# Patient Record
Sex: Male | Born: 1935 | Race: White | Hispanic: No | Marital: Married | Smoking: Former smoker
Health system: Southern US, Community
[De-identification: ages and names within clinical notes are randomized; demographics above are authoritative.]

## PROBLEM LIST (undated history)

## (undated) DIAGNOSIS — M1990 Unspecified osteoarthritis, unspecified site: Secondary | ICD-10-CM

## (undated) DIAGNOSIS — K219 Gastro-esophageal reflux disease without esophagitis: Secondary | ICD-10-CM

## (undated) DIAGNOSIS — Z950 Presence of cardiac pacemaker: Secondary | ICD-10-CM

## (undated) DIAGNOSIS — J449 Chronic obstructive pulmonary disease, unspecified: Secondary | ICD-10-CM

## (undated) DIAGNOSIS — I2510 Atherosclerotic heart disease of native coronary artery without angina pectoris: Secondary | ICD-10-CM

## (undated) DIAGNOSIS — N400 Enlarged prostate without lower urinary tract symptoms: Secondary | ICD-10-CM

## (undated) DIAGNOSIS — D509 Iron deficiency anemia, unspecified: Secondary | ICD-10-CM

## (undated) DIAGNOSIS — I472 Ventricular tachycardia: Secondary | ICD-10-CM

## (undated) DIAGNOSIS — I739 Peripheral vascular disease, unspecified: Secondary | ICD-10-CM

## (undated) DIAGNOSIS — I5022 Chronic systolic (congestive) heart failure: Secondary | ICD-10-CM

## (undated) DIAGNOSIS — I4892 Unspecified atrial flutter: Secondary | ICD-10-CM

## (undated) DIAGNOSIS — N189 Chronic kidney disease, unspecified: Secondary | ICD-10-CM

## (undated) DIAGNOSIS — I219 Acute myocardial infarction, unspecified: Secondary | ICD-10-CM

## (undated) DIAGNOSIS — I10 Essential (primary) hypertension: Secondary | ICD-10-CM

## (undated) DIAGNOSIS — Z86010 Personal history of colonic polyps: Secondary | ICD-10-CM

## (undated) DIAGNOSIS — I4891 Unspecified atrial fibrillation: Secondary | ICD-10-CM

## (undated) DIAGNOSIS — I4729 Other ventricular tachycardia: Secondary | ICD-10-CM

## (undated) DIAGNOSIS — E785 Hyperlipidemia, unspecified: Secondary | ICD-10-CM

## (undated) DIAGNOSIS — R0602 Shortness of breath: Secondary | ICD-10-CM

## (undated) DIAGNOSIS — Z860101 Personal history of adenomatous and serrated colon polyps: Secondary | ICD-10-CM

## (undated) DIAGNOSIS — Z9889 Other specified postprocedural states: Secondary | ICD-10-CM

## (undated) HISTORY — DX: Essential (primary) hypertension: I10

## (undated) HISTORY — DX: Iron deficiency anemia, unspecified: D50.9

## (undated) HISTORY — DX: Chronic obstructive pulmonary disease, unspecified: J44.9

## (undated) HISTORY — DX: Benign prostatic hyperplasia without lower urinary tract symptoms: N40.0

## (undated) HISTORY — DX: Unspecified atrial fibrillation: I48.91

## (undated) HISTORY — DX: Unspecified atrial flutter: I48.92

## (undated) HISTORY — DX: Peripheral vascular disease, unspecified: I73.9

## (undated) HISTORY — DX: Other specified postprocedural states: Z98.890

## (undated) HISTORY — DX: Presence of cardiac pacemaker: Z95.0

## (undated) HISTORY — DX: Shortness of breath: R06.02

## (undated) HISTORY — DX: Hyperlipidemia, unspecified: E78.5

## (undated) HISTORY — PX: CATARACT EXTRACTION: SUR2

## (undated) HISTORY — PX: SALIVARY GLAND SURGERY: SHX768

## (undated) HISTORY — DX: Gastro-esophageal reflux disease without esophagitis: K21.9

## (undated) HISTORY — DX: Personal history of adenomatous and serrated colon polyps: Z86.0101

## (undated) HISTORY — DX: Chronic systolic (congestive) heart failure: I50.22

## (undated) HISTORY — PX: CORONARY ANGIOPLASTY WITH STENT PLACEMENT: SHX49

## (undated) HISTORY — DX: Atherosclerotic heart disease of native coronary artery without angina pectoris: I25.10

## (undated) HISTORY — DX: Acute myocardial infarction, unspecified: I21.9

## (undated) HISTORY — DX: Personal history of colonic polyps: Z86.010

---

## 1992-04-05 DIAGNOSIS — I219 Acute myocardial infarction, unspecified: Secondary | ICD-10-CM

## 1992-04-05 HISTORY — DX: Acute myocardial infarction, unspecified: I21.9

## 1997-07-04 ENCOUNTER — Inpatient Hospital Stay (HOSPITAL_COMMUNITY): Admission: EM | Admit: 1997-07-04 | Discharge: 1997-07-07 | Payer: Self-pay | Admitting: Cardiology

## 1997-09-25 ENCOUNTER — Other Ambulatory Visit: Admission: RE | Admit: 1997-09-25 | Discharge: 1997-09-25 | Payer: Self-pay | Admitting: Cardiology

## 1998-07-02 ENCOUNTER — Ambulatory Visit (HOSPITAL_COMMUNITY): Admission: RE | Admit: 1998-07-02 | Discharge: 1998-07-02 | Payer: Self-pay | Admitting: Cardiology

## 1998-09-23 ENCOUNTER — Encounter: Payer: Self-pay | Admitting: Emergency Medicine

## 1998-09-23 ENCOUNTER — Emergency Department (HOSPITAL_COMMUNITY): Admission: EM | Admit: 1998-09-23 | Discharge: 1998-09-23 | Payer: Self-pay | Admitting: Emergency Medicine

## 1999-01-05 ENCOUNTER — Inpatient Hospital Stay (HOSPITAL_COMMUNITY): Admission: AD | Admit: 1999-01-05 | Discharge: 1999-01-08 | Payer: Self-pay | Admitting: Cardiology

## 1999-01-05 ENCOUNTER — Encounter: Payer: Self-pay | Admitting: Cardiology

## 1999-01-27 ENCOUNTER — Encounter: Payer: Self-pay | Admitting: Cardiology

## 1999-01-27 ENCOUNTER — Encounter: Admission: RE | Admit: 1999-01-27 | Discharge: 1999-01-27 | Payer: Self-pay | Admitting: Cardiology

## 1999-01-31 ENCOUNTER — Emergency Department (HOSPITAL_COMMUNITY): Admission: EM | Admit: 1999-01-31 | Discharge: 1999-01-31 | Payer: Self-pay | Admitting: Emergency Medicine

## 1999-01-31 ENCOUNTER — Encounter: Payer: Self-pay | Admitting: Emergency Medicine

## 1999-02-04 ENCOUNTER — Ambulatory Visit (HOSPITAL_COMMUNITY): Admission: RE | Admit: 1999-02-04 | Discharge: 1999-02-04 | Payer: Self-pay | Admitting: Cardiology

## 2000-01-21 ENCOUNTER — Other Ambulatory Visit: Admission: RE | Admit: 2000-01-21 | Discharge: 2000-01-21 | Payer: Self-pay | Admitting: Otolaryngology

## 2001-06-29 ENCOUNTER — Emergency Department (HOSPITAL_COMMUNITY): Admission: EM | Admit: 2001-06-29 | Discharge: 2001-06-30 | Payer: Self-pay

## 2002-02-13 ENCOUNTER — Encounter: Payer: Self-pay | Admitting: Gastroenterology

## 2002-03-23 ENCOUNTER — Encounter: Payer: Self-pay | Admitting: Emergency Medicine

## 2002-03-23 ENCOUNTER — Encounter: Admission: RE | Admit: 2002-03-23 | Discharge: 2002-03-23 | Payer: Self-pay | Admitting: Emergency Medicine

## 2002-05-21 ENCOUNTER — Encounter: Payer: Self-pay | Admitting: Emergency Medicine

## 2002-05-21 ENCOUNTER — Ambulatory Visit (HOSPITAL_COMMUNITY): Admission: RE | Admit: 2002-05-21 | Discharge: 2002-05-21 | Payer: Self-pay | Admitting: Emergency Medicine

## 2002-08-13 ENCOUNTER — Encounter: Payer: Self-pay | Admitting: Gastroenterology

## 2002-08-17 ENCOUNTER — Emergency Department (HOSPITAL_COMMUNITY): Admission: EM | Admit: 2002-08-17 | Discharge: 2002-08-18 | Payer: Self-pay | Admitting: Emergency Medicine

## 2002-08-18 ENCOUNTER — Encounter: Payer: Self-pay | Admitting: Emergency Medicine

## 2003-03-12 ENCOUNTER — Encounter (INDEPENDENT_AMBULATORY_CARE_PROVIDER_SITE_OTHER): Payer: Self-pay | Admitting: *Deleted

## 2003-03-12 ENCOUNTER — Encounter: Admission: RE | Admit: 2003-03-12 | Discharge: 2003-03-12 | Payer: Self-pay | Admitting: General Surgery

## 2005-02-17 ENCOUNTER — Ambulatory Visit: Payer: Self-pay | Admitting: Gastroenterology

## 2005-04-15 ENCOUNTER — Ambulatory Visit: Payer: Self-pay | Admitting: Gastroenterology

## 2005-04-30 ENCOUNTER — Ambulatory Visit: Payer: Self-pay | Admitting: Gastroenterology

## 2005-04-30 ENCOUNTER — Encounter (INDEPENDENT_AMBULATORY_CARE_PROVIDER_SITE_OTHER): Payer: Self-pay | Admitting: Specialist

## 2005-05-04 ENCOUNTER — Inpatient Hospital Stay (HOSPITAL_COMMUNITY): Admission: EM | Admit: 2005-05-04 | Discharge: 2005-05-08 | Payer: Self-pay | Admitting: Emergency Medicine

## 2006-03-21 ENCOUNTER — Ambulatory Visit: Payer: Self-pay | Admitting: Gastroenterology

## 2006-04-05 HISTORY — PX: CARDIAC CATHETERIZATION: SHX172

## 2006-11-18 ENCOUNTER — Inpatient Hospital Stay (HOSPITAL_COMMUNITY): Admission: EM | Admit: 2006-11-18 | Discharge: 2006-11-21 | Payer: Self-pay | Admitting: Emergency Medicine

## 2006-11-19 ENCOUNTER — Encounter (INDEPENDENT_AMBULATORY_CARE_PROVIDER_SITE_OTHER): Payer: Self-pay | Admitting: *Deleted

## 2006-11-20 ENCOUNTER — Encounter (INDEPENDENT_AMBULATORY_CARE_PROVIDER_SITE_OTHER): Payer: Self-pay | Admitting: *Deleted

## 2006-11-30 ENCOUNTER — Ambulatory Visit: Payer: Self-pay | Admitting: Internal Medicine

## 2006-12-22 ENCOUNTER — Encounter: Admission: RE | Admit: 2006-12-22 | Discharge: 2006-12-22 | Payer: Self-pay | Admitting: Cardiology

## 2007-03-15 ENCOUNTER — Ambulatory Visit: Payer: Self-pay | Admitting: Gastroenterology

## 2007-03-25 ENCOUNTER — Emergency Department (HOSPITAL_COMMUNITY): Admission: EM | Admit: 2007-03-25 | Discharge: 2007-03-26 | Payer: Self-pay | Admitting: Emergency Medicine

## 2007-03-27 ENCOUNTER — Encounter: Admission: RE | Admit: 2007-03-27 | Discharge: 2007-03-27 | Payer: Self-pay | Admitting: Cardiology

## 2007-03-29 ENCOUNTER — Emergency Department (HOSPITAL_COMMUNITY): Admission: EM | Admit: 2007-03-29 | Discharge: 2007-03-29 | Payer: Self-pay | Admitting: Family Medicine

## 2007-04-04 DIAGNOSIS — Z860100 Personal history of colon polyps, unspecified: Secondary | ICD-10-CM | POA: Insufficient documentation

## 2007-04-04 DIAGNOSIS — K219 Gastro-esophageal reflux disease without esophagitis: Secondary | ICD-10-CM

## 2007-04-05 ENCOUNTER — Emergency Department (HOSPITAL_COMMUNITY): Admission: EM | Admit: 2007-04-05 | Discharge: 2007-04-05 | Payer: Self-pay | Admitting: Emergency Medicine

## 2007-09-11 ENCOUNTER — Encounter: Admission: RE | Admit: 2007-09-11 | Discharge: 2007-09-11 | Payer: Self-pay | Admitting: Cardiology

## 2008-04-09 ENCOUNTER — Emergency Department (HOSPITAL_COMMUNITY): Admission: EM | Admit: 2008-04-09 | Discharge: 2008-04-09 | Payer: Self-pay | Admitting: Emergency Medicine

## 2008-07-24 ENCOUNTER — Encounter: Admission: RE | Admit: 2008-07-24 | Discharge: 2008-07-24 | Payer: Self-pay | Admitting: Neurological Surgery

## 2008-08-31 ENCOUNTER — Ambulatory Visit: Payer: Self-pay | Admitting: Pulmonary Disease

## 2008-08-31 ENCOUNTER — Ambulatory Visit: Payer: Self-pay | Admitting: Cardiovascular Disease

## 2008-08-31 ENCOUNTER — Ambulatory Visit: Payer: Self-pay | Admitting: Family Medicine

## 2008-08-31 ENCOUNTER — Inpatient Hospital Stay (HOSPITAL_COMMUNITY): Admission: EM | Admit: 2008-08-31 | Discharge: 2008-09-04 | Payer: Self-pay | Admitting: Emergency Medicine

## 2008-09-03 ENCOUNTER — Encounter: Payer: Self-pay | Admitting: Family Medicine

## 2008-10-30 ENCOUNTER — Encounter: Admission: RE | Admit: 2008-10-30 | Discharge: 2008-10-30 | Payer: Self-pay | Admitting: Emergency Medicine

## 2009-01-10 ENCOUNTER — Encounter: Admission: RE | Admit: 2009-01-10 | Discharge: 2009-01-10 | Payer: Self-pay | Admitting: Family Medicine

## 2009-03-31 ENCOUNTER — Encounter: Admission: RE | Admit: 2009-03-31 | Discharge: 2009-03-31 | Payer: Self-pay | Admitting: Cardiology

## 2009-05-05 ENCOUNTER — Encounter (INDEPENDENT_AMBULATORY_CARE_PROVIDER_SITE_OTHER): Payer: Self-pay | Admitting: *Deleted

## 2009-05-05 ENCOUNTER — Ambulatory Visit: Payer: Self-pay | Admitting: Gastroenterology

## 2009-05-05 DIAGNOSIS — D649 Anemia, unspecified: Secondary | ICD-10-CM

## 2009-05-05 LAB — CONVERTED CEMR LAB
Ferritin: 62.7 ng/mL (ref 22.0–322.0)
Folate: >20 ng/mL
Transferrin: 149.2 mg/dL — ABNORMAL LOW (ref 212.0–360.0)

## 2009-05-06 ENCOUNTER — Encounter: Payer: Self-pay | Admitting: Gastroenterology

## 2009-05-19 ENCOUNTER — Ambulatory Visit: Payer: Self-pay | Admitting: Gastroenterology

## 2009-05-19 ENCOUNTER — Other Ambulatory Visit: Admission: RE | Admit: 2009-05-19 | Discharge: 2009-05-19 | Payer: Self-pay | Admitting: Gastroenterology

## 2009-05-22 ENCOUNTER — Encounter: Payer: Self-pay | Admitting: Gastroenterology

## 2009-05-28 ENCOUNTER — Ambulatory Visit: Payer: Self-pay | Admitting: Gastroenterology

## 2009-05-30 LAB — CONVERTED CEMR LAB
OCCULT 1: NEGATIVE
OCCULT 3: NEGATIVE
OCCULT 5: NEGATIVE

## 2009-10-30 ENCOUNTER — Ambulatory Visit: Payer: Self-pay | Admitting: Vascular Surgery

## 2009-10-30 ENCOUNTER — Emergency Department (HOSPITAL_COMMUNITY): Admission: EM | Admit: 2009-10-30 | Discharge: 2009-10-30 | Payer: Self-pay | Admitting: Emergency Medicine

## 2009-10-30 ENCOUNTER — Encounter (INDEPENDENT_AMBULATORY_CARE_PROVIDER_SITE_OTHER): Payer: Self-pay | Admitting: Emergency Medicine

## 2009-11-28 ENCOUNTER — Encounter: Payer: Self-pay | Admitting: Internal Medicine

## 2009-12-02 ENCOUNTER — Ambulatory Visit: Payer: Self-pay | Admitting: Internal Medicine

## 2009-12-02 DIAGNOSIS — R0600 Dyspnea, unspecified: Secondary | ICD-10-CM | POA: Insufficient documentation

## 2009-12-02 DIAGNOSIS — J4489 Other specified chronic obstructive pulmonary disease: Secondary | ICD-10-CM | POA: Insufficient documentation

## 2009-12-02 DIAGNOSIS — J449 Chronic obstructive pulmonary disease, unspecified: Secondary | ICD-10-CM

## 2009-12-02 DIAGNOSIS — Z862 Personal history of diseases of the blood and blood-forming organs and certain disorders involving the immune mechanism: Secondary | ICD-10-CM

## 2009-12-02 DIAGNOSIS — Z8639 Personal history of other endocrine, nutritional and metabolic disease: Secondary | ICD-10-CM

## 2009-12-02 LAB — CONVERTED CEMR LAB
AST: 249 units/L — ABNORMAL HIGH (ref 0–37)
BUN: 17 mg/dL (ref 6–23)
Basophils Absolute: 0 10*3/uL (ref 0.0–0.1)
Basophils Relative: 0.4 % (ref 0.0–3.0)
CO2: 27 meq/L (ref 19–32)
Calcium: 8.5 mg/dL (ref 8.4–10.5)
Chloride: 101 meq/L (ref 96–112)
Eosinophils Absolute: 2.6 10*3/uL — ABNORMAL HIGH (ref 0.0–0.7)
Glucose, Bld: 86 mg/dL (ref 70–99)
Hemoglobin: 11.4 g/dL — ABNORMAL LOW (ref 13.0–17.0)
Lymphocytes Relative: 7.3 % — ABNORMAL LOW (ref 12.0–46.0)
Monocytes Absolute: 0.9 10*3/uL (ref 0.1–1.0)
Neutrophils Relative %: 58.2 % (ref 43.0–77.0)
Platelets: 287 10*3/uL (ref 150.0–400.0)
Potassium: 4.8 meq/L (ref 3.5–5.1)
Pro B Natriuretic peptide (BNP): 251.6 pg/mL — ABNORMAL HIGH (ref 0.0–100.0)
Sed Rate: 74 mm/hr — ABNORMAL HIGH (ref 0–22)
TSH: 3.76 microintl units/mL (ref 0.35–5.50)
Total Bilirubin: 0.6 mg/dL (ref 0.3–1.2)
Total Protein: 6.2 g/dL (ref 6.0–8.3)
WBC: 10.1 10*3/uL (ref 4.5–10.5)

## 2009-12-04 ENCOUNTER — Encounter: Admission: RE | Admit: 2009-12-04 | Discharge: 2009-12-04 | Payer: Self-pay | Admitting: Cardiology

## 2009-12-09 ENCOUNTER — Ambulatory Visit: Payer: Self-pay | Admitting: Cardiology

## 2009-12-11 ENCOUNTER — Ambulatory Visit: Payer: Self-pay | Admitting: Cardiology

## 2009-12-17 ENCOUNTER — Ambulatory Visit: Payer: Self-pay | Admitting: Cardiology

## 2009-12-18 ENCOUNTER — Ambulatory Visit (HOSPITAL_COMMUNITY): Admission: RE | Admit: 2009-12-18 | Discharge: 2009-12-18 | Payer: Self-pay | Admitting: Cardiology

## 2009-12-18 ENCOUNTER — Ambulatory Visit: Payer: Self-pay | Admitting: Cardiology

## 2009-12-29 ENCOUNTER — Ambulatory Visit: Payer: Self-pay | Admitting: Cardiology

## 2010-01-12 ENCOUNTER — Ambulatory Visit: Payer: Self-pay | Admitting: Internal Medicine

## 2010-01-12 DIAGNOSIS — R930 Abnormal findings on diagnostic imaging of skull and head, not elsewhere classified: Secondary | ICD-10-CM | POA: Insufficient documentation

## 2010-01-19 ENCOUNTER — Emergency Department (HOSPITAL_COMMUNITY): Admission: EM | Admit: 2010-01-19 | Discharge: 2010-01-20 | Payer: Self-pay | Admitting: Emergency Medicine

## 2010-02-10 ENCOUNTER — Ambulatory Visit: Payer: Self-pay | Admitting: Cardiology

## 2010-03-06 ENCOUNTER — Inpatient Hospital Stay (HOSPITAL_COMMUNITY)
Admission: EM | Admit: 2010-03-06 | Discharge: 2010-03-10 | Payer: Self-pay | Source: Home / Self Care | Admitting: Emergency Medicine

## 2010-03-06 ENCOUNTER — Ambulatory Visit: Payer: Self-pay | Admitting: Internal Medicine

## 2010-03-08 ENCOUNTER — Encounter: Payer: Self-pay | Admitting: Family Medicine

## 2010-03-09 ENCOUNTER — Encounter (INDEPENDENT_AMBULATORY_CARE_PROVIDER_SITE_OTHER): Payer: Self-pay | Admitting: Urology

## 2010-03-09 ENCOUNTER — Encounter: Payer: Self-pay | Admitting: Family Medicine

## 2010-03-13 ENCOUNTER — Ambulatory Visit: Payer: Self-pay | Admitting: Cardiovascular Disease

## 2010-04-24 ENCOUNTER — Ambulatory Visit
Admission: RE | Admit: 2010-04-24 | Discharge: 2010-04-24 | Payer: Self-pay | Source: Home / Self Care | Attending: Internal Medicine | Admitting: Internal Medicine

## 2010-04-30 ENCOUNTER — Ambulatory Visit: Payer: Self-pay | Admitting: Cardiovascular Disease

## 2010-05-05 ENCOUNTER — Encounter: Payer: Self-pay | Admitting: Cardiology

## 2010-05-05 NOTE — Miscellaneous (Signed)
Summary: Orders Update pft charges  Clinical Lists Changes  Orders: Added new Service order of Carbon Monoxide diffusing w/capacity (94720) - Signed Added new Service order of Lung Volumes (94240) - Signed Added new Service order of Spirometry (Pre & Post) (94060) - Signed 

## 2010-05-05 NOTE — Assessment & Plan Note (Signed)
Summary: Pulmnary/ ext summary f/u ov with HFA 75% p coaching   Copy to:  Dr. Nadyne Coombes Primary Provider/Referring Provider:  Nadyne Coombes, MD  CC:  Follow up with PFTs.  Pt was seen by PCP approx 3 wk ago - was told he had "early stages of PNA" and was given steroid and abx.  States breathing "comes and goes."  prod cough with yellow mucus.  .  History of Present Illness: 77 yowm quit smoking October 2010 at wt= around 200  with some am cough and congestion x 5-10 min maybe a little better after inhalers but breathing gradually worse since quit smoking.   December 02, 2009 cc doe x one month much worse to point where needs to stop when gets back to house to mailbox also with increase leg swelling so increase furosemide to 40 and 20 at night > no better.  no cough. LFT's and esr were elevated so rx with advair and dc amio  January 12, 2010 Follow up with PFTs.  Pt was seen by PCP approx 3 wk ago - was told he had "early stages of PNA" and was given steroid and abx.  States breathing "comes and goes."  prod cough with yellow mucus.   overally breathing better but feels he's on too many meds.  Pt denies any significant sore throat, dysphagia, itching, sneezing,  nasal congestion or excess secretions,  fever, chills, sweats, unintended wt loss, pleuritic or exertional cp, hempoptysis,   orthopnea pnd or leg swelling. Pt also denies any obvious fluctuation in symptoms with weather or environmental change or other alleviating or aggravating factors.       Current Medications (verified): 1)  Advair Diskus 250-50 Mcg/dose Aepb (Fluticasone-Salmeterol) .... Take 1 Puff By Mouth Two Times A Day 2)  Nexium 40 Mg Cpdr (Esomeprazole Magnesium) .... Take 1 Tablet By Mouth Two Times A Day 3)  Aspirin 81 Mg Tbec (Aspirin) .... Take 1 Tablet By Mouth Once A Day 4)  Plavix 75 Mg Tabs (Clopidogrel Bisulfate) .... Take 1 Tablet By Mouth Every Other Day 5)  Flomax 0.4 Mg Caps (Tamsulosin Hcl) .... Take 1 Tablet  By Mouth Once Daily 6)  Furosemide 40 Mg Tabs (Furosemide) .[name].. 1 in Morning and 1/2 At Night 7)  Prenavite Multiple Vitamin 28-0.8 Mg Tabs (Prenatal Vit-Fe Fumarate-Fa) .... Take 1 Tablet By Mouth Once A Day 8)  Alprazolam 0.25 Mg Tabs (Alprazolam) .... Take 1 Tablet By Mouth As Needed 9)  Milk of Magnesia (Unknown Dosage) .... Take 3 Tablespoons By Mouth Twice Per Week As Needed 10)  Gas-X Extra Strength 125 Mg Caps (Simethicone) .... Take As Needed For Gas 11)  Vicodin 5-500 Mg Tabs (Hydrocodone-Acetaminophen) .... Take 1 Tablet By Mouth Once A Day 12)  Tylenol Arthritis Pain 650 Mg Cr-Tabs (Acetaminophen) .... Take 2 Tablets By Mouth Once Daily As Needed 13)  Proair Hfa 108 (90 Base) Mcg/act Aers (Albuterol Sulfate) .... 2 Puffs Every 4-6 Hrs As Needed 14)  Albuterol Sulfate (2.5 Mg/79ml) 0.083% Nebu (Albuterol Sulfate) .... Four Times Daily 15)  Atrovent 0.03 % Soln (Ipratropium Bromide) .... Four Times Daily 16)  Diovan 160 Mg Tabs (Valsartan) .... Take 1 Tablet By Mouth Once A Day  Allergies (verified): 1)  ! Sulfa  Past History:  Past Medical History: GERD (ICD-530.81) w/ LPR COPD...................................................Marland KitchenWert      - PFT's January 12, 2010 FEV1  1.27 (44%) and ratio 58 no better p b2 and DLCO 43%       -  HFA 75% p coaching January 12, 2010  Ischemic heart disease Myocardial Infarction x 2 Atrial Fibrillation     - On amiodarone December 02, 2009 with ? ILD and ESR 74 >> stop amiodarone 12/2009 Degenerative joint disease Hemorrhoids Hyperlipidemia Hypertension Arrhythmia Adenomatous Colon Polyps 02/2002  Vital Signs:  Patient profile:   77 year old male Height:      72 inches Weight:      203 pounds BMI:     27.63 O2 Sat:      93 % on Room air Temp:     98.0 degrees F oral Pulse rate:   76 / minute BP sitting:   128 / 60  (right arm) Cuff size:   regular  Vitals Entered By: Gweneth Dimitri RN (January 12, 2010 11:41 AM)  O2 Flow:  Room  air CC: Follow up with PFTs.  Pt was seen by PCP approx 3 wk ago - was told he had "early stages of PNA" and was given steroid and abx.  States breathing "comes and goes."  prod cough with yellow mucus.   Comments Medications reviewed with patient Daytime contact number verified with patient. Gweneth Dimitri RN  January 12, 2010 11:41 AM    Physical Exam  Additional Exam:  wt 205 > 207  December 02, 2009 > 203 January 12, 2010  amb mod bareel  chest wm nad at rest HEENT mild turbinate edema.  Oropharynx no thrush or excess pnd or cobblestoning.  No JVD or cervical adenopathy. Mild accessory muscle hypertrophy. Trachea midline, nl thryroid. Chest was hyperinflated by percussion with diminished breath sounds and moderate increased exp time without wheeze. Hoover sign positive at mid inspiration. Regular rate and rhythm without murmur gallop or rub or increase P2, trace edema.  Abd: no hsm, nl excursion. Ext warm without cyanosis or clubbing.     Impression & Recommendations:  Problem # 1:  COPD UNSPECIFIED (ICD-496) GOLD  IV with freq exac on advair    DDX of  difficult airways managment all start with A and  include Adherence, Ace Inhibitors, Acid Reflux, Active Sinus Disease, Alpha 1 Antitripsin deficiency, Anxiety masquerading as Airways dz,  ABPA,  allergy(esp in young), Aspiration (esp in elderly), Adverse effects of DPI,  Active smokers, plus one B  = Beta blocker use..    ? adverse effect of dpi;  try symbicort 160 2 puffs first thing  in am and 2 puffs again in pm about 12 hours later ? GERD  reviewed ppi/ diet ? adherence: I spent extra time with the patient today explaining optimal mdi  technique.  This improved from  50-75%   Each maintenance medication was reviewed in detail including most importantly the difference between maintenance prns and under what circumstances the prns are to be used.  In addition, these two groups (for which the patient should keep up with refills) were  distinguished from a third group :  meds that are used only short term with the intent to complete a course of therapy and then not refill them.  The med list was then fully reconciled and reorganized to reflect this important distinction.   Problem # 2:  ABNORMAL LUNG XRAY (ICD-793.1)  CXR c/w mild amio toxicity and now off it - recheck next ov  Orders: Est. Patient Level IV (98119)  Medications Added to Medication List This Visit: 1)  Symbicort 160-4.5 Mcg/act Aero (Budesonide-formoterol fumarate) .... 2 puffs first thing  in am and 2 puffs again in pm  about 12 hours later 2)  Nexium 40 Mg Cpdr (Esomeprazole magnesium) .... Take one 30-60 min before first and last meals of the day 3)  Diovan 160 Mg Tabs (Valsartan) .... Take 1 tablet by mouth once a day 4)  Furosemide 40 Mg Tabs (Furosemide) .[name].. 1 in morning and 1/2 at night 5)  Albuterol Sulfate (2.5 Mg/75ml) 0.083% Nebu (Albuterol sulfate) .... Four times daily 6)  Atrovent 0.03 % Soln (Ipratropium bromide) .... Four times daily  Patient Instructions: 1)  stop advair 2)  Start symbicort 160 2 puffs first thing  in am and 2 puffs again in pm about 12 hours later  3)  Work on inhaler technique:  relax and blow all the way out then take a nice smooth deep breath back in, triggering the inhaler at same time you start breathing in hold for a few seconds then rinse and gargle. 4)  If you like symbicort fill  the prescription - you should notice less need for the other breathing treatments which can be used as neeed 5)   Think of your medications in 3 separate categories and keep them separate:  6)  a)  The ones you take no matter what daily on a scheduled basis according the time a day 7)  b) The ones you only take if needed for specific problems 8)  c) The ones you take for a short course and stop, like antibiotics and prednisone. 9)  Return to office in 3 months, sooner if needed  10)  ADD needs cxr next ov 11)     Prescriptions: SYMBICORT 160-4.5 MCG/ACT  AERO (BUDESONIDE-FORMOTEROL FUMARATE) 2 puffs first thing  in am and 2 puffs again in pm about 12 hours later  #3 x 3   Entered and Authorized by:   Nyoka Cowden MD   Signed by:   Nyoka Cowden MD on 01/12/2010   Method used:   Print then Give to Patient   RxID:   [id number]

## 2010-05-05 NOTE — Letter (Signed)
Summary: [facility] Instructions  [facility]  [address] [place], [place] [address]   Phone: [phone]  Fax: [phone]       BRYCE CHEEVER    May 24, 1930    MRN: [id number]        Procedure Day /Date: Monday February 14th, 2011     Arrival Time: 1:30pm     Procedure Time: 2:30pm     Location of Procedure:                    _ x_  [facility] (4th Floor)                        PREPARATION FOR COLONOSCOPY WITH MOVIPREP   Starting 5 days prior to your procedure 05/14/09  do not eat nuts, seeds, popcorn, corn, beans, peas,  salads, or any raw vegetables.  Do not take any fiber supplements (e.g. Metamucil, Citrucel, and Benefiber).  THE DAY BEFORE YOUR PROCEDURE         DATE:  05/18/09   DAY:   Sunday  1.  Drink clear liquids the entire day-NO SOLID FOOD  2.  Do not drink anything colored red or purple.  Avoid juices with pulp.  No orange juice.  3.  Drink at least 64 oz. (8 glasses) of fluid/clear liquids during the day to prevent dehydration and help the prep work efficiently.  CLEAR LIQUIDS INCLUDE: Water Jello Ice Popsicles Tea (sugar ok, no milk/cream) Powdered fruit flavored drinks Coffee (sugar ok, no milk/cream) Gatorade Juice: apple, white grape, white cranberry  Lemonade Clear bullion, consomm, broth Carbonated beverages (any kind) Strained chicken noodle soup Hard Candy                             4.  In the morning, mix first dose of MoviPrep solution:    Empty 1 Pouch A and 1 Pouch B into the disposable container    Add lukewarm drinking water to the top line of the container. Mix to dissolve    Refrigerate (mixed solution should be used within 24 hrs)  5.  Begin drinking the prep at 5:00 p.m. The MoviPrep container is divided by 4 marks.   Every 15 minutes drink the solution down to the next mark (approximately 8 oz) until the full liter is complete.   6.  Follow completed prep with 16 oz of clear liquid of your  choice (Nothing red or purple).  Continue to drink clear liquids until bedtime.  7.  Before going to bed, mix second dose of MoviPrep solution:    Empty 1 Pouch A and 1 Pouch B into the disposable container    Add lukewarm drinking water to the top line of the container. Mix to dissolve    Refrigerate  THE DAY OF YOUR PROCEDURE      DATE:  05/19/09  DAY:  Monday  Beginning at  9:30 a.m. (5 hours before procedure):         1. Every 15 minutes, drink the solution down to the next mark (approx 8 oz) until the full liter is complete.  2. Follow completed prep with 16 oz. of clear liquid of your choice.    3. You may drink clear liquids until   12:30pm (2 HOURS BEFORE PROCEDURE).   MEDICATION INSTRUCTIONS  Unless otherwise instructed, you should take regular prescription medications with a small sip of  water   as early as possible the morning of your procedure.  Diabetic patients - see separate instructions.  Stop taking Plavix or Aggrenox on  _  _  (7 days before procedure).     Stop taking Coumadin on  _ _  (5 days before procedure).  Additional medication instructions: You will be contacted by our office prior to your procedure for directions on holding your Plavix.  If you do not hear from our office 1 week prior to your scheduled procedure, please call [phone] to discuss.          OTHER INSTRUCTIONS  You will need a responsible adult at least 74 years of age to accompany you and drive you home.   This person must remain in the waiting room during your procedure.  Wear loose fitting clothing that is easily removed.  Leave jewelry and other valuables at home.  However, you may wish to bring a book to read or  an iPod/MP3 player to listen to music as you wait for your procedure to start.  Remove all body piercing jewelry and leave at home.  Total time from sign-in until discharge is approximately 2-3 hours.  You should go home directly after your procedure and  rest.  You can resume normal activities the  day after your procedure.  The day of your procedure you should not:   Drive   Make legal decisions   Operate machinery   Drink alcohol   Return to work  You will receive specific instructions about eating, activities and medications before you leave.    The above instructions have been reviewed and explained to me by   _______________________    I fully understand and can verbalize these instructions _____________________________ Date _________

## 2010-05-05 NOTE — Procedures (Signed)
Summary: HOLD/[facility]  HOLD/[facility]   Imported By: Lester Belleair 05/12/2009 07:17:45  _____________________________________________________________________  External Attachment:    Type:   Image     Comment:   External Document

## 2010-05-05 NOTE — Procedures (Signed)
Summary: Colonoscopy   Colonoscopy  Procedure date:  02/13/2002  Findings:      Results: Hemorrhoids.     Pathology:  Adenomatous polyp.        Location:  [facility].    Procedures Next Due Date:    Colonoscopy: 02/2005  Colonoscopy  Procedure date:  02/13/2002  Findings:      Results: Hemorrhoids.     Pathology:  Adenomatous polyp.        Location:  [facility].    Procedures Next Due Date:    Colonoscopy: 02/2005 Patient Name: Andrew [name], Andrew [name]. MRN:  Procedure Procedures: Colonoscopy CPT: 2390621056.    with Hot Biopsy(s)CPT: [id number].  Personnel: Endoscopist: Venita Lick. Russella Dar, MD, Clementeen Graham.  Exam Location: Exam performed in Outpatient Clinic. Outpatient  Patient Consent: Procedure, Alternatives, Risks and Benefits discussed, consent obtained, from patient. Consent was obtained by the RN.  Indications Symptoms: Constipation Hematochezia. Abdominal pain / bloating. Change in bowel habits.  History  Pre-Exam Physical: Performed Feb 13, 2002. Entire physical exam was normal.  Exam Exam: Extent of exam reached: Cecum, extent intended: Cecum.  The cecum was identified by appendiceal orifice and IC valve. Colon retroflexion performed. ASA Classification: III. Tolerance: good.  Monitoring: Pulse and BP monitoring, Oximetry used. Supplemental O2 given.  Colon Prep Used Golytely for colon prep. Prep results: good.  Sedation Meds: Patient assessed and found to be appropriate for moderate (conscious) sedation. Fentanyl 100 mcg. given IV. Versed 8 mg. given IV.  Comments: tortuous colon with spasm-moderately difficult procedure Findings NORMAL EXAM: Cecum to Transverse Colon.  MULTIPLE POLYPS: Splenic Flexure to Sigmoid Colon. minimum size 3 mm, maximum size 4 mm. Procedure:  hot biopsy, removed, Polyp retrieved, 5 polyps Polyps sent to pathology. ICD9: Liver Metastasis: 197.7.  HEMORRHOIDS: Internal. Size: Medium. Not bleeding. Not  thrombosed. ICD9: Hemorrhoids, Internal: 455.0.   Assessment  Diagnoses: 211.3: Colon Polyps.  455.0: Hemorrhoids, Internal.   Events  Unplanned Interventions: No intervention was required.  Unplanned Events: There were no complications. Plans  Post Exam Instructions: No aspirin or non-steroidal containing medications: 2 weeks.  Medication Plan: Await pathology. Continue current medications.  Patient Education: Patient given standard instructions for: Polyps. Hemorrhoids. Constipation. Disposition: After procedure patient sent to recovery. After recovery patient sent home.  Scheduling/Referral: Colonoscopy, to [facility] T. Russella Dar, MD, [facility], if polyp(s) adenomatous, around Feb 13, 2005.  Primary Care Provider,    This report was created from the original endoscopy report, which was reviewed and signed by the above listed endoscopist.    cc: Viviann Spare A. Cleta Alberts, MD

## 2010-05-05 NOTE — Procedures (Signed)
Summary: Upper Endoscopy  Patient: Andrew [name] Note: All result statuses are Final unless otherwise noted.  Tests: (1) Upper Endoscopy (EGD)   EGD Upper Endoscopy       DONE      [facility]     [name]     [place], [place]  [address]           ENDOSCOPY PROCEDURE REPORT           PATIENT:  Andrew [name], Andrew [name]  MR#:  [id number]     BIRTHDATE:  1930/10/29, 78 yrs. old  GENDER:  male           ENDOSCOPIST:  Judie Petit T. Russella Dar, MD, [facility]     Referred by:           PROCEDURE DATE:  05/19/2009     PROCEDURE:  EGD w/brushings     ASA CLASS:  Class II     INDICATIONS:  GERD, anemia           MEDICATIONS:  There was residual sedation effect present from     prior procedure, Versed 2 mg IV     TOPICAL ANESTHETIC:  Exactacain Spray           DESCRIPTION OF PROCEDURE:   After the risks benefits and     alternatives of the procedure were thoroughly explained, informed     consent was obtained.  The LB GIF-H180 [id number] endoscope was     introduced through the mouth and advanced to the second portion of     the duodenum, without limitations.  The instrument was slowly     withdrawn as the mucosa was fully examined.     <<PROCEDUREIMAGES>>           Eexudate in the mid esophagus. It was white colored. A brushing     for cytology was obtained. R/O candida. The remainder of the     esophagus was normal. The stomach was entered and closely     examined. The pylorus, antrum, angularis, and lesser curvature     were well visualized, including a retroflexed view of the cardia     and fundus. The stomach wall was normally distensable. The scope     passed easily through the pylorus into the duodenum.  The duodenal     bulb was normal in appearance, as was the postbulbar duodenum.     Retroflexed views revealed no abnormalities.  The scope was then     withdrawn from the patient and the procedure completed.           COMPLICATIONS:  None           ENDOSCOPIC IMPRESSION:     1)  Exudate in the mid esophagus           RECOMMENDATIONS:     1) await pathology results     2) continue PPI     3) anti-reflux regimen           Syvanna Ciolino T. Russella Dar, MD, Clementeen Graham           CC:  Nadyne Coombes, MD           n.     Rosalie DoctorVenita Lick. Taquana Bartley at 05/19/2009 03:53 PM           Georgianne Fick, [id number]  Note: An exclamation mark (!) indicates a result that was not dispersed into the flowsheet. Document Creation Date: 05/19/2009 3:53 PM _______________________________________________________________________  (1) Order result status:  Final Collection or observation date-time: 05/19/2009 15:48 Requested date-time:  Receipt date-time:  Reported date-time:  Referring Physician:   Ordering Physician: Claudette Head [phone]) Specimen Source:  Source: Launa Grill Order Number: [id number] Lab site:

## 2010-05-05 NOTE — Letter (Signed)
Summary: Patient [name] Biopsy Results  [facility]  [address] [place], [place] [address]   Phone: [phone]  Fax: [phone]        May 22, 2009 MRN: [id number]    Andrew [name] [address] [place], [place]  [address]    Dear [name],  I am pleased to inform you that the brushings taken during your recent endoscopic examination showed candida. Please complete the antibiotic prescribed to treat this infection.  Continue with the treatment plan as outlined on the day of your      exam.  Please call us if you are having persistent problems or have questions about your condition that have not been fully answered at this time.  Sincerely,  Meryl Dare MD [facility]  This letter has been electronically signed by your physician.  Appended Document: Patient Notice-Endo Biopsy Results Letter mailed 2.18.11

## 2010-05-05 NOTE — Procedures (Signed)
Summary: Colonoscopy  Patient: Andrew [name] Note: All result statuses are Final unless otherwise noted.  Tests: (1) Colonoscopy (COL)   COL Colonoscopy           DONE     [facility]     [name]     [place], [place]  [address]           COLONOSCOPY PROCEDURE REPORT           PATIENT:  Andrew [name], Andrew [name]  MR#:  [id number]     BIRTHDATE:  [birth year], 78 yrs. old  GENDER:  male           ENDOSCOPIST:  Judie Petit T. Russella Dar, MD, [facility]           PROCEDURE DATE:  05/19/2009     PROCEDURE:  Colonoscopy with snare polypectomy     ASA CLASS:  Class II     INDICATIONS:  1) anemia  2) follow-up of polyp, adenomatous polyp,     02/2002.           MEDICATIONS:   Fentanyl 100 mcg IV, Versed 10 mg IV           DESCRIPTION OF PROCEDURE:   After the risks benefits and     alternatives of the procedure were thoroughly explained, informed     consent was obtained.  Digital rectal exam was performed and     revealed no abnormalities.   The LB PCF-H180AL [id number] endoscope     was introduced through the anus and advanced to the cecum, which     was identified by both the appendix and ileocecal valve, limited     by a tortuous and redundant colon, fair prep.    The quality of     the prep was Moviprep fair.  The instrument was then slowly     withdrawn as the colon was fully examined.     <<PROCEDUREIMAGES>>           FINDINGS:  A sessile polyp was found in the cecum. It was 8 mm in     size. Polyp was snared, then cauterized with monopolar cautery.     Retrieval was successful. Piecemeal polypectomy appeared to be     complete.  A sessile polyp was found in the cecum. It was 15 mm in     size. Polyp was snared, then cauterized with monopolar cautery.     Retrieval was successful. Piecemeal polypectomy, not certain it     was complete.  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 It was non-bleeding. It was 5 mm in size. A sessile polyp     was found at the splenic flexure.  It was 6 mm in size. Polyp was     snared without cautery. Retrieval was successful. A sessile polyp     was found in the descending colon. It was 4 mm in size. Polyp was     snared without cautery. Retrieval was successful.  Moderate     diverticulosis was found sigmoid to descending  This was otherwise     a normal examination of the colon.   Retroflexed views in the     rectum revealed internal hemorrhoids, small.  The time to cecum =     16.5  minutes. The scope was then withdrawn (time =  15  min) from     the patient and the procedure completed.  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     4) High fiber diet           Andrew [name] T. Russella Dar, MD, Andrew [name]           CC: Andrew Coombes, MD           n.     Andrew [name]. Andrew [name] at 05/19/2009 03:43 PM           Andrew [name], Andrew [name]  Note: An exclamation mark (!) indicates a result that was not dispersed into the flowsheet. Document Creation Date: 05/19/2009 3:43 PM _______________________________________________________________________  (1) Order result status: Final Collection or observation date-time: 05/19/2009 15:36 Requested date-time:  Receipt date-time:  Reported date-time:  Referring Physician:   Ordering Physician: Claudette Head [phone]) Specimen Source:  Source: Launa Grill Order Number: [id number] Lab site:   Appended Document: Colonoscopy     Procedures Next Due Date:    Colonoscopy: 05/2010

## 2010-05-05 NOTE — Assessment & Plan Note (Signed)
Summary: Pulmonary consultation/ copd eval/ LFT's up   Visit Type:  Initial Consult Copy to:  Dr. Nadyne Coombes Primary Provider/Referring Provider:  Nadyne Coombes, MD  CC:  Dyspnea.  History of Present Illness: 77 yowm quit smoking October 2010 at wt= around 200  with some am cough and congestion x 5-10 min maybe a little better after inhalers but breathing gradually worse since quit smoking.   December 02, 2009 cc doe x one month much worse to point where needs to stop when gets back to house to mailbox also with increase leg swelling so increase furosemide to 40 and 20 at night > no better.  no cough. Pt denies any significant sore throat, dysphagia, itching, sneezing,  nasal congestion or excess secretions,  fever, chills, sweats, unintended wt loss, pleuritic or exertional cp, hempoptysis, variability  in activity tolerance  orthopnea pnd or leg swelling. Pt also denies any obvious fluctuation in symptoms with weather or environmental change or other alleviating or aggravating factors.       Current Medications (verified): 1)  Amiodarone Hcl 200 Mg Tabs (Amiodarone Hcl) .[name].. 1 Two Times A Day 2)  Advair Diskus 250-50 Mcg/dose Aepb (Fluticasone-Salmeterol) .... Take 1 Puff By Mouth Two Times A Day 3)  Nexium 40 Mg Cpdr (Esomeprazole Magnesium) .... Take 1 Tablet By Mouth Two Times A Day 4)  Aspirin 81 Mg Tbec (Aspirin) .... Take 1 Tablet By Mouth Once A Day 5)  Plavix 75 Mg Tabs (Clopidogrel Bisulfate) .... Take 1 Tablet By Mouth Every Other Day 6)  Flomax 0.4 Mg Caps (Tamsulosin Hcl) .... Take 1 Tablet By Mouth Once Daily 7)  Furosemide 20 Mg Tabs (Furosemide) .[name].. 1 At Bedtime 8)  Prenavite Multiple Vitamin 28-0.8 Mg Tabs (Prenatal Vit-Fe Fumarate-Fa) .... Take 1 Tablet By Mouth Once A Day 9)  Spiriva Handihaler 18 Mcg Caps (Tiotropium Bromide Monohydrate) .... Use Once Daily 10)  Alprazolam 0.25 Mg Tabs (Alprazolam) .... Take 1 Tablet By Mouth As Needed 11)  Milk of Magnesia (Unknown  Dosage) .... Take 3 Tablespoons By Mouth Twice Per Week As Needed 12)  Gas-X Extra Strength 125 Mg Caps (Simethicone) .... Take As Needed For Gas 13)  Vicodin 5-500 Mg Tabs (Hydrocodone-Acetaminophen) .... Take 1 Tablet By Mouth Once A Day 14)  Tylenol Arthritis Pain 650 Mg Cr-Tabs (Acetaminophen) .... Take 2 Tablets By Mouth Once Daily As Needed 15)  Proair Hfa 108 (90 Base) Mcg/act Aers (Albuterol Sulfate) .... 2 Puffs Every 4-6 Hrs As Needed 16)  Albuterol Sulfate (2.5 Mg/72ml) 0.083% Nebu (Albuterol Sulfate) .[name].. 1 Up To 3 Times Per Day As Needed  Allergies (verified): 1)  ! Sulfa  Past History:  Past Medical History: GERD (ICD-530.81) w/ LPR COPD...................................................Marland KitchenWert Ischemic heart disease Myocardial Infarction x 2 Atrial Fibrillation     - On amiodarone December 02, 2009 with ? ILD and ESR 76 Degenerative joint disease Hemorrhoids Hyperlipidemia Hypertension Arrhythmia Adenomatous Colon Polyps 02/2002  Family History: No FH of Colon Cancer: Family History of Colon Polyps: Brother ? Family History of Heart Disease: Brother Negative for respiratory diseases or atopy   Social History: Retired Designer, industrial/product work Married Patient is a former smoker. -stopped October 2010. Smoked approx 55 yrs up to 1 1/2 ppd Alcohol Use - yes-occasional Daily Caffeine Use-1 cup daily Illicit Drug Use - no Patient gets regular exercise.  Review of Systems       The patient complains of shortness of breath with activity and hand/feet swelling.  The patient denies shortness  of breath at rest, productive cough, non-productive cough, coughing up blood, chest pain, irregular heartbeats, acid heartburn, indigestion, loss of appetite, weight change, abdominal pain, difficulty swallowing, sore throat, tooth/dental problems, headaches, nasal congestion/difficulty breathing through nose, sneezing, itching, ear ache, anxiety, depression, joint stiffness or pain, rash,  change in color of mucus, and fever.    Vital Signs:  Patient profile:   77 year old male Weight:      207 pounds O2 Sat:      96 % on Room air Temp:     97.5 degrees F oral Pulse rate:   78 / minute BP sitting:   132 / 60  (left arm)  Vitals Entered By: Vernie Murders (December 02, 2009 9:55 AM)  O2 Flow:  Room air  Serial Vital Signs/Assessments:  Comments: Ambulatory Pulse Oximetry  Resting; HR__75___    02 Sat__94___  Lap1 (185 feet)   HR__83___   02 Sat__91___ Lap2 (185 feet)   HR____87_   02 Sat_91____    Lap3 (185 feet)   HR___90__   02 Sat___90__  _x__Test Completed without Difficulty ___Test Stopped due to:   By: Abigail Miyamoto RN    Physical Exam  Additional Exam:  wt 205 > 207  December 02, 2009 amb mod barel chest wm nad at rest HEENT mild turbinate edema.  Oropharynx no thrush or excess pnd or cobblestoning.  No JVD or cervical adenopathy. Mild accessory muscle hypertrophy. Trachea midline, nl thryroid. Chest was hyperinflated by percussion with diminished breath sounds and moderate increased exp time without wheeze. Hoover sign positive at mid inspiration. Regular rate and rhythm without murmur gallop or rub or increase P2, trace edema.  Abd: no hsm, nl excursion. Ext warm without cyanosis or clubbing.      Sodium                    138 mEq/L                   135-145   Potassium                 4.8 mEq/L                   3.5-5.1   Chloride                  101 mEq/L                   96-112   Carbon Dioxide            27 mEq/L                    19-32   Glucose                   86 mg/dL                    45-40   BUN                       17 mg/dL                    9-81   Creatinine                1.5 mg/dL                   1.9-1.4   Calcium  8.5 mg/dL                   9.5-28.4   GFR                       46.73 mL/min                >60  Tests: (2) CBC Platelet w/Diff (CBCD)   White Cell Count          10.1 K/uL                    4.5-10.5   Red Cell Count       [L]  3.54 Mil/uL                 4.22-5.81   Hemoglobin           [L]  11.4 g/dL                   13.2-44.0   Hematocrit           [L]  33.8 %                      39.0-52.0   MCV                       95.3 fl                     78.0-100.0   MCHC                      33.6 g/dL                   10.2-72.5   RDW                  [H]  15.6 %                      11.5-14.6   Platelet Count            287.0 K/uL                  150.0-400.0   Neutrophil %              58.2 %                      43.0-77.0   Lymphocyte %         [L]  7.3 %                       12.0-46.0   Monocyte %                8.7 %                       3.0-12.0   Eosinophils%         [H]  25.4 %                      0.0-5.0     smear reviewed   Basophils %               0.4 %                       0.0-3.0  Neutrophill Absolute      5.8 K/uL                    1.4-7.7   Lymphocyte Absolute       0.7 K/uL                    0.7-4.0   Monocyte Absolute         0.9 K/uL                    0.1-1.0  Eosinophils, Absolute                        [H]  2.6 K/uL                    0.0-0.7   Basophils Absolute        0.0 K/uL                    0.0-0.1  Tests: (3) Hepatic/Liver Function Panel (HEPATIC)   Total Bilirubin           0.6 mg/dL                   1.1-9.1   Direct Bilirubin          0.2 mg/dL                   4.7-8.2   Alkaline Phosphatase [L]  29 U/L                      39-117   AST                  [H]  249 U/L                     0-37   ALT                  [H]  245 U/L                     0-53   Total Protein             6.2 g/dL                    9.5-6.2   Albumin              [L]  2.8 g/dL                    1.3-0.8  Tests: (4) TSH (TSH)   FastTSH                   3.76 uIU/mL                 0.35-5.50  Tests: (5) B-Type Natiuretic Peptide (BNPR)  B-Type Natriuetic Peptide                        [H]  251.6 pg/mL                 0.0-100.0  Tests: (6) Sed Rate (ESR)   Sed  Rate             [H]  74 mm/hr                    0-22  CXR  Procedure date:  12/02/2009  Findings:       Comparison: Chest radiograph 10/31/1998   Findings: None mildly enlarged heart silhouette is unchanged from prior.  There is flattened hemidiaphragms.  There is bronchitic change at the lung bases versus mild interstitial lung disease which is unchanged from prior.  Upper lungs are relatively clear.   IMPRESSION:   1.  No clear acute cardiopulmonary process.   3.  Bronchitic change at the lung bases versus mild interstitial lung disease.  Impression & Recommendations:  Problem # 1:  COPD UNSPECIFIED (ICD-496) FEV1 1,44 (44%) ratio 51 by PFT's 11/28/09 is c/w GOLD III copd  However, When respiratory symptoms worsen well  after a patient reports complete smoking cessation,  it is very hard to "blame" COPD  ie it doesn't make any more sense than hearing a  [employer]SC[employer] driver wrecked his car while driving his kids to school or a Careers adviser sliced his hand off carving [place].  Once the high risk activity stops,  the symptoms should not suddenly erupt.  If so, the differential diagnosis should include  obesity/deconditioning,  LPR/Reflux, CHF, or side effect of medications, esp ace inhibitors and Amiodarone in cardiac patients.  Will send copy to Dr Rudean Haskell re concern for amiodarone in pt with apparent incipient ild and esr 74 and have pt return for full set of pfts  Problem # 2:  LIVER FUNCTION TESTS, ABNORMAL, HX OF (ICD-V12.2) ? tylenol related ? amiodarone related  REc avoid tylenol and alchol and recheck next ov  Medications Added to Medication List This Visit: 1)  Amiodarone Hcl 200 Mg Tabs (Amiodarone hcl) .[name].. 1 two times a day 2)  Furosemide 20 Mg Tabs (Furosemide) .[name].. 1 at bedtime 3)  Tylenol Arthritis Pain 650 Mg Cr-tabs (Acetaminophen) .... Take 2 tablets by mouth once daily as needed 4)  Proair Hfa 108 (90 Base) Mcg/act Aers (Albuterol sulfate) .... 2 puffs every 4-6 hrs as  needed 5)  Albuterol Sulfate (2.5 Mg/57ml) 0.083% Nebu (Albuterol sulfate) .[name].. 1 up to 3 times per day as needed  Other Orders: New Patient Level V (99205) Pulse Oximetry, Ambulatory (04540) T-2 View CXR (71020TC) TLB-BMP (Basic Metabolic Panel-BMET) (80048-METABOL) TLB-CBC Platelet - w/Differential (85025-CBCD) TLB-Hepatic/Liver Function Pnl (80076-HEPATIC) TLB-TSH (Thyroid Stimulating Hormone) (84443-TSH) TLB-BNP (B-Natriuretic Peptide) (83880-BNPR) TLB-Sedimentation Rate (ESR) (85652-ESR)  Patient Instructions: 1)  Advair 250 /50 one first thing in am then again about 12 hours later 2)  once you get the atrovent try using it with albuterol 4x daily automatically and stop spiriva  3)  Proaire can be used if needed for shortness of breath 4)  Please schedule a follow-up appointment in 6 weeks, sooner if needed with PFT's  5)  LATE ADD 6)  LFT's up so avoid alcohol and tylenl and will send  to dr Russella Dar to review  Appended Document: Pulmonary consultation/ copd eval/ LFT's up Spoke with pt and notified LFT's elevated and advised him to avoid tylenol and ETOH.  Pt verbalized understanding.

## 2010-05-05 NOTE — Letter (Signed)
Summary: Anticoagulation Modification Letter  [facility]  [address] [place], [place] [address]   Phone: [phone]  Fax: [phone]    May 05, 2009  Re:    Andrew [name] DOB:    Aug 29, 1930 MRN:    [id number]    Dear Dr. Deborah Chalk,  We have scheduled the above patient for an endoscopic procedure. Our records show that  he/she is on anticoagulation therapy. Please advise as to how long the patient may come off their therapy of Plavix prior to the scheduled procedure(s) on 05/21/09.   Please fax back/or route the completed form to [place] at [phone].  Thank you for your help with this matter.  Sincerely,  Christie Nottingham CMA [facility] Dull)   Physician Recommendation:  Hold Plavix 7 days prior ________________  Hold Coumadin 5 days prior ____________  Other ______________________________     Appended Document: Anticoagulation Modification Letter pt notified to come off 7 days before procedure per Dr. Deborah Chalk. pt verbalized understanding.

## 2010-05-05 NOTE — Letter (Signed)
Summary: Patient Notice- Polyp Results  [facility]  [address] [place], [place] [address]   Phone: [phone]  Fax: [phone]        May 22, 2009 MRN: [id number]    VALENTINO SAAVEDRA [address] [place], [place]  [address]    Dear Mr. Handrich,  I am pleased to inform you that the colon polyp(s) removed during your recent colonoscopy was (were) found to be benign (no cancer detected) upon pathologic examination.  I recommend you have a repeat colonoscopy examination in 1 year to look for recurrent polyps, as having colon polyps increases your risk for having recurrent polyps or even colon cancer in the future.  Should you develop new or worsening symptoms of abdominal pain, bowel habit changes or bleeding from the rectum or bowels, please schedule an evaluation with either your primary care physician or with me.  Continue treatment plan as outlined the day of your exam.  Please call us if you are having persistent problems or have questions about your condition that have not been fully answered at this time.  Sincerely,  Meryl Dare MD [facility]  This letter has been electronically signed by your physician.

## 2010-05-05 NOTE — Assessment & Plan Note (Signed)
Summary: low hemoglobin,abd pain...as.   History of Present Illness Visit Type: Follow-up Visit Primary GI MD: Elie Goody MD [facility] Primary Provider: Nadyne Coombes, MD Chief Complaint: Patient c/o several months upper abdominal discomfort which he relates to his diet. He was also found to have a low hemoglobin by Dr Deborah Chalk. History of Present Illness:   This is a 77 year old male, who is here today for further evaluation of anemia and upper abdominal discomfort. He relates intermittent episodes of mild upper abdominal pain that generally follows certain foods. He describes his symptoms is mild.  He was found to have normocytic anemia with a hemoglobin of 11.5 in December, with a normal range of 12-18. Repeat hemoglobin in January was 11.9, with a normal range of 14.1, to 18.1. He has no change in bowel habits or rectal bleeding.   GI Review of Systems    Reports abdominal pain, belching, and  bloating.     Location of  Abdominal pain: upper abdomen.    Denies acid reflux, chest pain, dysphagia with liquids, dysphagia with solids, heartburn, loss of appetite, nausea, vomiting, vomiting blood, weight loss, and  weight gain.      Reports constipation.     Denies anal fissure, black tarry stools, change in bowel habit, diarrhea, diverticulosis, fecal incontinence, heme positive stool, hemorrhoids, irritable bowel syndrome, jaundice, light color stool, liver problems, rectal bleeding, and  rectal pain. Preventive Screening-Counseling & Management  Alcohol-Tobacco     Smoking Status: quit  Caffeine-Diet-Exercise     Does Patient Exercise: yes      Drug Use:  no.     Current Medications (verified): 1)  Amiodarone Hcl 200 Mg Tabs (Amiodarone Hcl) .... Take 2 Tablets By Mouth Once Daily 2)  Advair Diskus 250-50 Mcg/dose Aepb (Fluticasone-Salmeterol) .... Take 1 Puff By Mouth Two Times A Day 3)  Nexium 40 Mg Cpdr (Esomeprazole Magnesium) .... Take 1 Tablet By Mouth Two Times A Day 4)   Lipitor 20 Mg Tabs (Atorvastatin Calcium) .... Take 1 Tablet By Mouth Once A Day 5)  Aspirin 81 Mg Tbec (Aspirin) .... Take 1 Tablet By Mouth Once A Day 6)  Plavix 75 Mg Tabs (Clopidogrel Bisulfate) .... Take 1 Tablet By Mouth Every Other Day 7)  Flomax 0.4 Mg Caps (Tamsulosin Hcl) .... Take 1 Tablet By Mouth Once Daily 8)  Furosemide 40 Mg Tabs (Furosemide) .... .take 1 Tablet By Mouth Every Other Day 9)  Prenavite Multiple Vitamin 28-0.8 Mg Tabs (Prenatal Vit-Fe Fumarate-Fa) .... Take 1 Tablet By Mouth Once A Day 10)  Spiriva Handihaler 18 Mcg Caps (Tiotropium Bromide Monohydrate) .... Use Once Daily 11)  Norvasc 5 Mg Tabs (Amlodipine Besylate) .... Take 1 Tablet By Mouth As Needed 12)  Alprazolam 0.25 Mg Tabs (Alprazolam) .... Take 1 Tablet By Mouth As Needed 13)  Milk of Magnesia (Unknown Dosage) .... Take 3 Tablespoons By Mouth Twice Per Week As Needed 14)  Gas-X Extra Strength 125 Mg Caps (Simethicone) .... Take As Needed For Gas 15)  Vicodin 5-500 Mg Tabs (Hydrocodone-Acetaminophen) .... Take 1 Tablet By Mouth Once A Day 16)  Tylenol Arthritis Pain 650 Mg Cr-Tabs (Acetaminophen) .... Take 2 Tablets By Mouth Once Daily  Allergies (verified): 1)  ! Sulfa  Past History:  Past Medical History: Reviewed history from 04/30/2009 and no changes required. GERD (ICD-530.81) w/ LPR COPD Ischemic heart disease Myocardial Infarction x 2 Atrial Fibrillation Degenerative joint disease Hemorrhoids Hyperlipidemia Hypertension Arrhythmia Adenomatous Colon Polyps 02/2002  Past Surgical History: Angioplasty  Cataract Extraction-bilateral Excision of benign nodule from left chin Cardiac Stents x 3  Family History: No FH of Colon Cancer: Family History of Colon Polyps: Brother ? Family History of Heart Disease: Brother  Social History: Retired Patient is a former smoker. -stopped October 2010. Alcohol Use - yes-occasional Daily Caffeine Use-1 cup daily Illicit Drug Use - no Patient  gets regular exercise. Smoking Status:  quit Drug Use:  no Does Patient Exercise:  yes  Review of Systems       The patient complains of anemia, anxiety-new, back pain, fatigue, shortness of breath, and swelling of feet/legs.         The pertinent positives and negatives are noted as above and in the HPI. All other ROS were reviewed and were negative.   Vital Signs:  Patient profile:   77 year old male Height:      72 inches Weight:      200.50 pounds BMI:     27.29 BSA:     2.13 Pulse rate:   88 / minute Pulse rhythm:   regular BP sitting:   104 / 70  (left arm)  Vitals Entered By: Hortense Ramal CMA [facility]) (May 05, 2009 8:34 AM)  Physical Exam  General:  Well developed, well nourished, no acute distress. Head:  Normocephalic and atraumatic. Eyes:  PERRLA, no icterus. Ears:  Normal auditory acuity. Mouth:  No deformity or lesions, dentition normal. Lungs:  Clear throughout to auscultation. Heart:  Regular rate and rhythm; no murmurs, rubs,  or bruits. Abdomen:  Soft, nontender and nondistended. No masses, hepatosplenomegaly or hernias noted. Normal bowel sounds. Rectal:  deferred until time of colonoscopy.   Psych:  Alert and cooperative. Normal mood and affect.   Impression & Recommendations:  Problem # 1:  UNSPECIFIED ANEMIA (ICD-285.9) Further evaluation of anemia as outlined below. Rule out occult gastrointestinal losses. The risks, benefits and alternatives to colonoscopy with possible biopsy and possible polypectomy were discussed with the patient and they consent to proceed. The procedure will be scheduled electively. The risks, benefits and alternatives to endoscopy with possible biopsy and possible dilation were discussed with the patient and they consent to proceed. The procedure will be scheduled electively. Orders: TLB-Iron, (Fe) Total (83540-FE) TLB-IBC Pnl (Iron/FE;Transferrin) (83550-IBC) TLB-Ferritin (82728-FER) TLB-B12, Serum-Total ONLY  ([address]-U04) TLB-Folic Acid (Folate) (82746-FOL) [facility] Hemoccult Cards #3 (take home) (Hem cards #3) Colon/Endo (Colon/Endo)  Problem # 2:  PERSONAL HISTORY OF COLONIC POLYPS (ICD-V12.72) Personal history of adenomatous colon polyps. Last colonoscopy January 2007. Colonoscopy as above. Orders: Colon/Endo (Colon/Endo)  Problem # 3:  GERD (ICD-530.81) GERD with intermittent breakthrough symptoms. Intensify antireflux measures. Avoid foods that triggered symptoms. Continue Nexium 40 mg twice daily. Upper endoscopy as above. Orders: Colon/Endo (Colon/Endo)  Patient Instructions: 1)  Colonoscopy brochure given.  2)  Conscious Sedation brochure given.  3)  Upper Endoscopy brochure given.  4)  Get your labs drawn today in the basement.  5)  Copy sent to : Nadyne Coombes, MD  Prescriptions: MOVIPREP 100 GM  SOLR (PEG-KCL-NACL-NASULF-NA ASC-C) As per prep instructions.  #1 x 0   Entered by:   Christie Nottingham CMA (AAMA)   Authorized by:   Meryl Dare MD [facility]   Signed by:   Meryl Dare MD FACG on 05/05/2009   Method used:   Electronically to        [facility]  [id number] (retail)       [address]  [place], [place]  [address]       Ph: [phone] or [phone]       Fax: [phone]   RxID:   [id number]

## 2010-05-07 NOTE — Assessment & Plan Note (Signed)
Summary: Pulmonary/ ext summary final ov, no desat walking   Copy to:  Dr. Nadyne Coombes Primary Provider/Referring Provider:  Nadyne Coombes, MD  CC:  Dyspnea- the same .  History of Present Illness: 60 yowm quit smoking October 2010 at wt= around 200  with some am cough and congestion x 5-10 min maybe a little better after inhalers but breathing gradually worse since quit smoking.   December 02, 2009 cc doe x one month much worse to point where needs to stop when gets back to house to mailbox also with increase leg swelling so increase furosemide to 40 and 20 at night > no better.  no cough. LFT's and esr were elevated so rx with advair and dc amio  January 12, 2010 Follow up with PFTs.  Pt was seen by PCP approx 3 wk ago - was told he had "early stages of PNA" and was given steroid and abx.  States breathing "comes and goes."  prod cough with yellow mucus.   overally breathing better but feels he's on too many meds. stop advair Start symbicort 160 2 puffs first thing  in am and 2 puffs again in pm about 12 hours later  Work on inhaler technique:  relax and blow all the way out then take a nice smooth deep breath back in, triggering the inhaler at same time you start breathing in hold for a few seconds then rinse and gargle. If you like symbicort fill  the prescription - you should notice less need for the other breathing treatments which can be used as neeed   12/2-09/2009 back in hosp with chf/copd   April 24, 2010 ov cc doe now on 02 with activity but doesn't use it,  does use it at night at 2lpm. no cough. back on advair and ok on it plus proaire.   Confused with meds/names  Pt denies any significant sore throat, dysphagia, itching, sneezing,  nasal congestion or excess secretions,  fever, chills, sweats, unintended wt loss, pleuritic or exertional cp, hempoptysis, change in activity tolerance  orthopnea pnd or leg swelling. Pt also denies any obvious fluctuation in symptoms  Current  Medications (verified): 1)  Nexium 40 Mg Cpdr (Esomeprazole Magnesium) .... Take One 30-60 Min Before First and Last Meals of The Day 2)  Aspirin 81 Mg Tbec (Aspirin) .... Take 1 Tablet By Mouth Once A Day 3)  Diovan 160 Mg Tabs (Valsartan) .... Take 1 Tablet By Mouth Once A Day 4)  Plavix 75 Mg Tabs (Clopidogrel Bisulfate) .... Take 1 Tablet By Mouth Every Other Day 5)  Flomax 0.4 Mg Caps (Tamsulosin Hcl) .... Take 1 Tablet By Mouth Once Daily 6)  Furosemide 40 Mg Tabs (Furosemide) .[name].. 1 Two Times A Day and and 1/2 Extra As Needed If Still Swelling 7)  Prenavite Multiple Vitamin 28-0.8 Mg Tabs (Prenatal Vit-Fe Fumarate-Fa) .... Take 1 Tablet By Mouth Once A Day 8)  Alprazolam 0.25 Mg Tabs (Alprazolam) .... Take 1 Tablet By Mouth As Needed 9)  Milk of Magnesia (Unknown Dosage) .... Take 3 Tablespoons By Mouth Twice Per Week As Needed 10)  Gas-X Extra Strength 125 Mg Caps (Simethicone) .... Take As Needed For Gas 11)  Vicodin 5-500 Mg Tabs (Hydrocodone-Acetaminophen) .... Take 1 Every 6 Hrs As Needed For Pain 12)  Tylenol Arthritis Pain 650 Mg Cr-Tabs (Acetaminophen) .... Take 2 Tablets By Mouth Once Daily As Needed 13)  Proair Hfa 108 (90 Base) Mcg/act Aers (Albuterol Sulfate) .... 2 Puffs Every 4-6 Hrs  As Needed 14)  Albuterol Sulfate (2.5 Mg/42ml) 0.083% Nebu (Albuterol Sulfate) .... Four Times Daily 15)  Atrovent 0.06 % Soln (Ipratropium Bromide) .[name].. 1 in Nebulizer Four Times A Day 16)  Toprol Xl 25 Mg Xr24h-Tab (Metoprolol Succinate) .... 1/2 Every Other Day 17)  Advair Diskus 250-50 Mcg/dose Aepb (Fluticasone-Salmeterol) .[name].. 1 Puff Two Times A Day 18)  Oxygen .... 2 Lpm At Bedtime and 1 Lpm During The Day As Needed  Allergies (verified): 1)  ! Sulfa  Past History:  Past Medical History: GERD (ICD-530.81) w/ LPR COPD...................................................Marland KitchenWert      - PFT's January 12, 2010 FEV1  1.27 (44%) and ratio 58 no better p b2 and DLCO 43%       -HFA 75% p  coaching January 12, 2010 >  90% p externsive coaching April 24, 2010  Ischemic heart disease Myocardial Infarction x 2 Atrial Fibrillation     - On amiodarone December 02, 2009 with ? ILD and ESR 74 >> stop amiodarone 12/2009 CHF      - EF 30% Mar 09 2010 Degenerative joint disease Hemorrhoids Hyperlipidemia Hypertension Arrhythmia Adenomatous Colon Polyps 02/2002  Vital Signs:  Patient profile:   77 year old male Weight:      194 pounds O2 Sat:      92 % on Room air Temp:     97.5 degrees F oral Pulse rate:   43 / minute BP sitting:   114 / 64  (left arm)  Vitals Entered By: Vernie Murders (April 24, 2010 10:49 AM)  O2 Flow:  Room air  Serial Vital Signs/Assessments:  Comments: 11:19 AM Ambulatory Pulse Oximetry  Resting; HR__79___    02 Sat__93%ra___  Lap1 (185 feet)   HR__90___   02 Sat__91%ra___ Lap2 (185 feet)   HR_____   02 Sat_____    Lap3 (185 feet)   HR_____   02 Sat_____  ___Test Completed without Difficulty _x__Test Stopped due to: pt c/o increased SOB, hip and back pain   By: Vernie Murders    Physical Exam  Additional Exam:  wt 205 > 207  December 02, 2009 > 203 January 12, 2010 > 194 April 24, 2010  amb mod bareel  chest wm nad at rest HEENT mild turbinate edema.  Oropharynx no thrush or excess pnd or cobblestoning.  No JVD or cervical adenopathy. Mild accessory muscle hypertrophy. Trachea midline, nl thryroid. Chest was hyperinflated by percussion with diminished breath sounds and moderate increased exp time without wheeze. Hoover sign positive at mid inspiration. Regular rate and rhythm without murmur gallop or rub or increase P2, trace edema.  Abd: no hsm, nl excursion. Ext warm without cyanosis or clubbing.     Impression & Recommendations:  Problem # 1:  COPD UNSPECIFIED (ICD-496) GOLD III  with freq exac on advair    DDX of  difficult airways managment all start with A and  include Adherence, Ace Inhibitors, Acid Reflux, Active Sinus  Disease, Alpha 1 Antitripsin deficiency, Anxiety masquerading as Airways dz,  ABPA,  allergy(esp in young), Aspiration (esp in elderly), Adverse effects of DPI,  Active smokers, plus one B  = Beta blocker use..and one C =CHF     ? adverse effect of dpi; back on advair ? GERD  reviewed ppi/ diet ? adherence: I spent extra time with the patient today explaining optimal mdi  technique.  This improved from  50-90%   Each maintenance medication was reviewed in detail including most importantly the difference between maintenance prns and  under what circumstances the prns are to be used.  In addition, these two groups (for which the patient should keep up with refills) were distinguished from a third group :  meds that are used only short term with the intent to complete a course of therapy and then not refill them.  The med list was then fully reconciled and reorganized to reflect this important distinction. This was a major challeng and I made very llittle headway  Nothing else to offer here as assuring  adherence / med reconciliation not feasible at this point and Dr Hal Hope is perfectly capable of treating his copd; I will see him back if there are specific management or diagnostic issues.   Medications Added to Medication List This Visit: 1)  Furosemide 40 Mg Tabs (Furosemide) .[name].. 1 two times a day and and 1/2 extra as needed if still swelling 2)  Vicodin 5-500 Mg Tabs (Hydrocodone-acetaminophen) .... Take 1 every 6 hrs as needed for pain 3)  Atrovent 0.06 % Soln (Ipratropium bromide) .[name].. 1 in nebulizer four times a day 4)  Toprol Xl 25 Mg Xr24h-tab (Metoprolol succinate) .... 1/2 every other day 5)  Advair Diskus 250-50 Mcg/dose Aepb (Fluticasone-salmeterol) .[name].. 1 puff two times a day 6)  Oxygen  .... 2 lpm at bedtime and 1 lpm during the day as needed  Other Orders: Est. Patient Level IV (99214) Pulse Oximetry, Ambulatory (16109)  Patient Instructions: 1)  Work on inhaler technique:  relax  and blow all the way out then take a nice smooth deep breath back in, triggering the inhaler at same time you start breathing in  2)  Return here if needed for difficulty breathing or persistent cough but let Dr Hal Hope adjust your medications for your maximum benefit first and if you need to return here bring all your medicaitons  separated into two bags, the ones you take no matter what(like advair)  vs the ones you only use when you feel like it, like proaire

## 2010-05-31 IMAGING — CT CT CHEST W/ CM
2 of 3 series · 15 of 36 positions shown, 18 images · IV contrast (75CC OMNI 300)
Comparison: 03/27/2007, 12/22/2006 and 03/23/2002.

CLINICAL DATA: Right upper lobe nodule.

CT CHEST WITH CONTRAST
TECHNIQUE: Multidetector CT imaging of the chest was performed
following the standard protocol during bolus administration of
intravenous contrast.
Contrast: 75 ml Vmnipaque-XMM.

[Series 3: routine chest · axial · 0.70mm/px · z∈[-328,-12]mm · 12 of 75 slices shown, 15 images]
[im 6/75  mediastinal]
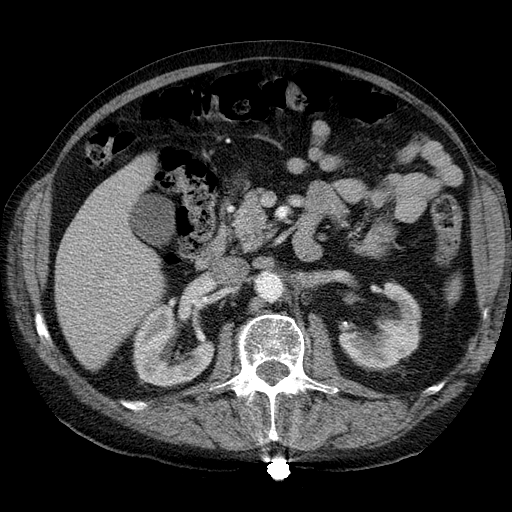
[im 6/75  lung]
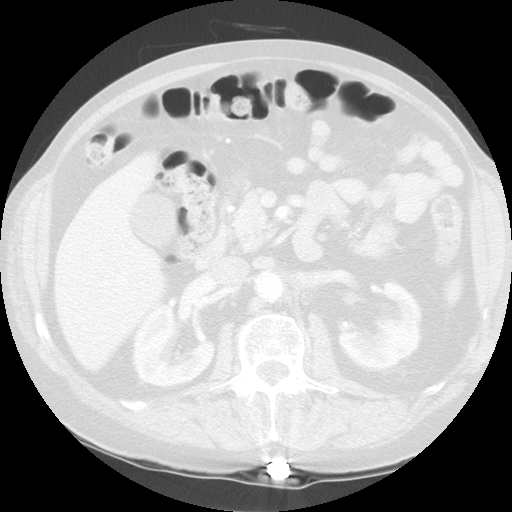
[im 11/75  lung]
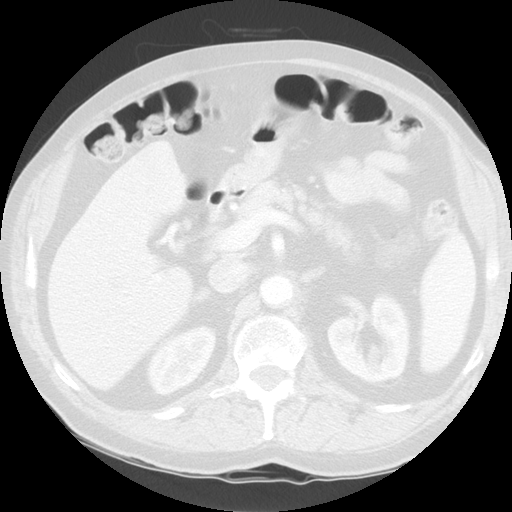
[im 17/75  lung]
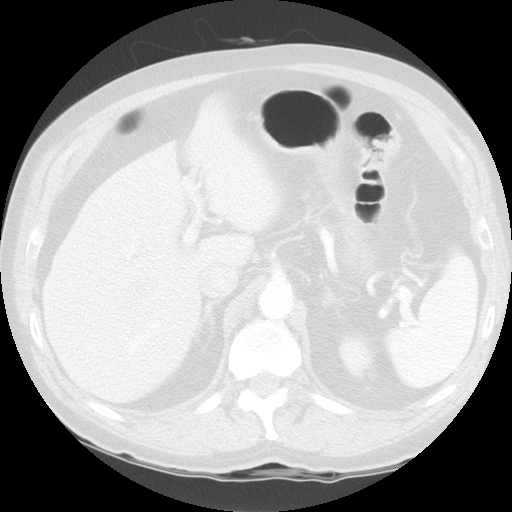
[im 22/75  lung]
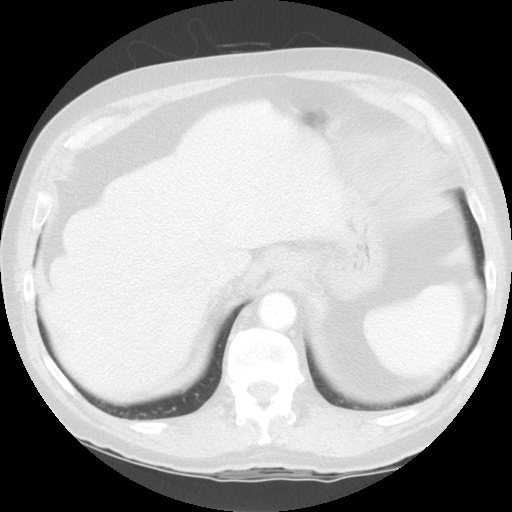
[im 28/75  mediastinal]
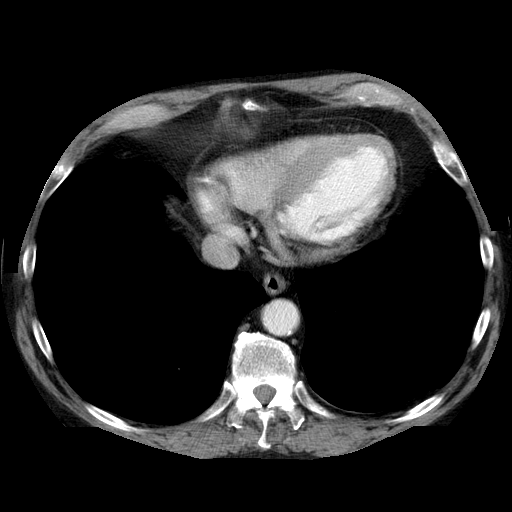
[im 28/75  lung]
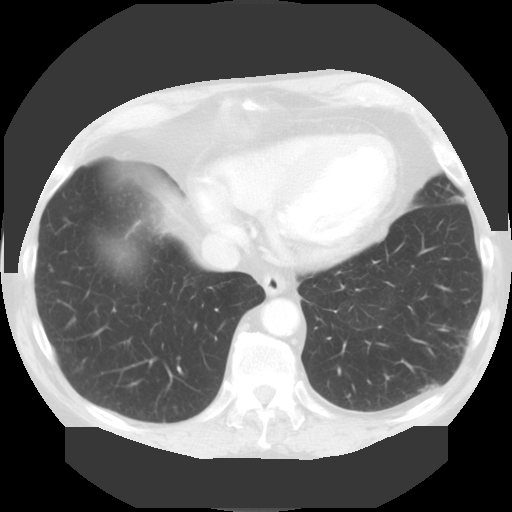
[im 33/75  lung]
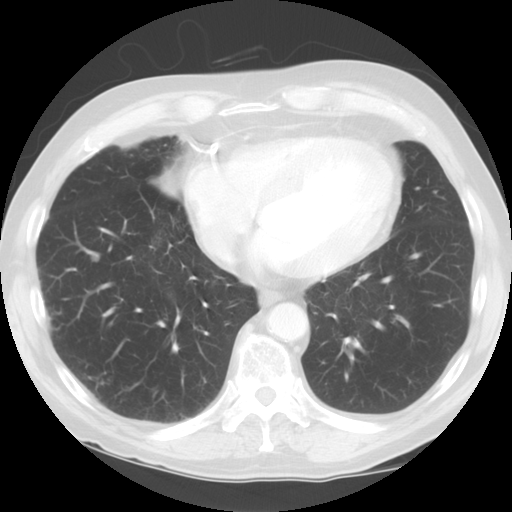
[im 42/75  lung]
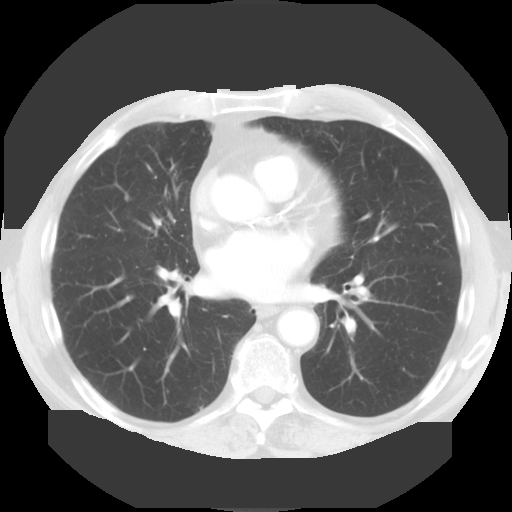
[im 47/75  lung]
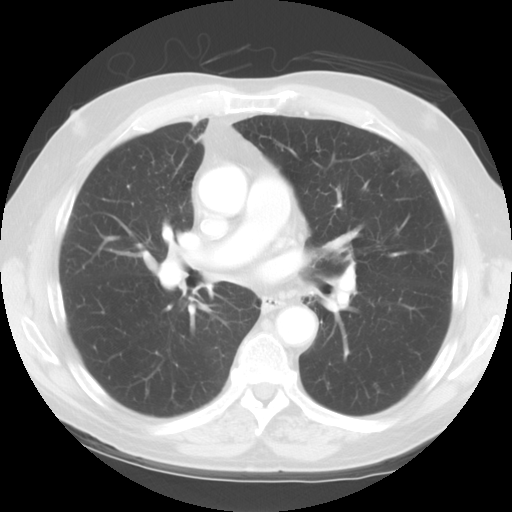
[im 53/75  mediastinal]
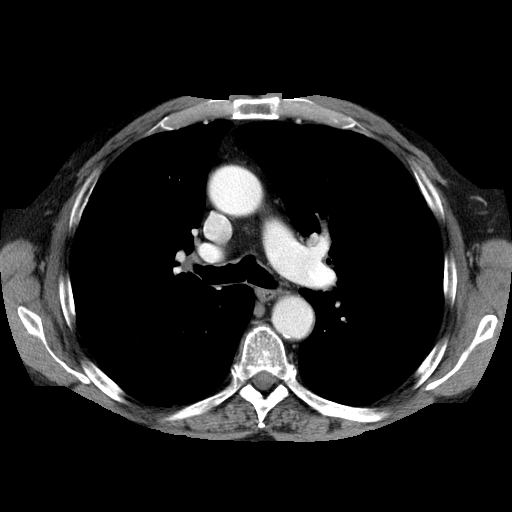
[im 53/75  lung]
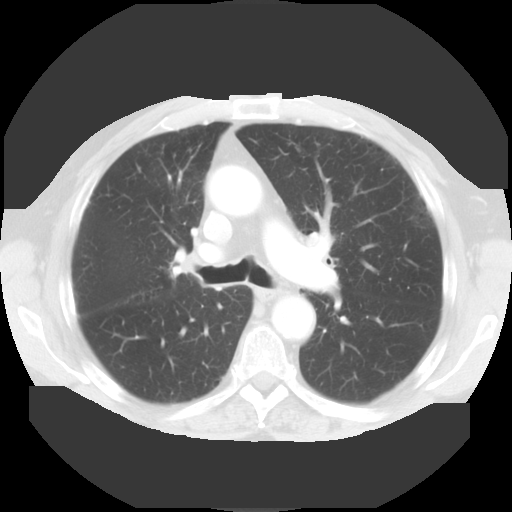
[im 58/75  lung]
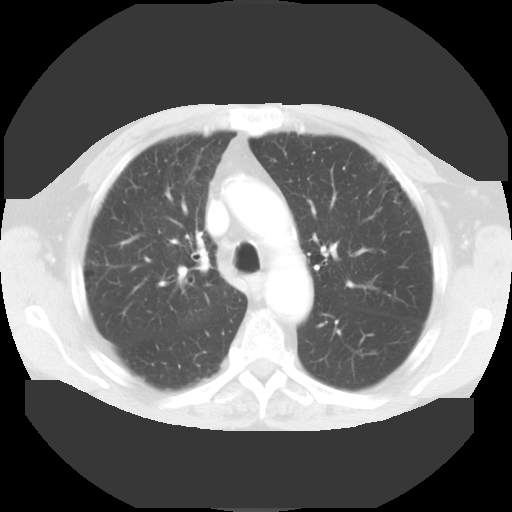
[im 64/75  lung]
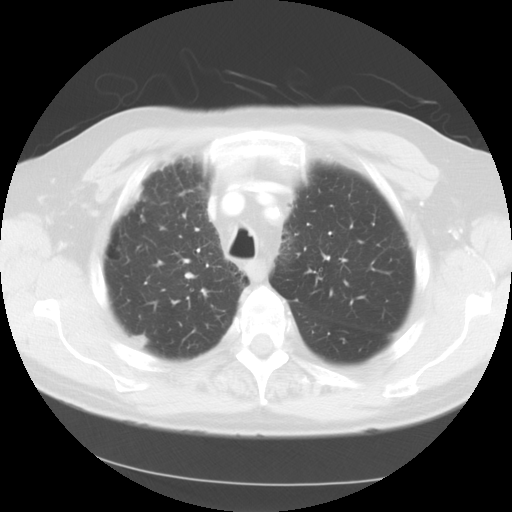
[im 69/75  lung]
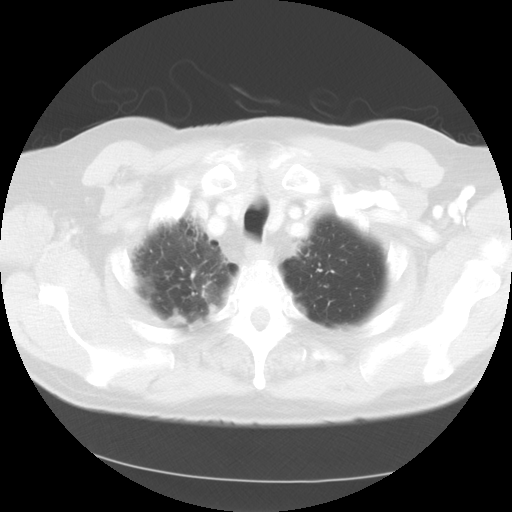

[Series 602: sagittal body · sagittal · 0.73mm/px · 3 of 145 slices shown]
[im 29/145  lung]
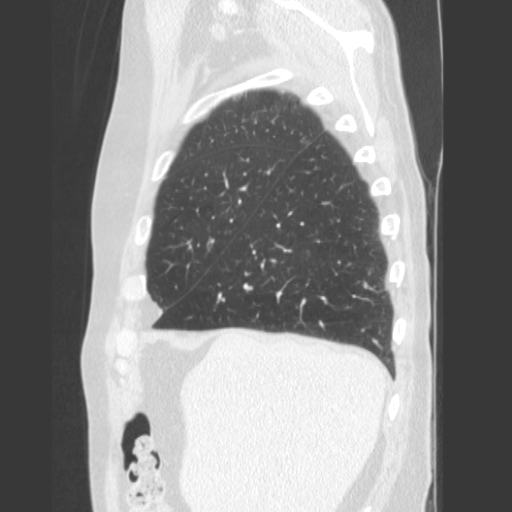
[im 58/145  lung]
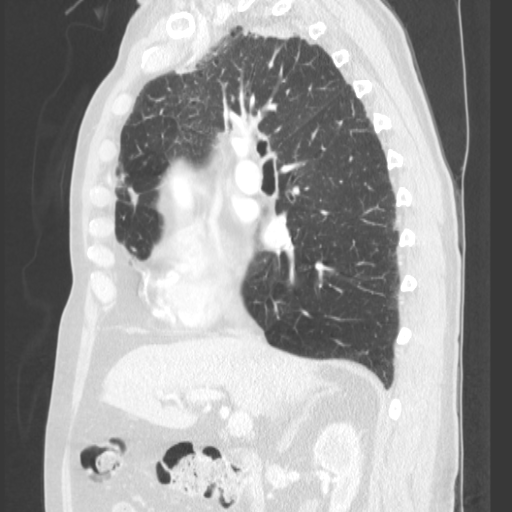
[im 87/145  lung]
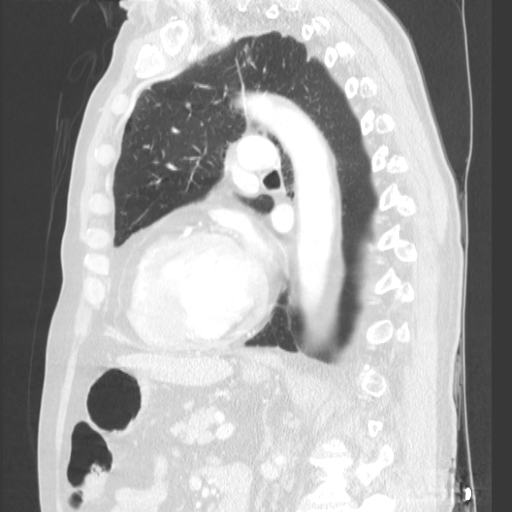

[15 of 36 positions shown; findings below may reference images not displayed]

FINDINGS: No pathologically enlarged mediastinal, hilar or axillary
lymph nodes.  Incidental note is made of bilateral gynecomastia.
Atherosclerotic calcification of the arterial vasculature,
including coronary arteries.  Heart is within normal limits for
size.  No pericardial effusion.

Biapical scarring.  Paraseptal emphysema.  A lobulated nodule the
right middle lobe is unchanged from 12/22/2006, measuring 13 x 11
mm.  Scattered tiny subpleural nodules are also unchanged.  Tiny
left lower lobe nodule (image 35), stable.  No pleural fluid.  A
small nodule is seen at the 2 o'clock position of the trachea
(image 11).

Incidental imaging of the upper abdomen shows a 3.9 x 4.9 cm low
attenuation lesion in the left kidney which is incompletely imaged.
No worrisome lytic or sclerotic lesions.
IMPRESSION: 1.  Right middle lobe nodule is stable in a 9-month interval from
12/22/2006.  Given its absence on 03/23/2002, its size and
lobulated contour, continued follow-up in 1 year is recommended, as
low grade adenocarcinoma is considered.
2.  Small nodular density in the trachea is likely adherent mucus,
given its absence on 03/27/2007.  However, attention on follow-up
exams warranted.

## 2010-06-15 LAB — CBC
HCT: 32.4 % — ABNORMAL LOW (ref 39.0–52.0)
Hemoglobin: 10.3 g/dL — ABNORMAL LOW (ref 13.0–17.0)
MCH: 29.8 pg (ref 26.0–34.0)
MCHC: 31.8 g/dL (ref 30.0–36.0)
MCHC: 32.5 g/dL (ref 30.0–36.0)
MCHC: 32.9 g/dL (ref 30.0–36.0)
MCHC: 33 g/dL (ref 30.0–36.0)
Platelets: 370 10*3/uL (ref 150–400)
Platelets: 383 10*3/uL (ref 150–400)
Platelets: 399 10*3/uL (ref 150–400)
RBC: 3.5 MIL/uL — ABNORMAL LOW (ref 4.22–5.81)
RBC: 3.96 MIL/uL — ABNORMAL LOW (ref 4.22–5.81)
RDW: 16 % — ABNORMAL HIGH (ref 11.5–15.5)
RDW: 16 % — ABNORMAL HIGH (ref 11.5–15.5)
WBC: 10 10*3/uL (ref 4.0–10.5)
WBC: 13.8 10*3/uL — ABNORMAL HIGH (ref 4.0–10.5)
WBC: 9.3 10*3/uL (ref 4.0–10.5)

## 2010-06-15 LAB — POCT CARDIAC MARKERS
CKMB, poc: 3.3 ng/mL (ref 1.0–8.0)
Myoglobin, poc: 187 ng/mL (ref 12–200)
Troponin i, poc: <0.05 ng/mL (ref 0.00–0.09)

## 2010-06-15 LAB — BASIC METABOLIC PANEL
BUN: 16 mg/dL (ref 6–23)
CO2: 25 mEq/L (ref 19–32)
Calcium: 8.1 mg/dL — ABNORMAL LOW (ref 8.4–10.5)
Calcium: 8.5 mg/dL (ref 8.4–10.5)
Calcium: 8.5 mg/dL (ref 8.4–10.5)
Creatinine, Ser: 1.01 mg/dL (ref 0.4–1.5)
Creatinine, Ser: 1.23 mg/dL (ref 0.4–1.5)
GFR calc Af Amer: >60 mL/min (ref >60)
GFR calc Af Amer: >60 mL/min (ref >60)
GFR calc non Af Amer: 57 mL/min — ABNORMAL LOW (ref >60)
GFR calc non Af Amer: >60 mL/min (ref >60)
GFR calc non Af Amer: >60 mL/min (ref >60)
Glucose, Bld: 106 mg/dL — ABNORMAL HIGH (ref 70–99)
Glucose, Bld: 124 mg/dL — ABNORMAL HIGH (ref 70–99)
Glucose, Bld: 218 mg/dL — ABNORMAL HIGH (ref 70–99)
Sodium: 131 mEq/L — ABNORMAL LOW (ref 135–145)

## 2010-06-15 LAB — BRAIN NATRIURETIC PEPTIDE
Pro B Natriuretic peptide (BNP): 835 pg/mL — ABNORMAL HIGH (ref 0.0–100.0)
Pro B Natriuretic peptide (BNP): 936 pg/mL — ABNORMAL HIGH (ref 0.0–100.0)

## 2010-06-15 LAB — COMPREHENSIVE METABOLIC PANEL
Albumin: 2.3 g/dL — ABNORMAL LOW (ref 3.5–5.2)
BUN: 13 mg/dL (ref 6–23)
Calcium: 8.1 mg/dL — ABNORMAL LOW (ref 8.4–10.5)
Glucose, Bld: 110 mg/dL — ABNORMAL HIGH (ref 70–99)
Potassium: 3.2 mEq/L — ABNORMAL LOW (ref 3.5–5.1)
Sodium: 133 mEq/L — ABNORMAL LOW (ref 135–145)
Total Protein: 6.2 g/dL (ref 6.0–8.3)

## 2010-06-15 LAB — CARDIAC PANEL(CRET KIN+CKTOT+MB+TROPI)
CK, MB: 3.9 ng/mL (ref 0.3–4.0)
Total CK: 132 U/L (ref 7–232)
Troponin I: 0.09 ng/mL — ABNORMAL HIGH (ref 0.00–0.06)

## 2010-06-15 LAB — CK TOTAL AND CKMB (NOT AT ARMC): Total CK: 112 U/L (ref 7–232)

## 2010-06-15 LAB — LIPID PANEL
Cholesterol: 153 mg/dL (ref 0–200)
HDL: 32 mg/dL — ABNORMAL LOW (ref >39)
LDL Cholesterol: 108 mg/dL — ABNORMAL HIGH (ref 0–99)
Total CHOL/HDL Ratio: 4.8 RATIO
VLDL: 13 mg/dL (ref 0–40)

## 2010-06-15 LAB — DIFFERENTIAL
Basophils Absolute: 0.1 10*3/uL (ref 0.0–0.1)
Basophils Relative: 0 % (ref 0–1)
Eosinophils Absolute: 0.6 10*3/uL (ref 0.0–0.7)
Eosinophils Relative: 1 % (ref 0–5)
Lymphocytes Relative: 7 % — ABNORMAL LOW (ref 12–46)
Monocytes Absolute: 0.8 10*3/uL (ref 0.1–1.0)
Neutrophils Relative %: 82 % — ABNORMAL HIGH (ref 43–77)
Neutrophils Relative %: 86 % — ABNORMAL HIGH (ref 43–77)

## 2010-06-15 LAB — TROPONIN I: Troponin I: 0.09 ng/mL — ABNORMAL HIGH (ref 0.00–0.06)

## 2010-06-17 LAB — CBC
HCT: 34.3 % — ABNORMAL LOW (ref 39.0–52.0)
Platelets: 447 10*3/uL — ABNORMAL HIGH (ref 150–400)
RBC: 3.73 MIL/uL — ABNORMAL LOW (ref 4.22–5.81)
RDW: 15 % (ref 11.5–15.5)
WBC: 12.6 10*3/uL — ABNORMAL HIGH (ref 4.0–10.5)

## 2010-06-17 LAB — COMPREHENSIVE METABOLIC PANEL
ALT: 51 U/L (ref 0–53)
Albumin: 2.3 g/dL — ABNORMAL LOW (ref 3.5–5.2)
Alkaline Phosphatase: 32 U/L — ABNORMAL LOW (ref 39–117)
Glucose, Bld: 108 mg/dL — ABNORMAL HIGH (ref 70–99)
Potassium: 4.6 mEq/L (ref 3.5–5.1)
Sodium: 137 mEq/L (ref 135–145)
Total Protein: 6.4 g/dL (ref 6.0–8.3)

## 2010-06-17 LAB — DIFFERENTIAL
Basophils Relative: 0 % (ref 0–1)
Eosinophils Absolute: 0 10*3/uL (ref 0.0–0.7)
Eosinophils Relative: 0 % (ref 0–5)
Monocytes Absolute: 0.8 10*3/uL (ref 0.1–1.0)
Monocytes Relative: 6 % (ref 3–12)

## 2010-06-20 LAB — COMPREHENSIVE METABOLIC PANEL
Albumin: 2.6 g/dL — ABNORMAL LOW (ref 3.5–5.2)
Alkaline Phosphatase: 50 U/L (ref 39–117)
BUN: 28 mg/dL — ABNORMAL HIGH (ref 6–23)
Chloride: 100 mEq/L (ref 96–112)
Glucose, Bld: 136 mg/dL — ABNORMAL HIGH (ref 70–99)
Potassium: 4.9 mEq/L (ref 3.5–5.1)
Total Bilirubin: 0.8 mg/dL (ref 0.3–1.2)

## 2010-06-20 LAB — DIFFERENTIAL
Basophils Absolute: 0 10*3/uL (ref 0.0–0.1)
Basophils Relative: 0 % (ref 0–1)
Eosinophils Absolute: 0.1 10*3/uL (ref 0.0–0.7)
Monocytes Absolute: 1 10*3/uL (ref 0.1–1.0)
Neutro Abs: 11.2 10*3/uL — ABNORMAL HIGH (ref 1.7–7.7)

## 2010-06-20 LAB — CBC
HCT: 33.5 % — ABNORMAL LOW (ref 39.0–52.0)
MCV: 95.3 fL (ref 78.0–100.0)
Platelets: 194 10*3/uL (ref 150–400)
RBC: 3.52 MIL/uL — ABNORMAL LOW (ref 4.22–5.81)
WBC: 12.8 10*3/uL — ABNORMAL HIGH (ref 4.0–10.5)

## 2010-06-20 LAB — D-DIMER, QUANTITATIVE: D-Dimer, Quant: 1.54 ug/mL-FEU — ABNORMAL HIGH (ref 0.00–0.48)

## 2010-06-20 LAB — POCT CARDIAC MARKERS
CKMB, poc: 1.7 ng/mL (ref 1.0–8.0)
Troponin i, poc: <0.05 ng/mL (ref 0.00–0.09)

## 2010-06-23 ENCOUNTER — Other Ambulatory Visit: Payer: Self-pay | Admitting: Cardiology

## 2010-06-23 ENCOUNTER — Ambulatory Visit (INDEPENDENT_AMBULATORY_CARE_PROVIDER_SITE_OTHER): Payer: Self-pay | Admitting: Cardiology

## 2010-06-23 ENCOUNTER — Encounter: Payer: Self-pay | Admitting: Cardiology

## 2010-06-23 ENCOUNTER — Other Ambulatory Visit: Payer: Self-pay | Admitting: *Deleted

## 2010-06-23 VITALS — BP 112/70 | HR 76 | Wt 191.6 lb

## 2010-06-23 DIAGNOSIS — I10 Essential (primary) hypertension: Secondary | ICD-10-CM

## 2010-06-23 DIAGNOSIS — I501 Left ventricular failure: Secondary | ICD-10-CM

## 2010-06-23 DIAGNOSIS — F419 Anxiety disorder, unspecified: Secondary | ICD-10-CM

## 2010-06-23 DIAGNOSIS — I4892 Unspecified atrial flutter: Secondary | ICD-10-CM | POA: Insufficient documentation

## 2010-06-23 DIAGNOSIS — R0602 Shortness of breath: Secondary | ICD-10-CM

## 2010-06-23 DIAGNOSIS — I5022 Chronic systolic (congestive) heart failure: Secondary | ICD-10-CM | POA: Insufficient documentation

## 2010-06-23 DIAGNOSIS — I259 Chronic ischemic heart disease, unspecified: Secondary | ICD-10-CM

## 2010-06-23 LAB — BASIC METABOLIC PANEL
Chloride: 101 mEq/L (ref 96–112)
Potassium: 4.3 mEq/L (ref 3.5–5.3)
Sodium: 136 mEq/L (ref 135–145)

## 2010-06-23 MED ORDER — ALPRAZOLAM 0.25 MG PO TABS: 0.2500 mg | ORAL_TABLET | Freq: Every evening | ORAL | Status: DC | PRN

## 2010-06-23 NOTE — Assessment & Plan Note (Signed)
We'll continue observation since he is off of amiodarone since September of 2011.

## 2010-06-23 NOTE — Assessment & Plan Note (Signed)
He is doing well with no recurrent chest pain.

## 2010-06-23 NOTE — Telephone Encounter (Signed)
Patient left RN a voicemail requesting refill on Alprazolam 0.25 mg.  RN e-prescribed Alprazolam.  Pt notified.

## 2010-06-23 NOTE — Progress Notes (Signed)
In general, it is doing reasonably well. He does have chronic shortness of breath is gradually getting stronger and actually walking some outside. He was able to know you are on arriving [place]. He's not had any significant arrhythmia since he has been off of amiodarone since September of 2011 because of increased liver function test. He is on low-dose metoprolol and takes Lasix on a regular basis. His ejection fraction is 20-25% with his last echo being in 2011. He's had serial BnPs in the range of 380.  Overall, he's been doing reasonably well.

## 2010-06-23 NOTE — Assessment & Plan Note (Signed)
We will check a B. Met and BNP today. He will continue to weigh himself regularly. He'll take extra diuretic as needed. For now, I think the main concern is the possibility of recurrent atrial fibrillation or flutter since his been off of amiodarone. For now, he continues to do well. He will have some degree of shortness of breath chronically because of his COPD. I will have him see [name] in approximately 4 months. We discussed my pending retirement in June of 2012

## 2010-06-24 ENCOUNTER — Telehealth: Payer: Self-pay | Admitting: *Deleted

## 2010-06-24 NOTE — Telephone Encounter (Signed)
Patient notified of lab results.  Patient will continue to weigh daily and adjust Lasix accordingly.

## 2010-07-13 LAB — CBC
HCT: 33.5 % — ABNORMAL LOW (ref 39.0–52.0)
Hemoglobin: 11.5 g/dL — ABNORMAL LOW (ref 13.0–17.0)
RDW: 13.4 % (ref 11.5–15.5)

## 2010-07-13 LAB — BASIC METABOLIC PANEL
CO2: 26 mEq/L (ref 19–32)
CO2: 26 mEq/L (ref 19–32)
Calcium: 9.2 mg/dL (ref 8.4–10.5)
Chloride: 100 mEq/L (ref 96–112)
GFR calc Af Amer: >60 mL/min (ref >60)
GFR calc non Af Amer: >60 mL/min (ref >60)
Glucose, Bld: 143 mg/dL — ABNORMAL HIGH (ref 70–99)
Potassium: 4.5 mEq/L (ref 3.5–5.1)
Sodium: 133 mEq/L — ABNORMAL LOW (ref 135–145)
Sodium: 134 mEq/L — ABNORMAL LOW (ref 135–145)

## 2010-07-14 LAB — CK TOTAL AND CKMB (NOT AT ARMC)
CK, MB: 1.6 ng/mL (ref 0.3–4.0)
Relative Index: INVALID (ref 0.0–2.5)
Relative Index: INVALID (ref 0.0–2.5)
Total CK: 25 U/L (ref 7–232)
Total CK: 35 U/L (ref 7–232)

## 2010-07-14 LAB — BASIC METABOLIC PANEL
BUN: 23 mg/dL (ref 6–23)
BUN: 24 mg/dL — ABNORMAL HIGH (ref 6–23)
BUN: 25 mg/dL — ABNORMAL HIGH (ref 6–23)
CO2: 24 mEq/L (ref 19–32)
CO2: 26 mEq/L (ref 19–32)
Calcium: 8.8 mg/dL (ref 8.4–10.5)
Calcium: 8.9 mg/dL (ref 8.4–10.5)
Chloride: 98 mEq/L (ref 96–112)
Chloride: 99 mEq/L (ref 96–112)
Creatinine, Ser: 1.19 mg/dL (ref 0.4–1.5)
Creatinine, Ser: 1.51 mg/dL — ABNORMAL HIGH (ref 0.4–1.5)
GFR calc Af Amer: 55 mL/min — ABNORMAL LOW (ref >60)
GFR calc Af Amer: >60 mL/min (ref >60)
GFR calc non Af Amer: 44 mL/min — ABNORMAL LOW (ref >60)
Glucose, Bld: 102 mg/dL — ABNORMAL HIGH (ref 70–99)
Potassium: 4.3 mEq/L (ref 3.5–5.1)

## 2010-07-14 LAB — CBC
HCT: 36.2 % — ABNORMAL LOW (ref 39.0–52.0)
MCHC: 34.1 g/dL (ref 30.0–36.0)
MCHC: 34.3 g/dL (ref 30.0–36.0)
MCV: 98 fL (ref 78.0–100.0)
MCV: 99.4 fL (ref 78.0–100.0)
Platelets: 172 10*3/uL (ref 150–400)
Platelets: 185 10*3/uL (ref 150–400)
Platelets: 190 10*3/uL (ref 150–400)
RBC: 3.33 MIL/uL — ABNORMAL LOW (ref 4.22–5.81)
RBC: 3.52 MIL/uL — ABNORMAL LOW (ref 4.22–5.81)
RDW: 13.3 % (ref 11.5–15.5)
WBC: 14.3 10*3/uL — ABNORMAL HIGH (ref 4.0–10.5)

## 2010-07-14 LAB — URINALYSIS, DIPSTICK ONLY
Bilirubin Urine: NEGATIVE
Nitrite: NEGATIVE
Protein, ur: 100 mg/dL — AB
Specific Gravity, Urine: 1.019 (ref 1.005–1.030)
Urobilinogen, UA: 2 mg/dL — ABNORMAL HIGH (ref 0.0–1.0)

## 2010-07-14 LAB — URINE MICROSCOPIC-ADD ON

## 2010-07-14 LAB — TROPONIN I
Troponin I: 0.04 ng/mL (ref 0.00–0.06)
Troponin I: 0.05 ng/mL (ref 0.00–0.06)

## 2010-07-20 LAB — POCT I-STAT, CHEM 8
Calcium, Ion: 1.06 mmol/L — ABNORMAL LOW (ref 1.12–1.32)
Chloride: 99 mEq/L (ref 96–112)
Creatinine, Ser: 1.3 mg/dL (ref 0.4–1.5)
Glucose, Bld: 125 mg/dL — ABNORMAL HIGH (ref 70–99)
Potassium: 3.5 mEq/L (ref 3.5–5.1)

## 2010-07-20 LAB — CBC
Hemoglobin: 13.9 g/dL (ref 13.0–17.0)
MCV: 100.2 fL — ABNORMAL HIGH (ref 78.0–100.0)
RBC: 4.17 MIL/uL — ABNORMAL LOW (ref 4.22–5.81)
WBC: 6.5 10*3/uL (ref 4.0–10.5)

## 2010-07-20 LAB — DIFFERENTIAL
Lymphocytes Relative: 9 % — ABNORMAL LOW (ref 12–46)
Lymphs Abs: 0.6 10*3/uL — ABNORMAL LOW (ref 0.7–4.0)
Monocytes Absolute: 0.7 10*3/uL (ref 0.1–1.0)
Monocytes Relative: 11 % (ref 3–12)
Neutro Abs: 5.2 10*3/uL (ref 1.7–7.7)
Neutrophils Relative %: 79 % — ABNORMAL HIGH (ref 43–77)

## 2010-07-20 LAB — POCT CARDIAC MARKERS
CKMB, poc: 1.7 ng/mL (ref 1.0–8.0)
Troponin i, poc: <0.05 ng/mL (ref 0.00–0.09)

## 2010-08-04 ENCOUNTER — Encounter: Payer: Self-pay | Admitting: Cardiology

## 2010-08-18 NOTE — Discharge Summary (Signed)
Andrew [name], Andrew [name]               ACCOUNT NO.:  [id number]   MEDICAL RECORD NO.:  [id number]          PATIENT TYPE:  OBV   LOCATION:  4704                         FACILITY:  [facility]   PHYSICIAN:  Colleen Can. Deborah Chalk, M.D.DATE OF BIRTH:  1930/10/19   DATE OF ADMISSION:  11/18/2006  DATE OF DISCHARGE:  11/21/2006                               DISCHARGE SUMMARY   DISCHARGE DIAGNOSES:  1. Chest pain with subsequent elective cardiac catheterization showing      inferior hypokinesis with mild reduction of the global ejection      fraction, somewhat diffuse scattered three-vessel coronary disease      with 50-60% distal right coronary artery narrowing, 30-50% proximal      obtuse marginal, 50-60% proximal left anterior descending artery,      and 50% proximal diagonal vessel; these findings would suggest that      Andrew [name] is best managed medically.  2. Known atherosclerotic cardiovascular disease with previous history      of infarction dating back to 40.  He had recurrent inferior      infarction in 1999 and had stenting of the right coronary.  His      last hospitalization again with myocardial infarction in January      2007 and subsequently had stent placement x2 (Taxus stents to the      mid right coronary artery).  3. Ongoing tobacco abuse with no intentions of stopping.  4. Hypertension.  5. Peripheral vascular disease.  6. Hyperlipidemia.  7. Benign prostatic hypertrophy.  8. Reflux esophagitis.  9. Chronic obstructive pulmonary disease.   HISTORY OF PRESENT ILLNESS:  The patient is a 77 year old white male who  has multiple cardiovascular risk factors as well as known coronary  disease.  He has had multiple heart attacks with catheterization and  PCIs.  Unfortunately, he continues to smoke.  He presents to the  hospital with the onset of midsternal chest pain that occurred the  evening prior.  It awoke him from his sleep.  He took some Tums with  questionable relief.  He  had recurrent discomfort about an hour after  breakfast as well as with bending over.  He took nitroglycerin with  immediate relief.  He was subsequently advised to come to the emergency  room where he was subsequently seen and evaluated by Dr. Viann Fish  and was admitted for further evaluation.   Please see the dictated history and physical for further patient  presentation and profile.   LABORATORY DATA ON ADMISSION:  His cardiac enzymes were all negative.  His EKG showed evidence of old inferior MI with lateral changes.  He had  a gallbladder ultrasound as well as a HIDA scan that were both  unremarkable.  A chest x-ray showed mild cardiomegaly with COPD and  scarring in the apices.  There was a nodular opacity in the right upper  lobe; close followup is recommended.  His chemistries were all normal  except for a glucose of 117.   HOSPITAL COURSE:  The patient was admitted.  He ruled out negative for  myocardial infarction.  There was some concern that his discomfort was  GI related.  He subsequently had an ultrasound as well as a HIDA scan.  These were unremarkable.  Proton pump inhibitor was continued with his  medical regimen.  Cardiac catheterization was performed on November 21, 2006, to rule out coronary disease in light of the ongoing tobacco abuse  as well as known disease.  Cardiac catheterization was performed on  November 21, 2006.  Those results are as noted above.  Medical management  as well as smoking cessation were advised.  Postprocedure, he was  transferred back to telemetry after satisfactory bedrest.  He was felt  to be a stable candidate for discharge.   DISCHARGE CONDITION:  Stable.   DISCHARGE DIET:  Low salt, heart healthy.   He is strongly encouraged to stop smoking.   MEDICINES:  Will all be as he was taking before.  This includes:  1. Imdur 60 mg a day.  2. Aspirin daily.  3. Amiodarone 200 mg a day.  4. Cozaar 100 mg a day.  5. Norvasc 5 mg a  day.  6. Lipitor 20 mg a day.  7. Nexium 40 mg b.i.d.  8. Flomax 0.4 daily.  9. Lasix 40 mg a day.  10.Combivent inhaler as before.  11.Xanax.  12.Nitroglycerin p.r.n.  13.Multivitamin daily.  14.Plavix has been restarted at 75 mg a day.  15.Toprol-XL 25 mg a day.   We will plan on seeing him back in the office in approximately 1-2  weeks, certainly sooner if needed.  Attention will need to be given to  the chest x-ray results.      Sharlee Blew, N.P.      Colleen Can. Deborah Chalk, M.D.  Electronically Signed    LC/MEDQ  D:  12/19/2006  T:  12/19/2006  Job:  [id number]

## 2010-08-18 NOTE — H&P (Signed)
Andrew [name], Andrew [name]               ACCOUNT NO.:  [id number]   MEDICAL RECORD NO.:  [id number]          PATIENT TYPE:  OBV   LOCATION:  1829                         FACILITY:  [facility]   PHYSICIAN:  Georga Hacking, M.D.DATE OF BIRTH:  03/19/1931   DATE OF ADMISSION:  11/18/2006  DATE OF DISCHARGE:                              HISTORY & PHYSICAL   REASON FOR ADMISSION:  Chest discomfort.   HISTORY OF PRESENT ILLNESS:  The patient is a 77 year old male who is  admitted to rule out unstable angina. The patient has a history of  coronary artery disease dating back to 84. At that time he had an  infarction and underwent PTCA. In 1999, he had an inferior infarction  and had stenting of the right coronary artery. He presented to the  hospital with intermittent chest and left arm discomfort which was not  chest pain and his left arm discomfort has previously been his symptoms  of heart trouble. He was found to have 2 tandem stenoses in the right  coronary artery and underwent placement of a Vision stent in the right  coronary artery followed by 2 tandem Taxus stents. He tolerated this  fairly well. He also has a remote history of atrial fibrillation and has  had cardioversion in the past and is maintained on Amiodarone. He has  continued to smoke cigarettes.   He was in his usual state of health until he awoke last evening with  substernal chest discomfort. The discomfort was mid sternal and at the  time he took Tums and some antacids and the pain went away after about  an hour. About an hour after eating breakfast this morning, he had  recurrent mid-sternal discomfort described as pressure or tightness and,  again, took Tums and antacids and it went away. This afternoon after  bending over while soaking his feet in the tub, he had the onset of  similar discomfort, became frightened, called his wife as well as EMS.  They came and he was transported here, given nitroglycerin which  relieved his pain en route. He did not really have discomfort in his  left arm like he had in his previous episodes and was admitted at this  time to rule out an MI.   Initial enzymes are negative and EKG is unremarkable.   PAST MEDICAL HISTORY:  His past history is remarkable for  1. Hypertension.  2. Hyperlipidemia.  3. Benign prostatic hypertrophy.  4. Previous history of reflux esophagitis.  5. He has a history of peripheral vascular disease.  6. COPD.   PAST SURGICAL HISTORY:  Previous surgeries.  1. He has had salivary gland surgery.  2. Cataract extraction.  3. Dental surgery.   ALLERGIES:  SULFA.   CURRENT MEDICATIONS:  Include  1. Imdur 60 mg daily.  2. Aspirin daily.  3. Amiodarone 200 mg daily.  4. Cozaar 100 mg daily.  5. Norvasc 5 mg every other day.  6. Lipitor 20 mg daily.  7. Nexium 40 mg b.i.d.  8. Flomax 0.4 mg daily.  9. Lasix 40 mg daily.  10.Combivent inhaler b.i.d.  11.Xanax as needed.  12.Nitroglycerin.   Plavix was stopped last January.   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 He drinks rare social alcohol. He has a long history of  smoking, has no interest in quitting and smokes about a pack of  cigarettes per day. He is married to his second wife about 6 years. He  has 4 children from previous marriage.   FAMILY HISTORY:  Noncontributory.   REVIEW OF SYSTEMS:  Weight has been stable. He has chronic dry skin. He  has also had a recent bursa infection of his left elbow being treated  with antibiotics called Omnicef. He has had cataract extraction but no  glaucoma or other eye problems. No difficulty with hearing or  swallowing. He has a history of chronic reflux esophagitis and sees Dr.  Russella Dar. He has been told that he has some gallbladder trouble in the past  but has never had surgery for it. He also has some mild constipation. He  has a history of BPH and sees Dr. Isabel Caprice for this. He has urinary   frequency, hesitancy as well as some nocturia. He has a moderate amount  of arthritis. There is a history in the chart of peripheral vascular  disease and he has a soft bruit in his carotids. There is no history of  stroke or TIA. Other than as noted above, the remainder of the review of  systems is unremarkable.   PHYSICAL EXAMINATION:  GENERAL APPEARANCE: On examination he is a  pleasant male who appears mildly anxious.  VITAL SIGNS: Blood pressure on arrival was 180/94; is currently 140/90.  Pulse is currently 82.  SKIN: The skin is warm and dry and contains scattered hemangiomas,  occasional ecchymoses.  HEENT: EOMI. PERRLA. CNS clear. Fundi unremarkable. Pharynx negative.  NECK: Supple without masses. Soft, left carotid bruit noted.  LUNGS: Increased AP diameter, clear.  CARDIAC: Regular rhythm, normal S1, S2. No murmur.  ABDOMEN: Mild epigastric tenderness, soft and no severe tenderness  elsewhere. Femoral pulses are 2+. Distal pulses are 2-3+.  EXTREMITIES: He has changes of discoloration over his lower extremities  compatible with chronic venous insufficiency.  NEUROLOGICAL: Normal.   LABORATORY DATA:  EKG shows the previous inferior infarction, minor  nonspecific changes.   Chest x-ray shows COPD, questionable new nodule in right upper lung  zone.   Initial cardiac enzymes were negative.   IMPRESSION:  1. Recurrent upper chest and mid sternal discomfort. Different than      previous arm pain of unstable angina that he has had. Rule out      atypical presentation of coronary disease in this patient.  2. Coronary artery disease with previous stenting of the right      coronary artery in multiple locations and previous angioplasty.  3. Hypertension.  4. Hyperlipidemia under treatment.  5. Chronic obstructive pulmonary disease.  6. History of reflux.  7. Reported history of gallstones.  8. History of atrial fibrillation with previous cardioversion,      maintained on  Amiodarone.  9. Benign prostatic hypertrophy.   RECOMMENDATIONS:  The patient will be placed on Lovenox and if he has  recurrent pain will be placed on IV nitroglycerin. MI will be ruled out.  Likely will need repeat catheterization.      Georga Hacking, M.D.  Electronically Signed     WST/MEDQ  D:  11/18/2006  T:  11/18/2006  Job:  [id number]   cc:  Colleen Can. Deborah Chalk, M.D.  Lessie Dings

## 2010-08-18 NOTE — Discharge Summary (Signed)
Andrew [name], Andrew [name]               ACCOUNT NO.:  [id number]   MEDICAL RECORD NO.:  [id number]          PATIENT TYPE:  INP   LOCATION:  3708                         FACILITY:  [facility]   PHYSICIAN:  Santiago Bumpers. Hensel, M.D.DATE OF BIRTH:  [birth year]   DATE OF ADMISSION:  08/31/2008  DATE OF DISCHARGE:  09/04/2008                               DISCHARGE SUMMARY   PRIMARY CARE PHYSICIAN:  [name] L. Hal Hope, MD at [facility].   CARDIOLOGIST:  Colleen Can. Deborah Chalk, MD   DISCHARGE DIAGNOSES:  1. Community-acquired pneumonia.  2. Coronary artery disease.  3. Atrial fibrillation.  4. Hypertension.  5. Peripheral vascular disease.  6. BPH.  7. Hyperlipidemia.  8. Chronic renal insufficiency.   DISCHARGE MEDICATIONS:  1. Amiodarone 200 mg by mouth daily.  2. Aspirin 81 mg by mouth daily.  3. Plavix 75 mg every other day.  4. Advair 250/50 two puffs twice daily.  5. Spiriva 18 mcg capsule inhaled daily.  6. Flomax 0.4 mg daily.  7. Imdur 60 mg by mouth daily.  8. Lasix 40 mg by mouth daily.  9. Lipitor 20 mg by mouth daily.  10.Pepcid 40 mg by mouth daily.  11.Toprol-XL 25 mg by mouth daily.  12.Xanax 0.25 mg by mouth daily.  13.Albuterol 2 puffs every 4 hours as needed for wheeze.  14.Diovan 40 mg by mouth daily.   IMAGING:  CT angio on Sep 01, 2008.  Impression:  1. No evidence of acute pulmonary embolism.  2. Asymmetrical right greater than left airspace opacities most      compatible with pneumonia.  Radiographic followup is necessary to      exclude atypical neoplasm.  3. Interval development of right paratracheal and right hilar      adenopathy, possibly reactive to the suspected pneumonia.   LABORATORIES UPON DISCHARGE:  CBC 13.1 please note that the patient was  on steroids prior to discharge, hemoglobin 11.5, hematocrit 33.5,  platelets 217.  Sodium 133, potassium 4.5, chloride 102, CO2 of 26,  glucose 143, BUN 23, creatinine 1.14, calcium 8.8, BNP 433 which was  increased from admission when the BNP was 249 and this was due to the  patient's Lasix being held.  Urinalysis negative.  Cardiac enzymes  negative x3.   BRIEF HOSPITAL COURSE:  [name] is a very pleasant 77 year old  gentleman that presented with pleuritic pain.  Please see H&P for  further details.  This pleuritic pain was found to be due to his  pneumonia.  1. Pneumonia.  The patient was treated for community-acquired      pneumonia with a azithromycin and Rocephin for a total of 5 days of      azithromycin.  Prior to discharge, he was sating 96-99% on room air      consistently.  He remained afebrile.  Pulmonology was consulted.      We do appreciate their recommendations regarding possible atypical      neoplasm found on CT angiogram.  They recommended continuing the      patient's antibiotics and following up in 2-3 weeks for an  outpatient chest x-ray.  The patient was counseled to stop smoking      and he was managed with nebulizers and prednisone burst for his      COPD.  As the patient's clinical picture was unlikely due to      pulmonary fibrosis secondary to amiodarone use, his amiodarone was      continued at discharge.  Please note that the patient's white blood      cell count was 13.1 prior to discharge.  However, the patient was      receiving prednisone, he was clinically improved prior to      discharge.  2. Cardiac.  The patient has a significant cardiac history with 3 MIs      noted and stents in the past.  The last cath was in 2008 showing an      ejection fraction of 45% with inferior hypokinesis and diffuse 3-      vessel coronary artery disease.  An echo was done on September 03, 2008.      It was pending prior to discharge.  The patient also has a history      of atrial fibrillation and is on metoprolol and amiodarone.  His      aspirin and Plavix was continued during this hospital stay, and the      patient was noted not to be on Coumadin.  The patient  has an      upcoming appointment with his cardiologist, Dr. Deborah Chalk and this      will be forwarded to him as well.  3. Hypertension.  The patient's home medications were continued as      above.  4. BPH.  The patient's home medication of Flomax was continued.      Please note that his Foley was discontinued on the day prior to      discharge without difficulty.  5. Hyperlipidemia.  The patient's home medication of Lipitor was      continued.  6. Chronic renal insufficiency.  The patient was noted to have a      creatinine of 1.4 in early May 2010.  This was monitored carefully      as the patient did get a dye load with a CT angiogram.  His      creatinine decreased prior to discharge and was 1.14 on the day of      discharge.   FOLLOWUP:  The patient was instructed to follow up with his primary care  physician, Dr. Hal Hope at the [facility] as well as his  cardiologist.   Elenor Quinones ISSUES:  Repeat chest x-ray in 3-4 weeks to evaluate resolution  of pneumonia and evaluation of possible atypical neoplasm.      Helane Rima, MD  Electronically Signed      Santiago Bumpers. Leveda Anna, M.D.  Electronically Signed    EW/MEDQ  D:  09/04/2008  T:  09/04/2008  Job:  [id number]   cc:   Renato Battles. Deborah Chalk, M.D.

## 2010-08-18 NOTE — Cardiovascular Report (Signed)
NAMEBILLYJACK, Andrew [name]               ACCOUNT NO.:  [id number]   MEDICAL RECORD NO.:  [id number]          PATIENT TYPE:  OBV   LOCATION:  4704                         FACILITY:  [facility]   PHYSICIAN:  Colleen Can. Deborah Chalk, M.D.DATE OF BIRTH:  [birth year]   DATE OF PROCEDURE:  11/21/2006  DATE OF DISCHARGE:                            CARDIAC CATHETERIZATION   PROCEDURE:  Left heart catheterization with selective coronary  angiography and left ventricular angiography.   ACCESS SITE:  Percutaneous right femoral artery with Angio-Seal.   CATHETERS:  A 6-French, 4-curved, Judkins right and left coronary  catheters; 6-French pigtail ventriculographic catheter.   CONTRAST MATERIAL:  Omnipaque.   MEDICATIONS GIVEN PRIOR TO PROCEDURE:  Valium 2 mg.   MEDICATIONS GIVEN DURING PROCEDURE:  Versed 2 mg IV.   COMMENT:  The patient tolerated the future procedure well.   HEMODYNAMIC DATA:  Aortic pressure was 186/85, LV was 164/116.  There  was no aortic valve gradient noted on pullback.   ANGIOGRAPHIC DATA:  The left ventricular angiogram was performed in the  RAO position.  Overall cardiac size and silhouette were normal.  There  was inferior hypokinesia with a global ejection fraction estimated to be  at 45%.  There was no mitral regurgitation.   Coronary arteries rise and distribute normally.  It is a very large  dominant right coronary system.  1. Right coronary artery.  The right coronary artery has patent stents      throughout its proximal segments.  In the distal part of the right      coronary artery just prior to the posterolateral branch, there is      at least a 50-60% narrowing.  There is 30-40% narrowing in the      distal posterolateral branch and a 30-40% narrowing in the proximal      portions of the posterior descending artery.  There is scattered      irregularities in the more proximal portions of the right coronary      artery.  2. Left main coronary.  Left main coronary  artery is essentially      normal.  3. Left circumflex.  The left circumflex has somewhat diffuse      irregularities with 30-50% narrowing of the ostium of obtuse      marginal vessel.  There is scattered irregularities otherwise.  4. Left anterior descending.  The left anterior descending ends on the      anterior wall.  It has a 50% narrowing and its very proximal      portions.  There is a large diagonal vessel with 30-50% narrowing.      There is 30-50% narrowing in the proximal portions of the vessel      located near the first septal perforating branch.  The left      anterior descending itself courses to the apex.   IMPRESSION:  1. Inferior hypokinesis with mild reduction of global ejection      fraction.  2. Somewhat diffuse scattered three-vessel coronary disease with 50-      60% distal right coronary artery narrowing, 30-50% proximal  obtuse      marginal, 50-60% proximal left anterior descending and 50% proximal      diagonal vessel.   DISCUSSION:  These findings would suggest that [name] is best  managed medically.  We will encourage him to modify risk factors.  We  will continue Plavix therapy.  I will ask him in particular to stop  smoking.      Colleen Can. Deborah Chalk, M.D.  Electronically Signed     SNT/MEDQ  D:  11/21/2006  T:  11/22/2006  Job:  [id number]

## 2010-08-18 NOTE — H&P (Signed)
Andrew [name], Andrew [name]               ACCOUNT NO.:  [id number]   MEDICAL RECORD NO.:  [id number]          PATIENT TYPE:  INP   LOCATION:  3708                         FACILITY:  [facility]   PHYSICIAN:  Santiago Bumpers. Hensel, M.D.DATE OF BIRTH:  Jan 06, 1931   DATE OF ADMISSION:  08/31/2008  DATE OF DISCHARGE:                              HISTORY & PHYSICAL   REASON FOR HOSPITALIZATION:  Pleuritic chest pain.   PRIMARY CARE Burnice Oestreicher:  [name] L. Hal Hope, MD, at [facility].   CARDIOLOGIST:  Colleen Can. Deborah Chalk, MD   HISTORY OF PRESENT ILLNESS:  The patient is a 77 year old male with  history of diffuse coronary artery disease, status post myocardial  infarctions x3, COPD, and atrial fibrillation, controlled on amiodarone,  who presents from [facility], complaining of pleuritic chest  pain with breathing and coughing since Thursday, 2 days ago.  The  patient reports that initially the pain began bilaterally, but now is  worse on the left side of the chest, it is not present at rest, and it  is not associated with exertion.  It is worsened with cough and deep  breathing.  The patient denies any radiation to his neck or his jaw.  He  denies any nausea, vomiting, or diaphoresis.  There is no numbness or  tingling associated with this chest pain.  The patient does have a  significant history of recent car travel times several days from May 19  to May 26 throughout the [place] on vacation with his wife.  The  patient was sent over by Dr. Cleta Alberts at [facility] where  chest x-ray was performed and found to be significant for bilateral  lower lobe infiltrates.  Pulse oximetry was 92% on room air per Dr.  Cleta Alberts, and blood pressure was 110s/50s as well.  The patient has been  afebrile at Urgent Care and complains of no fevers at home.   ALLERGIES:  1. SULFA.  2. CODEINE.   MEDICATIONS:  1. Amiodarone 200 mg p.o. daily.  2. Aspirin 81 mg p.o.  daily.  3. Plavix 75 mg p.o. every other day.  4. Advair 250/50 two puffs b.i.d.  5. Spiriva 18 mcg capsule inhaled once daily.  6. Flomax 0.4 mg p.o. daily.  7. Imdur 60 mg p.o. daily.  8. Lasix 40 mg p.o. daily.  9. Lipitor 20 mg p.o. daily.  10.Nexium 40 mg p.o. daily.  11.Toprol-XL 25 mg p.o. daily.  12.Xanax 0.25 mg p.o. p.r.n.  13.Diovan, the patient has not been taking this medication for the      last week for borderline pressures with systolics in the 110s.   PAST MEDICAL HISTORY:  1. Coronary artery disease.  Last cath in August 2008, showing mild      inferior wall hypokinesis with an ejection fraction of 45%.  The      patient has diffuse 50% lesions throughout his cardiac vasculature.      He is seen by Dr. Deborah Chalk as an outpatient.  The patient is status      post myocardial  infarction in 1994, 1999, and 2007, and has a total      of 5 stents.  2. Atrial fibrillation controlled on amiodarone.  The patient appears      to be in sinus rhythm by clinical exam at this time.  We will check      an EKG.  3. Smoker.  The patient is trying to quit.  He is down to a few      cigarettes a day.  4. Hypertension.  5. Peripheral vascular disease.  6. Benign prostatic hyperplasia.  7. Hyperlipidemia.  8. COPD.  9. History of some renal insufficiency with creatinine of 1.4 earlier      this month May 2010.   PAST SURGICAL HISTORY:  1. Stents x5 as previously mentioned.  2. Cyst removed from the neck years ago.  3. Cataract surgery years ago.   SOCIAL HISTORY:  The patient lives with his second wife.  He has 4 kids  from the previous marriage.  He is retired..  The patient has a long  history of 1-pack per day smoking.  He is trying to decrease this at  this time.  The patient drinks alcohol occasionally in social settings.   FAMILY HISTORY:  Noncontributory at this point.   REVIEW OF SYSTEMS:  Negative for fevers, chills, sweats, orthopnea,  nausea, vomiting, diarrhea,  dysuria, rash, problems with vision, or  swelling above baseline.  Review of systems is positive for chest pain  previously mentioned, cough productive of scant greenish sputum, and  chronic constipation.   PHYSICAL EXAMINATION:  VITAL SIGNS:  Temperature 97.6, pulse 87,  respirations 20, blood pressure 108/60, pulse oximetry 94% on room air,  and weight is 91 kg.  GENERAL:  The patient is in no acute distress.  He is alert.  He is  pleasant and appropriate with exam.  HEENT:  Extraocular muscles are intact.  Pupils are equal, round, and  reactive to light, although small bilaterally.  There is no scleral  icterus.  NECK:  There is no JVD.  No carotid bruits.  CARDIOVASCULAR:  Regular rate and rhythm.  There are no murmurs.  Normal  S1 and S2.  LUNGS:  Work of breathing is unlabored, but the patient has diffusely  diminished breath sounds somewhat right greater than left.  There are no  rhonchi present.  There are occasional diffuse wheezes on auscultation.  ABDOMEN:  Positive bowel sounds, soft, nontender, and nondistended.  No  rebound.  EXTREMITIES:  There is moderate bilateral lower extremity pitting edema.  His lower extremities are symmetric.  There is no calf tenderness or  redness.  NEUROLOGIC:  The patient is alert and oriented.  His neurologic exam is  grossly intact.   LABORATORIES AND STUDIES:  We do not have any in-house labs or studies  at this time.  An outside x-ray shows bilateral lower lobe infiltrates  to my unofficial read and this is agreed with by Dr. Cleta Alberts.  The patient  does have a creatinine of 1.4 in May 2010.   ASSESSMENT AND PLAN:  The patient is a 77 year old male with pleuritic  chest pain in the context of coronary artery disease, chronic  obstructive pulmonary disease, and atrial fibrillation controlled on  amiodarone.  1. Chest pain.  Given the patient's travel history, we do need to      evaluate the patient for possible pulmonary embolism.  We  will      proceed with a D-dimer at this time.  Given that we have a good      answer for the patient's pleuritic chest pain in the form of lower      lobe pneumonia if the D-dimer is negative, we can likely avoid CT      or V/Q scan.  We will check an EKG and cycle cardiac enzymes given      the patient's cardiac risk factors and previous significant cardiac      history.  The patient's outside chest x-ray does show bilateral      lower lobe infiltrate and we will begin empiric treatment for      community-acquired pneumonia at this time.  2. Cardiac.  The patient has history of MI x3.  He has diffuse      coronary artery disease on the cath in August 2008.  We will check      an EKG and cycle cardiac enzymes.  We will start the heparin drip      if it is indicated, but the patient's chest pain is very atypical      at this point and I do not feel like that this will be likely      necessary.  We will continue aspirin.  We will continue with beta-      blocker, but we will decrease the dose of this.  The patient is on      Plavix, which we will continue.  We will also check the patient's      BNP to evaluate for heart failure, although the patient does not      seem to be decompensated clinically at this time.  3. Infectious disease.  The patient is afebrile.  We will check a      complete blood count and we will repeat his chest x-ray here.  We      will begin empiric treatment at this time for community-acquired      pneumonia.  4. Hypertension.  We will hold the patient's antihypertensives,      especially once I did increase the creatinine.  However, we will      continue beta-blocker at this time given cardiac risk factors,      although at a lower dose.  5. Chronic obstructive pulmonary disease.  When his O2 saturations are      stable, we will continue the patient's home regimen.  6. Tobacco abuse.  The patient is trying to quit.  He has reduced his      intake of cigarettes and  we will obtain a smoking cessation consult      in the hospital.  7. Fluids, electrolytes, nutrition and gastrointestinal.  The patient      appears clinically well hydrated, although we do not have any labs      at this point.  We will check creatinine to assess kidney function      and also electrolytes as well.   DISPOSITION:  We will evaluate the patient with a D-dimer, EKG, and  cardiac enzymes.  At this point, we will repeat the patient's chest x-  ray.  If D-dimer is positive, we will proceed with further evaluation  for pulmonary embolism.  If pulmonary embolism and cardiac issues are  ruled out, I would anticipate discharge from the hospital fairly soon  for this patient within the next 1-2 days.      Myrtie Soman, MD  Electronically Signed      Santiago Bumpers. Leveda Anna, M.D.  Electronically Signed  TE/MEDQ  D:  08/31/2008  T:  09/01/2008  Job:  [id number]

## 2010-08-18 NOTE — Consult Note (Signed)
Andrew [name], Andrew [name]               ACCOUNT NO.:  [id number]   MEDICAL RECORD NO.:  [id number]          PATIENT TYPE:  INP   LOCATION:  3708                         FACILITY:  [facility]   PHYSICIAN:  Coralyn Helling, MD        DATE OF BIRTH:  May 28, 1930   DATE OF CONSULTATION:  09/03/2008  DATE OF DISCHARGE:                                 CONSULTATION   REFERRING PHYSICIAN:  Santiago Bumpers. Hensel, MD   REASON FOR CONSULTATION:  Abnormal chest x-ray.   Mr. Duhe is a 77 year old male who was admitted on Aug 31, 2008, with  progressive dyspnea, cough, and pleuritic chest pain.  His initial chest  x-ray showed patchy bilateral airspace disease concerning for pneumonia.  He had a V/Q scan done which was intermediate probability.  He says that  he was on a recent trip to [place], [place], [place] and after  returning from this strip he noticed that he was having problems with  feeling fatigued, short of breath, and pleuritic-type chest pain on both  sides, but more on the right.  He did have a cough productive of clear  sputum.  He denies hemoptysis.  He did not notice any fevers at home,  but said his temperature was up to 100 during his hospitalization.  He  is also having some chest tightness and wheezing.  He denied any sweats  or chills.  He has not had any abdominal pain, nausea, vomiting, or  diarrhea.  He was started on antibiotic therapy for community-acquired  pneumonia and says that his breathing and his cough have improved,  although he still has some pleuritic-type chest pain.  He had a followup  chest x-ray on Sep 01, 2008, which showed some progression of his  airspace disease and as a result he had a CT scan of his chest from Sep 01, 2008, and this study was done with PE protocol.  It was negative for  pulmonary embolism.  He did have a right hilar node measuring 11 mm.  There was a right paratracheal node measuring 11 mm and there were  scattered areas of bilateral airspace  disease and as a result pulmonary  consultation was requested to determine if any further interventions  besides antibiotics were necessary.   PAST MEDICAL HISTORY:  Significant for:  1. Coronary artery disease, status post myocardial infarction x3,      status post stenting x5.  He is followed by Dr. Roger Shelter for      this.  2. Systolic heart failure with ejection fraction of 45%.  3. COPD with continued tobacco abuse.  4. Osteoarthritis.  5. Bursitis of his hips.  6. A 9-mm right middle lobe pulmonary nodule which was first      identified in 2008.  7. Chronic atrial fibrillation, on amiodarone therapy.  8. Hypertension.  9. Peripheral vascular disease.  10.Chronic renal insufficiency with a baseline creatinine of 1.4.   PAST SURGICAL HISTORY:  Significant for cyst removal.   SOCIAL HISTORY:  He is on his second marriage.  He is retired.  He  continues  to smoke cigarettes.   FAMILY HISTORY:  Noncontributory.   PHYSICAL EXAMINATION:  GENERAL:  He is seen in his hospital room.  He is  awake and alert.  He does not appear to be in any acute distress.  VITAL SIGNS:  Blood pressure is 170/89, heart rate is 81, respirations  are 18, temperature is 97.2, and oxygen saturation 94% on room air.  T-  max was 100.5 from Sep 01, 2008.  HEENT:  Pupils are reactive.  Extraocular muscles are intact.  There is  no sinus tenderness.  There are no oral lesions.  He wears dentures.  He  has Mallampati 3 airway.  There is no lymphadenopathy.  No thyromegaly.  No jugular venous distention.  HEART:  S1 and S2, irregular.  No murmur.  CHEST:  He has coarse breath sounds bilaterally, but there is no  wheezing.  ABDOMEN:  Obese, soft, and nontender.  EXTREMITIES:  No edema, cyanosis, or clubbing.  NEUROLOGIC:  He is alert and oriented x3 with normal strength and no  focal deficits were appreciated.   WBC was 17.6 on admission and is down to 11.8, hemoglobin is 11.4,  hematocrit 32.1, and  platelet count is 172.  Sodium is 134, potassium is  5, chloride is 100, CO2 is 26, BUN is 24, creatinine is 1, glucose is  143, and calcium is 9.2.  BNP is 433.   IMPRESSION:  1. Abnormal chest x-ray.  My suspicion is it is mostly related to      community-acquired pneumonia and he is currently on appropriate      antibiotic therapy for this.  I am less suspicious that this      represents an underlying malignancy.  What I would recommend is to      complete his course of antibiotic therapy and then he should have      followup imaging studies done in the next 3-4 weeks with a chest x-      ray.  If he has persistent abnormalities at that time, then      consideration may need to be given having him undergo bronchoscopy      with airway sampling, but I do not think that this is necessary at      this time.  I will therefore resume his diet and defer further      antibiotic therapy to the [facility].  2. Probable chronic obstructive pulmonary disease with continued      tobacco abuse.  I would continue him on his current inhaler      regimen.  He likely had an acute exacerbation associated with his      pneumonia and I will defer tapering of his prednisone dose to the      [facility].  3. A 9-mm pulmonary nodule, first seen in 2008.  He will need to have      followup imaging studies done later this year to document stability      of the nodule as well as to document that there is no significant      increase in his hilar lymphadenopathy seen on CT chest from this      admission.  Again, my suspicion with the hilar adenopathy and that      this is likely reactive related to his pneumonia, but this could be      further assessed with followup imaging studies   Otherwise, I do not think there is  any specific pulmonary interventions  that are needed beyond what has already been done by the [facility].  Please feel free to  call us if you have further  questions.      Coralyn Helling, MD  Electronically Signed     VS/MEDQ  D:  09/03/2008  T:  09/03/2008  Job:  [id number]

## 2010-08-18 NOTE — Assessment & Plan Note (Signed)
[facility]                         GASTROENTEROLOGY OFFICE NOTE   NAME:Bluestone, DAYTONA RETANA                      MRN:          [id number]  DATE:03/15/2007                            DOB:          04-16-1930    This is a return office visit for GERD with LPR.  His symptoms are under  excellent control on Nexium 40 mg p.o. b.i.d. He has tried taking Nexium  q.a.m. and Prilosec q.p.m. but this did not adequately control his  symptoms.  He notes no dysphagia, odynophagia, heart burn or abdominal  pain.   CURRENT MEDICATIONS:  Listed on the chart, updated and reviewed.   ALLERGIES:  SULFA DRUGS AND CODEINE.   EXAMINATION:  GENERAL:  In no acute distress.  VITAL SIGNS:  Weight 203, blood pressure 124/56, pulse 72 and regular.  CHEST:  Decreased breath sounds bilaterally.  CARDIAC:  Regular rate and rhythm without murmurs.  ABDOMEN:  Soft and nontender with normal active bowel sounds.   ASSESSMENT AND PLAN:  1. GERD with LPR.  Continue Nexium 40 mg p.o. b.i.d. with doses taken      30-45 minutes before breakfast and dinner. Continue standard      antireflux measures.  Return office visit 1 year.  2. Personal history of adenomatous colon polyps. Recall colonoscopy      recommended in January, 2012.     Venita Lick. Russella Dar, MD, [facility]  Electronically Signed    MTS/MedQ  DD: 03/15/2007  DT: 03/15/2007  Job #: [id number]

## 2010-08-21 NOTE — Cardiovascular Report (Signed)
Andrew [name], Andrew [name] NO.:  [id number]   MEDICAL RECORD NO.:  [id number]          PATIENT TYPE:  INP   LOCATION:  2913                         FACILITY:  [facility]   PHYSICIAN:  Vesta Mixer, M.D. DATE OF BIRTH:  1930-05-19   DATE OF PROCEDURE:  05/04/2005  DATE OF DISCHARGE:                              CARDIAC CATHETERIZATION   HISTORY OF PRESENT ILLNESS:  Andrew [name] is a 77 year old gentleman with a  previous history of an inferior wall myocardial infarction.  He presents  tonight with an acute coronary syndrome.  He had increased ST-segment in his  inferior leads and was brought emergently to the cath lab.   DESCRIPTION OF PROCEDURE:  The right femoral artery was easily cannulated  using modified Seldinger technique.   HEMODYNAMIC RESULTS:  __________ 146/13 with an aortic pressure of 146/61.   ANGIOGRAPHY:  A. Left main.  The left main has minor luminal irregularities.  B. Left anterior descending artery has minor luminal irregularities. There  is a 30% mid stenosis. There is a large diagonal vessel that has only minor  luminal irregularities.  C. Left circumflex artery is a moderate size vessel.  There is 30-40%  stenosis in the proximal mid segment.  The mid lesion is perhaps as much as  40-50%.  There is a moderate size obtuse marginal artery which is normal.  D. Right coronary artery is large and dominant.  There is a proximal stent  which is widely patent.  Following the stent there is a 95% stenosis in the  proximal vessel followed by a 95% stenosis in the mid vessel.  The vessel is  quite tortuous.  The posterior descending artery and posterolateral segment  artery are unremarkable.  E. Left ventriculogram was performed in a 30 RAO position.  It reveals  inferior wall akinesis.  The ejection fraction is 45%.  There is mild to  moderate left ventricular dilatation.  F. PTCA.  The patient was given an additional 2000 units of heparin.  He  received  5000 units of heparin down in the emergency room.  Integrilin was  initiated. The 6-French sheath was traded out for a 7-French sheath.  The  right coronary artery was cannulated using a Judkins right four sidehole  guide.  A BMW wire was placed down the vessel.   A 3.5x15 mm Maverick was positioned down in the mid vessel.  It was inflated  up to six atmospheres for 24 seconds followed by eight atmospheres for 17  seconds. The balloon was then pulled back and was inflated in the proximal  lesion for six atmospheres for 21 seconds.  This resulted in some  improvement.  There was obvious flap dissection in the mid lesion.  At this  point a Vision 4.0x15 mm stent was positioned across the distal most lesion  (mid vessel).  It was deployed at 12 atmospheres for 22 seconds.  We could  not visualize the stent.  It did not provide adequate scaffolding.  We  reviewed the films and are quite sure that it was not deployed in another  location.  It became clear that this stent was not going to provide enough  scaffolding.  We pulled out a 3.5x20 mm Taxus stent.  It was positioned in  the mid lesion. It was deployed at 18 atmospheres for 32 seconds.  A second  3.5x20 mm Taxus stent was positioned across the proximal stenosis and was  deployed at 18 atmospheres for third 30 seconds.  Following this post stent  dilatation was achieved using a 4.0x28 mm Quantum monorail.  The balloon was  positioned distally and was inflated up to 12 atmospheres for 12 seconds.  It was then pulled back slightly and inflated up to 16 atmospheres for 16  seconds followed by a third  inflation up to 16 atmospheres.  It was then  pulled to the proximal edge of the tandem stents and was inflated up to 14  atmospheres.  This resulted in a very nice angiographic result.  There is no  evidence of edge dissection.  We had no other complications.   CONCLUSION:  Successful PTCA and stenting of the proximal and mid right  coronary  artery.  We will give him 600 mg of Plavix.  He will continue  Integrilin.           ______________________________  Vesta Mixer, M.D.     PJN/MEDQ  D:  05/04/2005  T:  05/04/2005  Job:  [id number]   cc:   Colleen Can. Deborah Chalk, M.D.  Fax: [phone]

## 2010-08-21 NOTE — H&P (Signed)
NAME:  GIBRIL, MASTRO NO.:  [id number]   MEDICAL RECORD NO.:  [id number]          PATIENT TYPE:  INP   LOCATION:  1827                         FACILITY:  [facility]   PHYSICIAN:  Vesta Mixer, M.D. DATE OF BIRTH:  1930-05-07   DATE OF ADMISSION:  05/04/2005  DATE OF DISCHARGE:                                HISTORY & PHYSICAL   HISTORY OF PRESENT ILLNESS:  Andrew [name] is a 77 year old white male who  was admitted to [facility] for what appears to be an acute inferior  myocardial infarction.   The patient has a history of coronary artery disease which dates back to  68.  At that time he suffered a myocardial infarction and underwent PTCA.  In 1999 he suffered a second myocardial infarction and underwent stent  deployment.  The details of these events are not yet available as his prior  hospital chart is currently being located.  In 2000, he underwent  cardioversion for atrial fibrillation.   The patient presented to the emergency department after experiencing a 2-day  history of intermittent left arm discomfort.  Left arm discomfort, by the  way, and not chest pain, has been the symptom which heralded his prior  myocardial infarctions.  This has occurred for several hours at a time over  these 2 days.  He experienced additional left arm discomfort this afternoon.  He took 1 nitroglycerin tablet and it went away.  During the night, he  experienced a more severe episode of left arm discomfort; this was not  relieved with 1 nitroglycerin and prompted his visit to the emergency  department.  The left arm discomfort was not associated with dyspnea,  diaphoresis, or nausea.  There were no exacerbating or ameliorating factors.  It appeared not to be related to position, activity, meals, or respirations.  He reports no chest pain, tightness, heaviness, pressure or squeezing.  In  the emergency department, his electrocardiogram demonstrated progression of  acute inferior ST segment elevation.  Hence, he is admitted for further  management.   The patient also has hypertension and hyperlipidemia.  He continues to smoke  slightly less than a pack per day.   MEDICATIONS:  Amiodarone, Norvasc, Lipitor, Prevacid, aspirin, and  hydrochlorothiazide/triamterene.   ALLERGIES:  SULFA.   OPERATIONS:  Bilateral cataracts, and a parotid gland excision.   SOCIAL HISTORY:  The patient is retired and lives with his wife.  He does  not drink any alcohol.   FAMILY HISTORY:  Noncontributory.   REVIEW OF SYSTEMS:  Review of systems reveals peripheral vascular disease  which was just diagnosed within the last few days.  There are no new  problems related to his head, eyes, ears, nose, mouth, throat, lungs,  gastrointestinal system, or genitourinary system.  There is no history of  fever, chills or weight loss.   PHYSICAL EXAMINATION:  VITAL SIGNS:  Blood pressure 167/87.  Pulse 84 and  regular.  Respirations 19.  Temperature 98.1 degrees.  Pulse oximetry 98% on  2 L.  GENERAL:  The patient was an elderly white male in no discomfort.  He was  alert, oriented, appropriate, and responsive.  HEENT:  Head, eyes, nose, and mouth were normal.  NECK:  The neck was without thyromegaly or adenopathy.  Carotid pulses were  palpable bilaterally and without bruits.  CARDIAC:  Examination revealed a normal S1 and S2.  There was no S3, S4,  murmur, rub, or click.  Cardiac rhythm was regular.  No chest wall  tenderness was noted.  LUNGS:  The lungs were clear.  ABDOMEN:  The abdomen was soft and nontender.  There was no mass,  hepatosplenomegaly, bruit, distention, rebound, guarding, or rigidity.  Bowel sounds were normal.  RECTAL AND GENITAL:  Examinations were not performed as they were not  pertinent to the reason for acute care hospitalization.  EXTREMITIES:  The extremities were without edema, deviation, or deformity.  Radial and dorsalis pedal pulses were  palpable bilaterally.  NEUROLOGIC:  Brief screening neurologic survey was unremarkable.   LABORATORY AND ACCESSORY CLINICAL DATA:  The electrocardiogram revealed  inferior ST segment elevation.  There was progression from the first to the  second tracing performed tonight.  The ST segment elevation was more  pronounced than seen on the prior tracing of March 2003.  Today's tracing  also demonstrated evidence of prior septal and possible prior anterolateral  myocardial infarctions.   The chest radiograph report was pending at the time of this dictation.   Potassium was 3.8, BUN 10, and creatinine pending.  White count was 7.0 with  a hemoglobin of 13.9 and hematocrit of 40.3.  The remaining studies were  pending at the time of this dictation.   IMPRESSION:  1.  Acute inferior myocardial infarction.  2.  Coronary artery disease, status post myocardial infarction in 1994      resulting in percutaneous transluminal coronary angioplasty.  Status      post myocardial infarction in 1999 resulting in stenting.  3.  Status post atrial fibrillation in 2000, successfully cardioverted.  4.  Hypertension.  5.  Dyslipidemia.  6.  Peripheral vascular disease.  7.  Gastroesophageal reflux.   PLAN:  1.  Emergent cardiac catheterization; Dr. Elease Hashimoto has been advised.  2.  Intravenous heparin, intravenous nitroglycerin, intravenous Integrilin,      aspirin, and metoprolol in the interim.  3.  Necessity for discontinuation of smoking discussed with the patient.     Andrew Amor, MD  Electronically Signed     ______________________________  Vesta Mixer, M.D.   MSC/MEDQ  D:  05/04/2005  T:  05/04/2005  Job:  [id number]

## 2010-08-21 NOTE — Discharge Summary (Signed)
NAMEDERWIN, Andrew [name]               ACCOUNT NO.:  [id number]   MEDICAL RECORD NO.:  [id number]          PATIENT TYPE:  INP   LOCATION:  2031                         FACILITY:  [facility]   PHYSICIAN:  Colleen Can. Deborah Chalk, M.D.DATE OF BIRTH:  02-Jan-1931   DATE OF ADMISSION:  05/04/2005  DATE OF DISCHARGE:  05/08/2005                                 DISCHARGE SUMMARY   DISCHARGE DIAGNOSES:  1.  Acute inferior myocardial infarctions with emergent cardiac      catheterization per Dr. Kristeen Miss with subsequent stenting of the      right coronary artery.  2.  Hypokalemia/hyponatremia.  3.  Known atherosclerotic cardiovascular disease with previous myocardial      infarction.  Cardiac catheterizations and stent placement in 1994 and      1999.  4.  History of atrial fibrillation with previous cardioversion in 2000.  5.  Chronic antiarrhythmic use.  6.  Hypertension.  7.  COPD.  8.  Ongoing tobacco abuse.  9.  Chronic dizziness.  10. Chronic fatigue.  11. Gastroesophageal reflux disease.  12. Peripheral vascular disease.   HISTORY OF PRESENT ILLNESS:  The patient is a 77 year old white male who  presented to the hospital on May 04, 2005, with complaints of chest  pain.  He has a known history of coronary disease that dates back to 38.  He had myocardial infarction and underwent angioplasty at that time and had  a second heart attack in 1999 and had stent deployment.  He has had previous  cardioversions for atrial fibrillation approximately seven years ago.  He  presented to the emergency department with a two day history of intermittent  left arm pain which is primarily his chest pain syndrome.  He took  nitroglycerin intermittently, only for the discomfort to return.  In the  emergency room, he demonstrated acute inferior ST segment elevation and was  admitted for further evaluation.  Please see dictated History and Physical  for further patient presentation and profile.   LABORATORY DATA:  On admission. EKG showed normal sinus rhythm with ST  elevation inferiorly.  Potassium 3.8, BUN 10, white count 7, hemoglobin 13,  hematocrit 40.   HOSPITAL COURSE:  The patient was admitted.  He emergently underwent cardiac  catheterization which was performed by Dr. Kristeen Miss.  That procedure  was tolerated well without any known complications.  The findings are as  follows.  The left main has minor luminal irregularities.  The LAD had a 30%  mid stenosis with a large diagonal left one that had only minor luminal  irregularities.  The left circumflex was a moderate sized vessel with a 30-  40% narrowing of the proximal mid portion.  There was a moderate sized  obtuse marginal artery which was normal.  The right coronary artery was  large and dominant.  There was a proximal stent that was widely patent.  Following the stent, there was a 95% stenosis in the proximal portion  followed by a 95% stenosis in the mid vessel.  The vessel was quite  tortuous.  Posterior descending and posterolateral segments  were  unremarkable  The ejection fraction was 45%.  There was inferior wall  akinesis.   Subsequent angioplasty and stent placement was performed with a 4.0x15 mm  vision stent which was positioned across the distal most lesion (mid  vessel); however, this stent was not able to be visualized, and it did not  provide adequate scaffolding.  Films were reviewed, and it was unsure if it  had been deployed in another location.  However, it was quite clear that the  stent was not going to provide enough scaffolding, and subsequently a 3.5x20  mm TAXUS stent was deployed and positioned in the mid lesion.  A second  3.5x20 mm TAXUS stent was positioned across the proximal stenosis.  The  overall final angiographic result was felt to be excellent.  He was given  Integralin, was started on Plavix and was transferred to the Coronary Care  Unit for further monitoring.   By the  following day, the patient was doing well without complaints.  Groin  was unremarkable.  Cardiac enzymes were checked.  He had a peak troponin of  1.64 with a peak MB of 8.0.  CBC remained stable.  He was noted to be  hypokalemic as well as hyponatremic.  Hydrochlorothiazide was subsequently  stopped, and the patient was placed on Lasix as well as potassium  supplementation.  On May 06, 2005, he had had several hours of left arm  pain that was less intense but similar to his previous chest pain syndrome.  He was somewhat anxious on physical exam.  Blood pressure was elevated.  His  EKG showed sinus rhythm with PVC's and inferior Q-waves.  His transfer to  the floor was held, and he was placed on oral nitrate therapy.  The patient  did have recurrent pain that following evening, but following that had no  further episodes.  He was subsequently transferred to the floor.  Cardiac  rehab was initiated.  He was able to be ambulatory without problems.  On  May 08, 2005, was felt to be a satisfactory candidate for discharge.   CONDITION ON DISCHARGE:  Stable.   DISCHARGE INSTRUCTIONS:  1.  Diet:  Heart healthy.  2.  Activity:  He is not to drive or engage in sexual activity.  He was      given the okay to walk 5-10 minutes two times a day.  He was encouraged      to not smoke.   DISCHARGE MEDICATIONS:  1.  Imdur 60 mg daily.  2.  Aspirin daily.  3.  Amiodarone 200 mg daily.  4.  Will be stopping Hyzaar.  5.  Cozaar 100 mg daily.  6.  Norvasc 5 mg daily.  7.  Lipitor 80 mg daily.  8.  Nexium 40 mg daily.  9.  Flomax 0.4 mg daily.  10. Allegra p.r.n.  11. Multivitamin daily.  12. Plavix 75 mg daily.  13. Lasix 40 mg daily.  14. Toprol XL 25 mg daily.  15. Potassium 20 mEq daily.  16. Combivent as before.  17. Xanax as before.  18. Nitroglycerin as before.   PLAN:  We plan on seeing him back in the office in one week, certainly sooner if he has any recurrence of  problems.      Sharlee Blew, N.P.      Colleen Can. Deborah Chalk, M.D.  Electronically Signed    LC/MEDQ  D:  08/31/2005  T:  08/31/2005  Job:  [id number]

## 2010-08-21 NOTE — Assessment & Plan Note (Signed)
[facility]                         GASTROENTEROLOGY OFFICE NOTE   NAME:Andrew [name], DAMOND BORCHERS                      MRN:          [id number]  DATE:03/21/2006                            DOB:          1930/05/27    This is a return office visit for GERD complicated by LPR. His symptoms  are under excellent control on Nexium 40 mg p.o. b.i.d. He states that  he is having a difficult time affording Nexium twice a day and would  like to try something less expensive if possible.   CURRENT MEDICATIONS:  As listed on the chart; updated and reviewed.   MEDICATION ALLERGIES:  SULFA DRUGS AND CODEINE.   PHYSICAL EXAMINATION:  In no acute distress. Weight is 206.8 pounds,  blood pressure is 140/60, pulse is 65 and regular.  CHEST: Clear to auscultation bilaterally.  CARDIAC: Regular rate and rhythm without murmurs appreciated.  ABDOMEN: Soft and nontender with normoactive bowel sounds. No palpable  organomegaly, masses or hernias.   ASSESSMENT/PLAN:  Gastroesophageal reflux disease complicated by  laryngopharyngeal reflux (LPR). Trial of Prilosec 20 mg over-the-counter  1 per mouth twice daily taken 30 to 60 minutes before breakfast and  dinner, along with standard anti-reflux measures. If his symptoms remain  under adequate control, he can remain on this regimen longterm.  Otherwise, he will return to Nexium 40 mg per mouth twice daily. Return  office visit in 1 year.     Venita Lick. Russella Dar, MD, [facility]  Electronically Signed    MTS/MedQ  DD: 03/21/2006  DT: 03/21/2006  Job #: [id number]

## 2010-08-26 ENCOUNTER — Other Ambulatory Visit: Payer: Self-pay | Admitting: Cardiology

## 2010-08-26 NOTE — Telephone Encounter (Signed)
[facility] called for approval of Plavix 75mg  on tab every other day dispense 45 with one refill.

## 2010-09-25 ENCOUNTER — Encounter: Payer: Self-pay | Admitting: Nurse Practitioner

## 2010-10-05 ENCOUNTER — Other Ambulatory Visit: Payer: Self-pay | Admitting: Cardiology

## 2010-10-05 MED ORDER — METOPROLOL TARTRATE 25 MG PO TABS: ORAL_TABLET | ORAL | Status: DC

## 2010-10-05 NOTE — Telephone Encounter (Signed)
Med refill

## 2010-10-09 ENCOUNTER — Other Ambulatory Visit: Payer: Self-pay | Admitting: *Deleted

## 2010-10-09 ENCOUNTER — Telehealth: Payer: Self-pay | Admitting: Cardiology

## 2010-10-09 DIAGNOSIS — R0602 Shortness of breath: Secondary | ICD-10-CM

## 2010-10-09 DIAGNOSIS — E785 Hyperlipidemia, unspecified: Secondary | ICD-10-CM

## 2010-10-09 NOTE — Telephone Encounter (Signed)
[facility]  Has question re pt metoprolol. phone# [phone] ref# [id number].

## 2010-10-09 NOTE — Telephone Encounter (Signed)
Spoke with Synetta Fail at [facility] card and they are looking at his chart. He has 2 different types of metoprolol listed and the pharmacy states he is taking 1 25mg  tablet daily. Pt is a former [employer] and has not been seen by Dr. Antoine Poche yet. They will call back to verify dose.

## 2010-10-14 ENCOUNTER — Ambulatory Visit (INDEPENDENT_AMBULATORY_CARE_PROVIDER_SITE_OTHER): Payer: Self-pay | Admitting: Nurse Practitioner

## 2010-10-14 ENCOUNTER — Telehealth: Payer: Self-pay | Admitting: Cardiology

## 2010-10-14 ENCOUNTER — Encounter: Payer: Self-pay | Admitting: Nurse Practitioner

## 2010-10-14 ENCOUNTER — Other Ambulatory Visit (INDEPENDENT_AMBULATORY_CARE_PROVIDER_SITE_OTHER): Payer: Self-pay | Admitting: *Deleted

## 2010-10-14 ENCOUNTER — Other Ambulatory Visit: Payer: Self-pay | Admitting: Nurse Practitioner

## 2010-10-14 VITALS — BP 112/68 | HR 84 | Ht 72.0 in | Wt 186.8 lb

## 2010-10-14 DIAGNOSIS — I2589 Other forms of chronic ischemic heart disease: Secondary | ICD-10-CM

## 2010-10-14 DIAGNOSIS — I255 Ischemic cardiomyopathy: Secondary | ICD-10-CM

## 2010-10-14 DIAGNOSIS — I4892 Unspecified atrial flutter: Secondary | ICD-10-CM

## 2010-10-14 DIAGNOSIS — E785 Hyperlipidemia, unspecified: Secondary | ICD-10-CM

## 2010-10-14 DIAGNOSIS — R0602 Shortness of breath: Secondary | ICD-10-CM

## 2010-10-14 DIAGNOSIS — I4891 Unspecified atrial fibrillation: Secondary | ICD-10-CM

## 2010-10-14 DIAGNOSIS — I472 Ventricular tachycardia, unspecified: Secondary | ICD-10-CM

## 2010-10-14 LAB — HEPATIC FUNCTION PANEL
ALT: 22 U/L (ref 0–53)
AST: 24 U/L (ref 0–37)
Albumin: 3.7 g/dL (ref 3.5–5.2)
Alkaline Phosphatase: 87 U/L (ref 39–117)
Bilirubin, Direct: 0.1 mg/dL (ref 0.0–0.3)
Total Bilirubin: 0.6 mg/dL (ref 0.3–1.2)
Total Protein: 6.9 g/dL (ref 6.0–8.3)

## 2010-10-14 LAB — BASIC METABOLIC PANEL
BUN: 27 mg/dL — ABNORMAL HIGH (ref 6–23)
CO2: 31 mEq/L (ref 19–32)
Calcium: 8.5 mg/dL (ref 8.4–10.5)
Chloride: 103 mEq/L (ref 96–112)
Creatinine, Ser: 1.2 mg/dL (ref 0.4–1.5)
GFR: 62.18 mL/min (ref >60.00)
Glucose, Bld: 126 mg/dL — ABNORMAL HIGH (ref 70–99)
Potassium: 4.2 mEq/L (ref 3.5–5.1)
Sodium: 138 mEq/L (ref 135–145)

## 2010-10-14 LAB — LIPID PANEL
Cholesterol: 205 mg/dL — ABNORMAL HIGH (ref 0–200)
HDL: 45.2 mg/dL (ref >39.00)
Total CHOL/HDL Ratio: 5
Triglycerides: 99 mg/dL (ref 0.0–149.0)
VLDL: 19.8 mg/dL (ref 0.0–40.0)

## 2010-10-14 LAB — BRAIN NATRIURETIC PEPTIDE: Pro B Natriuretic peptide (BNP): 795 pg/mL — ABNORMAL HIGH (ref 0.0–100.0)

## 2010-10-14 LAB — LDL CHOLESTEROL, DIRECT: Direct LDL: 155.7 mg/dL

## 2010-10-14 MED ORDER — METOPROLOL SUCCINATE ER 25 MG PO TB24: ORAL_TABLET | ORAL | Status: DC

## 2010-10-14 NOTE — Assessment & Plan Note (Signed)
Has history of atrial fib and flutter. Has been off of Amiodarone since September of 2011. Now noting more tachycardia and irregularity of his pulse. He is not on coumadin. He does take Plavix every other day. Will place an event monitor. Toprol is increased to every day. I will have him see Dr. Antoine Poche in about 4 to 5 weeks after the monitor. Patient is agreeable to this plan and will call if any problems develop in the interim.

## 2010-10-14 NOTE — Progress Notes (Signed)
Andrew [name] Date of Birth: 1931/01/15   History of Present Illness: Andrew [name] is seen back today for a follow up visit. He is seen for Dr. Antoine Poche. He is a former patient of Dr. Ronnald Nian. He has a complex medical history that includes severe COPD, CAD, remote MI and CHF. EF was 30 to 25% per echo in December of 2011 and 48% by nuclear in 2010. He has had atrial fib/flutter and was on amiodarone in the past. He has been off amiodarone since September of 2011 due to markedly abnormal LFT's.   He is here with his wife Andrew [name]. He is not doing well. He went on a trip to [place] about 10 days ago. He got very short of breath before getting in the car to take the trip. He took a nebulizer and got better. He went on the trip. He was out in the heat. On Saturday he had another spell of not being able to breath and was taken to the ER. We do not have those records. He was given what sounds like IV lasix and a steroid dose pack. He came home this past Monday and has gotten better with each day. He has noted since his last visit more tachycardia. He is worried about recurrent arrhythmia. The amiodarone is certainly out of his system.   His medicines were reviewed. For some unknown reason he has been taking 100 mg of Cozaar BID. He only uses 12.5 mg of Toprol every other day.  He no longer takes Norvasc.  Current Outpatient Prescriptions on File Prior to Visit  Medication Sig Dispense Refill  . acetaminophen (TYLENOL) 500 MG tablet Take 500 mg by mouth every 6 (six) hours as needed.        [name] albuterol (2.5 MG/3ML) 0.083% NEBU 3 mL, albuterol (5 MG/ML) 0.5% NEBU 0.5 mL Inhale into the lungs. 2-3 times daily       . Albuterol Sulfate (PROAIR HFA IN) Inhale into the lungs as needed. One tablet every other day      . ALPRAZolam (XANAX) 0.25 MG tablet Take 1 tablet (0.25 mg total) by mouth at bedtime as needed.  30 tablet  1  . Alum & Mag Hydroxide-Simeth (MAALOX MAXIMUM STRENGTH PO) Take by mouth.        [name]  aspirin 81 MG tablet Take 81 mg by mouth daily.        [name] bismuth subsalicylate (PEPTO BISMOL) 262 MG/15ML suspension Take 15 mLs by mouth every 6 (six) hours as needed.        . calcium carbonate (TUMS EX) 750 MG chewable tablet Chew by mouth daily.        . clopidogrel (PLAVIX) 75 MG tablet Take 75 mg by mouth every other day.        . clotrimazole (MYCELEX) 10 MG troche Take 10 mg by mouth 2 (two) times daily.        [name] Dextromethorphan-Guaifenesin (ROBITUSSIN COLD COUGH+ CHEST PO) Take by mouth.        . esomeprazole (NEXIUM) 40 MG capsule Take 40 mg by mouth 2 (two) times daily.        . Fluticasone-Salmeterol (ADVAIR HFA IN) Inhale into the lungs 2 (two) times daily.       . furosemide (LASIX) 40 MG tablet Take 40 mg by mouth 2 (two) times daily. One each day and then one extra for weight over 186      . HYDROcodone-acetaminophen (VICODIN) 5-500 MG per tablet Take  1 tablet by mouth every 6 (six) hours as needed.        [name] losartan (COZAAR) 100 MG tablet Take 100 mg by mouth daily. One tablet every other day      . magnesium hydroxide (MILK OF MAGNESIA) 400 MG/5ML suspension Take by mouth daily as needed.        . meclizine (ANTIVERT) 32 MG tablet Take 25 mg by mouth 3 (three) times daily as needed.        . methylPREDNISolone (MEDROL) 4 MG tablet Take 4 mg by mouth daily.        . metroNIDAZOLE (METROCREAM) 0.75 % cream Apply topically daily.        [name] PRENATAL VITAMINS PO Take by mouth daily.        . Simethicone (GAS-X ULTRA STRENGTH PO) Take by mouth.        . Tamsulosin HCl (FLOMAX) 0.4 MG CAPS Take 0.4 mg by mouth daily.        [name] DISCONTD: metoprolol (TOPROL-XL) 25 MG 24 hr tablet Take 25 mg by mouth. 1/2 every other day      . DISCONTD: metoprolol tartrate (LOPRESSOR) 25 MG tablet Take 1/2 tablet every other day   45 tablet  3    Allergies  Allergen Reactions  . Codeine   . Sulfonamide Derivatives     Past Medical History  Diagnosis Date  . LVF (left ventricular failure)      chronic LV systolic failure  . Ischemic heart disease   . Atrial flutter     off amiodarone because of increased LFTs  . Shortness of breath     chronic sob with known COPD and probable chronic bronchitis  . COPD (chronic obstructive pulmonary disease)   . Bronchitis, chronic   . Myocardial infarction 1994  . Gastro - esophageal reflux   . Claudication   . BPH (benign prostatic hyperplasia)   . CHF (congestive heart failure)   . Coronary artery disease   . Hypertension     Past Surgical History  Procedure Date  . Cardiac catheterization 2008  . Cataract extraction   . Salivary gland surgery     History  Smoking status  . Former Smoker  . Quit date: 01/03/2009  Smokeless tobacco  . Never Used    History  Alcohol Use No    Family History  Problem Relation Age of Onset  . Coronary artery disease Father   . Heart disease Mother     Review of Systems: The review of systems is positive for recent shortness of breath ? CHF exacerbation versus COPD. He was treated for both. He has had abnormal CT of the chest, last performed by his PCP and told that he did not need follow up. He does bruise. He uses extra Lasix for weights about 186.  All other systems were reviewed and are negative.  Physical Exam: BP 112/68  Pulse 84  Ht 6' (1.829 m)  Wt 186 lb 12.8 oz (84.732 kg)  BMI 25.33 kg/m2 Patient is very pleasant and in no acute distress. He does appear chronically ill. Skin is warm and dry. Color is normal. He is ecchymotic.  HEENT is unremarkable. Normocephalic/atraumatic. PERRL. Sclera are nonicteric. Neck is supple. No masses. No JVD. Lungs are fairly clear with decreased breath sounds.  Cardiac exam shows a regular rate and rhythm today. Abdomen is soft. Extremities are without edema. Gait and ROM are intact. No gross neurologic deficits noted.  LABORATORY DATA: PENDING  Assessment / Plan:

## 2010-10-14 NOTE — Assessment & Plan Note (Signed)
I suspect this recent episode was both CHF and COPD. BNP is checked today. He is improving clinically. I have changed his Cozaar back to just 100 mg per day.

## 2010-10-14 NOTE — Telephone Encounter (Signed)
Pt saw Norma Fredrickson, NP this am who changed his RX to metoprolol tartrate 25 mg 1/2 every day.  Minerva Ends, pharmacist with [facility] aware and states this is the RX sent out today.

## 2010-10-14 NOTE — Patient Instructions (Addendum)
Take only the losartan once a day Increase your Toprol to just half a tablet every day We are going to put a monitor on for the next month I will have you see Dr. Angelina Sheriff in 5 weeks. He is at the main office [address] Call for any problems

## 2010-10-14 NOTE — Telephone Encounter (Signed)
Per [facility] pharmacy calling with questions regarding metoprolol RX directions.   Pharmacy said they spoke w/ Synetta Fail previously about this matter.   Please return call no later than Friday, July 13th

## 2010-10-16 ENCOUNTER — Telehealth: Payer: Self-pay | Admitting: *Deleted

## 2010-10-16 DIAGNOSIS — E7800 Pure hypercholesterolemia, unspecified: Secondary | ICD-10-CM

## 2010-10-16 MED ORDER — PRAVASTATIN SODIUM 20 MG PO TABS: 20.0000 mg | ORAL_TABLET | Freq: Every evening | ORAL | Status: DC

## 2010-10-16 NOTE — Telephone Encounter (Signed)
Notified of lab results. Will start on Pravachol 20 mg. Sent to [facility]. Also sent him a copy of labs done 10/14/10. Will get fasting labs, LP,BMET,HFP on visit w/ Dr. Antoine Poche

## 2010-10-16 NOTE — Telephone Encounter (Signed)
Message copied by Lorayne Bender on Fri Oct 16, 2010 10:47 AM ------      Message from: Rosalio Macadamia      Created: Wed Oct 14, 2010  3:51 PM       Ok to report. BNP looks to be around his baseline. Extra Lasix as prescribed.

## 2010-10-19 ENCOUNTER — Other Ambulatory Visit: Payer: Self-pay | Admitting: Cardiology

## 2010-10-19 MED ORDER — METOPROLOL SUCCINATE ER 25 MG PO TB24: ORAL_TABLET | ORAL | Status: DC

## 2010-10-19 NOTE — Telephone Encounter (Signed)
escribe medication per fax request  

## 2010-10-19 NOTE — Telephone Encounter (Signed)
Called needing a refill of Metoprolol faxed into [facility]. Please call back. I have pulled his chart.

## 2010-10-28 ENCOUNTER — Encounter: Payer: Self-pay | Admitting: Cardiology

## 2010-11-03 ENCOUNTER — Encounter: Payer: Self-pay | Admitting: Cardiology

## 2010-11-04 ENCOUNTER — Encounter: Payer: Self-pay | Admitting: Cardiology

## 2010-11-05 ENCOUNTER — Encounter: Payer: Self-pay | Admitting: Cardiology

## 2010-11-05 ENCOUNTER — Telehealth: Payer: Self-pay | Admitting: *Deleted

## 2010-11-05 ENCOUNTER — Telehealth: Payer: Self-pay | Admitting: Nurse Practitioner

## 2010-11-05 DIAGNOSIS — I472 Ventricular tachycardia: Secondary | ICD-10-CM | POA: Insufficient documentation

## 2010-11-05 NOTE — Telephone Encounter (Signed)
Called stating he forgot to tell me this morning that he developed a cough yest am. Non-productive, hacking cough. No temp. Was wondering if that could cause HR to be elevated. Advised that it could. Most of tracings show PVC's but with increase of Metoprolol should help that. Doesn't feel bad and cough is better today.

## 2010-11-05 NOTE — Telephone Encounter (Signed)
Pt wishes to let Synetta Fail know of a new cough that came up yesterday, pt coughed all night last night, pt thinks it has something to do with his heart rate, pt wishes for you to call him back,pt states didn't think to tell you this an hour ago when you last spoke

## 2010-11-05 NOTE — Telephone Encounter (Signed)
Received readings from [facility] w/ increase heart rate. Per [name] will increase Metoprolol Succ to 25 mg daily. Notified [name]

## 2010-11-05 NOTE — Assessment & Plan Note (Signed)
Patient has had an event monitor on since July 11th. PVC's are noted. 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 It was a 13 beat run. Toprol will be increased to 25 mg every day.

## 2010-11-06 ENCOUNTER — Encounter: Payer: Self-pay | Admitting: Cardiology

## 2010-11-13 ENCOUNTER — Encounter: Payer: Self-pay | Admitting: Cardiology

## 2010-11-14 ENCOUNTER — Other Ambulatory Visit: Payer: Self-pay | Admitting: Cardiology

## 2010-11-19 ENCOUNTER — Encounter: Payer: Self-pay | Admitting: Cardiology

## 2010-11-26 ENCOUNTER — Encounter: Payer: Self-pay | Admitting: Cardiology

## 2010-11-26 ENCOUNTER — Other Ambulatory Visit (INDEPENDENT_AMBULATORY_CARE_PROVIDER_SITE_OTHER): Payer: Medicare Other | Admitting: *Deleted

## 2010-11-26 ENCOUNTER — Ambulatory Visit (INDEPENDENT_AMBULATORY_CARE_PROVIDER_SITE_OTHER): Payer: Medicare Other | Admitting: Cardiology

## 2010-11-26 DIAGNOSIS — I472 Ventricular tachycardia, unspecified: Secondary | ICD-10-CM

## 2010-11-26 DIAGNOSIS — I255 Ischemic cardiomyopathy: Secondary | ICD-10-CM

## 2010-11-26 DIAGNOSIS — I10 Essential (primary) hypertension: Secondary | ICD-10-CM

## 2010-11-26 DIAGNOSIS — I428 Other cardiomyopathies: Secondary | ICD-10-CM

## 2010-11-26 DIAGNOSIS — R0989 Other specified symptoms and signs involving the circulatory and respiratory systems: Secondary | ICD-10-CM

## 2010-11-26 DIAGNOSIS — I2589 Other forms of chronic ischemic heart disease: Secondary | ICD-10-CM

## 2010-11-26 DIAGNOSIS — R0609 Other forms of dyspnea: Secondary | ICD-10-CM

## 2010-11-26 DIAGNOSIS — E785 Hyperlipidemia, unspecified: Secondary | ICD-10-CM

## 2010-11-26 DIAGNOSIS — E7800 Pure hypercholesterolemia, unspecified: Secondary | ICD-10-CM

## 2010-11-26 LAB — COMPREHENSIVE METABOLIC PANEL
AST: 19 U/L (ref 0–37)
Albumin: 3.1 g/dL — ABNORMAL LOW (ref 3.5–5.2)
Alkaline Phosphatase: 94 U/L (ref 39–117)
BUN: 18 mg/dL (ref 6–23)
Calcium: 8.7 mg/dL (ref 8.4–10.5)
Chloride: 102 mEq/L (ref 96–112)
Glucose, Bld: 94 mg/dL (ref 70–99)
Potassium: 4.2 mEq/L (ref 3.5–5.1)
Sodium: 138 mEq/L (ref 135–145)
Total Protein: 6.3 g/dL (ref 6.0–8.3)

## 2010-11-26 LAB — LIPID PANEL
Cholesterol: 144 mg/dL (ref 0–200)
VLDL: 15.4 mg/dL (ref 0.0–40.0)

## 2010-11-26 LAB — HEPATIC FUNCTION PANEL
ALT: 14 U/L (ref 0–53)
Bilirubin, Direct: 0.1 mg/dL (ref 0.0–0.3)
Total Bilirubin: 0.5 mg/dL (ref 0.3–1.2)
Total Protein: 6.3 g/dL (ref 6.0–8.3)

## 2010-11-26 MED ORDER — METOPROLOL SUCCINATE ER 50 MG PO TB24: 50.0000 mg | ORAL_TABLET | Freq: Every day | ORAL | Status: DC

## 2010-11-26 NOTE — Assessment & Plan Note (Signed)
This is being treated in the context of treating his cardiomyopathy.

## 2010-11-26 NOTE — Patient Instructions (Signed)
Please increase Toprol XL to 50 mg a day Continue all other medications the same as listed.  Follow up with Andrew [name] in 2 weeks.

## 2010-11-26 NOTE — Assessment & Plan Note (Signed)
I reviewed his lipids today and they were at target.

## 2010-11-26 NOTE — Assessment & Plan Note (Signed)
A BNP was drawn but the results are not back yet.  I will follow up.  I will titrate meds as mentioned.

## 2010-11-26 NOTE — Progress Notes (Signed)
HPI The patient presents for followup of his known coronary disease. He was previously seen by Dr. Deborah Chalk.  He has a history of CAD.  Recently he wore an event monitor to evaluate palpitations.  It did not demonstrate the previous atrial flutter or fibrillation.  However, it did show frequent PVCs.  He does feel some palpitations.  However, he has had no presyncope or syncope.  He does get dyspnea with 100 yards.  He does not have PND or orthopnea.  He has had no chest pain, neck or arm pain.  At the last visit he had up titration of his beta blocker. He tolerated this.  Allergies  Allergen Reactions  . Codeine   . Sulfonamide Derivatives     Current Outpatient Prescriptions  Medication Sig Dispense Refill  . acetaminophen (TYLENOL) 500 MG tablet Take 500 mg by mouth every 6 (six) hours as needed.        [name] albuterol (2.5 MG/3ML) 0.083% NEBU 3 mL, albuterol (5 MG/ML) 0.5% NEBU 0.5 mL Inhale into the lungs. 2-3 times daily       . Albuterol Sulfate (PROAIR HFA IN) Inhale into the lungs as needed. One tablet every other day      . Alpha-D-Galactosidase (BEANO PO) Take by mouth.        . ALPRAZolam (XANAX) 0.25 MG tablet Take 1 tablet (0.25 mg total) by mouth at bedtime as needed.  30 tablet  1  . Alum & Mag Hydroxide-Simeth (MAALOX MAXIMUM STRENGTH PO) Take by mouth.        [name] aspirin 81 MG tablet Take 81 mg by mouth daily.        [name] bismuth subsalicylate (PEPTO BISMOL) 262 MG/15ML suspension Take 15 mLs by mouth every 6 (six) hours as needed.        . calcium carbonate (TUMS EX) 750 MG chewable tablet Chew by mouth daily.        . clopidogrel (PLAVIX) 75 MG tablet Take 75 mg by mouth every other day.        [name] Dextromethorphan-Guaifenesin (ROBITUSSIN COLD COUGH+ CHEST PO) Take by mouth.        . esomeprazole (NEXIUM) 40 MG capsule Take 40 mg by mouth 2 (two) times daily.        . Fluticasone-Salmeterol (ADVAIR HFA IN) Inhale into the lungs 2 (two) times daily.       . furosemide (LASIX) 40 MG  tablet        . HYDROcodone-acetaminophen (VICODIN) 5-500 MG per tablet Take 1 tablet by mouth every 6 (six) hours as needed.        [name] losartan (COZAAR) 100 MG tablet Take 100 mg by mouth daily.       . magnesium hydroxide (MILK OF MAGNESIA) 400 MG/5ML suspension Take by mouth daily as needed.        . meclizine (ANTIVERT) 32 MG tablet Take 25 mg by mouth 3 (three) times daily as needed.        . metoprolol succinate (TOPROL-XL) 25 MG 24 hr tablet 25 mg daily.        . metroNIDAZOLE (METROCREAM) 0.75 % cream Apply topically daily.        . pravastatin (PRAVACHOL) 20 MG tablet Take 1 tablet (20 mg total) by mouth every evening.  30 tablet  11  . PRENATAL VITAMINS PO Take by mouth daily.        . Simethicone (GAS-X ULTRA STRENGTH PO) Take by mouth.        [name]  Tamsulosin HCl (FLOMAX) 0.4 MG CAPS Take 0.4 mg by mouth daily.        [name] DISCONTD: furosemide (LASIX) 40 MG tablet TAKE ONE (1) TABLET IN THE MORNING AND ONE-HALF (1/2) TABLET IN THE EVENING  135 tablet  2  . DISCONTD: metoprolol tartrate (LOPRESSOR) 25 MG tablet Take 1/2 tablet every other day   45 tablet  3    Past Medical History  Diagnosis Date  . LVF (left ventricular failure)     chronic LV systolic failure  . Ischemic heart disease   . Atrial flutter     off amiodarone because of increased LFTs  . Shortness of breath     chronic sob with known COPD and probable chronic bronchitis  . COPD (chronic obstructive pulmonary disease)   . Bronchitis, chronic   . Myocardial infarction 1994  . Gastro - esophageal reflux   . Claudication   . BPH (benign prostatic hyperplasia)   . CHF (congestive heart failure)   . Coronary artery disease   . Hypertension   . Atrial fibrillation   . Hemorrhoids   . Hyperlipidemia   . Arrhythmia   . Hx of adenomatous colonic polyps   . Hx of colonoscopy     Past Surgical History  Procedure Date  . Cardiac catheterization 2008  . Cataract extraction   . Salivary gland surgery     ROS:   Hematuria, GERD.  Otherwise as stated in the HPI and negative for all other systems.  PHYSICAL EXAM BP 124/70  Pulse 80  Resp 18  Ht 6' (1.829 m)  Wt 186 lb (84.369 kg)  BMI 25.23 kg/m2 GENERAL:  Well appearing HEENT:  Pupils equal round and reactive, fundi not visualized, oral mucosa unremarkable, dentures NECK:  No jugular venous distention, waveform within normal limits, carotid upstroke brisk and symmetric, no bruits, no thyromegaly LYMPHATICS:  No cervical, inguinal adenopathy LUNGS:  Clear to auscultation bilaterally BACK:  No CVA tenderness CHEST:  Unremarkable HEART:  PMI not displaced or sustained,S1 and S2 within normal limits, no S3, no S4, no clicks, no rubs, no murmurs ABD:  Flat, positive bowel sounds normal in frequency in pitch, no bruits, no rebound, no guarding, no midline pulsatile mass, no hepatomegaly, no splenomegaly EXT:  2 plus pulses throughout, no edema, no cyanosis no clubbing, chronic venous stasis changes. SKIN:  No rashes no nodules NEURO:  Cranial nerves II through XII grossly intact, motor grossly intact throughout PSYCH:  Cognitively intact, oriented to person place and time  ASSESSMENT AND PLAN

## 2010-11-26 NOTE — Assessment & Plan Note (Signed)
At this point I will continue to titrate the beta blocker.  He will eventually need another echo.  If the EF does not improve he will need an ICD.

## 2010-11-26 NOTE — Assessment & Plan Note (Signed)
I reviewed his hospital records from the last admit and all of the available office records, his previous echo and monitor.   (Greater than 40 minutes reviewing all data with greater than 50% face to face with the patient).  I will continue treatment as above.

## 2010-11-27 ENCOUNTER — Telehealth: Payer: Self-pay | Admitting: *Deleted

## 2010-11-27 NOTE — Telephone Encounter (Signed)
Notified of lab results. Will send copy to Dr. Hal Hope

## 2010-11-27 NOTE — Telephone Encounter (Signed)
Message copied by Lorayne Bender on Fri Nov 27, 2010  3:21 PM ------      Message from: [place], PETER M      Created: Thu Nov 26, 2010 12:45 PM       Chemistries all look good. Lipids are better. Continue Rx.

## 2010-12-04 ENCOUNTER — Other Ambulatory Visit: Payer: Self-pay | Admitting: Dermatology

## 2010-12-04 ENCOUNTER — Other Ambulatory Visit: Payer: Self-pay | Admitting: *Deleted

## 2010-12-04 ENCOUNTER — Encounter: Payer: Self-pay | Admitting: Cardiology

## 2010-12-10 ENCOUNTER — Ambulatory Visit (INDEPENDENT_AMBULATORY_CARE_PROVIDER_SITE_OTHER): Payer: Medicare Other | Admitting: Nurse Practitioner

## 2010-12-10 ENCOUNTER — Encounter: Payer: Self-pay | Admitting: Nurse Practitioner

## 2010-12-10 DIAGNOSIS — I428 Other cardiomyopathies: Secondary | ICD-10-CM

## 2010-12-10 DIAGNOSIS — I472 Ventricular tachycardia: Secondary | ICD-10-CM

## 2010-12-10 DIAGNOSIS — I255 Ischemic cardiomyopathy: Secondary | ICD-10-CM

## 2010-12-10 DIAGNOSIS — I2589 Other forms of chronic ischemic heart disease: Secondary | ICD-10-CM

## 2010-12-10 MED ORDER — METOPROLOL SUCCINATE ER 50 MG PO TB24: ORAL_TABLET | ORAL | Status: DC

## 2010-12-10 NOTE — Assessment & Plan Note (Signed)
He is on ARB and Lasix. Metoprolol is increased today. May consider aldactone in the future. Patient is agreeable to this plan and will call if any problems develop in the interim.

## 2010-12-10 NOTE — Progress Notes (Signed)
Andrew [name] Date of Birth: 1931/03/30   History of Present Illness: Andrew [name] is seen back today for a 2 week check. He is seen for Dr. Antoine Poche. He is a former patient of Dr. Deborah Chalk. He has had his Metoprolol increased to 50 mg. He noted that he felt rough for a few days after increasing the dose but now is ok. It took about 4 days to get better. I explained that that could happen. Blood pressure has been ok. He has done well but yesterday noted some palpitations and elevated heart rate. He notes that overall his palpitations are less. He and his wife understand the need to try and up titrate his medicines. Last echo in December showed EF of about 25%. No chest pain. Weights have been ok.   Current Outpatient Prescriptions on File Prior to Visit  Medication Sig Dispense Refill  . acetaminophen (TYLENOL) 500 MG tablet Take 500 mg by mouth every 6 (six) hours as needed.        [name] albuterol (2.5 MG/3ML) 0.083% NEBU 3 mL, albuterol (5 MG/ML) 0.5% NEBU 0.5 mL Inhale into the lungs. 2-3 times daily       . Albuterol Sulfate (PROAIR HFA IN) Inhale into the lungs as needed. One tablet every other day      . Alpha-D-Galactosidase (BEANO PO) Take by mouth.        . ALPRAZolam (XANAX) 0.25 MG tablet Take 1 tablet (0.25 mg total) by mouth at bedtime as needed.  30 tablet  1  . Alum & Mag Hydroxide-Simeth (MAALOX MAXIMUM STRENGTH PO) Take by mouth.        [name] aspirin 81 MG tablet Take 81 mg by mouth daily.        [name] bismuth subsalicylate (PEPTO BISMOL) 262 MG/15ML suspension Take 15 mLs by mouth every 6 (six) hours as needed.        . calcium carbonate (TUMS EX) 750 MG chewable tablet Chew by mouth daily.        . clopidogrel (PLAVIX) 75 MG tablet Take 75 mg by mouth every other day.        [name] Dextromethorphan-Guaifenesin (ROBITUSSIN COLD COUGH+ CHEST PO) Take by mouth.        . esomeprazole (NEXIUM) 40 MG capsule Take 40 mg by mouth 2 (two) times daily.        . Fluticasone-Salmeterol (ADVAIR HFA IN) Inhale  into the lungs 2 (two) times daily.       . furosemide (LASIX) 40 MG tablet As directed      . HYDROcodone-acetaminophen (VICODIN) 5-500 MG per tablet Take 1 tablet by mouth every 6 (six) hours as needed.        [name] losartan (COZAAR) 100 MG tablet Take 100 mg by mouth daily.       . magnesium hydroxide (MILK OF MAGNESIA) 400 MG/5ML suspension Take by mouth daily as needed.        . meclizine (ANTIVERT) 32 MG tablet Take 25 mg by mouth 3 (three) times daily as needed.        . metoprolol (TOPROL-XL) 50 MG 24 hr tablet Take 1 tablet (50 mg total) by mouth daily.  90 tablet  3  . pravastatin (PRAVACHOL) 20 MG tablet Take 1 tablet (20 mg total) by mouth every evening.  30 tablet  11  . PRENATAL VITAMINS PO Take by mouth daily.        . Simethicone (GAS-X ULTRA STRENGTH PO) Take by mouth.        [name]  Tamsulosin HCl (FLOMAX) 0.4 MG CAPS Take 0.4 mg by mouth 2 (two) times daily.       [name] DISCONTD: metoprolol tartrate (LOPRESSOR) 25 MG tablet Take 1/2 tablet every other day   45 tablet  3    Allergies  Allergen Reactions  . Codeine   . Sulfonamide Derivatives     Past Medical History  Diagnosis Date  . LVF (left ventricular failure)     chronic LV systolic failure  . Ischemic heart disease   . Atrial flutter     off amiodarone because of increased LFTs  . Shortness of breath     chronic sob with known COPD and probable chronic bronchitis  . COPD (chronic obstructive pulmonary disease)   . Bronchitis, chronic   . Myocardial infarction 1994  . Gastro - esophageal reflux   . Claudication   . BPH (benign prostatic hyperplasia)   . CHF (congestive heart failure)   . Coronary artery disease     status post myocardial infarction in 1984,  99 and 07.  He has multiple RCA stents  . Hypertension   . Atrial fibrillation   . Hemorrhoids   . Hyperlipidemia   . Arrhythmia   . Hx of adenomatous colonic polyps   . Hx of colonoscopy     Past Surgical History  Procedure Date  . Cardiac  catheterization 2008  . Cataract extraction   . Salivary gland surgery     History  Smoking status  . Former Smoker -- 1.5 packs/day for 55 years  . Quit date: 01/03/2009  Smokeless tobacco  . Never Used    History  Alcohol Use  . Yes    occasional    Family History  Problem Relation Age of Onset  . Coronary artery disease Father   . Heart disease Mother   . Colon cancer Neg Hx   . Heart disease Brother     Review of Systems: The review of systems is as above.  All other systems were reviewed and are negative.  Physical Exam: BP 120/78  Pulse 80  Wt 187 lb (84.823 kg) Patient is very pleasant and in no acute distress. Skin is warm and dry. Color is normal. He is very ecchymotic which is chronic.  HEENT is unremarkable. Normocephalic/atraumatic. PERRL. Sclera are nonicteric. Neck is supple. No masses. No JVD. Lungs are coarse.  Cardiac exam shows a regular rate and rhythm. Few ectopics noted.  Abdomen is soft. Extremities are without edema. Gait and ROM are intact. No gross neurologic deficits noted.  LABORATORY DATA:   Assessment / Plan:

## 2010-12-10 NOTE — Patient Instructions (Signed)
I want you to try to increase your metoprolol to 50 mg in the am and 25 mg in the evenings We will see you back in 3 weeks. Call for any problems.

## 2010-12-10 NOTE — Assessment & Plan Note (Signed)
We will try him on 50 mg of Metoprolol in the am and add 25 mg in the evening. I cautioned him that it may take a few days to adjust and to let us know if he is not able to tolerate. I will see him back in about 3 weeks. He will need a follow up echo.

## 2010-12-11 ENCOUNTER — Ambulatory Visit: Payer: Medicare Other | Admitting: Nurse Practitioner

## 2010-12-14 ENCOUNTER — Telehealth: Payer: Self-pay | Admitting: Cardiology

## 2010-12-14 NOTE — Telephone Encounter (Signed)
Reference #: [id number] Reguarding a Metoprolol 50mg  and 25mg  half a tablet subscription. Had a question about dosing. Please call back.

## 2010-12-16 NOTE — Telephone Encounter (Signed)
[facility] call back regarding metroprolol 50 mg. Please return call

## 2010-12-16 NOTE — Telephone Encounter (Signed)
Spoke with [facility] about Rx for Metoprolol.  According to note by Norma Fredrickson ,NP pt is to be taking 50 mg in the am and 1/2 tablet in the pm.  [facility] will fill the 50 mg tablets #135 with 3 refills.

## 2010-12-29 ENCOUNTER — Ambulatory Visit (INDEPENDENT_AMBULATORY_CARE_PROVIDER_SITE_OTHER): Payer: Medicare Other | Admitting: Nurse Practitioner

## 2010-12-29 ENCOUNTER — Encounter: Payer: Self-pay | Admitting: Nurse Practitioner

## 2010-12-29 VITALS — BP 138/78 | HR 80 | Ht 72.0 in | Wt 187.4 lb

## 2010-12-29 DIAGNOSIS — I255 Ischemic cardiomyopathy: Secondary | ICD-10-CM

## 2010-12-29 DIAGNOSIS — R0609 Other forms of dyspnea: Secondary | ICD-10-CM

## 2010-12-29 DIAGNOSIS — I472 Ventricular tachycardia: Secondary | ICD-10-CM

## 2010-12-29 DIAGNOSIS — R0602 Shortness of breath: Secondary | ICD-10-CM

## 2010-12-29 DIAGNOSIS — I2589 Other forms of chronic ischemic heart disease: Secondary | ICD-10-CM

## 2010-12-29 DIAGNOSIS — IMO0001 Reserved for inherently not codable concepts without codable children: Secondary | ICD-10-CM

## 2010-12-29 DIAGNOSIS — R0989 Other specified symptoms and signs involving the circulatory and respiratory systems: Secondary | ICD-10-CM

## 2010-12-29 MED ORDER — SPIRONOLACTONE 25 MG PO TABS: ORAL_TABLET | ORAL | Status: DC

## 2010-12-29 MED ORDER — NITROGLYCERIN 0.4 MG SL SUBL: 0.4000 mg | SUBLINGUAL_TABLET | SUBLINGUAL | Status: DC | PRN

## 2010-12-29 NOTE — Assessment & Plan Note (Signed)
His palpitations are better but is having more shortness of breath, most likely from the increase in beta blocker.

## 2010-12-29 NOTE — Assessment & Plan Note (Signed)
He has known COPD and CHF. He is using his oxygen. He is more short of breath, especially with recent increase in his beta blocker. He is willing to give it a few more days. We may have to cut him back to 50 mg. His wife will call in the next few days and give [place] an update.

## 2010-12-29 NOTE — Progress Notes (Signed)
Andrew [name] Date of Birth: 07/26/1930   History of Present Illness: Andrew [name] is seen back today for a 3 week check. He is here with his wife. He is seen for Dr. Antoine Poche. He is not doing well. We have titrated up his Metoprolol to a total of 75 mg. He has had some worsening shortness of breath. He had this when he was increased to the 50 mg but got better after a few days. He has had to use some extra Lasix for weights over 183. No chest pain. Palpitations are better. He is not dizzy. He is frustrated with his activity intolerance. He has used both his oxygen and his NTG for dyspnea. His weight and blood pressure chart from home are reviewed. Last potassium check was normal and he had normal renal function.   Current Outpatient Prescriptions on File Prior to Visit  Medication Sig Dispense Refill  . acetaminophen (TYLENOL) 500 MG tablet Take 500 mg by mouth every 6 (six) hours as needed.        [name] albuterol (2.5 MG/3ML) 0.083% NEBU 3 mL, albuterol (5 MG/ML) 0.5% NEBU 0.5 mL Inhale into the lungs. 2-3 times daily       . Albuterol Sulfate (PROAIR HFA IN) Inhale into the lungs as needed. One tablet every other day      . Alpha-D-Galactosidase (BEANO PO) Take by mouth.        . ALPRAZolam (XANAX) 0.25 MG tablet Take 1 tablet (0.25 mg total) by mouth at bedtime as needed.  30 tablet  1  . Alum & Mag Hydroxide-Simeth (MAALOX MAXIMUM STRENGTH PO) Take by mouth.        [name] aspirin 81 MG tablet Take 81 mg by mouth daily.        [name] bismuth subsalicylate (PEPTO BISMOL) 262 MG/15ML suspension Take 15 mLs by mouth every 6 (six) hours as needed.        . calcium carbonate (TUMS EX) 750 MG chewable tablet Chew by mouth daily.        . clopidogrel (PLAVIX) 75 MG tablet Take 75 mg by mouth every other day.        [name] Dextromethorphan-Guaifenesin (ROBITUSSIN COLD COUGH+ CHEST PO) Take by mouth.        . esomeprazole (NEXIUM) 40 MG capsule Take 40 mg by mouth 2 (two) times daily.        . Fluticasone-Salmeterol  (ADVAIR HFA IN) Inhale into the lungs 2 (two) times daily.       . furosemide (LASIX) 40 MG tablet As directed      . HYDROcodone-acetaminophen (VICODIN) 5-500 MG per tablet Take 1 tablet by mouth every 6 (six) hours as needed.        [name] losartan (COZAAR) 100 MG tablet Take 100 mg by mouth daily.       . magnesium hydroxide (MILK OF MAGNESIA) 400 MG/5ML suspension Take by mouth daily as needed.        . meclizine (ANTIVERT) 32 MG tablet Take 25 mg by mouth 3 (three) times daily as needed.        . metoprolol (TOPROL-XL) 50 MG 24 hr tablet Increase to 50 mg in the am and 25 mg in the evening  90 tablet  3  . pravastatin (PRAVACHOL) 20 MG tablet Take 1 tablet (20 mg total) by mouth every evening.  30 tablet  11  . PRENATAL VITAMINS PO Take by mouth daily.        . Simethicone (  GAS-X ULTRA STRENGTH PO) Take by mouth.        . Tamsulosin HCl (FLOMAX) 0.4 MG CAPS Take 0.4 mg by mouth 2 (two) times daily.       [name] DISCONTD: metoprolol tartrate (LOPRESSOR) 25 MG tablet Take 1/2 tablet every other day   45 tablet  3    Allergies  Allergen Reactions  . Codeine   . Sulfonamide Derivatives     Past Medical History  Diagnosis Date  . LVF (left ventricular failure)     chronic LV systolic failure; last echo in December of 2011 showed EF of 25%.   . Ischemic heart disease   . Atrial flutter     off amiodarone because of increased LFTs  . Shortness of breath     chronic sob with known COPD and probable chronic bronchitis  . COPD (chronic obstructive pulmonary disease)   . Bronchitis, chronic   . Myocardial infarction 1994  . Gastro - esophageal reflux   . Claudication   . BPH (benign prostatic hyperplasia)   . CHF (congestive heart failure)   . Coronary artery disease     status post myocardial infarction in 1984,  99 and 07.  He has multiple RCA stents  . Hypertension   . Atrial fibrillation   . Hemorrhoids   . Hyperlipidemia   . Arrhythmia   . Hx of adenomatous colonic polyps   . Hx of  colonoscopy     Past Surgical History  Procedure Date  . Cardiac catheterization 2008  . Cataract extraction   . Salivary gland surgery     History  Smoking status  . Former Smoker -- 1.5 packs/day for 55 years  . Quit date: 01/03/2009  Smokeless tobacco  . Never Used    History  Alcohol Use  . Yes    occasional    Family History  Problem Relation Age of Onset  . Coronary artery disease Father   . Heart disease Mother   . Colon cancer Neg Hx   . Heart disease Brother     Review of Systems: The review of systems is positive for worsening shortness of breath.  All other systems were reviewed and are negative.  Physical Exam: BP 138/78  Pulse 80  Ht 6' (1.829 m)  Wt 187 lb 6.4 oz (85.004 kg)  BMI 25.42 kg/m2 Patient is very pleasant and in no acute distress. He does appear chronically ill. Skin is warm and dry. Color is normal. He is very ecchymotic.  HEENT is unremarkable. Normocephalic/atraumatic. PERRL. Sclera are nonicteric. Neck is supple. No masses. No JVD. Lungs show decreased breath sounds. Cardiac exam shows a regular rate and rhythm. Very little ectopics noted today. Abdomen is soft. Extremities are without edema. Gait and ROM are intact. No gross neurologic deficits noted.   LABORATORY DATA: Labs are pending.   Assessment / Plan:

## 2010-12-29 NOTE — Patient Instructions (Addendum)
We are going to add some Aldactone 25 mg daily. Take only a half of a tablet. I have sent the prescription to [facility] I have refilled your NTG We are going to check your labs today We will see you back on October 8th with Dr. Antoine Poche

## 2010-12-29 NOTE — Assessment & Plan Note (Signed)
He is on ARB and Lasix. He remains on beta blocker but is probably not going to tolerate this current dose. Will probably have to cut back but he is willing to try it a little longer. BMET, BNP and CBC are checked today. I have added some low dose aldactone. I will see him back in about 10 days or so along with Dr. Antoine Poche. He will need repeat BMET on return. Patient is agreeable to this plan and will call if any problems develop in the interim.

## 2010-12-30 LAB — CBC WITH DIFFERENTIAL/PLATELET
Basophils Absolute: 0 10*3/uL (ref 0.0–0.1)
Basophils Relative: 0.5 % (ref 0.0–3.0)
Eosinophils Absolute: 0.5 10*3/uL (ref 0.0–0.7)
Eosinophils Relative: 6.6 % — ABNORMAL HIGH (ref 0.0–5.0)
HCT: 34.3 % — ABNORMAL LOW (ref 39.0–52.0)
Hemoglobin: 11.3 g/dL — ABNORMAL LOW (ref 13.0–17.0)
Lymphocytes Relative: 11.1 % — ABNORMAL LOW (ref 12.0–46.0)
Lymphs Abs: 0.8 10*3/uL (ref 0.7–4.0)
MCHC: 32.8 g/dL (ref 30.0–36.0)
MCV: 91.4 fl (ref 78.0–100.0)
Monocytes Absolute: 0.7 10*3/uL (ref 0.1–1.0)
Monocytes Relative: 10 % (ref 3.0–12.0)
Neutro Abs: 5 10*3/uL (ref 1.4–7.7)
Neutrophils Relative %: 71.8 % (ref 43.0–77.0)
Platelets: 262 10*3/uL (ref 150.0–400.0)
RBC: 3.76 Mil/uL — ABNORMAL LOW (ref 4.22–5.81)
RDW: 16.4 % — ABNORMAL HIGH (ref 11.5–14.6)
WBC: 6.9 10*3/uL (ref 4.5–10.5)

## 2010-12-30 LAB — BASIC METABOLIC PANEL
BUN: 18 mg/dL (ref 6–23)
CO2: 30 mEq/L (ref 19–32)
Calcium: 8.8 mg/dL (ref 8.4–10.5)
Chloride: 99 mEq/L (ref 96–112)
Creatinine, Ser: 1 mg/dL (ref 0.4–1.5)
GFR: 80.4 mL/min (ref >60.00)
Glucose, Bld: 107 mg/dL — ABNORMAL HIGH (ref 70–99)
Potassium: 4.5 mEq/L (ref 3.5–5.1)
Sodium: 137 mEq/L (ref 135–145)

## 2010-12-30 LAB — BRAIN NATRIURETIC PEPTIDE: Pro B Natriuretic peptide (BNP): 655 pg/mL — ABNORMAL HIGH (ref 0.0–100.0)

## 2011-01-06 ENCOUNTER — Telehealth: Payer: Self-pay | Admitting: Nurse Practitioner

## 2011-01-06 NOTE — Telephone Encounter (Signed)
Called to let us know he is taking Toprol 50 mg daily. Has stopped the 25 mg at HS. He is feeling good.

## 2011-01-06 NOTE — Telephone Encounter (Signed)
Pt called and stated that he took to last pill of Toprol on Sept 29 and feels much better.  This is information only.  No need to call back.

## 2011-01-11 ENCOUNTER — Other Ambulatory Visit: Payer: Self-pay | Admitting: Cardiology

## 2011-01-15 ENCOUNTER — Encounter: Payer: Self-pay | Admitting: Cardiology

## 2011-01-15 ENCOUNTER — Ambulatory Visit (INDEPENDENT_AMBULATORY_CARE_PROVIDER_SITE_OTHER): Payer: Medicare Other | Admitting: Cardiology

## 2011-01-15 ENCOUNTER — Other Ambulatory Visit: Payer: Self-pay | Admitting: *Deleted

## 2011-01-15 DIAGNOSIS — I501 Left ventricular failure: Secondary | ICD-10-CM

## 2011-01-15 DIAGNOSIS — I2510 Atherosclerotic heart disease of native coronary artery without angina pectoris: Secondary | ICD-10-CM

## 2011-01-15 DIAGNOSIS — I10 Essential (primary) hypertension: Secondary | ICD-10-CM

## 2011-01-15 DIAGNOSIS — I472 Ventricular tachycardia: Secondary | ICD-10-CM

## 2011-01-15 LAB — DIFFERENTIAL
Basophils Absolute: 0
Basophils Relative: 0
Eosinophils Absolute: 0
Monocytes Relative: 7
Neutrophils Relative %: 85 — ABNORMAL HIGH

## 2011-01-15 LAB — CBC
HCT: 44.7
HCT: 47.3
Hemoglobin: 16.1
MCHC: 34.2
MCV: 95.4
RBC: 4.68
RBC: 4.89
WBC: 9.4

## 2011-01-15 LAB — COMPREHENSIVE METABOLIC PANEL
ALT: 20
Alkaline Phosphatase: 69
BUN: 11
CO2: 27
Chloride: 97
GFR calc non Af Amer: >60
Glucose, Bld: 117 — ABNORMAL HIGH
Potassium: 3.8
Sodium: 132 — ABNORMAL LOW
Total Bilirubin: 0.5
Total Protein: 6.7

## 2011-01-15 LAB — CARDIAC PANEL(CRET KIN+CKTOT+MB+TROPI)
Relative Index: INVALID
Total CK: 45
Total CK: 56

## 2011-01-15 LAB — PROTIME-INR: INR: 1

## 2011-01-15 MED ORDER — SPIRONOLACTONE 25 MG PO TABS: ORAL_TABLET | ORAL | Status: DC

## 2011-01-15 NOTE — Progress Notes (Signed)
HPI The patient presents for followup of his known coronary disease and cardiomyopathy.  I tried to increase his beta blocker but he did not tolerate this.  He saw [name] back and was started on spironolactone.  His beta blocker dose has been reduced.  Follow up labs (BMET ) were ok.  He now says that he feels very well. He has more energy. He is not having any new shortness of breath, PND or orthopnea. He is not having any new palpitations, presyncope or syncope. He is having no chest pressure, neck or arm discomfort. He thinks the Aldactone helped.  Allergies  Allergen Reactions  . Codeine   . Sulfonamide Derivatives     Current Outpatient Prescriptions  Medication Sig Dispense Refill  . acetaminophen (TYLENOL) 500 MG tablet Take 500 mg by mouth every 6 (six) hours as needed.        [name] albuterol (2.5 MG/3ML) 0.083% NEBU 3 mL, albuterol (5 MG/ML) 0.5% NEBU 0.5 mL Inhale into the lungs. 2-3 times daily       . Albuterol Sulfate (PROAIR HFA IN) Inhale into the lungs as needed. One tablet every other day      . Alpha-D-Galactosidase (BEANO PO) Take by mouth.        . ALPRAZolam (XANAX) 0.25 MG tablet Take 1 tablet (0.25 mg total) by mouth at bedtime as needed.  30 tablet  1  . Alum & Mag Hydroxide-Simeth (MAALOX MAXIMUM STRENGTH PO) Take by mouth.        [name] aspirin 81 MG tablet Take 81 mg by mouth daily.        [name] bismuth subsalicylate (PEPTO BISMOL) 262 MG/15ML suspension Take 15 mLs by mouth every 6 (six) hours as needed.        . calcium carbonate (TUMS EX) 750 MG chewable tablet Chew by mouth daily.        . clopidogrel (PLAVIX) 75 MG tablet Take 75 mg by mouth every other day.        [name] Dextromethorphan-Guaifenesin (ROBITUSSIN COLD COUGH+ CHEST PO) Take by mouth.        . Fluticasone-Salmeterol (ADVAIR HFA IN) Inhale into the lungs 2 (two) times daily.       . furosemide (LASIX) 40 MG tablet As directed      . HYDROcodone-acetaminophen (VICODIN) 5-500 MG per tablet Take 1 tablet by mouth every 6  (six) hours as needed.        [name] losartan (COZAAR) 100 MG tablet Take 100 mg by mouth daily.       . magnesium hydroxide (MILK OF MAGNESIA) 400 MG/5ML suspension Take by mouth daily as needed.        . meclizine (ANTIVERT) 32 MG tablet Take 25 mg by mouth 3 (three) times daily as needed.        . metoprolol (TOPROL-XL) 50 MG 24 hr tablet Take 50 mg by mouth daily.        . Multiple Vitamins-Minerals (MULTI COMPLETE PO) Take by mouth.        [name] NEXIUM 40 MG capsule TAKE 1 CAPSULE TWICE A DAY  180 capsule  2  . nitroGLYCERIN (NITROSTAT) 0.4 MG SL tablet Place 1 tablet (0.4 mg total) under the tongue every 5 (five) minutes as needed for chest pain.  25 tablet  6  . pravastatin (PRAVACHOL) 20 MG tablet Take 1 tablet (20 mg total) by mouth every evening.  30 tablet  11  . Simethicone (GAS-X ULTRA STRENGTH PO) Take by mouth.        [name]  Tamsulosin HCl (FLOMAX) 0.4 MG CAPS Take 0.4 mg by mouth 2 (two) times daily.       [name] spironolactone (ALDACTONE) 25 MG tablet Take half a tablet each day  30 tablet  3  . DISCONTD: metoprolol tartrate (LOPRESSOR) 25 MG tablet Take 1/2 tablet every other day   45 tablet  3    Past Medical History  Diagnosis Date  . LVF (left ventricular failure)     chronic LV systolic failure; last echo in December of 2011 showed EF of 25%.   . Ischemic heart disease   . Atrial flutter     off amiodarone because of increased LFTs  . Shortness of breath     chronic sob with known COPD and probable chronic bronchitis  . COPD (chronic obstructive pulmonary disease)   . Bronchitis, chronic   . Myocardial infarction 1994  . Gastro - esophageal reflux   . Claudication   . BPH (benign prostatic hyperplasia)   . CHF (congestive heart failure)   . Coronary artery disease     status post myocardial infarction in 1984,  99 and 07.  He has multiple RCA stents  . Hypertension   . Atrial fibrillation   . Hemorrhoids   . Hyperlipidemia   . Arrhythmia   . Hx of adenomatous colonic polyps    . Hx of colonoscopy     Past Surgical History  Procedure Date  . Cardiac catheterization 2008  . Cataract extraction   . Salivary gland surgery     ROS:  As stated in the HPI and negative for all other systems.  PHYSICAL EXAM BP 103/58  Pulse 78  Resp 18  Ht 5\' 11"  (1.803 m)  Wt 185 lb (83.915 kg)  BMI 25.80 kg/m2 GENERAL:  Well appearing HEENT:  Pupils equal round and reactive, fundi not visualized, oral mucosa unremarkable, dentures NECK:  No jugular venous distention, waveform within normal limits, carotid upstroke brisk and symmetric, no bruits, no thyromegaly LYMPHATICS:  No cervical, inguinal adenopathy LUNGS:  Clear to auscultation bilaterally BACK:  No CVA tenderness CHEST:  Unremarkable HEART:  PMI not displaced or sustained,S1 and S2 within normal limits, no S3, no S4, no clicks, no rubs, no murmurs ABD:  Flat, positive bowel sounds normal in frequency in pitch, no bruits, no rebound, no guarding, no midline pulsatile mass, no hepatomegaly, no splenomegaly EXT:  2 plus pulses throughout, no edema, no cyanosis no clubbing, chronic venous stasis changes. SKIN:  No rashes no nodules, bruising NEURO:  Cranial nerves II through XII grossly intact, motor grossly intact throughout PSYCH:  Cognitively intact, oriented to person place and time  ASSESSMENT AND PLAN

## 2011-01-15 NOTE — Assessment & Plan Note (Signed)
The patient has no new sypmtoms.  No further cardiovascular testing is indicated.  We will continue with aggressive risk reduction and meds as listed.  

## 2011-01-15 NOTE — Assessment & Plan Note (Signed)
I will not be able to titrate his meds.  He will get an echo in one month along with a BMET.  If this EF is still low I will refer him for an ICD.  He and I have discussed this at length.

## 2011-01-15 NOTE — Patient Instructions (Signed)
Your physician has requested that you have an echocardiogram. Echocardiography is a painless test that uses sound waves to create images of your heart. It provides your doctor with information about the size and shape of your heart and how well your heart's chambers and valves are working. This procedure takes approximately one hour. There are no restrictions for this procedure.  Please have blood work in one month.  The current medical regimen is effective;  continue present plan and medications.  Follow up with Dr Antoine Poche in January 2013

## 2011-01-15 NOTE — Assessment & Plan Note (Signed)
This is treated in the context of treating his cardiomyopathy. 

## 2011-01-15 NOTE — Assessment & Plan Note (Signed)
Apparently there has been fib/flutter but not in some time.  He was not on Coumadin when seen by Dr. Deborah Chalk.  He is off amiodarone.  At this point, without recurrence, I will not start coumadin.

## 2011-02-04 ENCOUNTER — Telehealth: Payer: Self-pay | Admitting: Cardiology

## 2011-02-04 NOTE — Telephone Encounter (Signed)
Patient received a letter from his insurance company stating that his repeat 2 D Echo (scheduled for 02/11/11) coverage has been denied because it had already been done.  Pt's last 2 D Echo was completed 03/2010.  It demonstrated an EF of 25 to 30%.   The pt had been on metoprolol at the time of this 2 D Echo. The purpose of the repeat 2 D Echo is to evaluate the pt's EF off beta blockers as the pt has been unable to tolerate them.  If the pt's EF remains less than 35% he will need to be referred for the consideration of implantation of an ICD to prevent sudden cardiac death.  The pt is going to fax a copy of the letter he received from his insurance company so that we may determine the next step in getting this necessary testing covered.

## 2011-02-04 NOTE — Telephone Encounter (Signed)
Pt calling stating that insurance, [employer] denied paying for pt to have ECHO and pt wants to know what he should do to proceed. Please return pt call to discuss further.

## 2011-02-05 NOTE — Telephone Encounter (Signed)
Pt aware insurance company has given approval for 2 D Echo and he will keep his appt 11/8 as scheduled.

## 2011-02-05 NOTE — Telephone Encounter (Signed)
Received and reviewed letter from [employer].  Taken to [employer] to call insurance company to follow up.

## 2011-02-08 ENCOUNTER — Other Ambulatory Visit: Payer: Self-pay | Admitting: Nurse Practitioner

## 2011-02-11 ENCOUNTER — Ambulatory Visit (HOSPITAL_COMMUNITY): Payer: Medicare Other | Attending: Internal Medicine | Admitting: Radiology

## 2011-02-11 ENCOUNTER — Other Ambulatory Visit (INDEPENDENT_AMBULATORY_CARE_PROVIDER_SITE_OTHER): Payer: Medicare Other | Admitting: *Deleted

## 2011-02-11 DIAGNOSIS — I2510 Atherosclerotic heart disease of native coronary artery without angina pectoris: Secondary | ICD-10-CM

## 2011-02-11 DIAGNOSIS — E785 Hyperlipidemia, unspecified: Secondary | ICD-10-CM | POA: Insufficient documentation

## 2011-02-11 DIAGNOSIS — I319 Disease of pericardium, unspecified: Secondary | ICD-10-CM | POA: Insufficient documentation

## 2011-02-11 DIAGNOSIS — I079 Rheumatic tricuspid valve disease, unspecified: Secondary | ICD-10-CM | POA: Insufficient documentation

## 2011-02-11 DIAGNOSIS — I059 Rheumatic mitral valve disease, unspecified: Secondary | ICD-10-CM | POA: Insufficient documentation

## 2011-02-11 DIAGNOSIS — I501 Left ventricular failure: Secondary | ICD-10-CM

## 2011-02-11 DIAGNOSIS — I10 Essential (primary) hypertension: Secondary | ICD-10-CM

## 2011-02-11 LAB — BASIC METABOLIC PANEL
GFR: 79.42 mL/min (ref >60.00)
Glucose, Bld: 90 mg/dL (ref 70–99)
Potassium: 4 mEq/L (ref 3.5–5.1)
Sodium: 139 mEq/L (ref 135–145)

## 2011-02-15 ENCOUNTER — Other Ambulatory Visit: Payer: Self-pay | Admitting: *Deleted

## 2011-02-15 DIAGNOSIS — I5022 Chronic systolic (congestive) heart failure: Secondary | ICD-10-CM

## 2011-03-01 ENCOUNTER — Telehealth: Payer: Self-pay | Admitting: Nurse Practitioner

## 2011-03-01 ENCOUNTER — Other Ambulatory Visit: Payer: Self-pay | Admitting: Nurse Practitioner

## 2011-03-01 NOTE — Telephone Encounter (Signed)
Spoke today with Dr. Cleta Alberts. He has seen Andrew [name] for complaints of gynecomastia and left breast pain. Aldactone will be stopped. Will readdress at next office visit.

## 2011-03-05 ENCOUNTER — Telehealth: Payer: Self-pay | Admitting: Cardiology

## 2011-03-05 DIAGNOSIS — I509 Heart failure, unspecified: Secondary | ICD-10-CM

## 2011-03-05 NOTE — Telephone Encounter (Signed)
Spoke with wife.  She is aware the aldactone issue will be addressed at the next office visit.  Pt is supposed to see Dr Antoine Poche back in January and an appt was scheduled during this call.  Pt is also to be seen by EP for ICD d/t EF of 15% on most recent 2 D Echo.  An order will be sent to [facility] so that the appointment can be scheduled with the next avaiable EP MD.

## 2011-03-05 NOTE — Telephone Encounter (Signed)
New message:  Pt referred to group for defib appt and they have not heard anything and it has been about 2 weeks.  Also, [name] was going to make a medication change for Aldactone and not sure what the out come of that was either.  Please call her back and advise

## 2011-03-10 ENCOUNTER — Emergency Department (HOSPITAL_COMMUNITY): Payer: Medicare Other

## 2011-03-10 ENCOUNTER — Other Ambulatory Visit: Payer: Self-pay

## 2011-03-10 ENCOUNTER — Ambulatory Visit (INDEPENDENT_AMBULATORY_CARE_PROVIDER_SITE_OTHER): Payer: Medicare Other

## 2011-03-10 ENCOUNTER — Telehealth: Payer: Self-pay | Admitting: *Deleted

## 2011-03-10 ENCOUNTER — Encounter (HOSPITAL_COMMUNITY): Payer: Self-pay | Admitting: Emergency Medicine

## 2011-03-10 ENCOUNTER — Inpatient Hospital Stay (HOSPITAL_COMMUNITY)
Admission: EM | Admit: 2011-03-10 | Discharge: 2011-03-11 | DRG: 069 | Disposition: A | Discharge status: Home / Self Care | Payer: Medicare Other | Attending: Family Medicine | Admitting: Family Medicine

## 2011-03-10 DIAGNOSIS — J4489 Other specified chronic obstructive pulmonary disease: Secondary | ICD-10-CM | POA: Diagnosis present

## 2011-03-10 DIAGNOSIS — J449 Chronic obstructive pulmonary disease, unspecified: Secondary | ICD-10-CM | POA: Diagnosis present

## 2011-03-10 DIAGNOSIS — R279 Unspecified lack of coordination: Secondary | ICD-10-CM

## 2011-03-10 DIAGNOSIS — I252 Old myocardial infarction: Secondary | ICD-10-CM

## 2011-03-10 DIAGNOSIS — I4891 Unspecified atrial fibrillation: Secondary | ICD-10-CM | POA: Diagnosis present

## 2011-03-10 DIAGNOSIS — I639 Cerebral infarction, unspecified: Secondary | ICD-10-CM

## 2011-03-10 DIAGNOSIS — G459 Transient cerebral ischemic attack, unspecified: Principal | ICD-10-CM | POA: Diagnosis present

## 2011-03-10 DIAGNOSIS — R209 Unspecified disturbances of skin sensation: Secondary | ICD-10-CM

## 2011-03-10 LAB — DIFFERENTIAL
Lymphocytes Relative: 13 % (ref 12–46)
Monocytes Absolute: 0.6 10*3/uL (ref 0.1–1.0)
Monocytes Relative: 7 % (ref 3–12)
Neutro Abs: 6 10*3/uL (ref 1.7–7.7)

## 2011-03-10 LAB — CBC
HCT: 34.2 % — ABNORMAL LOW (ref 39.0–52.0)
Hemoglobin: 11.4 g/dL — ABNORMAL LOW (ref 13.0–17.0)
MCH: 30.6 pg (ref 26.0–34.0)
MCHC: 33.9 g/dL (ref 30.0–36.0)
Platelets: 272 10*3/uL (ref 150–400)
RBC: 3.95 MIL/uL — ABNORMAL LOW (ref 4.22–5.81)
WBC: 8 10*3/uL (ref 4.0–10.5)

## 2011-03-10 LAB — COMPREHENSIVE METABOLIC PANEL
AST: 17 U/L (ref 0–37)
BUN: 21 mg/dL (ref 6–23)
CO2: 27 mEq/L (ref 19–32)
Chloride: 103 mEq/L (ref 96–112)
Creatinine, Ser: 1.17 mg/dL (ref 0.50–1.35)
GFR calc non Af Amer: 58 mL/min — ABNORMAL LOW (ref >90)
Total Bilirubin: 0.3 mg/dL (ref 0.3–1.2)

## 2011-03-10 LAB — RAPID URINE DRUG SCREEN, HOSP PERFORMED
Amphetamines: NOT DETECTED
Barbiturates: NOT DETECTED
Opiates: NOT DETECTED
Tetrahydrocannabinol: NOT DETECTED

## 2011-03-10 LAB — CREATININE, SERUM
Creatinine, Ser: 1.07 mg/dL (ref 0.50–1.35)
GFR calc non Af Amer: 64 mL/min — ABNORMAL LOW (ref >90)

## 2011-03-10 LAB — PROTIME-INR: Prothrombin Time: 15.2 seconds (ref 11.6–15.2)

## 2011-03-10 LAB — APTT: aPTT: 31 seconds (ref 24–37)

## 2011-03-10 MED ORDER — ALBUTEROL SULFATE (2.5 MG/3ML) 0.083% IN NEBU
2.5000 mg | INHALATION_SOLUTION | Freq: Three times a day (TID) | RESPIRATORY_TRACT | Status: DC | PRN
Start: 2011-03-10 — End: 2011-03-10

## 2011-03-10 MED ORDER — HYDROCODONE-ACETAMINOPHEN 5-325 MG PO TABS
1.0000 | ORAL_TABLET | Freq: Four times a day (QID) | ORAL | Status: DC | PRN
  Administered 2011-03-11: 1 via ORAL
  Filled 2011-03-10: qty 1

## 2011-03-10 MED ORDER — HEPARIN SODIUM (PORCINE) 5000 UNIT/ML IJ SOLN
5000.0000 [IU] | Freq: Three times a day (TID) | INTRAMUSCULAR | Status: DC
  Administered 2011-03-10 – 2011-03-11 (×2): 5000 [IU] via SUBCUTANEOUS
  Filled 2011-03-10 (×5): qty 1

## 2011-03-10 MED ORDER — LOSARTAN POTASSIUM 50 MG PO TABS
100.0000 mg | ORAL_TABLET | Freq: Every day | ORAL | Status: DC
  Filled 2011-03-10: qty 2

## 2011-03-10 MED ORDER — METOPROLOL SUCCINATE ER 50 MG PO TB24
50.0000 mg | ORAL_TABLET | Freq: Every day | ORAL | Status: DC
  Filled 2011-03-10: qty 1

## 2011-03-10 MED ORDER — ALBUTEROL SULFATE (5 MG/ML) 0.5% IN NEBU: 2.5000 mg | INHALATION_SOLUTION | Freq: Three times a day (TID) | RESPIRATORY_TRACT | Status: DC | PRN

## 2011-03-10 MED ORDER — ACETAMINOPHEN 500 MG PO TABS
500.0000 mg | ORAL_TABLET | Freq: Four times a day (QID) | ORAL | Status: DC | PRN
  Filled 2011-03-10: qty 1

## 2011-03-10 MED ORDER — ASPIRIN 81 MG PO TABS: 81.0000 mg | ORAL_TABLET | Freq: Every day | ORAL | Status: DC

## 2011-03-10 MED ORDER — CLOPIDOGREL BISULFATE 75 MG PO TABS
75.0000 mg | ORAL_TABLET | Freq: Every day | ORAL | Status: DC
  Administered 2011-03-11: 75 mg via ORAL
  Filled 2011-03-10 (×2): qty 1

## 2011-03-10 MED ORDER — SODIUM CHLORIDE 0.9 % IJ SOLN
3.0000 mL | Freq: Two times a day (BID) | INTRAMUSCULAR | Status: DC
  Administered 2011-03-10: 3 mL via INTRAVENOUS

## 2011-03-10 MED ORDER — PANTOPRAZOLE SODIUM 40 MG PO TBEC
40.0000 mg | DELAYED_RELEASE_TABLET | Freq: Every day | ORAL | Status: DC
  Administered 2011-03-11: 40 mg via ORAL
  Filled 2011-03-10: qty 1

## 2011-03-10 MED ORDER — ALPRAZOLAM 0.25 MG PO TABS: 0.2500 mg | ORAL_TABLET | Freq: Every evening | ORAL | Status: DC | PRN

## 2011-03-10 MED ORDER — LOSARTAN POTASSIUM 50 MG PO TABS: 100.0000 mg | ORAL_TABLET | Freq: Every day | ORAL | Status: DC

## 2011-03-10 MED ORDER — FUROSEMIDE 40 MG PO TABS
40.0000 mg | ORAL_TABLET | Freq: Every day | ORAL | Status: DC
  Administered 2011-03-11: 40 mg via ORAL
  Filled 2011-03-10: qty 1

## 2011-03-10 MED ORDER — SODIUM CHLORIDE 0.9 % IV SOLN: 250.0000 mL | INTRAVENOUS | Status: DC | PRN

## 2011-03-10 MED ORDER — FLUTICASONE-SALMETEROL 250-50 MCG/DOSE IN AEPB
1.0000 | INHALATION_SPRAY | Freq: Two times a day (BID) | RESPIRATORY_TRACT | Status: DC
  Administered 2011-03-10 – 2011-03-11 (×3): 1 via RESPIRATORY_TRACT
  Filled 2011-03-10: qty 14

## 2011-03-10 MED ORDER — NITROGLYCERIN 0.4 MG SL SUBL: 0.4000 mg | SUBLINGUAL_TABLET | SUBLINGUAL | Status: DC | PRN

## 2011-03-10 MED ORDER — SODIUM CHLORIDE 0.9 % IJ SOLN: 3.0000 mL | INTRAMUSCULAR | Status: DC | PRN

## 2011-03-10 MED ORDER — ASPIRIN 81 MG PO CHEW
81.0000 mg | CHEWABLE_TABLET | Freq: Every day | ORAL | Status: DC
  Administered 2011-03-11: 81 mg via ORAL
  Filled 2011-03-10: qty 1

## 2011-03-10 MED ORDER — TAMSULOSIN HCL 0.4 MG PO CAPS
0.4000 mg | ORAL_CAPSULE | Freq: Two times a day (BID) | ORAL | Status: DC
  Administered 2011-03-10 – 2011-03-11 (×2): 0.4 mg via ORAL
  Filled 2011-03-10 (×3): qty 1

## 2011-03-10 MED ORDER — SODIUM CHLORIDE 0.9 % IV SOLN: INTRAVENOUS | Status: AC

## 2011-03-10 MED ORDER — SIMVASTATIN 20 MG PO TABS
20.0000 mg | ORAL_TABLET | Freq: Every day | ORAL | Status: DC
  Filled 2011-03-10: qty 1

## 2011-03-10 NOTE — Telephone Encounter (Signed)
Per TC from Dr Cleta Alberts.  He saw pt today for left face, arm and leg numbness and sent him to the ED.  Pt wanted to let Dr Antoine Poche know he was going to the hospital.  Dr Cleta Alberts did not ask for a consult.

## 2011-03-10 NOTE — ED Notes (Signed)
Attempted to call report, nurse unavailable.

## 2011-03-10 NOTE — ED Notes (Signed)
Admitting MD at bedside.

## 2011-03-10 NOTE — ED Notes (Signed)
Patient transported to X-ray 

## 2011-03-10 NOTE — H&P (Signed)
Family Medicine Teaching [facility] Admission History and Physical  Patient name: Andrew [name] Medical record number: [id number] Date of birth: [birth year] Age: 77 y.o. Gender: male  Primary Care Provider: Dois Davenport., MD  Chief Complaint: Instability  History of Present Illness: Andrew [name] is a 77 y.o. year old male presenting with one-day history of gait instability with left sided numbness. Patient reports lifting kerosene heaters yesterday evening and felt a "pop "in his left leg. Symptoms quickly resolved and patient was to bed at 10:30 that night feeling fine. Patient awoke at 2 AM to use the bathroom and was without symptoms at that time. Patient again woke at 4 AM to use the bathroom but had gait instability and staggered to the bathroom and back. Patient went back to sleep and awoke at 8 AM still feeling in stable. At breakfast patient felt a numb sensation beginning in his left foot which progressed up his left lower extremity to involve his left trunk and eventually up to the left side of his head causing left-sided hearing impairment. Patient went to PCPs office who referred patient to [facility] for stroke evaluation.  Review Of Systems: Per HPI with the following additions:  Negative: Nausea, vomiting, diarrhea, rash, fever, chest pain, shortness of breath, pain, change in vision, change in taste, change in smell, syncope, lightheadedness, dizziness, headache, diaphoresis, emesis Otherwise 12 point review of systems was performed and was unremarkable.  Patient Active Problem List  Diagnoses  . UNSPECIFIED ANEMIA  . COPD UNSPECIFIED  . GERD  . DYSPNEA  . Nonspecific (abnormal) findings on radiological and other examination of body structure  . LIVER FUNCTION TESTS, ABNORMAL, HX OF  . PERSONAL HISTORY OF COLONIC POLYPS  . ABNORMAL LUNG XRAY  . Ischemic heart disease  . Atrial flutter  . LVF (left ventricular failure)  . Hypertension  . Ischemic cardiomyopathy   . V-tach  . Hyperlipidemia  . CAD (coronary artery disease)    Past Medical History: Past Medical History  Diagnosis Date  . LVF (left ventricular failure)     chronic LV systolic failure; last echo in December of 2011 showed EF of 25%.   . Ischemic heart disease   . Atrial flutter     off amiodarone because of increased LFTs  . Shortness of breath     chronic sob with known COPD and probable chronic bronchitis  . COPD (chronic obstructive pulmonary disease)   . Bronchitis, chronic   . Myocardial infarction 1994  . Gastro - esophageal reflux   . Claudication   . BPH (benign prostatic hyperplasia)   . CHF (congestive heart failure)   . Coronary artery disease     status post myocardial infarction in 1984,  99 and 07.  He has multiple RCA stents  . Hypertension   . Atrial fibrillation   . Hemorrhoids   . Hyperlipidemia   . Arrhythmia   . Hx of adenomatous colonic polyps   . Hx of colonoscopy     Past Surgical History: Past Surgical History  Procedure Date  . Cardiac catheterization 2008  . Cataract extraction   . Salivary gland surgery     Social History: History   Social History  . Marital Status: Married    Spouse Name: N/A    Number of Children: N/A  . Years of Education: N/A   Occupational History  . retired Designer, industrial/product work    Social History Main Topics  . Smoking status: Former Smoker -- 1.5 packs/day  for 55 years    Quit date: 01/03/2009  . Smokeless tobacco: Never Used  . Alcohol Use: Yes     occasional  . Drug Use: No  . Sexually Active: None   Other Topics Concern  . None   Social History Narrative  . None    Family History: Family History  Problem Relation Age of Onset  . Coronary artery disease Father   . Heart disease Mother   . Colon cancer Neg Hx   . Heart disease Brother     Allergies: Allergies  Allergen Reactions  . Codeine Nausea And Vomiting  . Sulfonamide Derivatives Itching and Rash    No current  facility-administered medications for this encounter.   Current Outpatient Prescriptions  Medication Sig Dispense Refill  . acetaminophen (TYLENOL) 500 MG tablet Take 500 mg by mouth every 6 (six) hours as needed. As needed for pain.      [name] albuterol (2.5 MG/3ML) 0.083% NEBU 3 mL, albuterol (5 MG/ML) 0.5% NEBU 0.5 mL Inhale 2.5 mg into the lungs every 8 (eight) hours as needed. As needed for shortness of breath.      . ALPRAZolam (XANAX) 0.25 MG tablet Take 0.25 mg by mouth at bedtime as needed. As needed for anxiety.       . Alum & Mag Hydroxide-Simeth (MAALOX MAXIMUM STRENGTH PO) Take by mouth.        [name] aspirin 81 MG tablet Take 81 mg by mouth daily.        [name] bismuth subsalicylate (PEPTO BISMOL) 262 MG/15ML suspension Take 15 mLs by mouth every 6 (six) hours as needed.        . calcium carbonate (TUMS EX) 750 MG chewable tablet Chew by mouth daily.        . clopidogrel (PLAVIX) 75 MG tablet TAKE 1 TABLET EVERY OTHER DAY  45 tablet  4  . esomeprazole (NEXIUM) 40 MG capsule Take 40 mg by mouth 2 (two) times daily.       . Fluticasone-Salmeterol (ADVAIR) 250-50 MCG/DOSE AEPB Inhale 1 puff into the lungs every 12 (twelve) hours.        . furosemide (LASIX) 40 MG tablet Take 40 mg by mouth daily. As directed      . HYDROcodone-acetaminophen (VICODIN) 5-500 MG per tablet Take 1 tablet by mouth every 6 (six) hours as needed.        [name] losartan (COZAAR) 100 MG tablet Take 100 mg by mouth daily.       . magnesium hydroxide (MILK OF MAGNESIA) 400 MG/5ML suspension Take by mouth daily as needed.        . meclizine (ANTIVERT) 25 MG tablet Take 25 mg by mouth 3 (three) times daily as needed. As needed for dizziness.       . metoprolol (TOPROL-XL) 50 MG 24 hr tablet Take 50 mg by mouth daily.        . Multiple Vitamins-Minerals (MULTI COMPLETE PO) Take 1 tablet by mouth every evening.       . pravastatin (PRAVACHOL) 20 MG tablet Take 1 tablet (20 mg total) by mouth every evening.  30 tablet  11  .  Simethicone (GAS-X ULTRA STRENGTH PO) Take by mouth.        . Tamsulosin HCl (FLOMAX) 0.4 MG CAPS Take 0.4 mg by mouth 2 (two) times daily.       [name] DISCONTD: ALPRAZolam (XANAX) 0.25 MG tablet Take 1 tablet (0.25 mg total) by mouth at bedtime as needed.  30  tablet  1  . DISCONTD: NEXIUM 40 MG capsule TAKE 1 CAPSULE TWICE A DAY  180 capsule  2  . Albuterol Sulfate (PROAIR HFA IN) Inhale 2 puffs into the lungs as needed. As needed for shortness of breath.      . Alpha-D-Galactosidase (BEANO PO) Take by mouth.        . Dextromethorphan-Guaifenesin (ROBITUSSIN COLD COUGH+ CHEST PO) Take by mouth.        . nitroGLYCERIN (NITROSTAT) 0.4 MG SL tablet Place 1 tablet (0.4 mg total) under the tongue every 5 (five) minutes as needed for chest pain.  25 tablet  6  . DISCONTD: metoprolol tartrate (LOPRESSOR) 25 MG tablet Take 1/2 tablet every other day   45 tablet  3     Physical Exam:            BP 164/82  Pulse 70  Temp(Src) 97.5 F (36.4 C) (Oral)  Resp 16  SpO2 98% General: alert, cooperative and appears stated age HEENT: PERRLA, extra ocular movement intact, sclera clear, anicteric, oropharynx clear, no lesions and neck supple with midline trachea Heart: Faint heart sounds. S1, S2 normal, no murmur, rub or gallop, regular rate and rhythm Lungs: clear to auscultation, no wheezes or rales and unlabored breathing Abdomen: Normal active bowel sounds, abdomen is soft without significant tenderness, masses, organomegaly or guarding Extremities: extremities normal, atraumatic, no cyanosis or edema and 2+ pulses throughout Musculoskeletal: no joint tenderness, deformity or swelling Skin:no rashes, no petechiae, no nodules, no jaundice, no wounds, significant ecchymoses on both hands Neurology: PERRLA, lightly diminished sensation over left-sided V2 V3 distributions, decreased hearing on the left, strength equal bilaterally in upper and lower Jevity 6, gross facial movement intact, speech clear, some gait  ataxia, some instability during Romberg, but able to maintain upright position. normal finger to nose and heel-to-shin.  Labs and Imaging: Lab Results  Component Value Date/Time   NA 139 03/10/2011  2:26 PM   K 4.0 03/10/2011  2:26 PM   CL 103 03/10/2011  2:26 PM   CO2 27 03/10/2011  2:26 PM   BUN 21 03/10/2011  2:26 PM   CREATININE 1.17 03/10/2011  2:26 PM   CREATININE 1.41 06/23/2010  5:44 PM   GLUCOSE 88 03/10/2011  2:26 PM   Lab Results  Component Value Date   WBC 8.0 03/10/2011   HGB 11.4* 03/10/2011   HCT 34.2* 03/10/2011   MCV 91.0 03/10/2011   PLT 261 03/10/2011   CT:  1. No acute intracranial abnormality or significant interval  change.  2. Stable remote infarcts involving the right PICA territory, the  left thalamus, and left posterior parietal watershed territory.  3. Stable atrophy and mild white matter disease. This likely  reflects the sequelae of chronic microvascular ischemia.  MRI: pending   Assessment and Plan: LANDYNN DUPLER is a 77 y.o. year old male presenting with 1 day history of stroke-like symptoms.   1. Stroke: Stroke vs TIA. Symptoms worrisome of ischemic neurological activity (embolic vs hemorrhagic).Past h/o CNS infarcts. Symptoms do not seem to be worsening. Stroke protocol initiated. MRI pending. On anticoagulation for cardiac disease which makes pt higher intracranial hemorrhage risk. Will obtain Hgb A1c, and lipid panel for risk stratification. Will consider carotid doppler and cardiac echo for possible embolic source, though normal cardiac exam noted.   2.   CV: Significant past medical history of cardiac vascular disease. Normal cardiac exam at this time. Will continue w/ current anticoagulation medications unless pt experiencing  hemorrhagic stroke. Hemodynamically stable. Will continue with home medication of Plavix, statin, and ASA for coronary artery disease and metoprolol, losartan and lasix for HTN.   3. FEN/GI: NPO until passes swallow study, then  regular diet. SLIV unless unable to take PO. Will conintue w/ Miralax for constipation PRN.   4.   Respiratory: O2 sats normal on RA. Pt reports needing supplemental O2 at night. Will provide O2 to keep sats above 90%. Will continue home meds of albuterol PRN and advair BID.   5. Prophylaxis: will continue home Plavix, and Nexium  6. Disposition: Pending clinical improvement and further workup.    Signed: Shelly Flatten, MD Family Medicine Resident PGY-1 03/10/2011 5:46 PM   Upper Level Addendum: Saw patient with Dr Konrad Dolores. I agree with his assessment and plan. I feel that CVA is most likely cause of his symptoms and instability. Additionally Lesion of his C-spine is possible. However he denies any pain which lessens this likelihood. I agree with MRI as it would confirm our tentative diagnosis of CVA. If negative I would pursue alternative diagnosis.   Will follow up with team in the morning.  COREY,EVAN 7:39 PM

## 2011-03-10 NOTE — ED Notes (Signed)
Pt states that he woke at 0430 this morning and had difficulty ambulating, went back to bed and then when he got up again at 0800 he had some left arm and leg tingling, now states that the tingling is gone and is now a little numb.  States that he also cant hear out of his left ear.  Seen at [facility] and sent in for eval.

## 2011-03-10 NOTE — ED Notes (Signed)
Returned from CT scan Returned from X ray

## 2011-03-10 NOTE — ED Notes (Signed)
Patient is resting comfortably. 

## 2011-03-10 NOTE — ED Notes (Signed)
ekg done and given to dr. Lynelle Doctor.

## 2011-03-10 NOTE — ED Notes (Signed)
Patient transported to CT 

## 2011-03-10 NOTE — ED Notes (Signed)
Patient denies pain and is resting comfortably.  

## 2011-03-10 NOTE — ED Notes (Signed)
LSN 03-09-11 @ 2230. Awoke at 0400 this am noticed whole left side tingly & numb, dizziness, unsteady gait. Became worse at 0800. Denies h/a, blurred vision, slurred speech, facial droop. Drove self to Urgent Care. Denied weakness to left side, gait was unsteady due to lightheadedness, " felt swimmy headed". Presently denies dizziness, continues with numbness

## 2011-03-10 NOTE — ED Provider Notes (Signed)
History     CSN: [id number] Arrival date & time: 03/10/2011  1:28 PM   First MD Initiated Contact with Patient 03/10/11 1405      Chief Complaint  Patient presents with  . Extremity Weakness    (Consider location/radiation/quality/duration/timing/severity/associated sxs/prior treatment) HPI  Patient relates yesterday he lifted to kerosene heaters and moved him from his house out of the shed. He states one was heavier than the other and it is going down the steps he felt "a thing" in his left lower leg that lasted a second. He states it wasn't paying for a second. He denies any limping. He relates a 2 AM he talk to his wife on the phone. At 4:30 AM he woke up and when he tried to walk to the bathroom he was off balance and was stumbling. He denied any spinning sensation or headache. He relates he went back to bed he got up again at 8:30 AM and he still was unsteady on his feet. He relates while eating breakfast about 8:30 he started getting numbness it started in his left foot and spread up his leg his trunk his arm into his left face and head. He also noted he's had loss of hearing in his left ear. He also states a detached his head it feels numb. He states he's never had it before he states the feeling of being off balance is still present. He states the knee not makes him worse lying down makes him feel better  Primary care Dr. Cleta Alberts at urgent care [address]. Cardiologist Dr. Antoine Poche  Past Medical History  Diagnosis Date  . LVF (left ventricular failure)     chronic LV systolic failure; last echo in December of 2011 showed EF of 25%.   . Ischemic heart disease   . Atrial flutter     off amiodarone because of increased LFTs  . Shortness of breath     chronic sob with known COPD and probable chronic bronchitis  . COPD (chronic obstructive pulmonary disease)   . Bronchitis, chronic   . Myocardial infarction 1994  . Gastro - esophageal reflux   . Claudication   . BPH (benign  prostatic hyperplasia)   . CHF (congestive heart failure)   . Coronary artery disease     status post myocardial infarction in 1984,  99 and 07.  He has multiple RCA stents  . Hypertension   . Atrial fibrillation   . Hemorrhoids   . Hyperlipidemia   . Arrhythmia   . Hx of adenomatous colonic polyps   . Hx of colonoscopy     Past Surgical History  Procedure Date  . Cardiac catheterization 2008  . Cataract extraction   . Salivary gland surgery     Family History  Problem Relation Age of Onset  . Coronary artery disease Father   . Heart disease Mother   . Colon cancer Neg Hx   . Heart disease Brother     History  Substance Use Topics  . Smoking status: Former Smoker -- 1.5 packs/day for 55 years    Quit date: 01/03/2009  . Smokeless tobacco: Never Used  . Alcohol Use: Yes     occasional   Lives with spouse   Review of Systems  All other systems reviewed and are negative.    Allergies  Codeine and Sulfonamide derivatives  Home Medications   Current Outpatient Rx  Name Route Sig Dispense Refill  . ACETAMINOPHEN 500 MG PO TABS Oral Take 500 mg  by mouth every 6 (six) hours as needed. As needed for pain.    . ALBUTEROL SULFATE (5 MG/3.5 ML) NEBULIZER SOLUTION Inhalation Inhale 2.5 mg into the lungs every 8 (eight) hours as needed. As needed for shortness of breath.    [name] PROAIR HFA IN Inhalation Inhale 2 puffs into the lungs as needed. As needed for shortness of breath.    . ALPRAZOLAM 0.25 MG PO TABS Oral Take 0.25 mg by mouth at bedtime as needed. As needed for anxiety.     [name] MAALOX MAXIMUM STRENGTH PO Oral Take by mouth.      . ASPIRIN 81 MG PO TABS Oral Take 81 mg by mouth daily.      [name] CALCIUM CARBONATE ANTACID 750 MG PO CHEW Oral Chew by mouth daily.      [name] ESOMEPRAZOLE MAGNESIUM 40 MG PO CPDR Oral Take 40 mg by mouth 2 (two) times daily.     [name] FLUTICASONE-SALMETEROL 250-50 MCG/DOSE IN AEPB Inhalation Inhale 1 puff into the lungs every 12 (twelve) hours.        . FUROSEMIDE 40 MG PO TABS Oral Take 40 mg by mouth daily. As directed    . HYDROCODONE-ACETAMINOPHEN 5-500 MG PO TABS Oral Take 1 tablet by mouth every 6 (six) hours as needed.      [name] LOSARTAN POTASSIUM 100 MG PO TABS Oral Take 100 mg by mouth daily.     [name] MECLIZINE HCL 25 MG PO TABS Oral Take 25 mg by mouth 3 (three) times daily as needed. As needed for dizziness.     [name] METOPROLOL SUCCINATE ER 50 MG PO TB24 Oral Take 50 mg by mouth daily.      Malachy Mood COMPLETE PO Oral Take 1 tablet by mouth every evening.     [name] PRAVASTATIN SODIUM 20 MG PO TABS Oral Take 1 tablet (20 mg total) by mouth every evening. 30 tablet 11  . TAMSULOSIN HCL 0.4 MG PO CAPS Oral Take 0.4 mg by mouth 2 (two) times daily.     [name] BEANO PO Oral Take by mouth.      [name] BISMUTH SUBSALICYLATE 262 MG/15ML PO SUSP Oral Take 15 mLs by mouth every 6 (six) hours as needed.      . CLOPIDOGREL BISULFATE 75 MG PO TABS  TAKE 1 TABLET EVERY OTHER DAY 45 tablet 4  . ROBITUSSIN COLD COUGH+ CHEST PO Oral Take by mouth.      [name] MAGNESIUM HYDROXIDE 400 MG/5ML PO SUSP Oral Take by mouth daily as needed.      [name] NITROGLYCERIN 0.4 MG SL SUBL Sublingual Place 1 tablet (0.4 mg total) under the tongue every 5 (five) minutes as needed for chest pain. 25 tablet 6  . GAS-X ULTRA STRENGTH PO Oral Take by mouth.        BP 137/72  Pulse 65  Temp(Src) 97.5 F (36.4 C) (Oral)  Resp 14  SpO2 100% Vital signs normal  Physical Exam  Nursing note and vitals reviewed. Constitutional: He is oriented to person, place, and time. He appears well-developed and well-nourished.  Non-toxic appearance. He does not appear ill. No distress.  HENT:  Head: Normocephalic and atraumatic.  Right Ear: External ear normal.  Left Ear: External ear normal.  Nose: Nose normal. No mucosal edema or rhinorrhea.  Mouth/Throat: Oropharynx is clear and moist and mucous membranes are normal. No dental abscesses or uvula swelling.  Eyes: Conjunctivae and EOM are normal. Pupils are  equal, round, and reactive to light.  Neck: Normal range of motion and full passive range of motion without pain. Neck supple.       No bruits  Cardiovascular: Normal rate, regular rhythm and normal heart sounds.  Exam reveals no gallop and no friction rub.   No murmur heard. Pulmonary/Chest: Effort normal and breath sounds normal. No respiratory distress. He has no wheezes. He has no rhonchi. He has no rales. He exhibits no tenderness and no crepitus.  Abdominal: Soft. Normal appearance and bowel sounds are normal. He exhibits no distension. There is no tenderness. There is no rebound and no guarding.  Musculoskeletal: Normal range of motion. He exhibits no edema and no tenderness.       Moves all extremities well.   Neurological: He is alert and oriented to person, place, and time. He has normal strength. No cranial nerve deficit.       Patient has no pronator drift he has no weakness in his lower extremities. He has mild difficulty with finger to nose with the left hand with mild past pointing. I was unable to test his hearing because he could not hear finger rubs with his normal right ear.   Skin: Skin is warm, dry and intact. No rash noted. No erythema. No pallor.  Psychiatric: He has a normal mood and affect. His speech is normal and behavior is normal. His mood appears not anxious.    ED Course  Procedures (including critical care time)  Pt remains unchanged during his ED visit  16:20 Dr Konrad Dolores, [facility] admit to tele, Dr Sheffield Slider   Results for orders placed during the hospital encounter of 03/10/11  CBC      Component Value Range   WBC 8.0  4.0 - 10.5 (K/uL)   RBC 3.76 (*) 4.22 - 5.81 (MIL/uL)   Hemoglobin 11.4 (*) 13.0 - 17.0 (g/dL)   HCT 16.1 (*) 09.6 - 52.0 (%)   MCV 91.0  78.0 - 100.0 (fL)   MCH 30.3  26.0 - 34.0 (pg)   MCHC 33.3  30.0 - 36.0 (g/dL)   RDW 04.5  40.9 - 81.1 (%)   Platelets 261  150 - 400 (K/uL)  DIFFERENTIAL      Component Value Range   Neutrophils Relative 75   43 - 77 (%)   Neutro Abs 6.0  1.7 - 7.7 (K/uL)   Lymphocytes Relative 13  12 - 46 (%)   Lymphs Abs 1.1  0.7 - 4.0 (K/uL)   Monocytes Relative 7  3 - 12 (%)   Monocytes Absolute 0.6  0.1 - 1.0 (K/uL)   Eosinophils Relative 4  0 - 5 (%)   Eosinophils Absolute 0.3  0.0 - 0.7 (K/uL)   Basophils Relative 0  0 - 1 (%)   Basophils Absolute 0.0  0.0 - 0.1 (K/uL)  COMPREHENSIVE METABOLIC PANEL      Component Value Range   Sodium 139  135 - 145 (mEq/L)   Potassium 4.0  3.5 - 5.1 (mEq/L)   Chloride 103  96 - 112 (mEq/L)   CO2 27  19 - 32 (mEq/L)   Glucose, Bld 88  70 - 99 (mg/dL)   BUN 21  6 - 23 (mg/dL)   Creatinine, Ser 9.14  0.50 - 1.35 (mg/dL)   Calcium 9.3  8.4 - 78.2 (mg/dL)   Total Protein 6.9  6.0 - 8.3 (g/dL)   Albumin 3.3 (*) 3.5 - 5.2 (g/dL)   AST 17  0 - 37 (U/L)   ALT 12  0 -  53 (U/L)   Alkaline Phosphatase 108  39 - 117 (U/L)   Total Bilirubin 0.3  0.3 - 1.2 (mg/dL)   GFR calc non Af Amer 58 (*) >90 (mL/min)   GFR calc Af Amer 67 (*) >90 (mL/min)  APTT      Component Value Range   aPTT 31  24 - 37 (seconds)  PROTIME-INR      Component Value Range   Prothrombin Time 15.2  11.6 - 15.2 (seconds)   INR 1.18  0.00 - 1.49   POCT I-STAT TROPONIN I      Component Value Range   Troponin i, poc 0.02  0.00 - 0.08 (ng/mL)   Comment 3            Laboratory interpretation normal  Dg Chest 2 View  03/10/2011  *RADIOLOGY REPORT*  Clinical Data: Weakness, "weird" feeling, shakes, CHF, atrial fibrillation, COPD, hypertension, former smoker  CHEST - 2 VIEW  Comparison: 03/09/2010  Findings: Enlargement of cardiac silhouette. Tortuous aorta. Minimal pulmonary vascular congestion. Diffuse chronic interstitial prominence throughout both lungs, improved versus previous exam, question related to chronic failure. Underlying emphysematous changes. No segmental consolidation, pleural effusion or pneumothorax. Bones demineralized.  IMPRESSION: Enlargement of cardiac silhouette with pulmonary  vascular congestion and question minimal chronic failure. Underlying emphysematous changes.  Original Report Authenticated By: Lollie Marrow, M.D.   Ct Head Wo Contrast  03/10/2011  *RADIOLOGY REPORT*  Clinical Data: Acute left-sided hearing loss. Left-sided weakness. Weakness and lethargy.  CT HEAD WITHOUT CONTRAST  Technique:  Contiguous axial images were obtained from the base of the skull through the vertex without contrast.  Comparison: CT head without contrast 03/31/2009.  Findings: A remote right PICA territory infarct is present.  There is a remote lacunar infarct of the left thalamus.  A remote posterior watershed territory infarct on the left is stable.  Mild generalized atrophy is stable.  Mild periventricular white matter hypoattenuation is stable bilaterally as well.  The ventricles are proportionate to the degree of atrophy.  The paranasal sinuses and mastoid air cells are clear.  IMPRESSION:  1.  No acute intracranial abnormality or significant interval change. 2.  Stable remote infarcts involving the right PICA territory, the left thalamus, and left posterior parietal watershed territory. 3.  Stable atrophy and mild white matter disease.  This likely reflects the sequelae of chronic microvascular ischemia.  Original Report Authenticated By: Jamesetta Orleans. MATTERN, M.D.     No results found.  Date: 03/10/2011  Rate: 64  Rhythm: normal sinus rhythm  QRS Axis: normal  Intervals: normal  ST/T Wave abnormalities: nonspecific ST/T changes  Conduction Disutrbances:nonspecific intraventricular conduction delay  Narrative Interpretation:   Old EKG Reviewed: unchanged from 03/06/2010   Diagnoses that have been ruled out:  Diagnoses that are still under consideration:  Final diagnoses:  Stroke   Plan admit  Devoria Albe, MD, Armando Gang    MDM          Ward Givens, MD 03/10/11 [phone]

## 2011-03-11 DIAGNOSIS — I2510 Atherosclerotic heart disease of native coronary artery without angina pectoris: Secondary | ICD-10-CM

## 2011-03-11 DIAGNOSIS — G459 Transient cerebral ischemic attack, unspecified: Secondary | ICD-10-CM

## 2011-03-11 DIAGNOSIS — I6350 Cerebral infarction due to unspecified occlusion or stenosis of unspecified cerebral artery: Secondary | ICD-10-CM

## 2011-03-11 LAB — LIPID PANEL
Cholesterol: 147 mg/dL (ref 0–200)
HDL: 33 mg/dL — ABNORMAL LOW (ref >39)
Triglycerides: 138 mg/dL (ref <150)

## 2011-03-11 LAB — HEMOGLOBIN A1C
Hgb A1c MFr Bld: 6 % — ABNORMAL HIGH (ref <5.7)
Mean Plasma Glucose: 126 mg/dL — ABNORMAL HIGH (ref <117)

## 2011-03-11 MED ORDER — RIVAROXABAN 10 MG PO TABS
20.0000 mg | ORAL_TABLET | Freq: Every day | ORAL | Status: DC
  Filled 2011-03-11: qty 2

## 2011-03-11 MED ORDER — SODIUM CHLORIDE 0.9 % IV SOLN
250.0000 mL | INTRAVENOUS | Status: DC | PRN
Start: 2011-03-11 — End: 2011-03-11

## 2011-03-11 MED ORDER — RIVAROXABAN 20 MG PO TABS: 20.0000 mg | ORAL_TABLET | Freq: Every day | ORAL | Status: DC

## 2011-03-11 NOTE — Progress Notes (Signed)
Family Medicine Teaching Service [facility] Progress Note  Patient name: Andrew [name] Medical record number: [id number] Date of birth: January 18, 1931 Age: 77 y.o. Gender: male    LOS: 1 day   Primary Care Provider: Dois Davenport., MD  Overnight Events: No acute events overnight. Slept well. Feeling better this morning with regards to instability. Still has difficulty with ambulation. Requiring assistance with ambulation. Tolerating regular diet.   Objective: Vital signs in last 24 hours: Temp:  [97.3 F (36.3 C)-98.5 F (36.9 C)] 97.7 F (36.5 C) (12/06 0600) Pulse Rate:  [64-75] 74  (12/06 0930) Resp:  [11-20] 18  (12/06 0930) BP: (136-170)/(69-89) 136/69 mmHg (12/06 0600) SpO2:  [93 %-100 %] 93 % (12/06 0930) Weight:  [185 lb 13.6 oz (84.3 kg)] 185 lb 13.6 oz (84.3 kg) (12/05 1945)  Wt Readings from Last 3 Encounters:  03/10/11 185 lb 13.6 oz (84.3 kg)  01/15/11 185 lb (83.915 kg)  12/29/10 187 lb 6.4 oz (85.004 kg)     Current Facility-Administered Medications  Medication Dose Route Frequency Provider Last Rate Last Dose  . 0.9 %  sodium chloride infusion   Intravenous STAT Iva L Knapp, MD      . 0.9 %  sodium chloride infusion  250 mL Intravenous PRN Clementeen Graham      . acetaminophen (TYLENOL) tablet 500 mg  500 mg Oral Q6H PRN Clementeen Graham      . albuterol (PROVENTIL) (5 MG/ML) 0.5% nebulizer solution 2.5 mg  2.5 mg Nebulization Q8H PRN Clementeen Graham      . ALPRAZolam Prudy Feeler) tablet 0.25 mg  0.25 mg Oral QHS PRN Clementeen Graham      . aspirin chewable tablet 81 mg  81 mg Oral Daily Clementeen Graham      . clopidogrel (PLAVIX) tablet 75 mg  75 mg Oral Q breakfast Evan Corey   75 mg at 03/11/11 0806  . Fluticasone-Salmeterol (ADVAIR) 250-50 MCG/DOSE inhaler 1 puff  1 puff Inhalation Q12H Evan Corey   1 puff at 03/11/11 0928  . furosemide (LASIX) tablet 40 mg  40 mg Oral Daily Clementeen Graham      . HYDROcodone-acetaminophen (NORCO) 5-325 MG per tablet 1 tablet  1 tablet Oral Q6H PRN Clementeen Graham   1  tablet at 03/11/11 0806  . losartan (COZAAR) tablet 100 mg  100 mg Oral Daily Clementeen Graham      . metoprolol (TOPROL-XL) 24 hr tablet 50 mg  50 mg Oral Daily Clementeen Graham      . nitroGLYCERIN (NITROSTAT) SL tablet 0.4 mg  0.4 mg Sublingual Q5 min PRN Clementeen Graham      . pantoprazole (PROTONIX) EC tablet 40 mg  40 mg Oral Daily Clementeen Graham      . simvastatin (ZOCOR) tablet 20 mg  20 mg Oral q1800 Clementeen Graham      . sodium chloride 0.9 % injection 3 mL  3 mL Intravenous Q12H Evan Corey   3 mL at 03/10/11 2200  . sodium chloride 0.9 % injection 3 mL  3 mL Intravenous PRN Clementeen Graham      . Tamsulosin HCl (FLOMAX) capsule 0.4 mg  0.4 mg Oral BID Evan Corey   0.4 mg at 03/10/11 2234  . DISCONTD: albuterol (PROVENTIL) (2.5 MG/3ML) 0.083% aerosol nebs  2.5 mg Inhalation Q8H PRN Clementeen Graham      . DISCONTD: aspirin tablet 81 mg  81 mg Oral Daily Clementeen Graham      . DISCONTD: heparin injection 5,000 Units  5,000 Units Subcutaneous Q8H Evan Corey   5,000 Units at 03/11/11 2130  . DISCONTD: losartan (COZAAR) tablet 100 mg  100 mg Oral Daily Clementeen Graham         PE: Gen: No acute distress, HEENT: The cervical lymphadenopathy CV: Regular rate and rhythm, no murmurs rubs or gallops Res: Clear to auscultation bilaterally, normal effort Abd: Nontender to palpation, positive bowel sounds Ext/Musc: No edema, 2+ pulses Neuro: Cranial nerves grossly intact, patient still with gate instability  Labs/Studies:  Lipid Panel     Component Value Date/Time   CHOL 147 03/11/2011 0535   TRIG 138 03/11/2011 0535   HDL 33* 03/11/2011 0535   CHOLHDL 4.5 03/11/2011 0535   VLDL 28 03/11/2011 0535   LDLCALC 86 03/11/2011 0535      Assessment/Plan: Andrew [name] is a 77 y.o. year old male likely stroke.    1. Stroke: Symptoms improved this morning but still it deficits. Still awaiting carotid Doppler, cardiac echo,  and head MRA MRI. Patient to continue with stroke workup to include hemoglobin A1c, PT, OT, speech eval. Pt  initially on Heparin, but was DC'd this morning. Will discuss efficacy since pt on multiple antiplatelet Rx.   2. CV: Significant past medical history of cardiac vascular disease. Normal cardiac exam at this time. EKG ordered. Will continue w/ current anticoagulation medications unless pt experiencing hemorrhagic stroke. Hemodynamically stable. Will continue with home medication of Plavix, statin, and ASA for coronary artery disease and metoprolol, losartan and lasix for HTN.    3. FEN/GI: NPO until passes swallow study, then regular diet. IV KVOd overnight. Increased to 114ml/hr until able to take PO. Will conintue w/ Miralax for constipation PRN.   4. Respiratory: O2 sats normal on RA. Pt reports needing supplemental O2 at night. Will provide O2 to keep sats above 90%. Will continue home meds of albuterol PRN and advair BID.   5. Prophylaxis: will continue home Plavix, and Nexium. Will discuss restarting Heparin SQ 5000 units TID w/ team   6. Disposition: Pending clinical improvement and further workup.    Signed: Shelly Flatten, MD Family Medicine Resident PGY-1 03/11/2011 10:13 AM

## 2011-03-11 NOTE — Progress Notes (Signed)
*  PRELIMINARY RESULTS* Carotid dopplers performed. Preliminary findings showed no ICA stenosis bilateral.  Andrew [name] 03/11/2011, 3:29 PM

## 2011-03-11 NOTE — Progress Notes (Signed)
  Echocardiogram 2D Echocardiogram was refused by the patient because he had an echo done on 02/11/11.  Enrique Sack, RN was notified.  Dewitt Hoes, RDCS 03/11/2011, 12:23 PM

## 2011-03-11 NOTE — Progress Notes (Signed)
Family Medicine Teaching Service Attending Note  I discussed patient Andrew [name]  with Dr. Konrad Dolores and reviewed their note for today.  I agree with their assessment and plan.     Patient is very high risk for CVA.  Needs full anticoagulation with warfarin or newer agent.   This will not be changed by an MRI or MRA.   Would get carotid doppler because would be possible candidate for stent since appears very functional despite low EF

## 2011-03-11 NOTE — H&P (Signed)
I interviewed and examined this patient and discussed the care plan with Dr.Corey and the FPTS team and agree with assessment and plan as documented in the admission note for last evening.He says that he did have dizziness and tinnitus with the decrease hearing yesterday, but that is nearly back to normal.  He is alert, sitting stably, eating breakfast this AM. Cerebellar testing was normal (slow RAM right hand which he attributes to the evident arthritic changes), but Romberg is still mildly unsteady, but improved with eyes opened. Since he is normally very functional, I recommend adding MRA to his MRI since the old infarcts on his CT and his current symptoms suggest posterior circulation vascular disease, especially since he has CAD.     Andrew Hang A. Sheffield Slider, MD Family Medicine Teaching Service Attending  03/11/2011 9:00 AM

## 2011-03-11 NOTE — Progress Notes (Signed)
Physical Therapy Evaluation Patient Details Name: Andrew [name] MRN: [id number] DOB: 1931-03-14 Today's Date: 03/11/2011  Problem List:  Patient Active Problem List  Diagnoses  . UNSPECIFIED ANEMIA  . COPD UNSPECIFIED  . GERD  . DYSPNEA  . Nonspecific (abnormal) findings on radiological and other examination of body structure  . LIVER FUNCTION TESTS, ABNORMAL, HX OF  . PERSONAL HISTORY OF COLONIC POLYPS  . ABNORMAL LUNG XRAY  . Ischemic heart disease  . Atrial flutter  . LVF (left ventricular failure)  . Hypertension  . Ischemic cardiomyopathy  . V-tach  . Hyperlipidemia  . CAD (coronary artery disease)    Past Medical History:  Past Medical History  Diagnosis Date  . LVF (left ventricular failure)     chronic LV systolic failure; last echo in December of 2011 showed EF of 25%.   . Ischemic heart disease   . Atrial flutter     off amiodarone because of increased LFTs  . Shortness of breath     chronic sob with known COPD and probable chronic bronchitis  . COPD (chronic obstructive pulmonary disease)   . Bronchitis, chronic   . Myocardial infarction 1994  . Gastro - esophageal reflux   . Claudication   . BPH (benign prostatic hyperplasia)   . CHF (congestive heart failure)   . Coronary artery disease     status post myocardial infarction in 1984,  99 and 07.  He has multiple RCA stents  . Hypertension   . Atrial fibrillation   . Hemorrhoids   . Hyperlipidemia   . Arrhythmia   . Hx of adenomatous colonic polyps   . Hx of colonoscopy    Past Surgical History:  Past Surgical History  Procedure Date  . Cardiac catheterization 2008  . Cataract extraction   . Salivary gland surgery     PT Assessment/Plan/Recommendation PT Assessment Clinical Impression Statement: Patient presents with symptoms consistent with possible CVA. Patient with positive symptoms of dizziness/unsteadiness particularly with head rotation to left which inhibits safety with gait due to  mild imbalance with quick neck rotation. Patient has been educated on straegies to improve this with some success. He would benefit from op PT to continue with treatment, but since patient declines I have given him a home exercise program with progressions as he improves.  PT Recommendation/Assessment: Patient will need skilled PT in the acute care venue PT Problem List: Decreased activity tolerance;Decreased balance;Impaired sensation PT Therapy Diagnosis : Difficulty walking PT Plan PT Frequency: Min 4X/week PT Treatment/Interventions: Gait training;Stair training;Therapeutic activities;Therapeutic exercise;Balance training;Patient/family education PT Recommendation Follow Up Recommendations: Outpatient PT - patient declines Equipment Recommended: None recommended by PT PT Goals  Acute Rehab PT Goals PT Goal Formulation: With patient Pt will go Supine/Side to Sit: Independently PT Goal: Supine/Side to Sit - Progress: Other (comment) Pt will go Sit to Supine/Side: Independently PT Goal: Sit to Supine/Side - Progress: Other (comment) Pt will go Sit to Stand: Independently PT Goal: Sit to Stand - Progress: Other (comment) Pt will go Stand to Sit: Independently PT Goal: Stand to Sit - Progress: Other (comment) Pt will Ambulate: >150 feet;Independently PT Goal: Ambulate - Progress: Other (comment) Pt will Go Up / Down Stairs: 3-5 stairs;Independently PT Goal: Up/Down Stairs - Progress: Other (comment) Pt will Perform Home Exercise Program: Independently (For balance and vestibular rehab) PT Goal: Perform Home Exercise Program - Progress: Other (comment)  PT Evaluation Precautions/Restrictions  Precautions Precautions: Fall Restrictions Weight Bearing Restrictions: No Prior Functioning  Home Living  Lives With: Spouse Receives Help From: Family Type of Home: Mobile home Home Layout: One level Home Access: Stairs to enter Entrance Stairs-Rails: None Entrance Stairs-Number of  Steps: 4 Bathroom Shower/Tub: Engineer, manufacturing systems: Standard Home Adaptive Equipment: Walker - rolling Prior Function Level of Independence: Independent with basic ADLs;Independent with homemaking with ambulation Driving: Yes Cognition Cognition Arousal/Alertness: Awake/alert Overall Cognitive Status: Appears within functional limits for tasks assessed Orientation Level: Oriented X4 Sensation/Coordination Sensation Light Touch: Impaired by gross assessment Additional Comments: Decreased light touch left side Coordination Gross Motor Movements are Fluid and Coordinated: Yes VOR - Difficulty suppressing with rotation of head to left and some loss of visual target. Patient also reports dizziness/feeling of needing to "catch up". Saccades - WFL, Smooth pursuits - brief loss of target with transition from end range right toward left Extremity Assessment RLE Assessment RLE Assessment: Within Functional Limits LLE Assessment LLE Assessment: Within Functional Limits Mobility (including Balance) Transfers Transfers: Yes Sit to Stand: 7: Independent;With upper extremity assist;Without upper extremity assist;From chair/3-in-1 Stand to Sit: 7: Independent;Without upper extremity assist;With upper extremity assist;To chair/3-in-1 Ambulation/Gait Ambulation/Gait: Yes Ambulation/Gait Assistance: 5: Supervision Ambulation/Gait Assistance Details (indicate cue type and reason): Patient with mild imbalance with quick head turns particularly to the left. Assistive device: None Gait Pattern: Trunk flexed  Posture/Postural Control Posture/Postural Control: Postural limitations Berg Balance Test Sit to Stand: Able to stand without using hands and stabilize independently Standing Unsupported: Able to stand safely 2 minutes Sitting with Back Unsupported but Feet Supported on Floor or Stool: Able to sit safely and securely 2 minutes Stand to Sit: Sits safely with minimal use of  hands Transfers: Able to transfer safely, minor use of hands Standing Unsupported with Eyes Closed: Able to stand 10 seconds safely Standing Ubsupported with Feet Together: Able to place feet together independently and stand 1 minute safely From Standing, Reach Forward with Outstretched Arm: Can reach confidently >25 cm (10") From Standing Position, Pick up Object from Floor: Able to pick up shoe safely and easily From Standing Position, Turn to Look Behind Over each Shoulder: Looks behind from both sides and weight shifts well Turn 360 Degrees: Able to turn 360 degrees safely in 4 seconds or less (Dizziness noted particularly with turning to the left) Standing Unsupported, Alternately Place Feet on Step/Stool: Able to stand independently and safely and complete 8 steps in 20 seconds Standing Unsupported, One Foot in Front: Able to plae foot ahead of the other independently and hold 30 seconds Standing on One Leg: Tries to lift leg/unable to hold 3 seconds but remains standing independently Total Score: 52  Exercise  Other Exercises Other Exercises: Patient supplied with home exercise program for balance and vestibular issues. Handout supplied. Exercises as follows - tandem stance progression with eyes open - goal 30 seconds. Single limb stance bilaterally with goal of 10 seconds. Gaze stabilization exercise in sitting. Head/neck flexion/extension and  bilateral rotation with patient standing with progression to romberg.  End of Session PT - End of Session Equipment Utilized During Treatment: Gait belt Activity Tolerance: Patient tolerated treatment well Patient left: in chair;with call bell in reach General Behavior During Session: [facility] for tasks performed Cognition: [facility] for tasks performed  Edwyna Perfect, PT  Pager [phone]  03/11/2011, 10:21 AM

## 2011-03-12 ENCOUNTER — Other Ambulatory Visit: Payer: Self-pay | Admitting: Family Medicine

## 2011-03-12 MED ORDER — RIVAROXABAN 20 MG PO TABS: 20.0000 mg | ORAL_TABLET | Freq: Every day | ORAL | Status: DC

## 2011-03-14 NOTE — Discharge Summary (Signed)
I have reviewed this discharge summary and agree.    

## 2011-03-14 NOTE — Discharge Summary (Signed)
Family Medicine Resident Discharge Summary  Patient ID: JHOEL STIEG [id number] 77 y.o. January 02, 1931  Admit date: 03/10/2011  Discharge date and time: 03/11/2011  8:54 PM   Admitting Physician: Tobin Chad   Discharge Physician: Ida Rogue, MD  Admission Diagnoses: Stroke [434.91] Stroke  Discharge Diagnoses: TIA  Admission Condition: fair  Discharged Condition: good  Indication for Admission: Stroke like symptoms  Hospital Course: 77 year old male with past medical history significant for CHF, MI, A. fib, COPD with likely TIA  TIA: Patient presented with acute left-sided weakness and gait instability with mild left hearing impairment. Stroke protocol was initiated. Patient's CT consistent with multiple areas of old ischemic changes. No acute process noted. Patient was made n.p.o. until patient was able to pass swallow eval. Patient's symptoms significantly better by the following morning so MRI was not done. Patient with recent prior echo one month ago. Patient with significant cardiac disease. Source of TIA likely cardiac embolic thrombus do to history of A. fib, EF approximately 15%, and significant akinesis and hypokinesis. Carotid Dopplers were noted to be normal. Patient on Plavix and aspirin for multiple cardiac stents. Patient noted to be at high risk of future stroke as CHADS2 score of 5 was noted. Risk benefits of anticoagulation therapy was discussed with patient. Patient started on Xeralto during hospitalization and given prescription to take 20 mg once daily. Patient understanding and agreeable to therapy. Patient's neurological exam virtually back at baseline at time of discharge.  Cardiovascular: Patient hemodynamically stable throughout hospitalization. Discussed patient's condition and obtained records from cardiologist's office. Patient to followup with cardiologist, Dr. Lonna Cobb, next week, to discuss further management of condition.  Consults:  none  Significant Diagnostic Studies: Head CT:  1. No acute intracranial abnormality or significant interval  change.  2. Stable remote infarcts involving the right PICA territory, the  left thalamus, and left posterior parietal watershed territory.  3. Stable atrophy and mild white matter disease. This likely  reflects the sequelae of chronic microvascular ischemia.  CXR: Enlargement of cardiac silhouette with pulmonary vascular congestion and question minimal chronic failure.  Underlying emphysematous changes.  Carotid Doppler: Preliminary results - "No ICA stenosis bilateral."  EKG: 1st degree AV block  Hgb A1c: 6 Lipid panel: Total 147, Tri 138, HDL 33, LDL 28, VLDL 28  Discharge Exam: Gen: No acute distress,  HEENT: The cervical lymphadenopathy  CV: Faint heart sounds Regular rate and rhythm, no murmurs rubs or gallops  Res: Clear to auscultation bilaterally, normal effort  Abd: Nontender to palpation, positive bowel sounds  Ext/Musc: No edema, 2+ pulses  Neuro: Cranial nerves grossly intact, Mild hearing impairment on the left. Mild gate instability (particular difficulty walking heal to toe, pt felt like this is baseline), Minimal unsteadiness during Romberg.    Disposition: home  Patient Instructions:  Discharge Medication List as of 03/11/2011  8:11 PM    START taking these medications   Details  rivaroxaban 20 MG TABS Take 20 mg by mouth daily with supper., Starting 03/11/2011, Until Discontinued, Normal      CONTINUE these medications which have NOT CHANGED   Details  acetaminophen (TYLENOL) 500 MG tablet Take 500 mg by mouth every 6 (six) hours as needed. As needed for pain., Until Discontinued, Historical Med    albuterol (2.5 MG/3ML) 0.083% NEBU 3 mL, albuterol (5 MG/ML) 0.5% NEBU 0.5 mL Inhale 2.5 mg into the lungs every 8 (eight) hours as needed. As needed for shortness of breath., Until Discontinued, Historical Med  ALPRAZolam (XANAX) 0.25 MG tablet  Take 0.25 mg by mouth at bedtime as needed. As needed for anxiety. , Starting 06/23/2010, Until Discontinued, Historical Med    Alum & Mag Hydroxide-Simeth (MAALOX MAXIMUM STRENGTH PO) Take by mouth.  , Until Discontinued, Historical Med    aspirin 81 MG tablet Take 81 mg by mouth daily.  , Until Discontinued, Historical Med    bismuth subsalicylate (PEPTO BISMOL) 262 MG/15ML suspension Take 15 mLs by mouth every 6 (six) hours as needed.  , Until Discontinued, Historical Med    calcium carbonate (TUMS EX) 750 MG chewable tablet Chew by mouth daily.  , Until Discontinued, Historical Med    clopidogrel (PLAVIX) 75 MG tablet TAKE 1 TABLET EVERY OTHER DAY, Normal    esomeprazole (NEXIUM) 40 MG capsule Take 40 mg by mouth 2 (two) times daily. , Starting 01/11/2011, Until Discontinued, Historical Med    Fluticasone-Salmeterol (ADVAIR) 250-50 MCG/DOSE AEPB Inhale 1 puff into the lungs every 12 (twelve) hours.  , Until Discontinued, Historical Med    furosemide (LASIX) 40 MG tablet Take 40 mg by mouth daily. As directed, Starting 11/14/2010, Until Discontinued, Historical Med    HYDROcodone-acetaminophen (VICODIN) 5-500 MG per tablet Take 1 tablet by mouth every 6 (six) hours as needed.  , Until Discontinued, Historical Med    losartan (COZAAR) 100 MG tablet Take 100 mg by mouth daily. , Until Discontinued, Historical Med    magnesium hydroxide (MILK OF MAGNESIA) 400 MG/5ML suspension Take by mouth daily as needed.  , Until Discontinued, Historical Med    meclizine (ANTIVERT) 25 MG tablet Take 25 mg by mouth 3 (three) times daily as needed. As needed for dizziness. , Until Discontinued, Historical Med    metoprolol (TOPROL-XL) 50 MG 24 hr tablet Take 50 mg by mouth daily.  , Until Discontinued, Historical Med    Multiple Vitamins-Minerals (MULTI COMPLETE PO) Take 1 tablet by mouth every evening. , Until Discontinued, Historical Med    pravastatin (PRAVACHOL) 20 MG tablet Take 1 tablet (20 mg  total) by mouth every evening., Starting 10/16/2010, Until Sat 10/16/11, Normal    Simethicone (GAS-X ULTRA STRENGTH PO) Take by mouth.  , Until Discontinued, Historical Med    Tamsulosin HCl (FLOMAX) 0.4 MG CAPS Take 0.4 mg by mouth 2 (two) times daily. , Until Discontinued, Historical Med    Albuterol Sulfate (PROAIR HFA IN) Inhale 2 puffs into the lungs as needed. As needed for shortness of breath., Until Discontinued, Historical Med    Alpha-D-Galactosidase (BEANO PO) Take by mouth.  , Until Discontinued, Historical Med    Dextromethorphan-Guaifenesin (ROBITUSSIN COLD COUGH+ CHEST PO) Take by mouth.  , Until Discontinued, Historical Med    nitroGLYCERIN (NITROSTAT) 0.4 MG SL tablet Place 1 tablet (0.4 mg total) under the tongue every 5 (five) minutes as needed for chest pain., Starting 12/29/2010, Until Wed 12/29/11, Normal       Activity: activity as tolerated, Pt given instructions to refrain from strenuous exercising or heavy lifting Diet: regular diet Wound Care: none needed  Follow-up with Dr .Cleta Alberts on 03/16/11 at 4pm, and with [facility] cardiology on 03/18/11.   Follow-up Items: 1. Anticoagulation regimen 2. Cardiologist f/u for defibrillator due to EF of 15%  Signed: Shelly Flatten, MD Family Medicine Resident PGY-1 03/14/2011 1:56 AM

## 2011-03-16 ENCOUNTER — Ambulatory Visit (INDEPENDENT_AMBULATORY_CARE_PROVIDER_SITE_OTHER): Payer: Medicare Other | Admitting: Emergency Medicine

## 2011-03-16 DIAGNOSIS — I6789 Other cerebrovascular disease: Secondary | ICD-10-CM

## 2011-03-16 DIAGNOSIS — I2510 Atherosclerotic heart disease of native coronary artery without angina pectoris: Secondary | ICD-10-CM

## 2011-03-16 DIAGNOSIS — H5710 Ocular pain, unspecified eye: Secondary | ICD-10-CM

## 2011-03-18 ENCOUNTER — Institutional Professional Consult (permissible substitution): Payer: Medicare Other | Admitting: Internal Medicine

## 2011-04-04 ENCOUNTER — Other Ambulatory Visit: Payer: Self-pay

## 2011-04-04 ENCOUNTER — Ambulatory Visit (INDEPENDENT_AMBULATORY_CARE_PROVIDER_SITE_OTHER): Payer: Medicare Other

## 2011-04-04 ENCOUNTER — Emergency Department (HOSPITAL_COMMUNITY): Payer: Medicare Other

## 2011-04-04 ENCOUNTER — Inpatient Hospital Stay (HOSPITAL_COMMUNITY)
Admission: EM | Admit: 2011-04-04 | Discharge: 2011-04-08 | DRG: 193 | Disposition: A | Discharge status: Home / Self Care | Payer: Medicare Other | Attending: Family Medicine | Admitting: Family Medicine

## 2011-04-04 ENCOUNTER — Encounter (HOSPITAL_COMMUNITY): Payer: Self-pay | Admitting: Emergency Medicine

## 2011-04-04 DIAGNOSIS — Z8673 Personal history of transient ischemic attack (TIA), and cerebral infarction without residual deficits: Secondary | ICD-10-CM

## 2011-04-04 DIAGNOSIS — Z87891 Personal history of nicotine dependence: Secondary | ICD-10-CM

## 2011-04-04 DIAGNOSIS — R05 Cough: Secondary | ICD-10-CM

## 2011-04-04 DIAGNOSIS — I10 Essential (primary) hypertension: Secondary | ICD-10-CM | POA: Insufficient documentation

## 2011-04-04 DIAGNOSIS — Z9861 Coronary angioplasty status: Secondary | ICD-10-CM

## 2011-04-04 DIAGNOSIS — I4892 Unspecified atrial flutter: Secondary | ICD-10-CM

## 2011-04-04 DIAGNOSIS — I509 Heart failure, unspecified: Secondary | ICD-10-CM | POA: Diagnosis present

## 2011-04-04 DIAGNOSIS — J441 Chronic obstructive pulmonary disease with (acute) exacerbation: Secondary | ICD-10-CM | POA: Diagnosis present

## 2011-04-04 DIAGNOSIS — I2589 Other forms of chronic ischemic heart disease: Secondary | ICD-10-CM | POA: Diagnosis present

## 2011-04-04 DIAGNOSIS — I447 Left bundle-branch block, unspecified: Secondary | ICD-10-CM | POA: Diagnosis present

## 2011-04-04 DIAGNOSIS — I5023 Acute on chronic systolic (congestive) heart failure: Secondary | ICD-10-CM | POA: Diagnosis present

## 2011-04-04 DIAGNOSIS — I4891 Unspecified atrial fibrillation: Secondary | ICD-10-CM

## 2011-04-04 DIAGNOSIS — R7402 Elevation of levels of lactic acid dehydrogenase (LDH): Secondary | ICD-10-CM | POA: Diagnosis present

## 2011-04-04 DIAGNOSIS — Z860100 Personal history of colon polyps, unspecified: Secondary | ICD-10-CM

## 2011-04-04 DIAGNOSIS — D649 Anemia, unspecified: Secondary | ICD-10-CM

## 2011-04-04 DIAGNOSIS — J449 Chronic obstructive pulmonary disease, unspecified: Secondary | ICD-10-CM

## 2011-04-04 DIAGNOSIS — I472 Ventricular tachycardia, unspecified: Secondary | ICD-10-CM | POA: Diagnosis present

## 2011-04-04 DIAGNOSIS — I2510 Atherosclerotic heart disease of native coronary artery without angina pectoris: Secondary | ICD-10-CM | POA: Insufficient documentation

## 2011-04-04 DIAGNOSIS — Z882 Allergy status to sulfonamides status: Secondary | ICD-10-CM

## 2011-04-04 DIAGNOSIS — I255 Ischemic cardiomyopathy: Secondary | ICD-10-CM | POA: Insufficient documentation

## 2011-04-04 DIAGNOSIS — I501 Left ventricular failure: Secondary | ICD-10-CM

## 2011-04-04 DIAGNOSIS — N289 Disorder of kidney and ureter, unspecified: Secondary | ICD-10-CM

## 2011-04-04 DIAGNOSIS — N179 Acute kidney failure, unspecified: Secondary | ICD-10-CM | POA: Diagnosis present

## 2011-04-04 DIAGNOSIS — I4729 Other ventricular tachycardia: Secondary | ICD-10-CM | POA: Diagnosis present

## 2011-04-04 DIAGNOSIS — J159 Unspecified bacterial pneumonia: Secondary | ICD-10-CM

## 2011-04-04 DIAGNOSIS — J189 Pneumonia, unspecified organism: Principal | ICD-10-CM | POA: Diagnosis present

## 2011-04-04 DIAGNOSIS — Z9981 Dependence on supplemental oxygen: Secondary | ICD-10-CM

## 2011-04-04 DIAGNOSIS — R7401 Elevation of levels of liver transaminase levels: Secondary | ICD-10-CM | POA: Diagnosis present

## 2011-04-04 DIAGNOSIS — I252 Old myocardial infarction: Secondary | ICD-10-CM

## 2011-04-04 DIAGNOSIS — Z886 Allergy status to analgesic agent status: Secondary | ICD-10-CM

## 2011-04-04 LAB — CBC
HCT: 29.9 % — ABNORMAL LOW (ref 39.0–52.0)
HCT: 28.6 % — ABNORMAL LOW (ref 39.0–52.0)
MCHC: 33.6 g/dL (ref 30.0–36.0)
MCV: 91.7 fL (ref 78.0–100.0)
MCV: 91.7 fL (ref 78.0–100.0)
Platelets: 254 10*3/uL (ref 150–400)
RBC: 3.26 MIL/uL — ABNORMAL LOW (ref 4.22–5.81)
RDW: 15 % (ref 11.5–15.5)
WBC: 16.5 10*3/uL — ABNORMAL HIGH (ref 4.0–10.5)
WBC: 15.5 10*3/uL — ABNORMAL HIGH (ref 4.0–10.5)

## 2011-04-04 LAB — COMPREHENSIVE METABOLIC PANEL
ALT: 16 U/L (ref 0–53)
AST: 24 U/L (ref 0–37)
CO2: 25 mEq/L (ref 19–32)
Calcium: 8.9 mg/dL (ref 8.4–10.5)
Chloride: 100 mEq/L (ref 96–112)
Creatinine, Ser: 1.68 mg/dL — ABNORMAL HIGH (ref 0.50–1.35)
GFR calc Af Amer: 43 mL/min — ABNORMAL LOW (ref >90)
GFR calc non Af Amer: 37 mL/min — ABNORMAL LOW (ref >90)
Glucose, Bld: 125 mg/dL — ABNORMAL HIGH (ref 70–99)
Sodium: 136 mEq/L (ref 135–145)
Total Bilirubin: 0.7 mg/dL (ref 0.3–1.2)

## 2011-04-04 LAB — POCT I-STAT 3, ART BLOOD GAS (G3+)
Bicarbonate: 23.3 mEq/L (ref 20.0–24.0)
TCO2: 24 mmol/L (ref 0–100)
pH, Arterial: 7.475 — ABNORMAL HIGH (ref 7.350–7.450)
pO2, Arterial: 77 mmHg — ABNORMAL LOW (ref 80.0–100.0)

## 2011-04-04 LAB — CARDIAC PANEL(CRET KIN+CKTOT+MB+TROPI)
CK, MB: 3.1 ng/mL (ref 0.3–4.0)
Total CK: 122 U/L (ref 7–232)
Troponin I: <0.3 ng/mL (ref <0.30)

## 2011-04-04 LAB — DIFFERENTIAL
Basophils Absolute: 0 10*3/uL (ref 0.0–0.1)
Eosinophils Relative: 0 % (ref 0–5)
Lymphocytes Relative: 5 % — ABNORMAL LOW (ref 12–46)
Lymphs Abs: 0.8 10*3/uL (ref 0.7–4.0)
Monocytes Absolute: 0.6 10*3/uL (ref 0.1–1.0)
Neutro Abs: 15 10*3/uL — ABNORMAL HIGH (ref 1.7–7.7)

## 2011-04-04 LAB — APTT: aPTT: 39 seconds — ABNORMAL HIGH (ref 24–37)

## 2011-04-04 MED ORDER — IPRATROPIUM BROMIDE 0.02 % IN SOLN
0.5000 mg | Freq: Four times a day (QID) | RESPIRATORY_TRACT | Status: DC | PRN
  Administered 2011-04-05 – 2011-04-06 (×3): 0.5 mg via RESPIRATORY_TRACT
  Filled 2011-04-04 (×5): qty 2.5

## 2011-04-04 MED ORDER — PANTOPRAZOLE SODIUM 40 MG PO TBEC
40.0000 mg | DELAYED_RELEASE_TABLET | Freq: Every day | ORAL | Status: DC
  Administered 2011-04-05 – 2011-04-08 (×4): 40 mg via ORAL
  Filled 2011-04-04 (×4): qty 1

## 2011-04-04 MED ORDER — FLUTICASONE-SALMETEROL 250-50 MCG/DOSE IN AEPB
1.0000 | INHALATION_SPRAY | Freq: Two times a day (BID) | RESPIRATORY_TRACT | Status: DC
Start: 2011-04-04 — End: 2011-04-08
  Administered 2011-04-05 – 2011-04-08 (×8): 1 via RESPIRATORY_TRACT
  Filled 2011-04-04: qty 14

## 2011-04-04 MED ORDER — SIMVASTATIN 10 MG PO TABS
10.0000 mg | ORAL_TABLET | Freq: Every day | ORAL | Status: DC
  Administered 2011-04-05 – 2011-04-07 (×3): 10 mg via ORAL
  Filled 2011-04-04 (×4): qty 1

## 2011-04-04 MED ORDER — ALBUTEROL SULFATE (5 MG/ML) 0.5% IN NEBU
2.5000 mg | INHALATION_SOLUTION | RESPIRATORY_TRACT | Status: DC
  Administered 2011-04-05 – 2011-04-06 (×8): 2.5 mg via RESPIRATORY_TRACT
  Filled 2011-04-04 (×8): qty 0.5

## 2011-04-04 MED ORDER — METOPROLOL SUCCINATE ER 50 MG PO TB24
50.0000 mg | ORAL_TABLET | Freq: Every day | ORAL | Status: DC
  Administered 2011-04-05 – 2011-04-07 (×3): 50 mg via ORAL
  Filled 2011-04-04 (×3): qty 1

## 2011-04-04 MED ORDER — CLOPIDOGREL BISULFATE 75 MG PO TABS
75.0000 mg | ORAL_TABLET | ORAL | Status: DC
  Administered 2011-04-05 – 2011-04-07 (×2): 75 mg via ORAL
  Filled 2011-04-04 (×3): qty 1

## 2011-04-04 MED ORDER — SODIUM CHLORIDE 0.9 % IJ SOLN
3.0000 mL | Freq: Two times a day (BID) | INTRAMUSCULAR | Status: DC
  Administered 2011-04-04 – 2011-04-08 (×8): 3 mL via INTRAVENOUS

## 2011-04-04 MED ORDER — HEPARIN SODIUM (PORCINE) 5000 UNIT/ML IJ SOLN: 5000.0000 [IU] | Freq: Three times a day (TID) | INTRAMUSCULAR | Status: DC

## 2011-04-04 MED ORDER — ASPIRIN 81 MG PO CHEW
81.0000 mg | CHEWABLE_TABLET | Freq: Every day | ORAL | Status: DC
  Administered 2011-04-05 – 2011-04-07 (×3): 81 mg via ORAL
  Filled 2011-04-04 (×4): qty 1

## 2011-04-04 MED ORDER — SODIUM CHLORIDE 0.9 % IJ SOLN
3.0000 mL | INTRAMUSCULAR | Status: DC | PRN
  Administered 2011-04-07: 3 mL via INTRAVENOUS

## 2011-04-04 MED ORDER — SODIUM CHLORIDE 0.9 % IV SOLN: 250.0000 mL | INTRAVENOUS | Status: DC | PRN

## 2011-04-04 MED ORDER — MOXIFLOXACIN HCL 400 MG PO TABS
400.0000 mg | ORAL_TABLET | Freq: Every day | ORAL | Status: DC
  Administered 2011-04-05 – 2011-04-07 (×3): 400 mg via ORAL
  Filled 2011-04-04 (×4): qty 1

## 2011-04-04 MED ORDER — RIVAROXABAN 15 MG PO TABS
15.0000 mg | ORAL_TABLET | Freq: Every day | ORAL | Status: DC
  Administered 2011-04-04 – 2011-04-07 (×4): 15 mg via ORAL
  Filled 2011-04-04 (×5): qty 1

## 2011-04-04 MED ORDER — RIVAROXABAN 20 MG PO TABS: 20.0000 mg | ORAL_TABLET | Freq: Every day | ORAL | Status: DC

## 2011-04-04 MED ORDER — ACETAMINOPHEN 500 MG PO TABS
1000.0000 mg | ORAL_TABLET | Freq: Two times a day (BID) | ORAL | Status: DC | PRN
  Filled 2011-04-04: qty 2

## 2011-04-04 MED ORDER — ASPIRIN 81 MG PO TABS: 81.0000 mg | ORAL_TABLET | Freq: Every day | ORAL | Status: DC

## 2011-04-04 MED ORDER — TAMSULOSIN HCL 0.4 MG PO CAPS
0.4000 mg | ORAL_CAPSULE | Freq: Two times a day (BID) | ORAL | Status: DC
  Administered 2011-04-04 – 2011-04-08 (×8): 0.4 mg via ORAL
  Filled 2011-04-04 (×9): qty 1

## 2011-04-04 NOTE — ED Provider Notes (Signed)
History     CSN: [id number]  Arrival date & time 04/04/11  1529   First MD Initiated Contact with Patient 04/04/11 1542      Chief Complaint  Patient presents with  . Shortness of Breath  . Cough  . Weakness    (Consider location/radiation/quality/duration/timing/severity/associated sxs/prior treatment) HPI Comments: Pt is a 77 year old man with a history of COPD and CHF who has been sick since last Wednesday, 4 days ago, feeling fatigue, shortness of breath and cough.  He was seen at [facility], where he had a chest x-ray that supposedly showed pneumonia.  He was given IM Rocephin and sent to the [facility] ED for admission.  The Family Practice Resident on call had been contacted by [facility].    Patient is a 77 y.o. male presenting with shortness of breath.  Shortness of Breath  The current episode started 3 to 5 days ago. The problem occurs continuously. The problem has been gradually worsening. The problem is moderate. The symptoms are relieved by nothing. Exacerbated by: Any exertion. Associated symptoms include a fever, cough and shortness of breath. The cough is productive. Color change with cough: Yellow sputum. Nothing relieves the cough. The cough is worsened by activity. He was not exposed to toxic fumes. He has not inhaled smoke recently. Steroid use: Using a fluticasone-salmetrol inhaler. He has had prior hospitalizations. He has had no prior intubations. Recently, medical care has been given at this facility.    Past Medical History  Diagnosis Date  . LVF (left ventricular failure)     chronic LV systolic failure; last echo in December of 2011 showed EF of 25%.   . Ischemic heart disease   . Atrial flutter     off amiodarone because of increased LFTs  . Shortness of breath     chronic sob with known COPD and probable chronic bronchitis  . COPD (chronic obstructive pulmonary disease)   . Bronchitis, chronic   . Myocardial infarction 1994  . Gastro -  esophageal reflux   . Claudication   . BPH (benign prostatic hyperplasia)   . CHF (congestive heart failure)   . Coronary artery disease     status post myocardial infarction in 1984,  99 and 07.  He has multiple RCA stents  . Hypertension   . Atrial fibrillation   . Hemorrhoids   . Hyperlipidemia   . Arrhythmia   . Hx of adenomatous colonic polyps   . Hx of colonoscopy     Past Surgical History  Procedure Date  . Cardiac catheterization 2008  . Cataract extraction   . Salivary gland surgery   . Coronary angioplasty with stent placement     Family History  Problem Relation Age of Onset  . Coronary artery disease Father   . Heart disease Mother   . Colon cancer Neg Hx   . Heart disease Brother     History  Substance Use Topics  . Smoking status: Former Smoker -- 1.5 packs/day for 55 years    Quit date: 01/03/2009  . Smokeless tobacco: Never Used  . Alcohol Use: Yes     occasional      Review of Systems  Constitutional: Positive for fever.  HENT: Negative.   Eyes: Negative.   Respiratory: Positive for cough and shortness of breath.   Cardiovascular: Negative.   Gastrointestinal: Negative.   Genitourinary: Negative.   Musculoskeletal: Negative.   Neurological: Negative.   Hematological: Negative.   Psychiatric/Behavioral: Negative.  Allergies  Codeine and Sulfonamide derivatives  Home Medications   Current Outpatient Rx  Name Route Sig Dispense Refill  . ACETAMINOPHEN 500 MG PO TABS Oral Take 1,000 mg by mouth every 12 (twelve) hours as needed. For pain.    . ALBUTEROL SULFATE (5 MG/3.5 ML) NEBULIZER SOLUTION Inhalation Inhale 2.5 mg into the lungs 3 (three) times daily as needed. For shortness of breath.    [name] PROAIR HFA IN Inhalation Inhale 2 puffs into the lungs as needed. Shortness of breath.    . ALPRAZOLAM 0.25 MG PO TABS Oral Take 0.25 mg by mouth at bedtime as needed. For anxiety.    . ASPIRIN 81 MG PO TABS Oral Take 81 mg by mouth daily.        [name] CLOPIDOGREL BISULFATE 75 MG PO TABS Oral Take 75 mg by mouth every other day.      Lenn Sink COLD COUGH+ CHEST PO Oral Take 10 mLs by mouth 2 (two) times daily as needed. For cold and cough    . ESOMEPRAZOLE MAGNESIUM 40 MG PO CPDR Oral Take 40 mg by mouth 2 (two) times daily.     [name] FLUTICASONE-SALMETEROL 250-50 MCG/DOSE IN AEPB Inhalation Inhale 1 puff into the lungs every 12 (twelve) hours.     . FUROSEMIDE 40 MG PO TABS Oral Take 40-80 mg by mouth daily. 80 mg if weight >186    . HYDROCODONE-ACETAMINOPHEN 5-500 MG PO TABS Oral Take 1 tablet by mouth every 12 (twelve) hours. For pain.    [name] LOSARTAN POTASSIUM 100 MG PO TABS Oral Take 100 mg by mouth daily.     [name] MECLIZINE HCL 25 MG PO TABS Oral Take 25 mg by mouth 3 (three) times daily as needed. As needed for dizziness.     [name] METOPROLOL SUCCINATE ER 50 MG PO TB24 Oral Take 50 mg by mouth daily.      [name] PRAVASTATIN SODIUM 20 MG PO TABS Oral Take 1 tablet (20 mg total) by mouth every evening. 30 tablet 11  . PRENATAL MULTIVITAMIN CH Oral Take 1 tablet by mouth every evening.      [name] RIVAROXABAN 20 MG PO TABS Oral Take 20 mg by mouth daily with supper. 30 tablet 0  . GAS-X ULTRA STRENGTH PO Oral Take 2 tablets by mouth as needed. For indigestion    . TAMSULOSIN HCL 0.4 MG PO CAPS Oral Take 0.4 mg by mouth 2 (two) times daily.     [name] MAALOX MAXIMUM STRENGTH PO Oral Take 30 mLs by mouth daily as needed. For constipation    . CALCIUM CARBONATE ANTACID 750 MG PO CHEW Oral Chew 3 tablets by mouth daily as needed. For upset stomach.    [name] MAGNESIUM HYDROXIDE 400 MG/5ML PO SUSP Oral Take 45 mLs by mouth daily as needed.     [name] NITROGLYCERIN 0.4 MG SL SUBL Sublingual Place 1 tablet (0.4 mg total) under the tongue every 5 (five) minutes as needed for chest pain. 25 tablet 6    BP 118/83  Pulse 102  Temp(Src) 97.1 F (36.2 C) (Oral)  Resp 26  Ht 6' (1.829 m)  Wt 186 lb (84.369 kg)  BMI 25.23 kg/m2  SpO2 98%  Physical Exam  Constitutional: He is  oriented to person, place, and time.       Elderly man who is short of breath and ill-appearing.  He is breathing nasal oxygen.    HENT:  Head: Normocephalic and atraumatic.  Right Ear: External ear normal.  Left Ear: External ear normal.  Eyes: Conjunctivae and EOM are normal. Pupils are equal, round, and reactive to light.  Neck: Neck supple.  Cardiovascular: Normal rate, regular rhythm and normal heart sounds.   Pulmonary/Chest: Effort normal. He has no wheezes.       Occasional rhonchi bilaterally.  Abdominal: Soft. Bowel sounds are normal.  Musculoskeletal: Normal range of motion.  Lymphadenopathy:    He has no cervical adenopathy.  Neurological: He is alert and oriented to person, place, and time.       No sensory or motor deficit.   Skin: Skin is warm and dry.  Psychiatric: He has a normal mood and affect. His behavior is normal.    ED Course  Procedures (including critical care time)   Labs Reviewed  LACTIC ACID, PLASMA  PROCALCITONIN  CULTURE, BLOOD (ROUTINE X 2)  CULTURE, BLOOD (ROUTINE X 2)  PROTIME-INR  APTT  CBC  DIFFERENTIAL  COMPREHENSIVE METABOLIC PANEL  URINALYSIS, ROUTINE W REFLEX MICROSCOPIC  URINE CULTURE  BLOOD GAS, ARTERIAL    4:12 PM  Date: 04/04/2011  Rate: 101  Rhythm: sinus tachycardia  QRS Axis: normal  Intervals: normal  ST/T Wave abnormalities: normal  Conduction Disutrbances:left bundle branch block  Narrative Interpretation: Abnormal EKG.  Old EKG Reviewed: unchanged since tracing of 03/10/2011.  4:35 PM Pt seen --> physical exam performed.  Lab workup ordered.  Call to [facility] to see pt.  4:53 PM [facility] called back--will call them back when the results come back.   6:02 PM Results for orders placed during the hospital encounter of 04/04/11  LACTIC ACID, PLASMA      Component Value Range   Lactic Acid, Venous 1.7  0.5 - 2.2 (mmol/L)  PROCALCITONIN      Component Value Range   Procalcitonin 0.91    PROTIME-INR       Component Value Range   Prothrombin Time 19.8 (*) 11.6 - 15.2 (seconds)   INR 1.65 (*) 0.00 - 1.49   APTT      Component Value Range   aPTT 39 (*) 24 - 37 (seconds)  CBC      Component Value Range   WBC 16.5 (*) 4.0 - 10.5 (K/uL)   RBC 3.26 (*) 4.22 - 5.81 (MIL/uL)   Hemoglobin 10.1 (*) 13.0 - 17.0 (g/dL)   HCT 40.9 (*) 81.1 - 52.0 (%)   MCV 91.7  78.0 - 100.0 (fL)   MCH 31.0  26.0 - 34.0 (pg)   MCHC 33.8  30.0 - 36.0 (g/dL)   RDW 91.4  78.2 - 95.6 (%)   Platelets 246  150 - 400 (K/uL)  DIFFERENTIAL      Component Value Range   Neutrophils Relative 91 (*) 43 - 77 (%)   Neutro Abs 15.0 (*) 1.7 - 7.7 (K/uL)   Lymphocytes Relative 5 (*) 12 - 46 (%)   Lymphs Abs 0.8  0.7 - 4.0 (K/uL)   Monocytes Relative 4  3 - 12 (%)   Monocytes Absolute 0.6  0.1 - 1.0 (K/uL)   Eosinophils Relative 0  0 - 5 (%)   Eosinophils Absolute 0.0  0.0 - 0.7 (K/uL)   Basophils Relative 0  0 - 1 (%)   Basophils Absolute 0.0  0.0 - 0.1 (K/uL)  COMPREHENSIVE METABOLIC PANEL      Component Value Range   Sodium 136  135 - 145 (mEq/L)   Potassium 3.7  3.5 - 5.1 (mEq/L)   Chloride 100  96 - 112 (  mEq/L)   CO2 25  19 - 32 (mEq/L)   Glucose, Bld 125 (*) 70 - 99 (mg/dL)   BUN 45 (*) 6 - 23 (mg/dL)   Creatinine, Ser 1.61 (*) 0.50 - 1.35 (mg/dL)   Calcium 8.9  8.4 - 09.6 (mg/dL)   Total Protein 7.0  6.0 - 8.3 (g/dL)   Albumin 2.8 (*) 3.5 - 5.2 (g/dL)   AST 24  0 - 37 (U/L)   ALT 16  0 - 53 (U/L)   Alkaline Phosphatase 85  39 - 117 (U/L)   Total Bilirubin 0.7  0.3 - 1.2 (mg/dL)   GFR calc non Af Amer 37 (*) >90 (mL/min)   GFR calc Af Amer 43 (*) >90 (mL/min)  POCT I-STAT 3, BLOOD GAS (G3+)      Component Value Range   pH, Arterial 7.475 (*) 7.350 - 7.450    pCO2 arterial 31.6 (*) 35.0 - 45.0 (mmHg)   pO2, Arterial 77.0 (*) 80.0 - 100.0 (mmHg)   Bicarbonate 23.3  20.0 - 24.0 (mEq/L)   TCO2 24  0 - 100 (mmol/L)   O2 Saturation 96.0     Collection site RADIAL, ALLEN'S TEST ACCEPTABLE     Drawn by Operator      Sample type ARTERIAL     Dg Chest 2 View  04/04/2011  *RADIOLOGY REPORT*  Clinical Data: Shortness of breath with cough and fever for 4 days. Recently diagnosed pneumonia.  CHEST - 2 VIEW  Comparison: 03/09/2010 and 03/10/2011 radiographs.  Findings: There is stable cardiomegaly with chronic interstitial prominence.  Compared with most recent examination, there are new patchy left greater than right basilar and perihilar airspace opacities.  There is no consolidation or significant pleural effusion.  The lungs remain hyperinflated.  No acute osseous findings are seen.  IMPRESSION:  1.  New patchy left greater than right air space opacities suspicious for pneumonia.  Atypical edema is considered less likely. 2.  Underlying cardiomegaly and interstitial prominence are similar to priors.  Original Report Authenticated By: Gerrianne Scale, M.D.   Dg Chest 2 View  03/10/2011  *RADIOLOGY REPORT*  Clinical Data: Weakness, "weird" feeling, shakes, CHF, atrial fibrillation, COPD, hypertension, former smoker  CHEST - 2 VIEW  Comparison: 03/09/2010  Findings: Enlargement of cardiac silhouette. Tortuous aorta. Minimal pulmonary vascular congestion. Diffuse chronic interstitial prominence throughout both lungs, improved versus previous exam, question related to chronic failure. Underlying emphysematous changes. No segmental consolidation, pleural effusion or pneumothorax. Bones demineralized.  IMPRESSION: Enlargement of cardiac silhouette with pulmonary vascular congestion and question minimal chronic failure. Underlying emphysematous changes.  Original Report Authenticated By: Lollie Marrow, M.D.   Ct Head Wo Contrast  03/10/2011  *RADIOLOGY REPORT*  Clinical Data: Acute left-sided hearing loss. Left-sided weakness. Weakness and lethargy.  CT HEAD WITHOUT CONTRAST  Technique:  Contiguous axial images were obtained from the base of the skull through the vertex without contrast.  Comparison: CT head without  contrast 03/31/2009.  Findings: A remote right PICA territory infarct is present.  There is a remote lacunar infarct of the left thalamus.  A remote posterior watershed territory infarct on the left is stable.  Mild generalized atrophy is stable.  Mild periventricular white matter hypoattenuation is stable bilaterally as well.  The ventricles are proportionate to the degree of atrophy.  The paranasal sinuses and mastoid air cells are clear.  IMPRESSION:  1.  No acute intracranial abnormality or significant interval change. 2.  Stable remote infarcts involving the right PICA  territory, the left thalamus, and left posterior parietal watershed territory. 3.  Stable atrophy and mild white matter disease.  This likely reflects the sequelae of chronic microvascular ischemia.  Original Report Authenticated By: Jamesetta Orleans. MATTERN, M.D.    6:02 PM Lab workup shows bilateral pneumonia on chest x-ray. White blood count is elevated at 16,500 with 81% neutrophils. Lactic acid and protein acetone were negative. The patient has already received an injection of Rocephin.  I have called family practice to admit the patient to treat his health care associated pneumonia.   1. HCAP (healthcare-associated pneumonia)              Carleene Cooper III, MD 04/04/11 [phone]

## 2011-04-04 NOTE — ED Notes (Signed)
Patient denies pain and is resting comfortably.  

## 2011-04-04 NOTE — ED Notes (Signed)
Called and gave report to [place].

## 2011-04-04 NOTE — H&P (Addendum)
Family Medicine Teaching [facility] Admission History and Physical  Patient name: Andrew [name] Medical record number: [id number] Date of birth: 1931-03-21 Age: 77 y.o. Gender: male  Primary Care Provider: Dois Davenport., MD, MD  Chief Complaint:  Difficulty breathing and cough. History of Present Illness: Andrew [name] is a 77 y.o. male presenting with shortness of breath since last Wednesday. He uses O2 at night for the past 6 months and at this time he noticed that he needed to use it during the day and was SOB even with small exertion. He also complaints of productive cough with white non-bloody sputum. He has felt with an increase of temperature but after measuring was around 99, has had chills, and night sweats.No chest pain, no palpitations. Normal bowel movements but has had more difficulty with urination for the past week. He has taken over the counter medications with no relieve  and went to [facility] to get evaluated. Was diagnosed with PNA and was sent to [facility] for admission.  Pt was recently hospitalized (beginig of December/12) with a Stroke that resolved completely.  Patient Active Problem List  Diagnoses  . UNSPECIFIED ANEMIA  . COPD UNSPECIFIED  . GERD  . DYSPNEA  . Nonspecific (abnormal) findings on radiological and other examination of body structure  . LIVER FUNCTION TESTS, ABNORMAL, HX OF  . PERSONAL HISTORY OF COLONIC POLYPS  . ABNORMAL LUNG XRAY  . Ischemic heart disease  . Atrial flutter  . LVF (left ventricular failure)  . Hypertension  . Ischemic cardiomyopathy  . V-tach  . Hyperlipidemia  . CAD (coronary artery disease)   Past Medical History: Past Medical History  Diagnosis Date  . LVF (left ventricular failure)     chronic LV systolic failure; last echo in December of 2011 showed EF of 25%.   . Ischemic heart disease   . Atrial flutter     off amiodarone because of increased LFTs  . Shortness of breath     chronic  sob with known COPD and probable chronic bronchitis  . COPD (chronic obstructive pulmonary disease)   . Bronchitis, chronic   . Myocardial infarction 1994  . Gastro - esophageal reflux   . Claudication   . BPH (benign prostatic hyperplasia)   . CHF (congestive heart failure)   . Coronary artery disease     status post myocardial infarction in 1984,  99 and 07.  He has multiple RCA stents  . Hypertension   . Atrial fibrillation   . Hemorrhoids   . Hyperlipidemia   . Arrhythmia   . Hx of adenomatous colonic polyps   . Hx of colonoscopy    Past Surgical History: Past Surgical History  Procedure Date  . Cardiac catheterization 2008  . Cataract extraction   . Salivary gland surgery   . Coronary angioplasty with stent placement    Social History: History   Social History  . Marital Status: Married    Spouse Name: N/A    Number of Children: N/A  . Years of Education: N/A   Occupational History  . retired Designer, industrial/product work    Social History Main Topics  . Smoking status: Former Smoker -- 1.5 packs/day for 55 years    Quit date: 01/03/2009  . Smokeless tobacco: Never Used  . Alcohol Use: Yes     occasional  . Drug Use: No  . Sexually Active: None   Other Topics Concern  . None   Social History Narrative  .  None   Family History: Family History  Problem Relation Age of Onset  . Coronary artery disease Father   . Heart disease Mother   . Colon cancer Neg Hx   . Heart disease Brother    Allergies: Allergies  Allergen Reactions  . Codeine Nausea And Vomiting  . Sulfonamide Derivatives Itching and Rash   No current facility-administered medications for this encounter.   Medication Sig  . acetaminophen (TYLENOL) 500 MG tablet Take 1,000 mg by mouth every 12 (twelve) hours as needed. For pain.  [name] albuterol (2.5 MG/3ML) 0.083% NEBU 3 mL, albuterol (5 MG/ML) 0.5% NEBU 0.5 mL Inhale 2.5 mg into the lungs 3 (three) times daily as needed. For shortness of breath.    . Albuterol Sulfate (PROAIR HFA IN) Inhale 2 puffs into the lungs as needed. Shortness of breath.  . ALPRAZolam (XANAX) 0.25 MG tablet Take 0.25 mg by mouth at bedtime as needed. For anxiety.  [name] aspirin 81 MG tablet Take 81 mg by mouth daily.    . clopidogrel (PLAVIX) 75 MG tablet Take 75 mg by mouth every other day.    [name] Dextromethorphan-Guaifenesin (ROBITUSSIN COLD COUGH+ CHEST PO) Take 10 mLs by mouth 2 (two) times daily as needed. For cold and cough  . esomeprazole (NEXIUM) 40 MG capsule Take 40 mg by mouth 2 (two) times daily.   . Fluticasone-Salmeterol (ADVAIR) 250-50 MCG/DOSE AEPB Inhale 1 puff into the lungs every 12 (twelve) hours.   . furosemide (LASIX) 40 MG tablet Take 40-80 mg by mouth daily. 80 mg if weight >186  . HYDROcodone-acetaminophen (VICODIN) 5-500 MG per tablet Take 1 tablet by mouth every 12 (twelve) hours as needed. For pain.  [name] losartan (COZAAR) 100 MG tablet Take 100 mg by mouth daily.   . meclizine (ANTIVERT) 25 MG tablet Take 25 mg by mouth 3 (three) times daily as needed. for dizziness.  . metoprolol (TOPROL-XL) 50 MG 24 hr tablet Take 50 mg by mouth daily.    . pravastatin (PRAVACHOL) 20 MG tablet Take 20 mg by mouth every evening.    . Prenatal Vit-Fe Fumarate-FA (PRENATAL MULTIVITAMIN) TABS Take 1 tablet by mouth every evening.    . Rivaroxaban 20 MG TABS Take 20 mg by mouth daily with supper.    . Simethicone (GAS-X ULTRA STRENGTH PO) Take 2 tablets by mouth as needed. For indigestion  . Tamsulosin HCl (FLOMAX) 0.4 MG CAPS Take 0.4 mg by mouth 2 (two) times daily.   . Alum & Mag Hydroxide-Simeth (MAALOX MAXIMUM STRENGTH PO) Take 30 mLs by mouth daily as needed. For constipation  . calcium carbonate (TUMS EX) 750 MG chewable tablet Chew 3 tablets by mouth daily as needed. For upset stomach.  . magnesium hydroxide (MILK OF MAGNESIA) 400 MG/5ML suspension Take 45 mLs by mouth daily as needed. For constipation.  . nitroGLYCERIN (NITROSTAT) 0.4 MG SL tablet Place 1  tablet (0.4 mg total) under the tongue every 5 (five) minutes as needed for chest pain.  [name] DISCONTD: metoprolol tartrate (LOPRESSOR) 25 MG tablet Take 1/2 tablet every other day     Review Of Systems: Per HPI   Otherwise 12 point review of systems was performed and was unremarkable.  Physical Exam: Filed Vitals:   04/04/11 1656  BP: 125/58  Pulse: 90  Temp: 97.1  Resp: 22   Gen:  NAD HEENT: Mildly dry  mucous membranes CV: Regular rate and rhythm, no murmurs rubs or gallops PULM:  Diminish breath sounds  bilaterally. No wheezes,  there are rales un left base.  ABD: Soft, non tender, non distended, normal bowel sounds EXT: No edema Neuro: Alert and oriented x3. No focalization   Labs and Imaging: Lab Results  Component Value Date/Time   NA 136 04/04/2011  4:17 PM   K 3.7 04/04/2011  4:17 PM   CL 100 04/04/2011  4:17 PM   CO2 25 04/04/2011  4:17 PM   BUN 45* 04/04/2011  4:17 PM   CREATININE 1.68* 04/04/2011  4:17 PM   CREATININE 1.41 06/23/2010  5:44 PM   GLUCOSE 125* 04/04/2011  4:17 PM   Lab Results  Component Value Date   WBC 16.5* 04/04/2011   HGB 10.1* 04/04/2011   HCT 29.9* 04/04/2011   MCV 91.7 04/04/2011   PLT 246 04/04/2011    Assessment and Plan: Andrew [name] is a 77 y.o.  male admitted with productive cough and SOB and found to have bilateral infiltrates in CXR.   1. Respiratory: Hx of COPD smoker for 16 years(stopped smoking 2 years ago) Was recently hospitalized with the stroke less than a month ago. Has positive findings on physical exam , CXR with infiltrates and WBC 16.5. Found to have hypoxia 88% at RA on [place] 98%. Hospital acquire PNA is most likely: will treat with Avelox and bronchodilators. Close monitorig. 2.  Cardiovascular: Significant past medical history of cardiac vascular disease. EF of 15% on past ECHO 2 months ago. No chest pain. Normal cardiac exam. EKG on ED with new LBBB.  -cycling CE, EKG am. Cardiology consult -Will continue w/  current anticoagulation medications. 3. Hx of stroke: no sequelae. Physical exam negative. Secondary prevention with ASA.  2. FEN/GI: heart healthy diet. IV Lock 3. Prophylaxis: Rivaroxaban 4. Disposition: Pending improvement  D. [place] Rolene Arbour PGY-1 FMT[facility] Pager [phone]   I have seen the above patient with Dr. Aviva Signs and agree with the above physical exam and assessment and plan.  Briefly, this pt is a 77 y.o. Man with pmh of CHF and COPD who has had viral symptoms x 6 days that presented to [facility] today.  Found to have pneumonia.  Will treat with avelox for antibiotics.  Will treat with albuterol and atrovent q 4 hours prn.  Also creatinine elevated from baseline of 1 to 1.6.  Will monitor renal function closely.  Since h/o CHF and last echo showed ef of 15% will have to rehydrate carefully.  Pt states he is very thirsty and asking for po fluids.  At this time will give po liquids to treat volume deficit since this will prevent possible overhydration that often occurs with IV rehydration.  Will monitor lung and cardiac exam closely.    Betsy Coder, MD PGY-3

## 2011-04-04 NOTE — ED Notes (Signed)
Patient was seen at urgent care today.  Patient with reported Illness since Christmas.  He states he has had a cough and pain in his legs. Patient has increased cough when lying down in bed.  Patient reports temp of 99.  Patient refused to come by [facility],  [facility]

## 2011-04-04 NOTE — ED Notes (Signed)
MD at bedside. 

## 2011-04-04 NOTE — H&P (Signed)
I interviewed and examined this patient and discussed the care plan with Dr. Aviva Signs and the [facility] team and agree with assessment and plan as documented in the admission note for today.    Amylynn Fano A. Sheffield Slider, MD Family Medicine Teaching Service Attending  04/04/2011 9:27 PM

## 2011-04-05 ENCOUNTER — Other Ambulatory Visit: Payer: Self-pay

## 2011-04-05 LAB — COMPREHENSIVE METABOLIC PANEL
BUN: 42 mg/dL — ABNORMAL HIGH (ref 6–23)
CO2: 25 mEq/L (ref 19–32)
Calcium: 8.7 mg/dL (ref 8.4–10.5)
Chloride: 103 mEq/L (ref 96–112)
Creatinine, Ser: 1.51 mg/dL — ABNORMAL HIGH (ref 0.50–1.35)
GFR calc Af Amer: 49 mL/min — ABNORMAL LOW (ref >90)
GFR calc non Af Amer: 42 mL/min — ABNORMAL LOW (ref >90)
Glucose, Bld: 97 mg/dL (ref 70–99)
Total Bilirubin: 0.6 mg/dL (ref 0.3–1.2)

## 2011-04-05 LAB — CARDIAC PANEL(CRET KIN+CKTOT+MB+TROPI)
CK, MB: 3 ng/mL (ref 0.3–4.0)
CK, MB: 2.8 ng/mL (ref 0.3–4.0)
Total CK: 81 U/L (ref 7–232)
Troponin I: <0.3 ng/mL (ref <0.30)

## 2011-04-05 MED ORDER — MOXIFLOXACIN HCL 400 MG PO TABS
400.0000 mg | ORAL_TABLET | Freq: Once | ORAL | Status: AC
  Administered 2011-04-05: 400 mg via ORAL
  Filled 2011-04-05: qty 1

## 2011-04-05 MED ORDER — HYDROCODONE-ACETAMINOPHEN 5-325 MG PO TABS
1.0000 | ORAL_TABLET | Freq: Four times a day (QID) | ORAL | Status: DC | PRN
  Administered 2011-04-05 – 2011-04-08 (×4): 1 via ORAL
  Filled 2011-04-05 (×4): qty 1

## 2011-04-05 NOTE — Progress Notes (Signed)
PGY-1 Daily Progress Note Family Medicine Teaching Service D. Piloto Rolene Arbour, MD Service Pager: [phone]  Patient name: Andrew [name]  Medical record number:[id number] Date of birth:[birth year] Age: 77 y.o. Gender: male  LOS: 1 day   Subjective: feeling better. No changes in his WOB. No hypoxia. On 2 L. afebrile  Objective:  Vitals: Temp:  98.8 F  Pulse Rate:   97   Resp:  20   BP:  102/56 mmHg  SpO2:   98 % (12/31 1200) Weight:  [179 lb 3.7 oz (81.3 kg)-186 lb (84.369 kg)] 182 lb 15.7 oz (83 kg) (12/31 0500)  Intake/Output Summary (Last 24 hours) at 04/05/11 1423 Last data filed at 04/05/11 1100  Gross per 24 hour  Intake    243 ml  Output    450 ml  Net   -207 ml   Physical Exam: Gen: NAD  HEENT: Mildly dry mucous membranes  CV: Regular rate and rhythm, no murmurs rubs or gallops  PULM: Diminish breath sounds bilaterally. No wheezes, there are mild rales un left base.  ABD: Soft, non tender, non distended, normal bowel sounds  EXT: No edema  Neuro: Alert and oriented x3. No focalization   Labs and imaging:  CBC  Lab 04/04/11 2110 04/04/11 1617  WBC 15.5* 16.5*  HGB 9.6* 10.1*  HCT 28.6* 29.9*  PLT 254 246   BMET  Lab 04/05/11 0500 04/04/11 2110 04/04/11 1617  NA 141 -- 136  K 3.4* -- 3.7  CL 103 -- 100  CO2 25 -- 25  BUN 42* -- 45*  CREATININE 1.51* 1.62* 1.68*  LABGLOM -- -- --  GLUCOSE 97 -- --  CALCIUM 8.7 -- 8.9   Results for orders placed during the hospital encounter of 04/04/11 (from the past 24 hour(s))  LACTIC ACID, PLASMA     Status: Normal   Collection Time   04/04/11  4:16 PM      Component Value Range   Lactic Acid, Venous 1.7  0.5 - 2.2 (mmol/L)  PROCALCITONIN     Status: Normal   Collection Time   04/04/11  4:16 PM      Component Value Range   Procalcitonin 0.91    CULTURE, BLOOD (ROUTINE X 2)     Status: Normal (Preliminary result)   Value:        BLOOD CULTURE RECEIVED NO GROWTH TO DATE CULTURE WILL BE HELD FOR 5 DAYS BEFORE  ISSUING A FINAL NEGATIVE REPORT   Report Status PENDING    PROTIME-INR     Status: Abnormal   Collection Time   04/04/11  4:17 PM      Component Value Range   Prothrombin Time 19.8 (*) 11.6 - 15.2 (seconds)   INR 1.65 (*) 0.00 - 1.49    GFR calc Af Amer 43 (*) >90 (mL/min)  CULTURE, BLOOD (ROUTINE X 2)     Status: Normal (Preliminary result)   Value:        BLOOD CULTURE RECEIVED NO GROWTH TO DATE CULTURE WILL BE HELD FOR 5 DAYS BEFORE ISSUING A FINAL NEGATIVE REPORT   Report Status PENDING    CARDIAC PANEL(CRET KIN+CKTOT+MB+TROPI)     Status: Normal   Collection Time   04/04/11  9:10 PM      Component Value Range   Total CK 122  7 - 232 (U/L)   CK, MB 3.1  0.3 - 4.0 (ng/mL)   Troponin I <0.30  <0.30 (ng/mL)   Relative Index 2.5  0.0 - 2.5   CARDIAC PANEL(CRET KIN+CKTOT+MB+TROPI)     Status: Abnormal   Collection Time   04/05/11  5:01 AM      Component Value Range   Total CK 100  7 - 232 (U/L)   CK, MB 3.0  0.3 - 4.0 (ng/mL)   Troponin I <0.30  <0.30 (ng/mL)   Relative Index 3.0 (*) 0.0 - 2.5   CARDIAC PANEL(CRET KIN+CKTOT+MB+TROPI)     Status: Normal   Collection Time   04/05/11 12:55 PM      Component Value Range   Total CK 81  7 - 232 (U/L)   CK, MB 2.8  0.3 - 4.0 (ng/mL)   Troponin I <0.30  <0.30 (ng/mL)   Relative Index RELATIVE INDEX IS INVALID  0.0 - 2.5    Dg Chest 2 View 04/04/2011   IMPRESSION:  1.  New patchy left greater than right air space opacities suspicious for pneumonia.  Atypical edema is considered less likely. 2.  Underlying cardiomegaly and interstitial prominence are similar to priors.  Original Report Authenticated By: Gerrianne Scale, M.D.   Medications: Medication Dose Route Frequency  . 0.9 %  sodium chloride infusion  250 mL Intravenous PRN  . acetaminophen (TYLENOL) tablet 1,000 mg  1,000 mg Oral Q12H PRN  . albuterol (PROVENTIL) (5 MG/ML) 0.5% nebulizer solution 2.5 mg  2.5 mg Nebulization Q4H  . aspirin chewable tablet 81 mg  81 mg Oral  Daily  . clopidogrel (PLAVIX) tablet 75 mg  75 mg Oral QODAY  . Fluticasone-Salmeterol (ADVAIR) 250-50 MCG/DOSE inhaler 1 puff  1 puff Inhalation Q12H  . ipratropium (ATROVENT) nebulizer solution 0.5 mg  0.5 mg Nebulization Q6H PRN  . metoprolol (TOPROL-XL) 24 hr tablet 50 mg  50 mg Oral Daily  . moxifloxacin (AVELOX) tablet 400 mg  400 mg Oral q1800  . moxifloxacin (AVELOX) tablet 400 mg  400 mg Oral Once  . pantoprazole (PROTONIX) EC tablet 40 mg  40 mg Oral Daily  . Rivaroxaban (XARELTO) tablet TABS 15 mg  15 mg Oral Q supper  . simvastatin (ZOCOR) tablet 10 mg  10 mg Oral q1800  . Tamsulosin HCl (FLOMAX) capsule 0.4 mg  0.4 mg Oral BID   Assessment and Plan: HOSEY BURMESTER is a 77 y.o. male admitted with productive cough and SOB and found to have bilateral patchy infiltrates in CXR and elevated WBC  1. Respiratory: Condition stable. Afebrile. BCx negatives. WBC trending down. Continue on Avelox (low threshold for abx IV is warrant) 2. Cardiovascular: Significant past medical history of cardiac vascular disease. EF of 15% on past ECHO 2 months ago. No chest pain. Normal cardiac exam. EKG on ED with new LBBB.  Cardiac Enzymes negative., EKG am. Cardiology consult as outpatient if continues stable -monitor I/O's  -Will continue w/ current anticoagulation medications.  3. Hx of stroke: no sequelae. Physical exam negative. Secondary prevention with ASA.   FEN/GI: heart healthy diet. IV Lock Prophylaxis: Rivaroxaban Disposition: Pending improvement  D. Piloto Rolene Arbour, MD PGY1, Family Medicine Teaching Service Pager [phone] 04/05/2011

## 2011-04-05 NOTE — Progress Notes (Signed)
   CARE MANAGEMENT NOTE HEART FAILURE  04/05/2011   Patient:  Andrew [name], Andrew [name]   Account Number:  [id number]    Date Initiated:  04/05/2011  Documentation initiated by:  Shannan Harper  Subjective/Objective Assessment:   Patient admitted after a several day hx of SOB.  Using oxygen at home at night, requiring it during the day.  Went to Urgent Care and then transferred to [facility] due to PNA. Hx of CHF.   Action/Plan:   Discharge back to home with spouse.   Anticipated DC Date:  04/06/2011  Anticipated DC Plan:  HOME/SELF CARE  DC Planning Services:  CM consult    Choice offered to / List presented to:   DME arranged:  [facility]     DME agency:  [facility]ager       Status of service:  Completed, signed off  Medicare Important Message Given:   (If response is "NO", the following Medicare IM given date fields will be blank) Date Medicare IM Given:   Date Additional Medicare IM Given:    Discharge Disposition:  HOME/SELF CARE  Per UR Regulation:  Reviewed for med. necessity/level of care/duration of stay  Comments:   Met with patient and daughter Liborio Nixon.  Discussed HH and patient refused services.  Stated he is comfortable with a [employer] product that checks in on him.  He did ask if I could notify [facility] that he is in the hospital as they were to come out to his home yesterday to switch out equipment.  I spoke with Amparo Bristol and they will call the patient on Wednesday to make an appt with him once he has returned home.  I called back to patient's room to notify him of the [facility] communication.  Heart Failure Packet was also provided by the NCM.  Weight discussed, patient has a working scale at home and weighs daily.  Spoke in regards to salt intake and zones.  Will continue to assist as needed. 04/05/11 1646 Shannan Harper, RN, BSN  UR Initial Review Completed. 04/05/11 1427 Shannan Harper, RN, BSN   Initial CM contact:  04/05/2011 12:00 M  By:  Shannan Harper Initial CSW contact:     By:      Is this an INP Readmission < 30 days:  Y (If "YES" please see readmission information at the bottom of note)  Patient living status prior to this admission:  FAMILY  Patient setting prior to this admission:  HOME  Comorbid conditions being treated that contributed to this admission:  HIGH CHF, COPD, Aflutter, LVF disfunction  CHF Readmission Risk:  high  Type of patient education provided  HF Patient Education Assessment / Teach Back  HF Zone Tool / Magnet  Limit salt intake  Weigh daily     Patient education provided by  [facility]    Was referral made to [facility]:  N  Is the patient's PCP the same as attending:  N PCP:  RICHTER,KAREN L.  Readmission < 30 Days If pt has HH, did they contact the agency before going to the ED:   Name of [facility] agency:    Was the follow-up physician visit scheduled prior to discharge:  Y  Did the patient follow-up with the physician prior to this readmission:  Y  Was there HF Clinic visits prior to readmission:    Were there ED visits between admissions:  N  Readmit type:  Unscheduled/Unrelated  If unscheduled and related indicate reason for readmit:

## 2011-04-05 NOTE — H&P (Signed)
I interviewed and examined this patient and discussed the care plan with Dr. Marcha Dutton and the [facility] team and agree with assessment and plan as documented in the admission note for today. I don't consider this a hospital acquired pneumonia, and agree with the antibiotic choice.     Imajean Mcdermid A. Sheffield Slider, MD Family Medicine Teaching Service Attending  04/05/2011 11:20 AM

## 2011-04-05 NOTE — Progress Notes (Signed)
UR Completed.  Judianne Seiple Norris 04/05/2011 [phone]  

## 2011-04-06 DIAGNOSIS — R0602 Shortness of breath: Secondary | ICD-10-CM

## 2011-04-06 DIAGNOSIS — N289 Disorder of kidney and ureter, unspecified: Secondary | ICD-10-CM

## 2011-04-06 DIAGNOSIS — I4891 Unspecified atrial fibrillation: Secondary | ICD-10-CM

## 2011-04-06 LAB — URINE CULTURE: Culture  Setup Time: 201212311045

## 2011-04-06 LAB — CBC
MCH: 30.5 pg (ref 26.0–34.0)
MCHC: 33 g/dL (ref 30.0–36.0)
Platelets: 294 10*3/uL (ref 150–400)
RBC: 3.15 MIL/uL — ABNORMAL LOW (ref 4.22–5.81)

## 2011-04-06 LAB — BASIC METABOLIC PANEL
Calcium: 8.8 mg/dL (ref 8.4–10.5)
GFR calc Af Amer: 52 mL/min — ABNORMAL LOW (ref >90)
GFR calc non Af Amer: 45 mL/min — ABNORMAL LOW (ref >90)
Glucose, Bld: 124 mg/dL — ABNORMAL HIGH (ref 70–99)
Potassium: 3.8 mEq/L (ref 3.5–5.1)
Sodium: 142 mEq/L (ref 135–145)

## 2011-04-06 MED ORDER — FUROSEMIDE 10 MG/ML IJ SOLN
40.0000 mg | Freq: Every day | INTRAMUSCULAR | Status: DC
  Administered 2011-04-06 – 2011-04-08 (×3): 40 mg via INTRAVENOUS
  Filled 2011-04-06 (×3): qty 4

## 2011-04-06 MED ORDER — ALUM & MAG HYDROXIDE-SIMETH 200-200-20 MG/5ML PO SUSP
30.0000 mL | ORAL | Status: DC | PRN
  Administered 2011-04-07: 30 mL via ORAL
  Filled 2011-04-06: qty 30

## 2011-04-06 MED ORDER — HYDRALAZINE HCL 10 MG PO TABS
10.0000 mg | ORAL_TABLET | Freq: Four times a day (QID) | ORAL | Status: DC
  Administered 2011-04-06 – 2011-04-08 (×7): 10 mg via ORAL
  Filled 2011-04-06 (×12): qty 1

## 2011-04-06 NOTE — Consult Note (Signed)
Clinical Summary Andrew [name] is a 78 y.o.male, patient of Dr. Antoine Poche, presently admitted to the hospital with history of productive cough and shortness of breath over the last few weeks. Can piece his own supple no oxygen at home, reports compliance with his regular cardiac medications. Has not experienced any angina or palpitations, no syncope. He is presently being treated for probable pneumonia.  He has a history of CAD and cardiomyopathy, LVEF estimated at 15% with diffuse hypokinesis and inferoposterior and lateral akinesis by echocardiogram in November. He was actually scheduled to see Dr. Graciela Husbands in late December for discussion of ICD, however had to reschedule related to his illness.  We are consulted to assist with management of his cardiomyopathy. Review of recent lab work finds generally improving creatinine from 1.6 down to 1.4, normal troponin levels, anemia with hemoglobin of 9.6, INR 1.6. Chest x-ray shows left greater than right airspace opacities concerning for infiltrate, however may also represent asymmetric edema.  He is noted to have a left bundle branch block on ECG, old in comparison to tracings from earlier in December. His telemetry has shown episodic atrial fibrillation with RVR. Also ventricular ectopy. He has a known history of atrial flutter however did not tolerate amiodarone related to abnormal LFTs. He has not been on Coumadin recently. Record review finds history of stroke back in early December at which time he was placed on Xarelto.  In review of his medications, Cozaar was held at admission. He has not tolerated advances in beta blocker dosing based on Dr. Jenene Slicker most recent note.   Allergies  Allergen Reactions  . Codeine Nausea And Vomiting  . Sulfonamide Derivatives Itching and Rash    Medications    . aspirin  81 mg Oral Daily  . clopidogrel  75 mg Oral QODAY  . Fluticasone-Salmeterol  1 puff Inhalation Q12H  . metoprolol  50 mg Oral Daily  .  moxifloxacin  400 mg Oral q1800  . pantoprazole  40 mg Oral Daily  . rivaroxaban  15 mg Oral Q supper  . simvastatin  10 mg Oral q1800  . sodium chloride  3 mL Intravenous Q12H  . Tamsulosin HCl  0.4 mg Oral BID  . DISCONTD: albuterol  2.5 mg Nebulization Q4H    Past Medical History  Diagnosis Date  . LVF (left ventricular failure)     chronic LV systolic failure; last echo in December of 2011 showed EF of 25%.   . Ischemic heart disease   . Atrial flutter     off amiodarone because of increased LFTs  . Shortness of breath     chronic sob with known COPD and probable chronic bronchitis  . COPD (chronic obstructive pulmonary disease)   . Bronchitis, chronic   . Myocardial infarction 1994  . Gastro - esophageal reflux   . Claudication   . BPH (benign prostatic hyperplasia)   . CHF (congestive heart failure)   . Coronary artery disease     status post myocardial infarction in 1984,  99 and 07.  He has multiple RCA stents  . Hypertension   . Campath-induced atrial fibrillation   . Hemorrhoids   . Hyperlipidemia   . Arrhythmia   . Hx of adenomatous colonic polyps   . Hx of colonoscopy     Past Surgical History  Procedure Date  . Cardiac catheterization 2008  . Cataract extraction   . Salivary gland surgery   . Coronary angioplasty with stent placement     Family History  Problem Relation Age of Onset  . Coronary artery disease Father   . Heart disease Mother   . Colon cancer Neg Hx   . Heart disease Brother     Social History Andrew [name] reports that he quit smoking about 2 years ago. He has never used smokeless tobacco. Andrew [name] reports that he drinks alcohol.  Review of Systems No palpitations or syncope. No progressive lower extremity edema. Appetite has been somewhat limited. No abdominal bloating. Does report subjective fevers over the last few weeks, productive cough. Some orthopnea, no PND. No recent angina. No reported melena or hematochezia.  Physical  Examination Temp:  [98.1 F (36.7 C)-98.4 F (36.9 C)] 98.4 F (36.9 C) (01/01 0500) Pulse Rate:  [88-103] 103  (01/01 0912) Resp:  [18-22] 18  (01/01 0500) BP: (133-149)/(61-78) 149/71 mmHg (01/01 0912) SpO2:  [91 %-98 %] 91 % (01/01 1020) Weight:  [184 lb 4.9 oz (83.6 kg)] 184 lb 4.9 oz (83.6 kg) (01/01 0500)  I/O last 3 completed shifts: In: 246 [P.O.:240; I.V.:6] Out: 750 [Urine:750]  Chronically ill-appearing male in no acute distress. HEENT: Conjunctiva and lids normal, oropharynx clear with moist mucosa. Neck: Supple, JVP increased, no carotid bruits, no thyromegaly. Lungs: Rhonchorous with scattered crackles, no wheezing, nonlabored breathing at rest. Cardiac: Regular rate and rhythm, no obvious S3, soft basal systolic murmur, no pericardial rub. Abdomen: Soft, nontender, bowel sounds present, no guarding or rebound. Extremities: Trace to 1+ ankle edema, distal pulses 1+, venous stasis. Skin: Warm and dry. Musculoskeletal: No kyphosis. Neuropsychiatric: Alert and oriented x3, affect grossly appropriate.  Testing Lab Results  Component Value Date   WBC 15.6* 04/06/2011   HGB 9.6* 04/06/2011   HCT 29.1* 04/06/2011   MCV 92.4 04/06/2011   PLT 294 04/06/2011    Lab Results  Component Value Date   CREATININE 1.45* 04/06/2011   BUN 36* 04/06/2011   NA 142 04/06/2011   K 3.8 04/06/2011   CL 106 04/06/2011   CO2 25 04/06/2011    Lab Results  Component Value Date   ALT 26 04/05/2011   AST 44* 04/05/2011   ALKPHOS 93 04/05/2011   BILITOT 0.6 04/05/2011    Lab Results  Component Value Date   LDLCALC 86 03/11/2011    Lab Results  Component Value Date   CKTOTAL 81 04/05/2011   CKMB 2.8 04/05/2011   TROPONINI <0.30 04/05/2011   Echocardiogram 11/12: - Left ventricle: The cavity size was mildly dilated. Wall thickness was increased in a pattern of mild LVH. The estimated ejection fraction was 15%. Diffuse hypokinesis. There is akinesis of the posterolateral myocardium.  There is akinesis of the inferior myocardium. Doppler parameters are consistent with abnormal left ventricular relaxation (grade 1 diastolic dysfunction). - Left atrium: The atrium was mildly dilated. - Pericardium, extracardiac: A trivial pericardial effusion was identified.   Impression  1. Probable component of acute on chronic systolic heart failure in the setting of pneumonia.  2. Cardiomyopathy, LVEF approximately 15% by most recent assessment. Not certain if purely ischemic, or perhaps combined with reduced LVEF over time. Medical therapy has been somewhat limited.   3. Acute renal insufficiency, creatinine 1.6 at presentation, down to 1.4. Cozaar has been held.  4. Left bundle branch block by ECG.  5. Apparent paroxysmal atrial fibrillation by recent telemetry. Has history of atrial flutter and intolerance to amiodarone related to abnormal LFTs. He has been on Xarelto since early December with history of stroke..  6. CAD with prior history of  inferior infarct.  7. Hypertension, blood pressure has been up.  8. Stroke in December.  Recommendations  Patient being transferred to the step down unit by the primary team, continues on empiric antibiotics. Current medications include aspirin, Plavix, Toprol-XL, Zocor, Xarelto. Would add IV Lasix, also Hydralazine for now. Check LFT's. Would consider having a PICC line placed once in stepdown so that can obtain co-ox measurements that might better guide need for inotropes. Can also have EP see him while in hospital, although not clear that now is a good time for ICD in light of suspected pneumonia. Would be good to get EP opinion on PAF/flutter as well. He has history of prior DES but not in the last year - not clear that he should stay on ASA/Plavix/Xarelto long term.  Jonelle Sidle, M.D., F.A.C.C.

## 2011-04-06 NOTE — Progress Notes (Signed)
I have seen and examined this patient. I have discussed with Dr Rivka Safer.  I agree with their findings and plans as documented in their progress note for today.  Acute Issues  Pneumonia -Purulent sputum, second sickening history, pre-existing chronic interstitial lung changes from tobacco smoking.  -Symptomatically slightly better this afternoon compared to this morning. CXR on admission with patchy opacities bilaterally consistent with bronchopneumonia.  -On Avelox Day #2, tolerating it.   Question of acute decompensated heart failure in patient with severely reduced LV EF%. - Creatinine up to 1.45 from 1.0 on 02/11/11. - Question of pre-renal vs cardiorenal from low cardiac output.   Plan:  Continue Avelox, Urine for strep and legionella antigens. Sputum for gram stain and culture Consult Cardiology for opinion on congestive component to acute illness and appropriate interventions for same if present.

## 2011-04-06 NOTE — Progress Notes (Signed)
Family Medicine Daily Progress Note  Patient name: Andrew [name]  Medical record number:[id number] Date of birth:[birth year] Age: 78 y.o. Gender: male  LOS: 2 days   Subjective: patient is uncomfortable, breathing shallow and fast, tangential thinking. Admits to being "scatter brained with thoughts" appears anxious. Does not feel improved.  Objective:  Vitals: Filed Vitals:   04/06/11 0107 04/06/11 0438 04/06/11 0500 04/06/11 0912  BP:   146/78 149/71  Pulse:   97 103  Temp:   98.4 F (36.9 C)   TempSrc:      Resp:   18   Height:      Weight:   184 lb 4.9 oz (83.6 kg)   SpO2: 98% 98% 97%      Intake/Output Summary (Last 24 hours) at 04/06/11 1010 Last data filed at 04/06/11 0600  Gross per 24 hour  Intake      6 ml  Output    500 ml  Net   -494 ml   Physical Exam: Gen: tac hypnic, confused, scattered thoughts  HEENT: Dry mucous membranes  CV: tachycardic, regular rhythm, non tender chest wall, no murmur.  PULM: bilateral ronchi, otherwise clear. ABD: Soft, non tender, mildly distended, normal bowel sounds  EXT: No edema  Neuro: Alert and oriented x3. Seems altered. No gross defects.  Labs and imaging:  CBC  Lab 04/06/11 0555 04/04/11 2110 04/04/11 1617  WBC 15.6* 15.5* 16.5*  HGB 9.6* 9.6* 10.1*  HCT 29.1* 28.6* 29.9*  PLT 294 254 246   BMET  Lab 04/06/11 0555 04/05/11 0500 04/04/11 2110 04/04/11 1617  NA 142 141 -- 136  K 3.8 3.4* -- 3.7  CL 106 103 -- 100  CO2 25 25 -- 25  BUN 36* 42* -- 45*  CREATININE 1.45* 1.51* 1.62* --  LABGLOM -- -- -- --  GLUCOSE 124* -- -- --  CALCIUM 8.8 8.7 -- 8.9   Results for orders placed during the hospital encounter of 04/04/11 (from the past 24 hour(s))  LACTIC ACID, PLASMA     Status: Normal   Collection Time   04/04/11  4:16 PM      Component Value Range   Lactic Acid, Venous 1.7  0.5 - 2.2 (mmol/L)  PROCALCITONIN     Status: Normal   Collection Time   04/04/11  4:16 PM      Component Value Range   Procalcitonin 0.91    CULTURE, BLOOD (ROUTINE X 2)     Status: Normal (Preliminary result)   Value:        BLOOD CULTURE RECEIVED NO GROWTH TO DATE CULTURE WILL BE HELD FOR 5 DAYS BEFORE ISSUING A FINAL NEGATIVE REPORT   Report Status PENDING    PROTIME-INR     Status: Abnormal   Collection Time   04/04/11  4:17 PM      Component Value Range   Prothrombin Time 19.8 (*) 11.6 - 15.2 (seconds)   INR 1.65 (*) 0.00 - 1.49    GFR calc Af Amer 43 (*) >90 (mL/min)  CULTURE, BLOOD (ROUTINE X 2)     Status: Normal (Preliminary result)   Value:        BLOOD CULTURE RECEIVED NO GROWTH TO DATE CULTURE WILL BE HELD FOR 5 DAYS BEFORE ISSUING A FINAL NEGATIVE REPORT   Report Status PENDING    CARDIAC PANEL(CRET KIN+CKTOT+MB+TROPI)     Status: Normal   Collection Time   04/04/11  9:10 PM      Component Value Range  Total CK 122  7 - 232 (U/L)   CK, MB 3.1  0.3 - 4.0 (ng/mL)   Troponin I <0.30  <0.30 (ng/mL)   Relative Index 2.5  0.0 - 2.5   CARDIAC PANEL(CRET KIN+CKTOT+MB+TROPI)     Status: Abnormal   Collection Time   04/05/11  5:01 AM      Component Value Range   Total CK 100  7 - 232 (U/L)   CK, MB 3.0  0.3 - 4.0 (ng/mL)   Troponin I <0.30  <0.30 (ng/mL)   Relative Index 3.0 (*) 0.0 - 2.5   CARDIAC PANEL(CRET KIN+CKTOT+MB+TROPI)     Status: Normal   Collection Time   04/05/11 12:55 PM      Component Value Range   Total CK 81  7 - 232 (U/L)   CK, MB 2.8  0.3 - 4.0 (ng/mL)   Troponin I <0.30  <0.30 (ng/mL)   Relative Index RELATIVE INDEX IS INVALID  0.0 - 2.5    Dg Chest 2 View 04/04/2011   IMPRESSION:  1.  New patchy left greater than right air space opacities suspicious for pneumonia.  Atypical edema is considered less likely. 2.  Underlying cardiomegaly and interstitial prominence are similar to priors.  Original Report Authenticated By: Gerrianne Scale, M.D.   Medications: Current facility-administered medications:0.9 %  sodium chloride infusion, 250 mL, Intravenous, PRN, Dawn  Caviness;  aspirin chewable tablet 81 mg, 81 mg, Oral, Daily, [address], [place], 81 mg at 04/06/11 1610;  clopidogrel (PLAVIX) tablet 75 mg, 75 mg, Oral, QODAY, Dawn Caviness, 75 mg at 04/05/11 1027 Fluticasone-Salmeterol (ADVAIR) 250-50 MCG/DOSE inhaler 1 puff, 1 puff, Inhalation, Q12H, Dawn Caviness, 1 puff at 04/05/11 2110;  HYDROcodone-acetaminophen (NORCO) 5-325 MG per tablet 1 tablet, 1 tablet, Oral, Q6H PRN, Shelly Flatten, MD, 1 tablet at 04/05/11 2258;  ipratropium (ATROVENT) nebulizer solution 0.5 mg, 0.5 mg, Nebulization, Q6H PRN, Dawn Caviness, 0.5 mg at 04/06/11 0105 metoprolol (TOPROL-XL) 24 hr tablet 50 mg, 50 mg, Oral, Daily, Dawn Caviness, 50 mg at 04/06/11 0912;  moxifloxacin (AVELOX) tablet 400 mg, 400 mg, Oral, q1800, Dawn Caviness, 400 mg at 04/05/11 1727;  pantoprazole (PROTONIX) EC tablet 40 mg, 40 mg, Oral, Daily, Dawn Caviness, 40 mg at 04/06/11 0912;  Rivaroxaban (XARELTO) tablet TABS 15 mg, 15 mg, Oral, Q supper, Juliette [address] PHARMD, 15 mg at 04/05/11 1727 simvastatin (ZOCOR) tablet 10 mg, 10 mg, Oral, q1800, Dawn Caviness, 10 mg at 04/05/11 1727;  sodium chloride 0.9 % injection 3 mL, 3 mL, Intravenous, Q12H, Dawn Caviness, 3 mL at 04/06/11 0915;  sodium chloride 0.9 % injection 3 mL, 3 mL, Intravenous, PRN, Dawn Caviness;  Tamsulosin HCl (FLOMAX) capsule 0.4 mg, 0.4 mg, Oral, BID, Dawn Caviness, 0.4 mg at 04/06/11 9604 DISCONTD: acetaminophen (TYLENOL) tablet 1,000 mg, 1,000 mg, Oral, Q12H PRN, Ellin Mayhew;  DISCONTD: albuterol (PROVENTIL) (5 MG/ML) 0.5% nebulizer solution 2.5 mg, 2.5 mg, Nebulization, Q4H, Dawn Caviness, 2.5 mg at 04/06/11 0431  Assessment and Plan: Andrew [name] is a 78 y.o. C/m admitted with presumed pneumonia bilaterally. 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 He presents with worsening fatigue/sob/new LBBB.  1. SOB Bilateral viral/bacterial/atypical  pneumonia vs. COPD exacerbation vs. Pulmonary Edema/CHF exacerbation. - c/w avelox covering bacterial/atypical pathogens - c/w O2 and nebs for viral pneumonia/COPD - consulted cardiology for new LBBB/CHF worsening SOB possible cardiac etiology of current illness.  2. Dryness -low  UOP with concentrated urine and very dry MM - 2nd to poor PO intake  - debating whether to hydrate with IVF given EF of 15%.  Does have AKI  3. AKI CREATININE: 1.45 mg/dL ABNORMAL (45/40/98 1191) Estimated creatinine clearance - Cockcroft-Gault CrCl: 46.1 mL/min 2nd to ATN related to CHF exacerbation? Poor oral intake? Will await cardiology recs and possibly give some fluids IV.  FEN/GI: heart healthy diet. IV Lock Prophylaxis: anticoagulated. Disposition: Transfer to cardiac stepdown for closer monitoring with worsening SOB/tachycardia.  Edd Arbour MD Pager [phone] 04/06/2011 10:10 AM

## 2011-04-06 NOTE — Progress Notes (Signed)
Pt had 27 beats of Vtach at 0135. Pt denied chest pain at this time and is asymptomatic. Vital signs are stable. MD with Family Medicine notified. Will continue to monitor patient.

## 2011-04-07 DIAGNOSIS — I4891 Unspecified atrial fibrillation: Secondary | ICD-10-CM

## 2011-04-07 DIAGNOSIS — I5023 Acute on chronic systolic (congestive) heart failure: Secondary | ICD-10-CM

## 2011-04-07 DIAGNOSIS — N289 Disorder of kidney and ureter, unspecified: Secondary | ICD-10-CM

## 2011-04-07 DIAGNOSIS — I2510 Atherosclerotic heart disease of native coronary artery without angina pectoris: Secondary | ICD-10-CM

## 2011-04-07 DIAGNOSIS — I2589 Other forms of chronic ischemic heart disease: Secondary | ICD-10-CM

## 2011-04-07 LAB — HEPATIC FUNCTION PANEL
ALT: 150 U/L — ABNORMAL HIGH (ref 0–53)
AST: 149 U/L — ABNORMAL HIGH (ref 0–37)
Albumin: 2.3 g/dL — ABNORMAL LOW (ref 3.5–5.2)
Alkaline Phosphatase: 116 U/L (ref 39–117)
Total Bilirubin: 0.6 mg/dL (ref 0.3–1.2)

## 2011-04-07 LAB — LEGIONELLA ANTIGEN, URINE

## 2011-04-07 MED ORDER — CARVEDILOL 12.5 MG PO TABS
12.5000 mg | ORAL_TABLET | Freq: Two times a day (BID) | ORAL | Status: DC
  Administered 2011-04-08: 12.5 mg via ORAL
  Filled 2011-04-07 (×3): qty 1

## 2011-04-07 NOTE — Progress Notes (Signed)
Subjective: Patient states that he is feeling better today overall. Patient though still gets extremely short of breath with any activity such as going to the restroom. Patient though feels that he does need to start getting up and trying to get his strength back. Patient does not feel like he could go home at this point alone. Patient though feels like his breathing has gotten better but he still needs oxygen.  Objective: Vital signs in last 24 hours: Temp:  [97.3 F (36.3 C)-98.7 F (37.1 C)] 98.6 F (37 C) (01/02 0800) Pulse Rate:  [80-97] 80  (01/02 0800) Resp:  [22-34] 28  (01/02 0800) BP: (121-142)/(54-71) 121/54 mmHg (01/02 0800) SpO2:  [93 %-99 %] 98 % (01/02 0809) Weight:  [83.462 kg (184 lb)-84.3 kg (185 lb 13.6 oz)] 185 lb 13.6 oz (84.3 kg) (01/02 0329) Weight change: -0.138 kg (-4.9 oz) Last BM Date: 04/06/11  Intake/Output from previous day: 01/01 0701 - 01/02 0700 In: 243 [P.O.:240; I.V.:3] Out: 1420 [Urine:1420] Intake/Output this shift: Total I/O In: 360 [P.O.:360] Out: 200 [Urine:200]  Gen: Tachypneic, patient's mentation is much better than yesterday. Patient still mildly short of breath even with speaking. HEENT: Moist mucous membranes CV: tachycardic, regular rhythm but does have PVCs from time to time, non tender chest wall, no murmur.  PULM: bilateral ronchi, no focal findings  ABD: Soft, non tender, mildly distended, normal bowel sounds  EXT: No edema  Neuro: Alert and oriented x3. Seems altered. No gross defects.  Lab Results:  [facility] 04/06/11 0555 04/04/11 2110  WBC 15.6* 15.5*  HGB 9.6* 9.6*  HCT 29.1* 28.6*  PLT 294 254   BMET  Basename 04/06/11 0555 04/05/11 0500  NA 142 141  K 3.8 3.4*  CL 106 103  CO2 25 25  GLUCOSE 124* 97  BUN 36* 42*  CREATININE 1.45* 1.51*  CALCIUM 8.8 8.7    Studies/Results: No results found.  Medications:     . aspirin  81 mg Oral Daily  . clopidogrel  75 mg Oral QODAY  . Fluticasone-Salmeterol  1  puff Inhalation Q12H  . furosemide  40 mg Intravenous Daily  . hydrALAZINE  10 mg Oral Q6H  . metoprolol  50 mg Oral Daily  . moxifloxacin  400 mg Oral q1800  . pantoprazole  40 mg Oral Daily  . rivaroxaban  15 mg Oral Q supper  . simvastatin  10 mg Oral q1800  . sodium chloride  3 mL Intravenous Q12H  . Tamsulosin HCl  0.4 mg Oral BID     Assessment/Plan: Andrew [name] is a 78 y.o. C/m admitted with presumed pneumonia bilaterally. 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  1. SOB Bilateral viral/bacterial/atypical pneumonia vs. COPD exacerbation vs. Pulmonary Edema/CHF exacerbation. - c/w avelox covering bacterial/atypical pathogens - c/w O2 and nebs for viral pneumonia/COPD - consulted cardiology feel that patient does need an EP consult. Patient has this set up as an outpatient setting. It appears from cardiology's note that's in the light of the potential pneumonia that surgery during this hospitalization was likely not happen.  2. ischemic cardiomyopathy and coronary artery disease Patient was seen by cardiology no changes in management at this time. Patient is being given Lasix and hydralazine to try to decrease cardiac output. Patient is urinating a little bit better need to make sure patient's by mouth intake stays  up. Patient would like to have EP consult outpatient. Of note patient is on aspirin Plavix and Rivaroxiban and all blood thinners. Do not think patient needs all of these and would consider changing to just the rivaroxiban.  3. AKI Lab Results  Component Value Date   CREATININE 1.45* 04/06/2011   downtrending from 1.54 L monitor now the patient is on Lasix.  2nd to ATN related to CHF exacerbation? Poor oral intake? Will await cardiology recs and possibly give some fluids IV.  4. hypertension better controlled now  the patient is on hydralazine  FEN/GI: heart healthy diet. IV Lock  Prophylaxis: anticoagulated.  Disposition: Patient will continue to be in cardiac step down unit at this time. In addition to this we will get PT/OT to start working with him. We'll have cardiology see him another day and see if there's any other changes I like to make. We'll have case management see patient to start with the potential for oxygen needed at home. Patient likely will be here one to 2 more days and we'll monitor closely.   LOS: 3 days   [name],Andrew 04/07/2011, 10:51 AM

## 2011-04-07 NOTE — Progress Notes (Signed)
SUBJECTIVE: Pt admitted with SOB, thought to be due to pneumonia and acute on chronic systolic dysfunction.  Also with acute renal failure.  The patient is doing well this afternoon.  Feels that his breathing is gradually improving.  At this time, he denies chest pain, palpitations, or any new concerns.     . Fluticasone-Salmeterol  1 puff Inhalation Q12H  . furosemide  40 mg Intravenous Daily  . hydrALAZINE  10 mg Oral Q6H  . metoprolol  50 mg Oral Daily  . moxifloxacin  400 mg Oral q1800  . pantoprazole  40 mg Oral Daily  . rivaroxaban  15 mg Oral Q supper  . simvastatin  10 mg Oral q1800  . sodium chloride  3 mL Intravenous Q12H  . Tamsulosin HCl  0.4 mg Oral BID  . DISCONTD: aspirin  81 mg Oral Daily  . DISCONTD: clopidogrel  75 mg Oral QODAY      OBJECTIVE: Physical Exam: Filed Vitals:   04/07/11 0809 04/07/11 1200 04/07/11 1356 04/07/11 1600  BP:  127/67  108/56  Pulse:  89  82  Temp:  98.5 F (36.9 C)  98 F (36.7 C)  TempSrc:  Oral  Oral  Resp:  28  26  Height:      Weight:      SpO2: 98% 97% 88% 99%    Intake/Output Summary (Last 24 hours) at 04/07/11 1732 Last data filed at 04/07/11 1600  Gross per 24 hour  Intake    603 ml  Output   1295 ml  Net   -692 ml    Telemetry reveals sinus rhythm with NSVT  GEN- The patient is ill appearing, alert and oriented x 3 today.   Head- normocephalic, atraumatic Eyes-  Sclera clear, conjunctiva pink Ears- hearing intact Oropharynx- clear Neck- supple, JVP 10cm Lymph- no cervical lymphadenopathy Lungs- bibasilar rales, mildly tachypneic Heart- Regular rate and rhythm, no murmurs, rubs or gallops, PMI not laterally displaced GI- soft, NT, ND, + BS Extremities- no clubbing, cyanosis, + dependant edema Skin- no rash or lesion Psych- euthymic mood, full affect Neuro- strength and sensation are intact  LABS: Basic Metabolic Panel:  Basename 04/06/11 0555 04/05/11 0500  NA 142 141  K 3.8 3.4*  CL 106 103  CO2  25 25  GLUCOSE 124* 97  BUN 36* 42*  CREATININE 1.45* 1.51*  CALCIUM 8.8 8.7  MG -- --  PHOS -- --   Liver Function Tests:  Basename 04/07/11 0515 04/05/11 0500  AST 149* 44*  ALT 150* 26  ALKPHOS 116 93  BILITOT 0.6 0.6  PROT 6.4 7.0  ALBUMIN 2.3* 2.5*   No results found for this basename: LIPASE:2,AMYLASE:2 in the last 72 hours CBC:  Basename 04/06/11 0555 04/04/11 2110  WBC 15.6* 15.5*  NEUTROABS -- --  HGB 9.6* 9.6*  HCT 29.1* 28.6*  MCV 92.4 91.7  PLT 294 254   Cardiac Enzymes:  Basename 04/05/11 1255 04/05/11 0501 04/04/11 2110  CKTOTAL 81 100 122  CKMB 2.8 3.0 3.1  CKMBINDEX -- -- --  TROPONINI <0.30 <0.30 <0.30   BNP: No components found with this basename: POCBNP:3 D-Dimer: No results found for this basename: DDIMER:2 in the last 72 hours Hemoglobin A1C: No results found for this basename: HGBA1C in the last 72 hours Fasting Lipid Panel: No results found for this basename: CHOL,HDL,LDLCALC,TRIG,CHOLHDL,LDLDIRECT in the last 72 hours Thyroid Function Tests: No results found for this basename: TSH,T4TOTAL,FREET3,T3FREE,THYROIDAB in the last 72 hours Anemia Panel: No results  found for this basename: VITAMINB12,FOLATE,FERRITIN,TIBC,IRON,RETICCTPCT in the last 72 hours  RADIOLOGY: Dg Chest 2 View  04/04/2011  *RADIOLOGY REPORT*  Clinical Data: Shortness of breath with cough and fever for 4 days. Recently diagnosed pneumonia.  CHEST - 2 VIEW  Comparison: 03/09/2010 and 03/10/2011 radiographs.  Findings: There is stable cardiomegaly with chronic interstitial prominence.  Compared with most recent examination, there are new patchy left greater than right basilar and perihilar airspace opacities.  There is no consolidation or significant pleural effusion.  The lungs remain hyperinflated.  No acute osseous findings are seen.  IMPRESSION:  1.  New patchy left greater than right air space opacities suspicious for pneumonia.  Atypical edema is considered less likely.  2.  Underlying cardiomegaly and interstitial prominence are similar to priors.  Original Report Authenticated By: Gerrianne Scale, M.D.   Dg Chest 2 View  03/10/2011  *RADIOLOGY REPORT*  Clinical Data: Weakness, "weird" feeling, shakes, CHF, atrial fibrillation, COPD, hypertension, former smoker  CHEST - 2 VIEW  Comparison: 03/09/2010  Findings: Enlargement of cardiac silhouette. Tortuous aorta. Minimal pulmonary vascular congestion. Diffuse chronic interstitial prominence throughout both lungs, improved versus previous exam, question related to chronic failure. Underlying emphysematous changes. No segmental consolidation, pleural effusion or pneumothorax. Bones demineralized.  IMPRESSION: Enlargement of cardiac silhouette with pulmonary vascular congestion and question minimal chronic failure. Underlying emphysematous changes.  Original Report Authenticated By: Lollie Marrow, M.D.   Ct Head Wo Contrast  03/10/2011  *RADIOLOGY REPORT*  Clinical Data: Acute left-sided hearing loss. Left-sided weakness. Weakness and lethargy.  CT HEAD WITHOUT CONTRAST  Technique:  Contiguous axial images were obtained from the base of the skull through the vertex without contrast.  Comparison: CT head without contrast 03/31/2009.  Findings: A remote right PICA territory infarct is present.  There is a remote lacunar infarct of the left thalamus.  A remote posterior watershed territory infarct on the left is stable.  Mild generalized atrophy is stable.  Mild periventricular white matter hypoattenuation is stable bilaterally as well.  The ventricles are proportionate to the degree of atrophy.  The paranasal sinuses and mastoid air cells are clear.  IMPRESSION:  1.  No acute intracranial abnormality or significant interval change. 2.  Stable remote infarcts involving the right PICA territory, the left thalamus, and left posterior parietal watershed territory. 3.  Stable atrophy and mild white matter disease.  This likely reflects  the sequelae of chronic microvascular ischemia.  Original Report Authenticated By: Jamesetta Orleans. MATTERN, M.D.   ekg 04/05/11- sinus rhythm, LBBB  ASSESSMENT AND PLAN:  Active Problems:  Hypertension  Ischemic cardiomyopathy  Coronary atherosclerosis of native coronary artery  Acute on chronic systolic heart failure  Acute renal insufficiency  Atrial fibrillation  Clinical Summary Andrew [name] is a 78 y.o.male, patient of Dr. Antoine Poche, presently admitted to the hospital with history of productive cough and shortness of breath over the last few weeks. Can piece his own supple no oxygen at home, reports compliance with his regular cardiac medications. Has not experienced any angina or palpitations, no syncope. He is presently being treated for probable pneumonia.   He has a history of CAD and cardiomyopathy, LVEF estimated at 15% with diffuse hypokinesis and inferoposterior and lateral akinesis by echocardiogram in November. He was actually scheduled to see Dr. Graciela Husbands in late December for discussion of ICD, however had to reschedule related to his illness.   We are consulted to assist with management of his cardiomyopathy. Review of recent lab work  finds generally improving creatinine from 1.6 down to 1.4, normal troponin levels, anemia with hemoglobin of 9.6, INR 1.6. Chest x-ray shows left greater than right airspace opacities concerning for infiltrate, however may also represent asymmetric edema.   He is noted to have a left bundle branch block on ECG, old in comparison to tracings from earlier in December. His telemetry has shown episodic atrial fibrillation with RVR. Also ventricular ectopy. He has a known history of atrial flutter however did not tolerate amiodarone related to abnormal LFTs. He has not been on Coumadin recently. Record review finds history of stroke back in early December at which time he was placed on Xarelto.   In review of his medications, Cozaar was held at  admission. He has not tolerated advances in beta blocker dosing based on Dr. Jenene Slicker most recent note.      Allergies   Allergen  Reactions   .  Codeine  Nausea And Vomiting   .  Sulfonamide Derivatives  Itching and Rash      Medications    .  aspirin   81 mg  Oral  Daily   .  clopidogrel   75 mg  Oral  QODAY   .  Fluticasone-Salmeterol   1 puff  Inhalation  Q12H   .  metoprolol   50 mg  Oral  Daily   .  moxifloxacin   400 mg  Oral  q1800   .  pantoprazole   40 mg  Oral  Daily   .  rivaroxaban   15 mg  Oral  Q supper   .  simvastatin   10 mg  Oral  q1800   .  sodium chloride   3 mL  Intravenous  Q12H   .  Tamsulosin HCl   0.4 mg  Oral  BID   .  DISCONTD: albuterol   2.5 mg  Nebulization  Q4H       Past Medical History   Diagnosis  Date   .  LVF (left ventricular failure)         chronic LV systolic failure; last echo in December of 2011 showed EF of 25%.    .  Ischemic heart disease     .  Atrial flutter         off amiodarone because of increased LFTs   .  Shortness of breath         chronic sob with known COPD and probable chronic bronchitis   .  COPD (chronic obstructive pulmonary disease)     .  Bronchitis, chronic     .  Myocardial infarction  1994   .  Gastro - esophageal reflux     .  Claudication     .  BPH (benign prostatic hyperplasia)     .  CHF (congestive heart failure)     .  Coronary artery disease         status post myocardial infarction in 1984,  99 and 07.  He has multiple RCA stents   .  Hypertension     .  Campath-induced atrial fibrillation     .  Hemorrhoids     .  Hyperlipidemia     .  Arrhythmia     .  Hx of adenomatous colonic polyps     .  Hx of colonoscopy         Past Surgical History   Procedure  Date   .  Cardiac catheterization  2008   .  Cataract extraction     .  Salivary gland surgery     .  Coronary angioplasty with stent placement         Family History   Problem  Relation  Age of Onset   .  Coronary artery disease   Father     .  Heart disease  Mother     .  Colon cancer  Neg Hx     .  Heart disease  Brother        Social History Andrew [name] reports that he quit smoking about 2 years ago. He has never used smokeless tobacco. Andrew [name] reports that he drinks alcohol.   Review of Systems No palpitations or syncope. No progressive lower extremity edema. Appetite has been somewhat limited. No abdominal bloating. Does report subjective fevers over the last few weeks, productive cough. Some orthopnea, no PND. No recent angina. No reported melena or hematochezia.   Physical Examination Temp:  [98.1 F (36.7 C)-98.4 F (36.9 C)] 98.4 F (36.9 C) (01/01 0500) Pulse Rate:  [88-103] 103  (01/01 0912) Resp:  [18-22] 18  (01/01 0500) BP: (133-149)/(61-78) 149/71 mmHg (01/01 0912) SpO2:  [91 %-98 %] 91 % (01/01 1020) Weight:  [184 lb 4.9 oz (83.6 kg)] 184 lb 4.9 oz (83.6 kg) (01/01 0500)   I/O last 3 completed shifts: In: 246 [P.O.:240; I.V.:6] Out: 750 [Urine:750]   Chronically ill-appearing male in no acute distress. HEENT: Conjunctiva and lids normal, oropharynx clear with moist mucosa. Neck: Supple, JVP increased, no carotid bruits, no thyromegaly. Lungs: Rhonchorous with scattered crackles, no wheezing, nonlabored breathing at rest. Cardiac: Regular rate and rhythm, no obvious S3, soft basal systolic murmur, no pericardial rub. Abdomen: Soft, nontender, bowel sounds present, no guarding or rebound. Extremities: Trace to 1+ ankle edema, distal pulses 1+, venous stasis. Skin: Warm and dry. Musculoskeletal: No kyphosis. Neuropsychiatric: Alert and oriented x3, affect grossly appropriate.   Testing Lab Results   Component  Value  Date     WBC  15.6*  04/06/2011     HGB  9.6*  04/06/2011     HCT  29.1*  04/06/2011     MCV  92.4  04/06/2011     PLT  294  04/06/2011       Lab Results   Component  Value  Date     CREATININE  1.45*  04/06/2011     BUN  36*  04/06/2011     NA  142  04/06/2011     K   3.8  04/06/2011     CL  106  04/06/2011     CO2  25  04/06/2011       Lab Results   Component  Value  Date     ALT  26  04/05/2011     AST  44*  04/05/2011     ALKPHOS  93  04/05/2011     BILITOT  0.6  04/05/2011       Lab Results   Component  Value  Date     LDLCALC  86  03/11/2011       Lab Results   Component  Value  Date     CKTOTAL  81  04/05/2011     CKMB  2.8  04/05/2011     TROPONINI  <0.30  04/05/2011    Echocardiogram 11/12: - Left ventricle: The cavity size was mildly dilated. Wall thickness was increased in a pattern of mild LVH. The estimated ejection  fraction was 15%. Diffuse hypokinesis. There is akinesis of the posterolateral myocardium. There is akinesis of the inferior myocardium. Doppler parameters are consistent with abnormal left ventricular relaxation (grade 1 diastolic dysfunction). - Left atrium: The atrium was mildly dilated. - Pericardium, extracardiac: A trivial pericardial effusion was identified.     Impression   1.acute on chronic systolic heart failure in the setting of pneumonia. Mildly volume overloaded,  Continue current lasix regimen and follow creatinine Stop metoprolol and start coreg Arb on hold due to acute renal failure  2. Cardiomyopathy, LVEF approximately 15%  Continue to optimize medical therapy Would agree with patient that now is not the time to pursue ICD implantation discussion. Would treat pneumonia and optimize his medication regimen. Given LBBB (QRS 156) could consider either BiV pacemaker or BiV ICD as out long term option.  Will discuss this further as an outpatient.  3. Acute renal insufficiency, creatinine 1.6 at presentation, down to 1.4. Cozaar has been held.  Continue to follow closely with diuresis  4. Left bundle branch block by ECG. (as above)  5. H/o afib and atrial flutter- continue monitor off of AAD,  Continue xarelto for stroke prevention  6. CAD with prior history of inferior infarct.  Agree with  stopping ASA and Plavix  7. Hypertension stable  8. Stroke in December. Continue xarelto long term  Cardiac rehab to follow Would transfer to telemetry  Hillis Range, MD 04/07/2011 5:32 PM

## 2011-04-07 NOTE — Progress Notes (Signed)
Physical Therapy Evaluation Patient Details Name: Andrew [name] MRN: [id number] DOB: 21-Nov-1930 Today's Date: 04/07/2011  Problem List:  Patient Active Problem List  Diagnoses  . UNSPECIFIED ANEMIA  . COPD UNSPECIFIED  . GERD  . DYSPNEA  . Nonspecific (abnormal) findings on radiological and other examination of body structure  . LIVER FUNCTION TESTS, ABNORMAL, HX OF  . PERSONAL HISTORY OF COLONIC POLYPS  . ABNORMAL LUNG XRAY  . Ischemic heart disease  . Atrial flutter  . LVF (left ventricular failure)  . Hypertension  . Ischemic cardiomyopathy  . V-tach  . Hyperlipidemia  . Coronary atherosclerosis of native coronary artery  . Acute on chronic systolic heart failure  . Acute renal insufficiency  . Atrial fibrillation    Past Medical History:  Past Medical History  Diagnosis Date  . LVF (left ventricular failure)     chronic LV systolic failure; last echo in December of 2011 showed EF of 25%.   . Ischemic heart disease   . Atrial flutter     off amiodarone because of increased LFTs  . Shortness of breath     chronic sob with known COPD and probable chronic bronchitis  . COPD (chronic obstructive pulmonary disease)   . Bronchitis, chronic   . Myocardial infarction 1994  . Gastro - esophageal reflux   . Claudication   . BPH (benign prostatic hyperplasia)   . CHF (congestive heart failure)   . Coronary artery disease     status post myocardial infarction in 1984,  99 and 07.  He has multiple RCA stents  . Hypertension   . Campath-induced atrial fibrillation   . Hemorrhoids   . Hyperlipidemia   . Arrhythmia   . Hx of adenomatous colonic polyps   . Hx of colonoscopy    Past Surgical History:  Past Surgical History  Procedure Date  . Cardiac catheterization 2008  . Cataract extraction   . Salivary gland surgery   . Coronary angioplasty with stent placement     PT Assessment/Plan/Recommendation PT Assessment Clinical Impression Statement: Pt is a 78 y/o male  admitted with SOB and possible PNA along with the below PT problem list.  Pt would benefit from acute PT to maximize independence and facilitate d/c home with HHPT. PT Recommendation/Assessment: Patient will need skilled PT in the acute care venue PT Problem List: Decreased strength;Decreased activity tolerance;Decreased balance;Decreased mobility;Decreased knowledge of use of DME Barriers to Discharge: None PT Therapy Diagnosis : Difficulty walking;Generalized weakness PT Plan PT Frequency: Min 3X/week PT Treatment/Interventions: DME instruction;Gait training;Stair training;Functional mobility training;Therapeutic activities;Balance training;Patient/family education PT Recommendation Follow Up Recommendations: Home health PT Equipment Recommended: None recommended by PT PT Goals  Acute Rehab PT Goals PT Goal Formulation: With patient Time For Goal Achievement: 7 days Pt will go Supine/Side to Sit: with modified independence PT Goal: Supine/Side to Sit - Progress: Not met Pt will go Sit to Supine/Side: with modified independence PT Goal: Sit to Supine/Side - Progress: Not met Pt will go Sit to Stand: with modified independence PT Goal: Sit to Stand - Progress: Not met Pt will go Stand to Sit: with modified independence PT Goal: Stand to Sit - Progress: Not met Pt will Ambulate: >150 feet;with modified independence;with least restrictive assistive device PT Goal: Ambulate - Progress: Not met Pt will Go Up / Down Stairs: 3-5 stairs;with supervision;with least restrictive assistive device PT Goal: Up/Down Stairs - Progress: Not met  PT Evaluation Precautions/Restrictions  Precautions Precautions: Fall Required Braces or Orthoses: No Restrictions  Weight Bearing Restrictions: No Prior Functioning  Home Living Lives With: Spouse Receives Help From: Family Type of Home: Mobile home Home Layout: One level Home Access: Stairs to enter Entrance Stairs-Rails: Right Entrance  Stairs-Number of Steps: 3 Home Adaptive Equipment: Dan Humphreys - four wheeled Prior Function Level of Independence: Independent with basic ADLs;Independent with transfers;Requires assistive device for independence Able to Take Stairs?: Yes Driving: Yes Cognition Cognition Arousal/Alertness: Awake/alert Overall Cognitive Status: Appears within functional limits for tasks assessed Sensation/Coordination Sensation Light Touch: Appears Intact Stereognosis: Not tested Hot/Cold: Not tested Proprioception: Not tested Coordination Gross Motor Movements are Fluid and Coordinated: Yes Fine Motor Movements are Fluid and Coordinated: Not tested Extremity Assessment RUE Assessment RUE Assessment: Within Functional Limits LUE Assessment LUE Assessment: Within Functional Limits RLE Assessment RLE Assessment: Exceptions to [facility] RLE Strength RLE Overall Strength: Within Functional Limits for tasks assessed RLE Overall Strength Comments: 4-/5 LLE Assessment LLE Assessment: Within Functional Limits Pain 0/10 with evaluation. Mobility (including Balance) Bed Mobility Bed Mobility: Yes Supine to Sit: 4: Min assist;HOB flat Supine to Sit Details (indicate cue type and reason): Assist for trunk to translate anterior with cues for sequence. Transfers Transfers: Yes Sit to Stand: 4: Min assist;With upper extremity assist;From bed;From chair/3-in-1 (2 trials.) Sit to Stand Details (indicate cue type and reason): Assist for balance to translate trunk anterior over BOS.  Cues for safest hand placement. Stand to Sit: 4: Min assist;With upper extremity assist;To chair/3-in-1 (2 trials.) Stand to Sit Details: Assist to control slow descent to chair.  Cues for hand placement. Ambulation/Gait Ambulation/Gait: Yes Ambulation/Gait Assistance: 4: Min assist Ambulation/Gait Assistance Details (indicate cue type and reason): Assist for balance with cues to extend posture to tall stance.  Cues to also stay inside  RW.  Right LE with tendency to externally rotate and drag behind with gait. Ambulation Distance (Feet): 112 Feet Assistive device: Rolling walker Gait Pattern: Decreased step length - left;Decreased dorsiflexion - right;Trunk flexed (Right LE externally rotated.) Stairs: No Wheelchair Mobility Wheelchair Mobility: No  Posture/Postural Control Posture/Postural Control: No significant limitations Balance Balance Assessed: No End of Session PT - End of Session Equipment Utilized During Treatment: Gait belt Activity Tolerance: Patient tolerated treatment well Patient left: in chair;with call bell in reach Nurse Communication: Mobility status for transfers;Mobility status for ambulation General Behavior During Session: [facility] for tasks performed Cognition: [facility] for tasks performed  Cephus Shelling 04/07/2011, 2:16 PM  04/07/2011 Cephus Shelling, PT, DPT [phone]

## 2011-04-07 NOTE — Progress Notes (Signed)
Family Medicine Teaching Service Attending Note  I interviewed and examined patient Andrew [name] and reviewed their tests and x-rays.  I discussed with Dr. Katrinka [name] and reviewed their note for today.  I agree with their assessment and plan.     Additionally  Feels slightly better still weak Appreciated cardiology consult Andrew [name] would like to get stronger from his acute illness and follow up with his already scheduled EP appointment as otpt Agree with stopping Plavix and ASA and continue rivaroxaban Monitor activity tolerance, Crt and blood pressure closely

## 2011-04-07 NOTE — Progress Notes (Signed)
UR Completed.  Adalea Handler Norris 04/07/2011 [phone]  

## 2011-04-08 LAB — BASIC METABOLIC PANEL
BUN: 34 mg/dL — ABNORMAL HIGH (ref 6–23)
CO2: 28 mEq/L (ref 19–32)
Calcium: 8.6 mg/dL (ref 8.4–10.5)
Creatinine, Ser: 1.45 mg/dL — ABNORMAL HIGH (ref 0.50–1.35)
GFR calc non Af Amer: 45 mL/min — ABNORMAL LOW (ref >90)
Glucose, Bld: 99 mg/dL (ref 70–99)

## 2011-04-08 MED ORDER — CARVEDILOL 12.5 MG PO TABS: 12.5000 mg | ORAL_TABLET | Freq: Two times a day (BID) | ORAL | Status: DC

## 2011-04-08 MED ORDER — LOSARTAN POTASSIUM 50 MG PO TABS
50.0000 mg | ORAL_TABLET | Freq: Every day | ORAL | Status: DC
  Administered 2011-04-08: 50 mg via ORAL
  Filled 2011-04-08: qty 1

## 2011-04-08 MED ORDER — LOSARTAN POTASSIUM 50 MG PO TABS: 50.0000 mg | ORAL_TABLET | Freq: Every day | ORAL | Status: DC

## 2011-04-08 MED ORDER — POTASSIUM CHLORIDE CRYS ER 20 MEQ PO TBCR
40.0000 meq | EXTENDED_RELEASE_TABLET | Freq: Once | ORAL | Status: AC
  Administered 2011-04-08: 40 meq via ORAL
  Filled 2011-04-08: qty 2

## 2011-04-08 MED ORDER — MOXIFLOXACIN HCL 400 MG PO TABS: 400.0000 mg | ORAL_TABLET | Freq: Every day | ORAL | Status: DC

## 2011-04-08 NOTE — Progress Notes (Signed)
CARE MANAGEMENT NOTE HEART FAILURE  04/08/2011   Patient:  Andrew [name], Andrew [name]   Account Number:  [id number]    Date Initiated:  04/05/2011  Documentation initiated by:  Shannan Harper  Subjective/Objective Assessment:   Patient admitted after a several day hx of SOB.  Using oxygen at home at night, requiring it during the day.  Went to Urgent Care and then transferred to [facility] due to PNA. Hx of CHF.   Action/Plan:   Discharge back to home with spouse.   Anticipated DC Date:  04/06/2011  Anticipated DC Plan:  HOME/SELF CARE  DC Planning Services:  CM consult    Choice offered to / List presented to:   DME arranged:  [facility]     DME agency:  [facility]       Status of service:  Completed, signed off  Medicare Important Message Given:   (If response is "NO", the following Medicare IM given date fields will be blank) Date Medicare IM Given:   Date Additional Medicare IM Given:    Discharge Disposition:  HOME/SELF CARE  Per UR Regulation:  Reviewed for med. necessity/level of care/duration of stay  Comments:   Received referral for home oxygen therapy.  Met again with patient and he already has everything he needs for oxygen therapy.  His portable tank is in his truck which is here at the hospital.  His wife and/or daughter will get the tank prior to his discharge.  He already has his concentrator at home as noted below.  Will continue to assist. 04/08/11 1615 Shannan Harper, RN, BSN  UR Concurrent Review for Increase in LOC to SD completed. 04/07/11 0600 Shannan Harper, RN, BSN  Met with patient and daughter Liborio Nixon.  Discussed HH and patient refused services.  Stated he is comfortable with a [employer] product that checks in on him.  He did ask if I could notify [facility] that he is in the hospital as they were to come out to his home yesterday to switch out equipment.  I spoke with Amparo Bristol and they will call the patient on Wednesday to make an appt with him once he has returned home.  I called  back to patient's room to notify him of the [employer] communication.  Heart Failure Packet was also provided by the NCM.  Weight discussed, patient has a working scale at home and weighs daily.  Spoke in regards to salt intake and zones.  Will continue to assist as needed. 04/05/11 1646 Shannan Harper, RN, BSN  UR Initial Review Completed. 04/05/11 1427 Shannan Harper, RN, BSN   Initial CM contact:  04/05/2011 12:00 M  By:  Shannan Harper Initial CSW contact:     By:      Is this an INP Readmission < 30 days:  Y (If "YES" please see readmission information at the bottom of note)  Patient living status prior to this admission:  FAMILY  Patient setting prior to this admission:  HOME  Comorbid conditions being treated that contributed to this admission:  HIGH CHF, COPD, Aflutter, LVF disfunction  CHF Readmission Risk:  high  Type of patient education provided  HF Patient Education Assessment / Teach Back  HF Zone Tool / Magnet  Limit salt intake  Weigh daily     Patient education provided by  [facility]    Was referral made to [facility]:  N  Is the patient's PCP the same as attending:  N PCP:  RICHTER,KAREN L.  Readmission < 30 Days If pt has  HH, did they contact the agency before going to the ED:   Name of [facility] agency:    Was the follow-up physician visit scheduled prior to discharge:  Y  Did the patient follow-up with the physician prior to this readmission:  Y  Was there HF Clinic visits prior to readmission:    Were there ED visits between admissions:  N  Readmit type:  Unscheduled/Unrelated  If unscheduled and related indicate reason for readmit:

## 2011-04-08 NOTE — Discharge Summary (Signed)
Physician Discharge Summary  Patient ID: Andrew [name] MRN: [id number] DOB/AGE: [birth year] 78 y.o.  Admit date: 04/04/2011 Discharge date: 04/08/2011  Admission Diagnoses: Hospital Acquired Pneumonia COPD - Acute Exacerbation Hypoxia  Discharge Diagnoses:  Active Problems:  Hypertension  Ischemic cardiomyopathy  Coronary atherosclerosis of native coronary artery  Acute on chronic systolic heart failure  Acute renal insufficiency  Atrial fibrillation COPD Hypoxia -  With o2 requirement  Discharged Condition: improved  Hospital Course: The patient was admitted on 04/04/2011 for a presumed hospital-acquired pneumonia. Patient was found to have a new left bundle branch block on EKG and cardiac enzymes were cycled (found to be negative) and cardiology was consult due to his significant past cardiac history. He was found to have a elevated creatinine to 1.68 and his ACE inhibitor was stopped. Due to a recent stroke patient had been started on Xaralto while being continued on his Plavix and aspirin.  Due to increased risk of bleeding his Plavix and aspirin were stopped during his hospitalization.    During his hospitalization - especially in light of his EF of 15% - it was felt that the majority of his respiratory symptoms were coming from congestive heart failure and he was diuresed with Lasix.  He was -5 pounds on the day of discharge. He will continue to titrate his Lasix at home as he previously was doing. Cardiology did see him during this hospitalization however electrophysiology intervention will be deferred until he is over his acute illness and is improved clinically. He will need a AICD as well as a pacer that will be completed at a later date.  Mr. Tomaszewski did improve clinically throughout his hospitalization with no acute events, however he did continue to have an oxygen requirement throughout his hospitalization as well as a non-productive cough and dyspnea.  He will be continued on  home oxygen 2 L continuous to be titrated down with clinical improvement.  We will insure home O2 is available and have him followup as an outpatient with cardiology and electrophysiology as well as his primary care physician in one to 2 weeks.  Consults: cardiology  Significant Diagnostic Studies: None  Treatments:  Avelox Lasix   Discharge Exam: BP 110/68  Pulse 86  Temp(Src) 97.8 F (36.6 C) (Oral)  Resp 22  Ht 6' (1.829 m)  Wt 181 lb 8 oz (82.328 kg)  BMI 24.62 kg/m2  SpO2 91% PE: GENERAL: Adult Caucasian male sitting comfortably on bed in hospital bed. In acute discomfort. In mild respiratory distress with conversational dyspnea H&N: Atraumatic normocephalic, moist mucous membranes, no scleral icterus HEART: Distant heart sounds, no murmur appreciated, LUNGS: Mild bi-basilar crackles,  ABDOMEN: +BS soft, non-tender EXTREMITIES: Trace pretibial edema, LE peripheral pulses 1+/4 SKIN: senile changes; no venous stasis changes.    Disposition: Home or Self Care  Discharge Orders    Future Appointments: Provider: Department: Dept Phone: Center:   04/16/2011 10:15 AM Rollene Rotunda, MD [facility] [phone] [facility]   04/28/2011 2:15 PM Duke Salvia, MD [facility] [phone] [facility]     Future Orders Please Complete By Expires   Diet - low sodium heart healthy      Increase activity slowly        Medication List  As of 04/08/2011 12:44 PM   START taking these medications         carvedilol 12.5 MG tablet   Commonly known as: COREG   Take 1 tablet (12.5 mg total) by mouth 2 (two)  times daily with a meal.      moxifloxacin 400 MG tablet   Commonly known as: AVELOX   Take 1 tablet (400 mg total) by mouth daily at 6 PM.         CHANGE how you take these medications         losartan 50 MG tablet   Commonly known as: COZAAR   Take 1 tablet (50 mg total) by mouth daily.   What changed: - medication strength - dose         CONTINUE  taking these medications         acetaminophen 500 MG tablet   Commonly known as: TYLENOL      ALPRAZolam 0.25 MG tablet   Commonly known as: XANAX      calcium carbonate 750 MG chewable tablet   Commonly known as: TUMS EX      Fluticasone-Salmeterol 250-50 MCG/DOSE Aepb   Commonly known as: ADVAIR      furosemide 40 MG tablet   Commonly known as: LASIX      GAS-X ULTRA STRENGTH PO      HYDROcodone-acetaminophen 5-500 MG per tablet   Commonly known as: VICODIN      MAALOX MAXIMUM STRENGTH PO      magnesium hydroxide 400 MG/5ML suspension   Commonly known as: MILK OF MAGNESIA      meclizine 25 MG tablet   Commonly known as: ANTIVERT      NEXIUM 40 MG capsule   Generic drug: esomeprazole      nitroGLYCERIN 0.4 MG SL tablet   Commonly known as: NITROSTAT   Place 1 tablet (0.4 mg total) under the tongue every 5 (five) minutes as needed for chest pain.      pravastatin 20 MG tablet   Commonly known as: PRAVACHOL      prenatal multivitamin Tabs      * PROAIR HFA IN      * albuterol (2.5 MG/3ML) 0.083% NEBU 3 mL, albuterol (5 MG/ML) 0.5% NEBU 0.5 mL      Rivaroxaban 20 MG Tabs      Tamsulosin HCl 0.4 MG Caps   Commonly known as: FLOMAX     * Notice: This list has 2 medication(s) that are the same as other medications prescribed for you. Read the directions carefully, and ask your doctor or other care provider to review them with you.       STOP taking these medications         aspirin 81 MG tablet      clopidogrel 75 MG tablet      metoprolol 50 MG 24 hr tablet      ROBITUSSIN COLD COUGH+ CHEST PO          Where to get your medications    These are the prescriptions that you need to pick up. We sent them to a specific pharmacy, so you will need to go there to get them.   [id number] [place], [place] - [address]    [address] [place] [place] [address]    Phone: [phone]        moxifloxacin 400 MG tablet         You may get  these medications from any pharmacy.         carvedilol 12.5 MG tablet   losartan 50 MG tablet           Follow-up Information    Follow up with Dois Davenport., MD. (  In 1 - 2 weeks)         Follow-up Issues: Will need Cardiac EP appointment for AICD + pacer.  Consider addition of Epelerone as pt did not tolerate spironolactone due to gynecomastia and breast pain.  Pt Losartan dose decreased to 50mg  q day.  Will need BP followed closely.    Gaspar Bidding, DO [facility] Resident - PGY-1 04/08/2011 12:44 PM

## 2011-04-08 NOTE — Progress Notes (Addendum)
Patient Name: Andrew [name] Date of Encounter: 04/08/2011, 7:15 AM    Subjective  No chest pain. "I feel stronger." Less SOB although just this morning had a "spell" of labored breathing.   Objective   Telemetry: NSR with occasional PVCs, couplets, and one 3-beat NSVT overnight Physical Exam: Filed Vitals:   04/08/11 0622  BP: 133/66  Pulse: 85  Temp:   Resp: 22  93% [place] General: Well developed, well nourished, in no acute distress. Head: Normocephalic, atraumatic, sclera non-icteric, no xanthomas, nares are without discharge.  Neck: Negative for carotid bruits. JVD minimally elevated. Lungs: Decreased BS L>R with faint basilar rales on the right. +basilar rhonchi cleared by cough. No wheezing. Breathing is unlabored. Heart: RRR S1 S2 with occasional ectomy. No rubs or gallops.  Abdomen: Soft, non-tender, non-distended with normoactive bowel sounds. No hepatomegaly. No rebound/guarding. No obvious abdominal masses. Msk:  Strength and tone appear normal for age. Extremities: No clubbing or cyanosis. No edema.  Distal pedal pulses are 2+ and equal bilaterally. Neuro: Alert and oriented X 3. Moves all extremities spontaneously. Psych:  Responds to questions appropriately with a normal affect.    Intake/Output Summary (Last 24 hours) at 04/08/11 0715 Last data filed at 04/08/11 0512  Gross per 24 hour  Intake    360 ml  Output   1175 ml  Net   -815 ml    Labs:  [facility] 04/08/11 0507 04/06/11 0555  NA 139 142  K 3.5 3.8  CL 100 106  CO2 28 25  GLUCOSE 99 124*  BUN 34* 36*  CREATININE 1.45* 1.45*  CALCIUM 8.6 8.8  MG -- --  PHOS -- --    Basename 04/07/11 0515  AST 149*  ALT 150*  ALKPHOS 116  BILITOT 0.6  PROT 6.4  ALBUMIN 2.3*     Basename 04/06/11 0555  WBC 15.6*  NEUTROABS --  HGB 9.6*  HCT 29.1*  MCV 92.4  PLT 294    Basename 04/05/11 1255  CKTOTAL 81  CKMB 2.8  TROPONINI <0.30   Radiology/Studies:  1. Chest 2 View 04/04/2011  *RADIOLOGY  REPORT*  Clinical Data: Shortness of breath with cough and fever for 4 days. Recently diagnosed pneumonia.  CHEST - 2 VIEW  Comparison: 03/09/2010 and 03/10/2011 radiographs.  Findings: There is stable cardiomegaly with chronic interstitial prominence.  Compared with most recent examination, there are new patchy left greater than right basilar and perihilar airspace opacities.  There is no consolidation or significant pleural effusion.  The lungs remain hyperinflated.  No acute osseous findings are seen.  IMPRESSION:  1.  New patchy left greater than right air space opacities suspicious for pneumonia.  Atypical edema is considered less likely. 2.  Underlying cardiomegaly and interstitial prominence are similar to priors.  Original Report Authenticated By: Gerrianne Scale, M.D.     Assessment and Plan   1. Acute on chronic systolic CHF in the setting of pneumonia - EF 15% (was 25-30% in 03/2010, 55% in 09/2008 - with consistently low EF, will need consideration for ICD and/or CRT to be done as an outpatient given acute illness - with drop in EF, might consider ischemic eval although at present time is not suitable candidate for cath with renal insufficiency & recent CVA) - day 4 of Avelox. WBC still 15k, mgmt of PNA per primary team. afebrile  - continue Coreg & hydralazine, may be able to add isosorbide as pressure allows (ARB on hold secondary to renal insuff) -  would hold off spironolactone given renal insufficiency right now - will defer decision of diuresis to MD who can compare pt's status to yesterday - only mild volume overload on exam. Weight is 181 (peak 185 on 04/06/10) - his po intake has been modest  2. CAD - hx RCA stenting with last cath 2008 nonobstructive dz. Cont BB. ASA/Plavix were discontinued this admission because of concurrent Xarelto/risk of bleeding. No chest pain.  3. Transaminitis - unclear etiology. No abdominal sx. Hold statin for now & follow. Consider nutrition eval for CHF  + poor nutritional status as evidenced by albumin 2.3.  3. Hx afib/flutter with hx of stroke - con't Xarelto & with anemia would follow closely  4. Acute renal insufficiency - Cr showing stability this admission, etiology not completely clear. ARB on hold.  5. PVCs/NSVT - keep K>4.0. Will give KCl this AM. Trend K with Cr as he might need maintenance K.  Signed, Ronie Spies PA-C  I have seen, examined the patient, and reviewed the above assessment and plan.   Pt remains mildly volume overloaded.  Continues to have dyspnea related to his pneumonia.  Primary team is setting up for home O2. If he goes home, he will need very close outpatient follow-up.  I will defer this decision to primary team.  Co Sign: Hillis Range, MD 04/08/2011 2:39 PM

## 2011-04-08 NOTE — Progress Notes (Signed)
Pt amb to nurses station without o2 and o2 dropped to 80.  Pt amb back to room safely and put o2 back on and stats rose to 94. Dr. Deirdre Priest notified.

## 2011-04-08 NOTE — Discharge Summary (Signed)
Family Medicine Teaching Service Attending Note  I interviewed and examined patient Andrew [name] and reviewed their tests and x-rays.  I discussed with Dr. Berline Chough and reviewed their note for today.  I agree with their assessment and plan.     Additionally  Able to walk 60 feet without chest pain or significant shortness of breath while using O2 Medically stable for discharge.  Finish antibiotics and follow up with PCP and cardiology. Prognosis is guarded given severe systolic dysfunctino

## 2011-04-10 LAB — CULTURE, BLOOD (ROUTINE X 2)
Culture  Setup Time: 201212302357
Culture: NO GROWTH

## 2011-04-16 ENCOUNTER — Encounter: Payer: Self-pay | Admitting: *Deleted

## 2011-04-16 ENCOUNTER — Encounter: Payer: Self-pay | Admitting: Cardiology

## 2011-04-16 ENCOUNTER — Ambulatory Visit (INDEPENDENT_AMBULATORY_CARE_PROVIDER_SITE_OTHER): Payer: Medicare Other | Admitting: Cardiology

## 2011-04-16 DIAGNOSIS — I4892 Unspecified atrial flutter: Secondary | ICD-10-CM

## 2011-04-16 DIAGNOSIS — I5023 Acute on chronic systolic (congestive) heart failure: Secondary | ICD-10-CM

## 2011-04-16 DIAGNOSIS — I10 Essential (primary) hypertension: Secondary | ICD-10-CM

## 2011-04-16 DIAGNOSIS — Z01810 Encounter for preprocedural cardiovascular examination: Secondary | ICD-10-CM

## 2011-04-16 DIAGNOSIS — I2510 Atherosclerotic heart disease of native coronary artery without angina pectoris: Secondary | ICD-10-CM

## 2011-04-16 DIAGNOSIS — N289 Disorder of kidney and ureter, unspecified: Secondary | ICD-10-CM

## 2011-04-16 MED ORDER — LOSARTAN POTASSIUM 50 MG PO TABS: 50.0000 mg | ORAL_TABLET | Freq: Two times a day (BID) | ORAL | Status: DC

## 2011-04-16 NOTE — Assessment & Plan Note (Signed)
There has been a history of fibrillation though I have not seen this and it was not documented during recent hospitalizations. However, given the questionable TIA and some evidence for old CVAs and a past history fibrillation with his cardiomyopathy I agree that anticoagulation is indicated. He used to be on amiodarone but is off of this because of elevated LFTs. Of note I will hold his Xarelto for three days prior to the procedure.

## 2011-04-16 NOTE — Patient Instructions (Signed)
Please increase Cozaar to 50 mg twice a day Continue all other medications as listed  Please have blood work drawn 04/28/2011 (BMP,CBC,PT)  Your physician has requested that you have a cardiac catheterization. Cardiac catheterization is used to diagnose and/or treat various heart conditions. Doctors may recommend this procedure for a number of different reasons. The most common reason is to evaluate chest pain. Chest pain can be a symptom of coronary artery disease (CAD), and cardiac catheterization can show whether plaque is narrowing or blocking your heart's arteries. This procedure is also used to evaluate the valves, as well as measure the blood flow and oxygen levels in different parts of your heart. For further information please visit [email or web address] Please follow instruction sheet, as given.  Follow up appointment will be made after the cath.

## 2011-04-16 NOTE — Assessment & Plan Note (Signed)
I will check a BMET prior to his cath.  He will not have an LV gram.

## 2011-04-16 NOTE — Assessment & Plan Note (Signed)
His blood pressure is slightly elevated.  I'm going to be increasing his Cozaar to 50 mg twice a day.  I will check a BMET as above.

## 2011-04-16 NOTE — Progress Notes (Signed)
HPI The patient presents for followup of his known coronary disease and cardiomyopathy.  He was hospitalized twice in December. The first was for some speech difficulties and though there was no clear diagnosis of a CVA he was put on Rivaroxaban.  He was hospitalized later with questionable pneumonia, COPD flare and some component of a heart failure exacerbation. He's known to have a cardiomyopathy with previous ejection fraction to 25%. Most recently in the office a followup echo demonstrated his EF to be 15%. He is being referred to Dr. Graciela Husbands for consideration of a defibrillator. Since being discharged from the hospital he has had fatigue. He's had some episodic dyspnea. He's had home O2 for a long time and he's been wearing this more frequently. He's not describing any new PND or orthopnea. He's not been having any palpitations, presyncope or syncope. He thinks his weights are stable and he's had no new edema. He is watching his salt. He takes his medications as listed. Of note he was switched from metoprolol to carvedilol in the hospital. I tried to up titrate beta blocker in the past but he didn't tolerate it. His dose of Cozaar was reduced apparently secondary to hypotension. He says his blood pressures have been drifting back up.  Allergies  Allergen Reactions  . Codeine Nausea And Vomiting  . Sulfonamide Derivatives Itching and Rash    Current Outpatient Prescriptions  Medication Sig Dispense Refill  . acetaminophen (TYLENOL) 500 MG tablet Take 1,000 mg by mouth every 12 (twelve) hours as needed. For pain.      [name] albuterol (2.5 MG/3ML) 0.083% NEBU 3 mL, albuterol (5 MG/ML) 0.5% NEBU 0.5 mL Inhale 2.5 mg into the lungs 3 (three) times daily as needed. For shortness of breath.      . Albuterol Sulfate (PROAIR HFA IN) Inhale 2 puffs into the lungs as needed. Shortness of breath.      . ALPRAZolam (XANAX) 0.25 MG tablet Take 0.25 mg by mouth at bedtime as needed. For anxiety.      . Alum & Mag  Hydroxide-Simeth (MAALOX MAXIMUM STRENGTH PO) Take 30 mLs by mouth daily as needed. For constipation      . calcium carbonate (TUMS EX) 750 MG chewable tablet Chew 3 tablets by mouth daily as needed. For upset stomach.      . carvedilol (COREG) 12.5 MG tablet Take 1 tablet (12.5 mg total) by mouth 2 (two) times daily with a meal.  30 tablet  0  . esomeprazole (NEXIUM) 40 MG capsule Take 40 mg by mouth 2 (two) times daily.       . Fluticasone-Salmeterol (ADVAIR) 250-50 MCG/DOSE AEPB Inhale 1 puff into the lungs every 12 (twelve) hours.       . furosemide (LASIX) 40 MG tablet Take 40-80 mg by mouth daily. 80 mg if weight >186      . HYDROcodone-acetaminophen (VICODIN) 5-500 MG per tablet Take 1 tablet by mouth every 12 (twelve) hours as needed. For pain.      [name] losartan (COZAAR) 50 MG tablet Take 1 tablet (50 mg total) by mouth daily.  30 tablet  0  . magnesium hydroxide (MILK OF MAGNESIA) 400 MG/5ML suspension Take 45 mLs by mouth daily as needed. For constipation.      . meclizine (ANTIVERT) 25 MG tablet Take 25 mg by mouth 3 (three) times daily as needed. for dizziness.      . nitroGLYCERIN (NITROSTAT) 0.4 MG SL tablet Place 1 tablet (0.4 mg total) under  the tongue every 5 (five) minutes as needed for chest pain.  25 tablet  6  . pravastatin (PRAVACHOL) 20 MG tablet Take 20 mg by mouth every evening.        . Prenatal Vit-Fe Fumarate-FA (PRENATAL MULTIVITAMIN) TABS Take 1 tablet by mouth every evening.        . Rivaroxaban 20 MG TABS Take 20 mg by mouth daily with supper.        . Simethicone (GAS-X ULTRA STRENGTH PO) Take 2 tablets by mouth as needed. For indigestion      . Tamsulosin HCl (FLOMAX) 0.4 MG CAPS Take 0.4 mg by mouth 2 (two) times daily.       [name] DISCONTD: metoprolol tartrate (LOPRESSOR) 25 MG tablet Take 1/2 tablet every other day   45 tablet  3    Past Medical History  Diagnosis Date  . LVF (left ventricular failure)     chronic LV systolic failure; last echo in December of  2011 showed EF of 25%.   . Ischemic heart disease   . Atrial flutter     off amiodarone because of increased LFTs  . Shortness of breath     chronic sob with known COPD and probable chronic bronchitis  . COPD (chronic obstructive pulmonary disease)   . Bronchitis, chronic   . Myocardial infarction 1994  . Gastro - esophageal reflux   . Claudication   . BPH (benign prostatic hyperplasia)   . CHF (congestive heart failure)   . Coronary artery disease     status post myocardial infarction in 1984,  99 and 07.  He has multiple RCA stents  . Hypertension   . Atrial fibrillation   . Hemorrhoids   . Hyperlipidemia   . Arrhythmia   . Hx of adenomatous colonic polyps   . Hx of colonoscopy     Past Surgical History  Procedure Date  . Cardiac catheterization 2008  . Cataract extraction   . Salivary gland surgery   . Coronary angioplasty with stent placement     ROS:  As stated in the HPI and negative for all other systems.  PHYSICAL EXAM BP 149/77  Pulse 46  Ht 6' (1.829 m)  Wt 187 lb (84.823 kg)  BMI 25.36 kg/m2 GENERAL:  Frail appearing HEENT:  Pupils equal round and reactive, fundi not visualized, oral mucosa unremarkable, dentures NECK:  No jugular venous distention, waveform within normal limits, carotid upstroke brisk and symmetric, no bruits, no thyromegaly LYMPHATICS:  No cervical, inguinal adenopathy LUNGS:  Clear to auscultation bilaterally BACK:  No CVA tenderness CHEST:  Unremarkable HEART:  PMI not displaced or sustained,S1 and S2 within normal limits, no S3, no S4, no clicks, no rubs, no murmurs ABD:  Flat, positive bowel sounds normal in frequency in pitch, no bruits, no rebound, no guarding, no midline pulsatile mass, no hepatomegaly, no splenomegaly EXT:  2 plus pulses throughout, no edema, no cyanosis no clubbing, chronic venous stasis changes. SKIN:  No rashes no nodules, bruising NEURO:  Cranial nerves II through XII grossly intact, motor grossly intact  throughout PSYCH:  Cognitively intact, oriented to person place and time  ASSESSMENT AND PLAN

## 2011-04-16 NOTE — Assessment & Plan Note (Signed)
Given the following his ejection fraction and his increasing dyspnea I will bring him back for an outpatient right and left heart catheterization. I will let him proceed with PET evaluation as he would like to consider ICD placement.

## 2011-04-17 ENCOUNTER — Other Ambulatory Visit: Payer: Self-pay | Admitting: Cardiology

## 2011-04-17 DIAGNOSIS — I2510 Atherosclerotic heart disease of native coronary artery without angina pectoris: Secondary | ICD-10-CM

## 2011-04-19 ENCOUNTER — Telehealth: Payer: Self-pay | Admitting: Cardiology

## 2011-04-19 MED ORDER — RIVAROXABAN 20 MG PO TABS: 20.0000 mg | ORAL_TABLET | Freq: Every day | ORAL | Status: DC

## 2011-04-19 MED ORDER — CARVEDILOL 12.5 MG PO TABS: 12.5000 mg | ORAL_TABLET | Freq: Two times a day (BID) | ORAL | Status: DC

## 2011-04-19 NOTE — Telephone Encounter (Signed)
New msg Pt's wife is calling abourt xarelto and coreg. She said he was just in the office She wants to know if he should continue both of these meds. Please call Both of these have no refills. [facility] [address]

## 2011-04-19 NOTE — Telephone Encounter (Signed)
I spoke with the pt's wife and made her aware that the pt should continue the Xarelto and Coreg.  Prescriptions were sent to  pharmacy through [facility].

## 2011-04-20 ENCOUNTER — Ambulatory Visit (INDEPENDENT_AMBULATORY_CARE_PROVIDER_SITE_OTHER): Payer: Medicare Other

## 2011-04-20 DIAGNOSIS — R05 Cough: Secondary | ICD-10-CM

## 2011-04-20 DIAGNOSIS — R059 Cough, unspecified: Secondary | ICD-10-CM

## 2011-04-20 DIAGNOSIS — J40 Bronchitis, not specified as acute or chronic: Secondary | ICD-10-CM

## 2011-04-20 DIAGNOSIS — G4700 Insomnia, unspecified: Secondary | ICD-10-CM

## 2011-04-28 ENCOUNTER — Encounter: Payer: Self-pay | Admitting: Internal Medicine

## 2011-04-28 ENCOUNTER — Ambulatory Visit (INDEPENDENT_AMBULATORY_CARE_PROVIDER_SITE_OTHER): Payer: Medicare Other | Admitting: *Deleted

## 2011-04-28 ENCOUNTER — Ambulatory Visit (INDEPENDENT_AMBULATORY_CARE_PROVIDER_SITE_OTHER): Payer: Medicare Other | Admitting: Internal Medicine

## 2011-04-28 DIAGNOSIS — Z01810 Encounter for preprocedural cardiovascular examination: Secondary | ICD-10-CM

## 2011-04-28 DIAGNOSIS — I5022 Chronic systolic (congestive) heart failure: Secondary | ICD-10-CM

## 2011-04-28 DIAGNOSIS — I255 Ischemic cardiomyopathy: Secondary | ICD-10-CM

## 2011-04-28 DIAGNOSIS — I2589 Other forms of chronic ischemic heart disease: Secondary | ICD-10-CM

## 2011-04-28 DIAGNOSIS — I4891 Unspecified atrial fibrillation: Secondary | ICD-10-CM

## 2011-04-28 DIAGNOSIS — I2510 Atherosclerotic heart disease of native coronary artery without angina pectoris: Secondary | ICD-10-CM

## 2011-04-28 NOTE — Progress Notes (Signed)
HPI  Andrew [name] is a 78 y.o. male See at the request of Dr. Antoine Poche for consideration of device implantation.He's known to have a cardiomyopathy with previous ejection fraction to 25%. Most recently in the office a followup echo demonstrated his EF to be 15%.with diffuse hypokinesis and inferoposterior and lateral akinesis by echocardiogram in November.  He is scheduled for catheterization on Friday.  He has been hospitalized twice in the last couple of months for heart failure and pneumonia. Unworthy indications atrial fibrillation was identified.  He has moderate dyspnea on exertion, no orthopnea or PND. He has some peripheral edema and significant fatigue. He has not had palpitations or syncope.   Comorbidities which impacted this is related to his defibrillator include oxygen-dependent COPD renal insufficiency atrial fibrillation and his age. Past Medical History  Diagnosis Date  . LVF (left ventricular failure)     chronic LV systolic failure; last echo in December of 2011 showed EF of 25%.   . Ischemic heart disease   . Atrial flutter     off amiodarone because of increased LFTs  . Shortness of breath     chronic sob with known COPD and probable chronic bronchitis  . COPD (chronic obstructive pulmonary disease)   . Bronchitis, chronic   . Myocardial infarction 1994  . Gastro - esophageal reflux   . Claudication   . BPH (benign prostatic hyperplasia)   . CHF (congestive heart failure)   . Coronary artery disease     status post myocardial infarction in 1984,  99 and 07.  He has multiple RCA stents  . Hypertension   . Atrial fibrillation   . Hemorrhoids   . Hyperlipidemia   . Arrhythmia   . Hx of adenomatous colonic polyps   . Hx of colonoscopy    Past Medical History  Diagnosis Date  . LVF (left ventricular failure)     chronic LV systolic failure; last echo in December of 2011 showed EF of 25%.   . Ischemic heart disease   . Atrial flutter     off amiodarone  because of increased LFTs  . Shortness of breath     chronic sob with known COPD and probable chronic bronchitis  . COPD (chronic obstructive pulmonary disease)   . Bronchitis, chronic   . Myocardial infarction 1994  . Gastro - esophageal reflux   . Claudication   . BPH (benign prostatic hyperplasia)   . CHF (congestive heart failure)   . Coronary artery disease     status post myocardial infarction in 1984,  99 and 07.  He has multiple RCA stents  . Hypertension   . Atrial fibrillation   . Hemorrhoids   . Hyperlipidemia   . Arrhythmia   . Hx of adenomatous colonic polyps   . Hx of colonoscopy     Past Surgical History  Procedure Date  . Cardiac catheterization 2008  . Cataract extraction   . Salivary gland surgery   . Coronary angioplasty with stent placement     Current Outpatient Prescriptions  Medication Sig Dispense Refill  . acetaminophen (TYLENOL) 500 MG tablet Take 1,000 mg by mouth every 12 (twelve) hours as needed. For pain.      [name] albuterol (2.5 MG/3ML) 0.083% NEBU 3 mL, albuterol (5 MG/ML) 0.5% NEBU 0.5 mL Inhale 2.5 mg into the lungs 3 (three) times daily as needed. For shortness of breath.      . Albuterol Sulfate (PROAIR HFA IN) Inhale 2 puffs into the lungs  as needed. Shortness of breath.      . ALPRAZolam (XANAX) 0.25 MG tablet Take 0.25 mg by mouth at bedtime as needed. For anxiety.      . Alum & Mag Hydroxide-Simeth (MAALOX MAXIMUM STRENGTH PO) Take 30 mLs by mouth daily as needed. For indigestion      . calcium carbonate (TUMS EX) 750 MG chewable tablet Chew 3 tablets by mouth daily as needed. For upset stomach.      . carvedilol (COREG) 12.5 MG tablet Take 1 tablet (12.5 mg total) by mouth 2 (two) times daily with a meal.  60 tablet  6  . cefdinir (OMNICEF) 300 MG capsule Take 300 mg by mouth daily.      [name] esomeprazole (NEXIUM) 40 MG capsule Take 40 mg by mouth 2 (two) times daily.       . Fluticasone-Salmeterol (ADVAIR) 250-50 MCG/DOSE AEPB Inhale 1 puff  into the lungs every 12 (twelve) hours.       . furosemide (LASIX) 40 MG tablet Take 40-80 mg by mouth daily. 80 mg if weight >186      . HYDROcodone-acetaminophen (VICODIN) 5-500 MG per tablet Take 1 tablet by mouth every 12 (twelve) hours as needed. For pain.      [name] losartan (COZAAR) 50 MG tablet Take 1 tablet (50 mg total) by mouth 2 (two) times daily.  60 tablet  6  . losartan (COZAAR) 50 MG tablet Take 1 tablet (50 mg total) by mouth 2 (two) times daily.  60 tablet  6  . magnesium hydroxide (MILK OF MAGNESIA) 400 MG/5ML suspension Take 45 mLs by mouth daily as needed. For constipation.      . nitroGLYCERIN (NITROSTAT) 0.4 MG SL tablet Place 1 tablet (0.4 mg total) under the tongue every 5 (five) minutes as needed for chest pain.  25 tablet  6  . pravastatin (PRAVACHOL) 20 MG tablet Take 20 mg by mouth every evening.        . Prenatal Vit-Fe Fumarate-FA (PRENATAL MULTIVITAMIN) TABS Take 1 tablet by mouth every evening.        . Tamsulosin HCl (FLOMAX) 0.4 MG CAPS Take 0.4 mg by mouth 2 (two) times daily.       [name] DISCONTD: metoprolol tartrate (LOPRESSOR) 25 MG tablet Take 1/2 tablet every other day   45 tablet  3    Allergies  Allergen Reactions  . Codeine Nausea And Vomiting  . Sulfonamide Derivatives Itching and Rash    Review of Systems negative except from HPI and PMH  Physical Exam BP 130/68  Pulse 64  Ht 6' (1.829 m)  Wt 181 lb 1.9 oz (82.155 kg)  BMI 24.56 kg/m2 Well developed and well nourished in no acute distress HENT normal; edentulous with false teeth E scleral and icterus clear Neck Supple JVP flat; carotids brisk and full Clear to ausculation *Regular rate and rhythm, no murmurs gallops or rub: Displaced PMI Soft with active bowel sounds No clubbing cyanosis Trace Edema Alert and oriented, grossly normal motor and sensory function Skin Warm and Dry Affect engaging Lymph nodes negative  ECG from early January demonstrated sinus rhythm with an IVCD/left bundle  branch block QRS duration 156 Assessment and  Plan

## 2011-04-28 NOTE — Assessment & Plan Note (Signed)
The patient has ischemic cardiomyopathy and depressed left ventricular function. He has a very broad IVCD which is very left bundle branch like.  The role of an ICD given his ejection fraction we will take to be good. However, the benefits are attenuated by atrial arrhythmia, his age, his heart failure, and his renal dysfunction. I am not sure that an ICD makes sense to him.  However, the benefits of CRT-D a did not attenuate with age. His IVCD is quite left bundle branch block like his QRS is greater than 150/think is reasonable to undertake CRT. The patient is scheduled to undergo catheterization this Friday. Clearly any decisions regarding device implantation will need to be deferred until this time.  I also wonder whether he would be a candidate for aldosterone antagonism. He did not tolerate  Aldactone in the past. Potentially he could tolerate eplerenone

## 2011-04-28 NOTE — Assessment & Plan Note (Signed)
As above. I'm not sure what the plan is for anticoagulation but he clearly should be on long-term anticoagulation if at all possible

## 2011-04-28 NOTE — Assessment & Plan Note (Signed)
As above.

## 2011-04-29 LAB — CBC WITH DIFFERENTIAL/PLATELET
HCT: 32.8 % — ABNORMAL LOW (ref 39.0–52.0)
Hemoglobin: 11 g/dL — ABNORMAL LOW (ref 13.0–17.0)
Lymphocytes Relative: 11.4 % — ABNORMAL LOW (ref 12.0–46.0)
MCHC: 33.4 g/dL (ref 30.0–36.0)
RDW: 15.8 % — ABNORMAL HIGH (ref 11.5–14.6)

## 2011-04-29 LAB — BASIC METABOLIC PANEL
BUN: 23 mg/dL (ref 6–23)
CO2: 28 mEq/L (ref 19–32)
Chloride: 103 mEq/L (ref 96–112)
Glucose, Bld: 92 mg/dL (ref 70–99)
Potassium: 5.1 mEq/L (ref 3.5–5.1)
Sodium: 137 mEq/L (ref 135–145)

## 2011-04-29 LAB — PROTIME-INR: Prothrombin Time: 14 s — ABNORMAL HIGH (ref 10.2–12.4)

## 2011-04-30 ENCOUNTER — Other Ambulatory Visit: Payer: Self-pay | Admitting: Cardiology

## 2011-04-30 ENCOUNTER — Inpatient Hospital Stay (HOSPITAL_BASED_OUTPATIENT_CLINIC_OR_DEPARTMENT_OTHER)
Admission: RE | Admit: 2011-04-30 | Discharge: 2011-04-30 | Disposition: A | Discharge status: Home / Self Care | Payer: Medicare Other | Source: Ambulatory Visit | Attending: Cardiology | Admitting: Cardiology

## 2011-04-30 ENCOUNTER — Telehealth: Payer: Self-pay | Admitting: *Deleted

## 2011-04-30 ENCOUNTER — Encounter (HOSPITAL_BASED_OUTPATIENT_CLINIC_OR_DEPARTMENT_OTHER)
Admission: RE | Disposition: A | Discharge status: Home / Self Care | Payer: Self-pay | Source: Ambulatory Visit | Attending: Cardiology

## 2011-04-30 DIAGNOSIS — I509 Heart failure, unspecified: Secondary | ICD-10-CM

## 2011-04-30 DIAGNOSIS — I2589 Other forms of chronic ischemic heart disease: Secondary | ICD-10-CM

## 2011-04-30 DIAGNOSIS — R9439 Abnormal result of other cardiovascular function study: Secondary | ICD-10-CM | POA: Insufficient documentation

## 2011-04-30 SURGERY — JV LEFT AND RIGHT HEART CATHETERIZATION WITH CORONARY ANGIOGRAM
Anesthesia: Moderate Sedation

## 2011-04-30 MED ORDER — ONDANSETRON HCL 4 MG/2ML IJ SOLN: 4.0000 mg | Freq: Four times a day (QID) | INTRAMUSCULAR | Status: DC | PRN

## 2011-04-30 MED ORDER — EPLERENONE 25 MG PO TABS: 25.0000 mg | ORAL_TABLET | Freq: Every day | ORAL | Status: DC

## 2011-04-30 MED ORDER — ACETAMINOPHEN 325 MG PO TABS: 650.0000 mg | ORAL_TABLET | ORAL | Status: DC | PRN

## 2011-04-30 MED ORDER — SODIUM CHLORIDE 0.9 % IV SOLN: INTRAVENOUS | Status: DC

## 2011-04-30 NOTE — Telephone Encounter (Signed)
04/30/11--error--inpatient-nt

## 2011-04-30 NOTE — Procedures (Deleted)
  Cardiac Catheterization Procedure Note  Name: MCCLAIN SHALL MRN: [id number] DOB: 1931/01/04  Procedure: Left Heart Cath, Selective Coronary Angiography, LV angiography  Indication:   Abnormal Stress Test, preoperative exam, PVD   Procedural details: The right groin was prepped, draped, and anesthetized with 1% lidocaine. Using modified Seldinger technique, a 4 French sheath was introduced into the right femoral artery. Standard Judkins catheters were used for coronary angiography and left ventriculography. Catheter exchanges were performed over a guidewire. There were no immediate procedural complications. The patient was transferred to the post catheterization recovery area for further monitoring.  Procedural Findings:  Hemodynamics:     AO 126/12    LV 124/81   Coronary angiography:  Coronary dominance: Right  Left mainstem:   Separate ostia  Left anterior descending (LAD):   Proximal luminal irregularities. The first diagonal is small and normal.  The second diagonal moderate sized and branching and normal.  Left circumflex (LCx):  AV groove proximal scattered 25% lesions.  Large mid obtuse marginal normal.  Right coronary artery (RCA):  The right coronary artery is a large dominant vessel. There luminal irregularities in the proximal and mid segment. There is 25% stenosis before the PDA. PDA is small and normal. Posterior lateral is moderate size and normal.  Left ventriculography: Left ventricular systolic function is normal, LVEF is estimated at 55-65%, there is no significant mitral regurgitation   Final Conclusions:  Mild coronary plaque. Normal left ventricle function.  Recommendations: The patient has no obstructive coronary disease. He has a normal left ventricle. No further cardiovascular testing is suggested. He can continue with aggressive risk reduction. According to ACC/AHA guidelines he is at acceptable risk for the planned of vascular surgery.  Rollene Rotunda 04/30/2011, 11:31 AM

## 2011-04-30 NOTE — OR Nursing (Signed)
Dr Hochrein at bedside to discuss results and treatment plan with pt and family 

## 2011-04-30 NOTE — OR Nursing (Signed)
Meal served 

## 2011-04-30 NOTE — Interval H&P Note (Signed)
History and Physical Interval Note:  04/30/2011 9:32 AM  Andrew [name]  has presented today for surgery, with the diagnosis of cp  The various methods of treatment have been discussed with the patient and family. After consideration of risks, benefits and other options for treatment, the patient has consented to  Procedure(s): JV LEFT AND RIGHT HEART CATHETERIZATION WITH CORONARY ANGIOGRAM as a surgical intervention .  The patients' history has been reviewed, patient examined, no change in status, stable for surgery.  I have reviewed the patients' chart and labs.  Questions were answered to the patient's satisfaction.     Rollene Rotunda

## 2011-04-30 NOTE — Procedures (Signed)
  Cardiac Catheterization Procedure Note  Name: Andrew [name] MRN: [id number] DOB: April 04, 1931  Procedure: Right Heart Cath, Left Heart Cath, Selective Coronary Angiography, LV angiography  Indication:   Ischemic Cardiomoypathy   Procedural Details: The right groin was prepped, draped, and anesthetized with 1% lidocaine. Using the modified Seldinger technique a 5 French sheath was placed in the right femoral artery and a 7 French sheath was placed in the right femoral vein. A Swan-Ganz catheter was used for the right heart catheterization. Standard protocol was followed for recording of right heart pressures and sampling of oxygen saturations. Fick cardiac output was calculated. Standard Judkins catheters were used for selective coronary angiography and left ventriculography. There were no immediate procedural complications. The patient was transferred to the post catheterization recovery area for further monitoring.  Procedural Findings: Hemodynamics:               RA 5    RV 38/6    PA 36/26  Mean 24    PCWP 14    LV 162/17    AO 163/101   Oxygen saturations:    PA 56    AO 98   Cardiac Output (Fick) 4.1                               Cardiac Index (Fick) 2.0  Coronary angiography: Coronary dominance: right  Left mainstem: Ostial 25%  Left anterior descending (LAD): Diffuse proximal irregularities. Mid long 30% stenosis. First vaginal small and normal. Second diagonal small and normal. There is moderate proximal and mid calcification.  Left circumflex (LCx): AV groove diffuse luminal regularities. Proximal 25%. Small ramus intermediate normal. Versed obtuse marginal moderate sized and normal.  Right coronary artery (RCA): Very large. Previously stented. Proximal and mid stent is widely patent with mild luminal irregularities. Distal 30% defect. Distal 70% before a moderate size posterior lateral. The PDA is large to moderate-sized with proximal 25% stenosis. Posterior lateral is  large with long mid 25-30% stenosis.  Left ventriculography: Left ventricular systolic function is severely reduced LVEF is estimated at 25% with global hypokinesis and inferior akinesis  Final Conclusions:  Moderate RCA distal disease with patent RCA stent and non obstructive disease elsewhere.  Severe LV dysfunction.  Normal right heart pressures.  Recommendations: I will suggest medical management of his residual CAD and aggressive medical management of his LV dysfunction.  We are planning a probable BiV pacemaker.   Rollene Rotunda 04/30/2011, 9:35 AM

## 2011-04-30 NOTE — OR Nursing (Signed)
Discharge instructions reviewed and signed, pt stated understanding, ambulated in hall without difficulty, site intact level 0, transported to wife's car via wheelchair

## 2011-04-30 NOTE — H&P (View-Only) (Signed)
 HPI The patient presents for followup of his known coronary disease and cardiomyopathy.  He was hospitalized twice in December. The first was for some speech difficulties and though there was no clear diagnosis of a CVA he was put on Rivaroxaban.  He was hospitalized later with questionable pneumonia, COPD flare and some component of a heart failure exacerbation. He's known to have a cardiomyopathy with previous ejection fraction to 25%. Most recently in the office a followup echo demonstrated his EF to be 15%. He is being referred to Dr. Klein for consideration of a defibrillator. Since being discharged from the hospital he has had fatigue. He's had some episodic dyspnea. He's had home O2 for a long time and he's been wearing this more frequently. He's not describing any new PND or orthopnea. He's not been having any palpitations, presyncope or syncope. He thinks his weights are stable and he's had no new edema. He is watching his salt. He takes his medications as listed. Of note he was switched from metoprolol to carvedilol in the hospital. I tried to up titrate beta blocker in the past but he didn't tolerate it. His dose of Cozaar was reduced apparently secondary to hypotension. He says his blood pressures have been drifting back up.  Allergies  Allergen Reactions  . Codeine Nausea And Vomiting  . Sulfonamide Derivatives Itching and Rash    Current Outpatient Prescriptions  Medication Sig Dispense Refill  . acetaminophen (TYLENOL) 500 MG tablet Take 1,000 mg by mouth every 12 (twelve) hours as needed. For pain.      . albuterol (2.5 MG/3ML) 0.083% NEBU 3 mL, albuterol (5 MG/ML) 0.5% NEBU 0.5 mL Inhale 2.5 mg into the lungs 3 (three) times daily as needed. For shortness of breath.      . Albuterol Sulfate (PROAIR HFA IN) Inhale 2 puffs into the lungs as needed. Shortness of breath.      . ALPRAZolam (XANAX) 0.25 MG tablet Take 0.25 mg by mouth at bedtime as needed. For anxiety.      . Alum & Mag  Hydroxide-Simeth (MAALOX MAXIMUM STRENGTH PO) Take 30 mLs by mouth daily as needed. For constipation      . calcium carbonate (TUMS EX) 750 MG chewable tablet Chew 3 tablets by mouth daily as needed. For upset stomach.      . carvedilol (COREG) 12.5 MG tablet Take 1 tablet (12.5 mg total) by mouth 2 (two) times daily with a meal.  30 tablet  0  . esomeprazole (NEXIUM) 40 MG capsule Take 40 mg by mouth 2 (two) times daily.       . Fluticasone-Salmeterol (ADVAIR) 250-50 MCG/DOSE AEPB Inhale 1 puff into the lungs every 12 (twelve) hours.       . furosemide (LASIX) 40 MG tablet Take 40-80 mg by mouth daily. 80 mg if weight >186      . HYDROcodone-acetaminophen (VICODIN) 5-500 MG per tablet Take 1 tablet by mouth every 12 (twelve) hours as needed. For pain.      . losartan (COZAAR) 50 MG tablet Take 1 tablet (50 mg total) by mouth daily.  30 tablet  0  . magnesium hydroxide (MILK OF MAGNESIA) 400 MG/5ML suspension Take 45 mLs by mouth daily as needed. For constipation.      . meclizine (ANTIVERT) 25 MG tablet Take 25 mg by mouth 3 (three) times daily as needed. for dizziness.      . nitroGLYCERIN (NITROSTAT) 0.4 MG SL tablet Place 1 tablet (0.4 mg total) under   the tongue every 5 (five) minutes as needed for chest pain.  25 tablet  6  . pravastatin (PRAVACHOL) 20 MG tablet Take 20 mg by mouth every evening.        . Prenatal Vit-Fe Fumarate-FA (PRENATAL MULTIVITAMIN) TABS Take 1 tablet by mouth every evening.        . Rivaroxaban 20 MG TABS Take 20 mg by mouth daily with supper.        . Simethicone (GAS-X ULTRA STRENGTH PO) Take 2 tablets by mouth as needed. For indigestion      . Tamsulosin HCl (FLOMAX) 0.4 MG CAPS Take 0.4 mg by mouth 2 (two) times daily.       . DISCONTD: metoprolol tartrate (LOPRESSOR) 25 MG tablet Take 1/2 tablet every other day   45 tablet  3    Past Medical History  Diagnosis Date  . LVF (left ventricular failure)     chronic LV systolic failure; last echo in December of  2011 showed EF of 25%.   . Ischemic heart disease   . Atrial flutter     off amiodarone because of increased LFTs  . Shortness of breath     chronic sob with known COPD and probable chronic bronchitis  . COPD (chronic obstructive pulmonary disease)   . Bronchitis, chronic   . Myocardial infarction 1994  . Gastro - esophageal reflux   . Claudication   . BPH (benign prostatic hyperplasia)   . CHF (congestive heart failure)   . Coronary artery disease     status post myocardial infarction in 1984,  99 and 07.  He has multiple RCA stents  . Hypertension   . Atrial fibrillation   . Hemorrhoids   . Hyperlipidemia   . Arrhythmia   . Hx of adenomatous colonic polyps   . Hx of colonoscopy     Past Surgical History  Procedure Date  . Cardiac catheterization 2008  . Cataract extraction   . Salivary gland surgery   . Coronary angioplasty with stent placement     ROS:  As stated in the HPI and negative for all other systems.  PHYSICAL EXAM BP 149/77  Pulse 46  Ht 6' (1.829 m)  Wt 187 lb (84.823 kg)  BMI 25.36 kg/m2 GENERAL:  Frail appearing HEENT:  Pupils equal round and reactive, fundi not visualized, oral mucosa unremarkable, dentures NECK:  No jugular venous distention, waveform within normal limits, carotid upstroke brisk and symmetric, no bruits, no thyromegaly LYMPHATICS:  No cervical, inguinal adenopathy LUNGS:  Clear to auscultation bilaterally BACK:  No CVA tenderness CHEST:  Unremarkable HEART:  PMI not displaced or sustained,S1 and S2 within normal limits, no S3, no S4, no clicks, no rubs, no murmurs ABD:  Flat, positive bowel sounds normal in frequency in pitch, no bruits, no rebound, no guarding, no midline pulsatile mass, no hepatomegaly, no splenomegaly EXT:  2 plus pulses throughout, no edema, no cyanosis no clubbing, chronic venous stasis changes. SKIN:  No rashes no nodules, bruising NEURO:  Cranial nerves II through XII grossly intact, motor grossly intact  throughout PSYCH:  Cognitively intact, oriented to person place and time  ASSESSMENT AND PLAN  

## 2011-04-30 NOTE — OR Nursing (Signed)
Tegaderm dressing applied, site level 0, bedrest began at 0950

## 2011-05-03 LAB — POCT I-STAT 3, VENOUS BLOOD GAS (G3P V)
Acid-base deficit: 2 mmol/L (ref 0.0–2.0)
Bicarbonate: 23.7 mEq/L (ref 20.0–24.0)
TCO2: 25 mmol/L (ref 0–100)
pO2, Ven: 32 mmHg (ref 30.0–45.0)

## 2011-05-04 ENCOUNTER — Telehealth: Payer: Self-pay | Admitting: *Deleted

## 2011-05-04 DIAGNOSIS — I10 Essential (primary) hypertension: Secondary | ICD-10-CM

## 2011-05-04 NOTE — Telephone Encounter (Signed)
Pt needs basic metabolic panel in 1 week.

## 2011-05-04 NOTE — Telephone Encounter (Signed)
Message copied by Sharin Grave on Tue May 04, 2011  9:20 AM ------      Message from: Rollene Rotunda      Created: Fri Apr 30, 2011 11:45 AM       I think that I sent in a prescription for Eplerenone on this patient.  25 mg daily.  He needs a BMET in one week.

## 2011-05-07 ENCOUNTER — Other Ambulatory Visit (INDEPENDENT_AMBULATORY_CARE_PROVIDER_SITE_OTHER): Payer: Medicare Other | Admitting: *Deleted

## 2011-05-07 DIAGNOSIS — I10 Essential (primary) hypertension: Secondary | ICD-10-CM

## 2011-05-07 LAB — BASIC METABOLIC PANEL
BUN: 23 mg/dL (ref 6–23)
Chloride: 105 mEq/L (ref 96–112)
Creatinine, Ser: 1.2 mg/dL (ref 0.4–1.5)
GFR: 63.93 mL/min (ref >60.00)
Glucose, Bld: 91 mg/dL (ref 70–99)

## 2011-05-13 ENCOUNTER — Other Ambulatory Visit: Payer: Self-pay | Admitting: Cardiology

## 2011-05-13 DIAGNOSIS — I509 Heart failure, unspecified: Secondary | ICD-10-CM

## 2011-05-13 MED ORDER — EPLERENONE 25 MG PO TABS: 25.0000 mg | ORAL_TABLET | Freq: Every day | ORAL | Status: DC

## 2011-05-13 MED ORDER — CARVEDILOL 12.5 MG PO TABS: 12.5000 mg | ORAL_TABLET | Freq: Two times a day (BID) | ORAL | Status: DC

## 2011-05-13 MED ORDER — LOSARTAN POTASSIUM 50 MG PO TABS: 50.0000 mg | ORAL_TABLET | Freq: Two times a day (BID) | ORAL | Status: DC

## 2011-05-14 ENCOUNTER — Telehealth: Payer: Self-pay | Admitting: Cardiology

## 2011-05-14 ENCOUNTER — Other Ambulatory Visit: Payer: Self-pay | Admitting: *Deleted

## 2011-05-14 MED ORDER — ESOMEPRAZOLE MAGNESIUM 40 MG PO CPDR: 40.0000 mg | DELAYED_RELEASE_CAPSULE | Freq: Two times a day (BID) | ORAL | Status: DC

## 2011-05-14 NOTE — Telephone Encounter (Signed)
New Problem   Patient called for the status of documentation and medication instructions for Nexium.  Please return call to patient at hm#

## 2011-05-14 NOTE — Telephone Encounter (Signed)
No letter in system for prior authorization on Nexium.  Advised patient will look into seeing if form completed and faxed back.  If unable to find out will have to forward to [facility] RN with Dr Antoine Poche for when she returns

## 2011-05-17 ENCOUNTER — Telehealth: Payer: Self-pay | Admitting: *Deleted

## 2011-05-17 NOTE — Telephone Encounter (Signed)
Left message for pt that I did speak with Andrew [name] at [facility] about prior authorization for his Nexium 40 mg BID.  She states that this medication is covered for him BID on his plan.  The pt should call back to member services if he is having trouble getting it refilled.

## 2011-05-18 ENCOUNTER — Telehealth: Payer: Self-pay | Admitting: Cardiology

## 2011-05-18 NOTE — Telephone Encounter (Signed)
FU Call: Pharmacy calling regarding carvedilol duplicate therapy. Please return call to discuss further.  Ref # [id number]

## 2011-05-18 NOTE — Telephone Encounter (Signed)
New msg Pt called Its about a rx - he thinks its nexium. He wants somone to call [facility]- ref number [id number] at  [phone] to find out why they wont fill this rx

## 2011-05-18 NOTE — Telephone Encounter (Signed)
Will forward to Dr. Hochrein's nurse. 

## 2011-05-21 MED ORDER — RIVAROXABAN 20 MG PO TABS: 20.0000 mg | ORAL_TABLET | Freq: Every day | ORAL | Status: DC

## 2011-05-21 NOTE — Telephone Encounter (Signed)
Spoke with [facility] who states that Nexium is approved for BID with refills for a year.  Called pt to inform him and he says he wasn't the nexium he needed but his Xarelto.  Rx will be sent into pharmacy as requested

## 2011-05-21 NOTE — Telephone Encounter (Signed)
F/U  Patient calling for stats on previous nexium request.  Please return call to patient at hm#

## 2011-05-27 ENCOUNTER — Telehealth: Payer: Self-pay | Admitting: Internal Medicine

## 2011-05-27 NOTE — Telephone Encounter (Signed)
New Problem   Please return call to patient wife Gerald Leitz [phone], concerning the next step for patient who had cath on 1/25.

## 2011-05-27 NOTE — Telephone Encounter (Signed)
Patient's wife called to find out about a call from Dr. Graciela Husbands he/she should have had received for possible Pacer Implant. Patient had a cardiac cath on 04/30/11. Post- cath OV made for pt on 06/04/11 at 11:00 AM. Wife  will make an appointment with Dr. Graciela Husbands when pt. Comes to see the PA on 06/04/11.

## 2011-06-03 ENCOUNTER — Ambulatory Visit (INDEPENDENT_AMBULATORY_CARE_PROVIDER_SITE_OTHER): Payer: Medicare Other | Admitting: Cardiology

## 2011-06-03 ENCOUNTER — Encounter: Payer: Self-pay | Admitting: Cardiology

## 2011-06-03 ENCOUNTER — Encounter: Payer: Self-pay | Admitting: *Deleted

## 2011-06-03 ENCOUNTER — Ambulatory Visit: Payer: Medicare Other | Admitting: Physician Assistant

## 2011-06-03 ENCOUNTER — Telehealth: Payer: Self-pay | Admitting: Internal Medicine

## 2011-06-03 DIAGNOSIS — I509 Heart failure, unspecified: Secondary | ICD-10-CM

## 2011-06-03 DIAGNOSIS — I4891 Unspecified atrial fibrillation: Secondary | ICD-10-CM

## 2011-06-03 DIAGNOSIS — I255 Ischemic cardiomyopathy: Secondary | ICD-10-CM

## 2011-06-03 DIAGNOSIS — I10 Essential (primary) hypertension: Secondary | ICD-10-CM

## 2011-06-03 DIAGNOSIS — I2589 Other forms of chronic ischemic heart disease: Secondary | ICD-10-CM

## 2011-06-03 NOTE — Progress Notes (Signed)
HPI The patient presents for followup of his known coronary disease and cardiomyopathy.  He is being evaluated for CRT.  He had a cath and was found to have mild coronary plaque with a patent stent.  His EF was 25%.  With that catheterization he had no significant problems. He thinks that since pneumonia is quite last year he is actually an improvement in his breathing. He's not describing any new cough, PND or orthopnea. He's not been having any chest pressure, neck or arm discomfort. He still is using oxygen at night.  Of note I started him on eplerenone but he did not tolerate this.  He also developed the muscle aches on pravachol.   Allergies  Allergen Reactions  . Codeine Nausea And Vomiting  . Sulfonamide Derivatives Itching and Rash    Current Outpatient Prescriptions  Medication Sig Dispense Refill  . acetaminophen (TYLENOL) 500 MG tablet Take 1,000 mg by mouth every 12 (twelve) hours as needed. For pain.      [name] albuterol (2.5 MG/3ML) 0.083% NEBU 3 mL, albuterol (5 MG/ML) 0.5% NEBU 0.5 mL Inhale 2.5 mg into the lungs 3 (three) times daily as needed. For shortness of breath.      . Albuterol Sulfate (PROAIR HFA IN) Inhale 2 puffs into the lungs as needed. Shortness of breath.      . ALPRAZolam (XANAX) 0.25 MG tablet Take 0.25 mg by mouth at bedtime as needed. For anxiety.      . Alum & Mag Hydroxide-Simeth (MAALOX MAXIMUM STRENGTH PO) Take 30 mLs by mouth daily as needed. For indigestion      . calcium carbonate (TUMS EX) 750 MG chewable tablet Chew 3 tablets by mouth daily as needed. For upset stomach.      . carvedilol (COREG) 12.5 MG tablet Take 1 tablet (12.5 mg total) by mouth 2 (two) times daily with a meal.  180 tablet  1  . cefdinir (OMNICEF) 300 MG capsule Take 300 mg by mouth daily.      . Fluticasone-Salmeterol (ADVAIR) 250-50 MCG/DOSE AEPB Inhale 1 puff into the lungs every 12 (twelve) hours.       . furosemide (LASIX) 40 MG tablet Take 40-80 mg by mouth daily. 80 mg if  weight >186      . HYDROcodone-acetaminophen (VICODIN) 5-500 MG per tablet Take 1 tablet by mouth every 12 (twelve) hours as needed. For pain.      [name] losartan (COZAAR) 50 MG tablet Take 1 tablet (50 mg total) by mouth 2 (two) times daily.  180 tablet  1  . magnesium hydroxide (MILK OF MAGNESIA) 400 MG/5ML suspension Take 45 mLs by mouth daily as needed. For constipation.      . nitroGLYCERIN (NITROSTAT) 0.4 MG SL tablet Place 1 tablet (0.4 mg total) under the tongue every 5 (five) minutes as needed for chest pain.  25 tablet  6  . Prenatal Vit-Fe Fumarate-FA (PRENATAL MULTIVITAMIN) TABS Take 1 tablet by mouth every evening.        . Rivaroxaban (XARELTO) 20 MG TABS Take 20 mg by mouth daily.  90 tablet  3  . Tamsulosin HCl (FLOMAX) 0.4 MG CAPS Take 0.4 mg by mouth 2 (two) times daily.       [name] DISCONTD: metoprolol tartrate (LOPRESSOR) 25 MG tablet Take 1/2 tablet every other day   45 tablet  3    Past Medical History  Diagnosis Date  . LVF (left ventricular failure)     chronic LV systolic failure;  last echo in December of 2011 showed EF of 25%.   . Ischemic heart disease   . Atrial flutter     off amiodarone because of increased LFTs  . Shortness of breath     chronic sob with known COPD and probable chronic bronchitis  . COPD (chronic obstructive pulmonary disease)   . Bronchitis, chronic   . Myocardial infarction 1994  . Gastro - esophageal reflux   . Claudication   . BPH (benign prostatic hyperplasia)   . CHF (congestive heart failure)   . Coronary artery disease     status post myocardial infarction in 1984,  99 and 07.  He has multiple RCA stents  . Hypertension   . Atrial fibrillation   . Hemorrhoids   . Hyperlipidemia   . Arrhythmia   . Hx of adenomatous colonic polyps   . Hx of colonoscopy     Past Surgical History  Procedure Date  . Cardiac catheterization 2008  . Cataract extraction   . Salivary gland surgery   . Coronary angioplasty with stent placement      ROS:  As stated in the HPI and negative for all other systems.  PHYSICAL EXAM BP 110/55  Pulse 72  Ht 6' (1.829 m)  Wt 190 lb (86.183 kg)  BMI 25.77 kg/m2 GENERAL:  Frail appearing HEENT:  Pupils equal round and reactive, fundi not visualized, oral mucosa unremarkable, dentures NECK:  No jugular venous distention, waveform within normal limits, carotid upstroke brisk and symmetric, no bruits, no thyromegaly LYMPHATICS:  No cervical, inguinal adenopathy LUNGS:  Clear to auscultation bilaterally BACK:  No CVA tenderness CHEST:  Unremarkable HEART:  PMI not displaced or sustained,S1 and S2 within normal limits, no S3, no S4, no clicks, no rubs, no murmurs ABD:  Flat, positive bowel sounds normal in frequency in pitch, no bruits, no rebound, no guarding, no midline pulsatile mass, no hepatomegaly, no splenomegaly EXT:  2 plus pulses throughout, no edema, no cyanosis no clubbing, chronic venous stasis changes. SKIN:  Bruising  ASSESSMENT AND PLAN

## 2011-06-03 NOTE — Telephone Encounter (Signed)
I spoke with Andrew [name]. The patient is scheduled for 06/16/11 at 10:30 am for his procedure. He will come on 3/6 for his labwork to be done.

## 2011-06-03 NOTE — Assessment & Plan Note (Signed)
He continues on anticoagulation.   

## 2011-06-03 NOTE — Telephone Encounter (Signed)
Fu call Pt's wife was calling back with date Please call

## 2011-06-03 NOTE — Assessment & Plan Note (Signed)
I have asked Dr. Odessa Fleming team to call him to discuss the planned procedure.  Otherwise, he will remain on the meds as listed.

## 2011-06-03 NOTE — Assessment & Plan Note (Signed)
The blood pressure is at target. No change in medications is indicated. We will continue with therapeutic lifestyle changes (TLC).  

## 2011-06-04 ENCOUNTER — Encounter: Payer: Medicare Other | Admitting: Physician Assistant

## 2011-06-04 ENCOUNTER — Encounter (HOSPITAL_COMMUNITY): Payer: Self-pay | Admitting: Pharmacy Technician

## 2011-06-04 DIAGNOSIS — Z950 Presence of cardiac pacemaker: Secondary | ICD-10-CM

## 2011-06-04 HISTORY — DX: Presence of cardiac pacemaker: Z95.0

## 2011-06-08 ENCOUNTER — Other Ambulatory Visit: Payer: Self-pay | Admitting: Internal Medicine

## 2011-06-08 DIAGNOSIS — I5022 Chronic systolic (congestive) heart failure: Secondary | ICD-10-CM

## 2011-06-09 ENCOUNTER — Other Ambulatory Visit (INDEPENDENT_AMBULATORY_CARE_PROVIDER_SITE_OTHER): Payer: Medicare Other

## 2011-06-09 DIAGNOSIS — I2589 Other forms of chronic ischemic heart disease: Secondary | ICD-10-CM

## 2011-06-09 DIAGNOSIS — I255 Ischemic cardiomyopathy: Secondary | ICD-10-CM

## 2011-06-09 DIAGNOSIS — I509 Heart failure, unspecified: Secondary | ICD-10-CM

## 2011-06-09 LAB — CBC WITH DIFFERENTIAL/PLATELET
Basophils Absolute: 0 10*3/uL (ref 0.0–0.1)
Eosinophils Absolute: 0.4 10*3/uL (ref 0.0–0.7)
Eosinophils Relative: 5.1 % — ABNORMAL HIGH (ref 0.0–5.0)
HCT: 35.4 % — ABNORMAL LOW (ref 39.0–52.0)
Lymphs Abs: 0.9 10*3/uL (ref 0.7–4.0)
MCHC: 32.9 g/dL (ref 30.0–36.0)
MCV: 95.2 fl (ref 78.0–100.0)
Monocytes Absolute: 0.7 10*3/uL (ref 0.1–1.0)
Neutrophils Relative %: 75.3 % (ref 43.0–77.0)
Platelets: 234 10*3/uL (ref 150.0–400.0)
RDW: 15.2 % — ABNORMAL HIGH (ref 11.5–14.6)

## 2011-06-09 LAB — BASIC METABOLIC PANEL
Calcium: 9 mg/dL (ref 8.4–10.5)
GFR: 65.2 mL/min (ref >60.00)
Glucose, Bld: 82 mg/dL (ref 70–99)
Potassium: 3.8 mEq/L (ref 3.5–5.1)
Sodium: 139 mEq/L (ref 135–145)

## 2011-06-10 ENCOUNTER — Telehealth: Payer: Self-pay | Admitting: Internal Medicine

## 2011-06-10 NOTE — Telephone Encounter (Signed)
Pt calling to see if surgery precerted with insurance

## 2011-06-10 NOTE — Telephone Encounter (Signed)
I spoke with the patient. He is aware his precert is in process.

## 2011-06-14 NOTE — Telephone Encounter (Signed)
Fu call Pt's wife wanted to know if heard from ins about procedure scheduled for wed

## 2011-06-14 NOTE — Telephone Encounter (Signed)
Per Charmaine, waiting on call back from the patient's insurance. He is aware.

## 2011-06-14 NOTE — Telephone Encounter (Signed)
Per Charmaine. ICD implant has been authorized by insurance. The patient is aware.

## 2011-06-15 MED ORDER — SODIUM CHLORIDE 0.9 % IR SOLN
80.0000 mg | Status: DC
  Filled 2011-06-15: qty 2

## 2011-06-15 MED ORDER — CEFAZOLIN SODIUM-DEXTROSE 2-3 GM-% IV SOLR
2.0000 g | INTRAVENOUS | Status: DC
  Filled 2011-06-15: qty 50

## 2011-06-16 ENCOUNTER — Ambulatory Visit (HOSPITAL_COMMUNITY)
Admission: RE | Admit: 2011-06-16 | Discharge: 2011-06-17 | Disposition: A | Discharge status: Home / Self Care | Payer: Medicare Other | Source: Ambulatory Visit | Attending: Internal Medicine | Admitting: Internal Medicine

## 2011-06-16 ENCOUNTER — Encounter (HOSPITAL_COMMUNITY)
Admission: RE | Disposition: A | Discharge status: Home / Self Care | Payer: Self-pay | Source: Ambulatory Visit | Attending: Internal Medicine

## 2011-06-16 ENCOUNTER — Encounter (HOSPITAL_COMMUNITY): Payer: Self-pay | Admitting: General Practice

## 2011-06-16 DIAGNOSIS — I5022 Chronic systolic (congestive) heart failure: Secondary | ICD-10-CM

## 2011-06-16 DIAGNOSIS — Z950 Presence of cardiac pacemaker: Secondary | ICD-10-CM

## 2011-06-16 DIAGNOSIS — I447 Left bundle-branch block, unspecified: Secondary | ICD-10-CM | POA: Insufficient documentation

## 2011-06-16 DIAGNOSIS — I509 Heart failure, unspecified: Secondary | ICD-10-CM

## 2011-06-16 DIAGNOSIS — I428 Other cardiomyopathies: Secondary | ICD-10-CM | POA: Insufficient documentation

## 2011-06-16 DIAGNOSIS — I255 Ischemic cardiomyopathy: Secondary | ICD-10-CM | POA: Insufficient documentation

## 2011-06-16 HISTORY — DX: Chronic kidney disease, unspecified: N18.9

## 2011-06-16 HISTORY — PX: BI-VENTRICULAR PACEMAKER INSERTION: SHX5462

## 2011-06-16 HISTORY — PX: INSERT / REPLACE / REMOVE PACEMAKER: SUR710

## 2011-06-16 LAB — PROTIME-INR: INR: 1.52 — ABNORMAL HIGH (ref 0.00–1.49)

## 2011-06-16 LAB — APTT: aPTT: 34 seconds (ref 24–37)

## 2011-06-16 SURGERY — BI-VENTRICULAR PACEMAKER INSERTION (CRT-P)
Anesthesia: LOCAL

## 2011-06-16 MED ORDER — ALBUTEROL SULFATE HFA 108 (90 BASE) MCG/ACT IN AERS
1.0000 | INHALATION_SPRAY | Freq: Four times a day (QID) | RESPIRATORY_TRACT | Status: DC
  Administered 2011-06-16 – 2011-06-17 (×4): 1 via RESPIRATORY_TRACT
  Filled 2011-06-16: qty 6.7

## 2011-06-16 MED ORDER — LOSARTAN POTASSIUM 50 MG PO TABS
50.0000 mg | ORAL_TABLET | Freq: Two times a day (BID) | ORAL | Status: DC
  Administered 2011-06-16 – 2011-06-17 (×2): 50 mg via ORAL
  Filled 2011-06-16 (×3): qty 1

## 2011-06-16 MED ORDER — HYDROCODONE-ACETAMINOPHEN 5-500 MG PO TABS: 1.0000 | ORAL_TABLET | Freq: Two times a day (BID) | ORAL | Status: DC | PRN

## 2011-06-16 MED ORDER — FENTANYL CITRATE 0.05 MG/ML IJ SOLN
INTRAMUSCULAR | Status: AC
  Filled 2011-06-16: qty 2

## 2011-06-16 MED ORDER — HEPARIN (PORCINE) IN NACL 2-0.9 UNIT/ML-% IJ SOLN
INTRAMUSCULAR | Status: AC
  Filled 2011-06-16: qty 1000

## 2011-06-16 MED ORDER — LIDOCAINE HCL (PF) 1 % IJ SOLN
INTRAMUSCULAR | Status: AC
  Filled 2011-06-16: qty 60

## 2011-06-16 MED ORDER — MIDAZOLAM HCL 5 MG/5ML IJ SOLN
INTRAMUSCULAR | Status: AC
  Filled 2011-06-16: qty 5

## 2011-06-16 MED ORDER — YOU HAVE A PACEMAKER BOOK
Freq: Once | Status: AC
  Administered 2011-06-16: 18:00:00
  Filled 2011-06-16: qty 1

## 2011-06-16 MED ORDER — CEFAZOLIN SODIUM 1-5 GM-% IV SOLN
1.0000 g | Freq: Four times a day (QID) | INTRAVENOUS | Status: AC
  Administered 2011-06-17 (×2): 1 g via INTRAVENOUS
  Filled 2011-06-16 (×3): qty 50

## 2011-06-16 MED ORDER — ACETAMINOPHEN 325 MG PO TABS
325.0000 mg | ORAL_TABLET | ORAL | Status: DC | PRN
  Administered 2011-06-17: 650 mg via ORAL
  Filled 2011-06-16: qty 2

## 2011-06-16 MED ORDER — SODIUM CHLORIDE 0.45 % IV SOLN
INTRAVENOUS | Status: DC
  Administered 2011-06-16: 09:00:00 via INTRAVENOUS

## 2011-06-16 MED ORDER — TAMSULOSIN HCL 0.4 MG PO CAPS
0.4000 mg | ORAL_CAPSULE | Freq: Two times a day (BID) | ORAL | Status: DC
  Administered 2011-06-16 – 2011-06-17 (×2): 0.4 mg via ORAL
  Filled 2011-06-16 (×3): qty 1

## 2011-06-16 MED ORDER — SODIUM CHLORIDE 0.9 % IV SOLN
INTRAVENOUS | Status: DC
  Administered 2011-06-16: 09:00:00 via INTRAVENOUS

## 2011-06-16 MED ORDER — HYDROCODONE-ACETAMINOPHEN 5-325 MG PO TABS
1.0000 | ORAL_TABLET | Freq: Two times a day (BID) | ORAL | Status: DC | PRN
  Administered 2011-06-16 – 2011-06-17 (×2): 1 via ORAL
  Filled 2011-06-16 (×2): qty 1

## 2011-06-16 MED ORDER — LIDOCAINE HCL (PF) 1 % IJ SOLN
INTRAMUSCULAR | Status: AC
  Filled 2011-06-16: qty 30

## 2011-06-16 MED ORDER — NITROGLYCERIN 0.4 MG SL SUBL: 0.4000 mg | SUBLINGUAL_TABLET | SUBLINGUAL | Status: DC | PRN

## 2011-06-16 MED ORDER — PANTOPRAZOLE SODIUM 40 MG PO TBEC
40.0000 mg | DELAYED_RELEASE_TABLET | Freq: Every day | ORAL | Status: DC
  Administered 2011-06-17: 40 mg via ORAL
  Filled 2011-06-16: qty 1

## 2011-06-16 MED ORDER — MUPIROCIN 2 % EX OINT
TOPICAL_OINTMENT | Freq: Once | CUTANEOUS | Status: AC
  Administered 2011-06-16: 1 via NASAL
  Filled 2011-06-16: qty 22

## 2011-06-16 MED ORDER — ONDANSETRON HCL 4 MG/2ML IJ SOLN: 4.0000 mg | Freq: Four times a day (QID) | INTRAMUSCULAR | Status: DC | PRN

## 2011-06-16 MED ORDER — FLUTICASONE-SALMETEROL 250-50 MCG/DOSE IN AEPB
1.0000 | INHALATION_SPRAY | Freq: Two times a day (BID) | RESPIRATORY_TRACT | Status: DC
  Filled 2011-06-16: qty 14

## 2011-06-16 MED ORDER — FUROSEMIDE 40 MG PO TABS
40.0000 mg | ORAL_TABLET | Freq: Every day | ORAL | Status: DC
  Administered 2011-06-17: 40 mg via ORAL
  Filled 2011-06-16 (×2): qty 2

## 2011-06-16 MED ORDER — PRENATAL MULTIVITAMIN CH
1.0000 | ORAL_TABLET | Freq: Every evening | ORAL | Status: DC
  Administered 2011-06-16: 1 via ORAL
  Filled 2011-06-16 (×2): qty 1

## 2011-06-16 MED ORDER — CARVEDILOL 12.5 MG PO TABS
12.5000 mg | ORAL_TABLET | Freq: Two times a day (BID) | ORAL | Status: DC
Start: 2011-06-16 — End: 2011-06-17
  Administered 2011-06-16 – 2011-06-17 (×2): 12.5 mg via ORAL
  Filled 2011-06-16 (×3): qty 1

## 2011-06-16 MED ORDER — ALPRAZOLAM 0.25 MG PO TABS
0.2500 mg | ORAL_TABLET | Freq: Every evening | ORAL | Status: DC | PRN
  Administered 2011-06-17: 0.25 mg via ORAL
  Filled 2011-06-16: qty 1

## 2011-06-16 MED ORDER — CEFAZOLIN SODIUM 1-5 GM-% IV SOLN
1.0000 g | Freq: Four times a day (QID) | INTRAVENOUS | Status: DC
  Administered 2011-06-16: 1 g via INTRAVENOUS
  Filled 2011-06-16 (×3): qty 50

## 2011-06-16 NOTE — Interval H&P Note (Signed)
History and Physical Interval Note:  06/16/2011 10:28 AM  Andrew [name]  has presented today for surgery, with the diagnosis of Heart failure  The various methods of treatment have been discussed with the patient and family. After consideration of risks, benefits and other options for treatment, the patient has consented to  Procedure(s) (LRB): BI-VENTRICULAR PACEMAKER INSERTION (CRT-P) (N/A) as a surgical intervention .  The patients' history has been reviewed, patient examined, no change in status, stable for surgery.  I have reviewed the patients' chart and labs.  Questions were answered to the patient's satisfaction.     Sherryl Manges  I have reviewed ejection fraction with Dr [place]  prob 25-30%  Will proceed with CRT-P   CR 1.0

## 2011-06-16 NOTE — H&P (Signed)
HPI  Andrew [name] is a 78 y.o. male known to have a cardiomyopathy with previous ejection fraction to 25%. Most recently in the office a followup echo demonstrated his EF to be 15%.with diffuse hypokinesis and inferoposterior and lateral akinesis by echocardiogram in November.  Cath dem:: Mild coronary plaque. Normal left ventricle function.  This was distinct for echo >>15%  We need to clarify prior to proceeding      Comorbidities which impacted decision regarding defibrillator include oxygen-dependent COPD renal insufficiency atrial fibrillation and his age.   Past Medical History  Diagnosis Date  . LVF (left ventricular failure)     chronic LV systolic failure; last echo in December of 2011 showed EF of 25%.   . Ischemic heart disease   . Atrial flutter     off amiodarone because of increased LFTs  . Shortness of breath     chronic sob with known COPD and probable chronic bronchitis  . COPD (chronic obstructive pulmonary disease)   . Bronchitis, chronic   . Myocardial infarction 1994  . Gastro - esophageal reflux   . Claudication   . BPH (benign prostatic hyperplasia)   . CHF (congestive heart failure)   . Coronary artery disease     status post myocardial infarction in 1984,  99 and 07.  He has multiple RCA stents  . Hypertension   . Atrial fibrillation   . Hemorrhoids   . Hyperlipidemia   . Arrhythmia   . Hx of adenomatous colonic polyps   . Hx of colonoscopy     Past Surgical History  Procedure Date  . Cardiac catheterization 2008  . Cataract extraction   . Salivary gland surgery   . Coronary angioplasty with stent placement     Current Facility-Administered Medications  Medication Dose Route Frequency Provider Last Rate Last Dose  . 0.45 % sodium chloride infusion   Intravenous Continuous Duke Salvia, MD 50 mL/hr at 06/16/11 0911    . 0.9 %  sodium chloride infusion   Intravenous Continuous Duke Salvia, MD 50 mL/hr at 06/16/11 [phone]    . ceFAZolin  (ANCEF) IVPB 2 g/50 mL premix  2 g Intravenous On Call Duke Salvia, MD      . gentamicin (GARAMYCIN) 80 mg in sodium chloride irrigation 0.9 % 500 mL irrigation  80 mg Irrigation On Call Duke Salvia, MD      . mupirocin ointment Idelle Jo) 2 %   Nasal Once Duke Salvia, MD   1 application at 06/16/11 0857    Allergies  Allergen Reactions  . Codeine Nausea And Vomiting  . Sulfonamide Derivatives Itching and Rash    Review of Systems negative except from HPI and PMH  Physical Exam BP 154/79  Pulse 72  Temp(Src) 97 F (36.1 C) (Oral)  Resp 20  Ht 6' (1.829 m)  Wt 185 lb (83.915 kg)  BMI 25.09 kg/m2  SpO2 99% Well developed and well nourished in no acute distress HENT normal E scleral and icterus clear Neck Supple JVP flat; carotids brisk and full Clear to ausculation Regular rate and rhythm, no murmurs gallops or rub Soft with active bowel sounds No clubbing cyanosis none Edema Alert and oriented, grossly normal motor and sensory function Skin Warm and Dry   Assessment and  Plan   For CRT P implantation  But first have to verify the EF as noted on the cath report The benefits and risks were reviewed including but not limited to death,  perforation, infection, lead dislodgement and device malfunction.  The patient understands agrees and is willing to proceed.

## 2011-06-16 NOTE — Brief Op Note (Signed)
06/16/2011  12:26 PM  PATIENT:  Andrew [name]  78 y.o. male  PRE-OPERATIVE DIAGNOSIS:  Heart failure lbbb Nonischemic cardiomyopathy POST-OPERATIVE DIAGNOSIS:  *same  PROCEDURE:  Procedure(s) (LRB): BI-VENTRICULAR PACEMAKER INSERTION (CRT-P) (N/A)  SURGEON:  Surgeon(s) and Role:    * Duke Salvia, MD - Primary   DICTATION: .Note written in EPIC and Other Dictation: Dictation Number [id number]    Delay start of Pharmacological VTE agent (>24hrs) due to surgical blood loss or risk of bleeding: no

## 2011-06-17 ENCOUNTER — Other Ambulatory Visit: Payer: Self-pay

## 2011-06-17 ENCOUNTER — Encounter (HOSPITAL_COMMUNITY): Payer: Self-pay | Admitting: Cardiology

## 2011-06-17 ENCOUNTER — Ambulatory Visit (HOSPITAL_COMMUNITY): Payer: Medicare Other

## 2011-06-17 DIAGNOSIS — I5022 Chronic systolic (congestive) heart failure: Secondary | ICD-10-CM

## 2011-06-17 DIAGNOSIS — Z950 Presence of cardiac pacemaker: Secondary | ICD-10-CM

## 2011-06-17 NOTE — Discharge Summary (Signed)
Discharge Summary   Patient ID: Andrew [name] MRN: [id number], DOB/AGE: [birth year] 78 y.o.  Primary MD: Dois Davenport., MD, MD Primary Cardiologist: Dr. Jacalyn Lefevre  Admit date: 06/16/2011 D/C date:     06/17/2011      Primary Discharge Diagnoses:  1. Chronic Systolic CHF 2/2 ICM  - Cath 04/30/11 severe LV dysfunction EF 25%  - s/p Medtronic BiV Pacemaker implantation 06/16/11  Secondary Discharge Diagnoses:  1. Atrial flutter - off amiodarone because of increased LFTs   2. Atrial fibrillation - On Xarelto 3. CAD - MI in 1984, 99 and 07. s/p multiple RCA stents  4. Hyperlipidemia  5. Hypertension 6. Chronic Renal Insufficiency 7. COPD (chronic obstructive pulmonary disease) - O2 dependent 8. GERD  9. Claudication   10. BPH (benign prostatic hyperplasia)   11. Hemorrhoids   12. H/o adenomatous colonic polyps   13. S/p Cataract extraction  14. S/p Salivary gland surgery  Allergies Allergen Reactions  . Codeine Nausea And Vomiting  . Sulfonamide Derivatives Itching and Rash    Diagnostic Studies/Procedures:   06/16/11 -  Dual-chamber pacemaker implantation with left ventricular lead placement - Medtronic pacemakers, model C4TR01, serial number [id number] (See op note for full report)  History of Present Illness: 78 y.o. male w/ above medical problems who presented to [facility] on [facility]16/11 for planned CRT-P implantation.  He has known cardiomyopathy and has had multiple hospitalizations for heart failure. Most recent echo revealed EF 15% w/ diffuse hypokinesis & inferoposterior & lateral akinesis. Cardiac cath 04/30/11 revealed moderate RCA distal disease with patent RCA stent and non obstructive disease elsewhere with severe LV dysfunction EF 25%. Due to his comorbidities including O2 dependent COPD, renal insufficiency, and A. Fib recommendations were made for BiV pacemaker.  Hospital Course: He presented to [facility] on 06/16/11 in stable  condition. He underwent implantation of a Medtronic dual chamber pacemaker with LV lead (see op note for full report). He tolerated the procedure well without complications. Postop CXR was without pneumothorax or acute cardiopulmonary findings. The device was interrogated on day of discharge and found to be functioning normally.  He was seen and evaluated by Dr. Graciela Husbands who felt he was stable for discharge home with plans for follow up as scheduled below.  Discharge Vitals: Blood pressure 140/89, pulse 60, temperature 98.1 F (36.7 C), temperature source Oral, resp. rate 18, height 6' (1.829 m), weight 185 lb (83.915 kg), SpO2 98.00%.  Labs:    06/16/2011 09:15  Prothrombin Time 18.6 (H)  INR 1.52 (H)    Discharge Medications   Medication List  As of 06/17/2011 11:12 AM   TAKE these medications         acetaminophen 500 MG tablet   Commonly known as: TYLENOL   Take 1,000 mg by mouth every 12 (twelve) hours as needed. For pain.      ALPRAZolam 0.25 MG tablet   Commonly known as: XANAX   Take 0.25 mg by mouth at bedtime as needed. For anxiety.      calcium carbonate 750 MG chewable tablet   Commonly known as: TUMS EX   Chew 3 tablets by mouth daily as needed. For upset stomach.      carvedilol 12.5 MG tablet   Commonly known as: COREG   Take 1 tablet (12.5 mg total) by mouth 2 (two) times daily with a meal.      esomeprazole 40 MG capsule   Commonly known as: NEXIUM  Take 40 mg by mouth daily before breakfast.      Fluticasone-Salmeterol 250-50 MCG/DOSE Aepb   Commonly known as: ADVAIR   Inhale 1 puff into the lungs every 12 (twelve) hours.      furosemide 40 MG tablet   Commonly known as: LASIX   Take 40-80 mg by mouth daily. 80 mg if weight >186      HYDROcodone-acetaminophen 5-500 MG per tablet   Commonly known as: VICODIN   Take 1 tablet by mouth every 12 (twelve) hours as needed. For pain.      ipratropium-albuterol 0.5-2.5 (3) MG/3ML Soln   Commonly known as:  DUONEB   Take 3 mLs by nebulization 3 (three) times daily as needed. For shortness of breath      losartan 50 MG tablet   Commonly known as: COZAAR   Take 1 tablet (50 mg total) by mouth 2 (two) times daily.      MAALOX MAXIMUM STRENGTH PO   Take 30 mLs by mouth daily as needed. For indigestion      nitroGLYCERIN 0.4 MG SL tablet   Commonly known as: NITROSTAT   Place 1 tablet (0.4 mg total) under the tongue every 5 (five) minutes as needed for chest pain.      prenatal multivitamin Tabs   Take 1 tablet by mouth every evening.      PROAIR HFA IN   Inhale 2 puffs into the lungs every 8 (eight) hours as needed. Shortness of breath.      albuterol (2.5 MG/3ML) 0.083% NEBU 3 mL, albuterol (5 MG/ML) 0.5% NEBU 0.5 mL   Inhale 2.5 mg into the lungs 3 (three) times daily as needed. For shortness of breath.      Rivaroxaban 20 MG Tabs   Take 20 mg by mouth daily.      Tamsulosin HCl 0.4 MG Caps   Commonly known as: FLOMAX   Take 0.4 mg by mouth 2 (two) times daily.            Disposition   Discharge Orders    Future Appointments: Provider: Department: Dept Phone: Center:   06/28/2011 3:45 PM [facility] [phone] [facility]   06/28/2011 4:00 PM [facility] [phone] [facility]   07/15/2011 11:15 AM [facility] [employer] [facility] [phone] [facility]   07/15/2011 11:30 AM Beatrice Lecher, PA [facility] [phone] [facility]   10/01/2011 9:15 AM Duke Salvia, MD [facility] [phone] [facility]     Future Orders Please Complete By Expires   Diet - low sodium heart healthy      Increase activity slowly      Discharge instructions      Comments:   *PLEASE REMEMBER TO BRING ALL OF YOUR MEDICATIONS TO EACH OF YOUR FOLLOW-UP OFFICE VISITS.      Follow-up Information    Follow up with [facility] on 06/28/2011. (Please arrive at 3:45 for lab work. Device/wound check appt at 4:00)     Contact information:   [facility] [address] [place] [address] [phone]      Follow up with Tereso Newcomer, PA on 07/15/2011. (Please arrive at 11:15 for your lab work. Appt with Tereso Newcomer PA-C is at 11:30)    Contact information:   [facility] [address] [place] [address] [phone]      Follow up with Sherryl Manges, MD on 10/01/2011. (9:15)  Contact information:   [facility] [address] [place] [address] [place] [address] [phone]           Outstanding Labs/Studies:  1. BMET at 2wk and 4-6wk f/u  Duration of Discharge Encounter: Greater than 30 minutes including physician and PA time.  Signed, Miguel Medal PA-C 06/17/2011, 11:12 AM

## 2011-06-17 NOTE — Op Note (Signed)
NAMECAMERON, SCHWINN NO.:  [id number]  MEDICAL RECORD NO.:  [id number]  LOCATION:  2508                         FACILITY:  [facility]  PHYSICIAN:  Duke Salvia, MD, FACCDATE OF BIRTH:  12-31-1930  DATE OF PROCEDURE:  06/16/2011 DATE OF DISCHARGE:                              OPERATIVE REPORT   SURGEON:  Duke Salvia, MD, [facility]  PREOPERATIVE DIAGNOSIS:  Nonischemic cardiomyopathy with depressed left ventricular function and left bundle-branch block.  POSTOPERATIVE DIAGNOSIS:  Nonischemic cardiomyopathy with depressed left ventricular function and left bundle-branch block.  PROCEDURE:  Dual-chamber pacemaker implantation with left ventricular lead placement.  DESCRIPTION OF PROCEDURE:  Following obtaining informed consent, the patient was brought to the Electrophysiology Laboratory and placed on the fluoroscopic table in supine position.  After routine prep and drape of the left upper chest, lidocaine was injected in the prepectoral subclavicular region and incision was made and carried down to the layer of the prepectoral fascia using electrocautery and sharp dissection.  A pocket was formed similarly.  Hemostasis was obtained.  Thereafter, attention was turned to gain access to the extrathoracic left subclavian vein, which was complicated on one occasion by puncture of the artery.  Pressure was held for 2 minutes.  Subsequently, three venipunctures were accomplished and sequentially, a 7-French, 9.5-French, and 7-French sheaths were placed, which were passed a Medtronic 5076 58-cm active fixation atrial lead, serial number [id number] and Medtronic MB2 coronary sinus cannulation system and a Medtronic 5076 52-cm active fixation atrial lead, serial number [id number].  Under fluoroscopic guidance, the RV lead was manipulated to the apex where the bipolar R-wave was 19 with a pace impedance of 1001 ohms with a threshold of 1.1 V at 0.5 msec.  Current of  threshold was 1.0 mA. There was no diaphragmatic pacing at 10 V and the current of injury was brisk.  This lead was secured to the prepectoral fascia, marked with a tie and subsequently the tie was removed following an insertion of the device.  At this point, the coronary artery sinus was cannulated and it was interesting that we twice cut into the middle cardiac vein and we took the venogram and there was a lateral spout coming off at the junction between the mid and distal third and then coursing itself more basally. We elected to pursue this location, we placed a Whisper wire there and a Medtronic 4396 model lead, serial number [id number] was deployed to its most distal ramification with bipolar L-wave was about 20 mV with a pace impedance of 1875 ohms and threshold of 1.2 V at 0.5 msec.  Current of threshold was 0.6 mA.  There was no diaphragmatic pacing at 10 V.  The 9.5-French sheath was removed and the RA lead was then positioned.  The lead mentioned above was placed in the right atrial appendage where the bipolar P-wave was 3 with a pace impedance of 804, threshold of 0.4 at 0.5 msec.  Current of threshold was 1.5 mA.  There was no diaphragmatic pacing at 10 V, and the current of injury was brisk.  This lead was secured to the prepectoral fascia.  The LV delivery system was then removed and the  LV lead was secured to the prepectoral fascia, parameters were stable.  The leads were then attached to a Medtronic pacemakers, model C4TR01, serial number [id number].  P-synchronous pacing and then BiV pacing were identified.  The pocket was copiously irrigated with antibiotic-containing saline solution.  Hemostasis was assured.  Leads and the pulse generator were placed in the pocket, secured to the prepectoral fascia.  The wound was closed in 3 layers in normal fashion.  The wound was washed, dried, and a benzoin, Steri-Strip dressing was applied.  Needle counts, sponge counts, and  instrument counts were correct at the end of the procedure according to the staff. The patient tolerated the procedure without apparent complication apart from the arterial puncture as noted above.     Duke Salvia, MD, [facility]     SCK/MEDQ  D:  06/16/2011  T:  06/17/2011  Job:  [id number]

## 2011-06-17 NOTE — Discharge Instructions (Signed)
Supplemental Discharge Instructions for  Pacemaker Patients  Activity No heavy lifting (>5lbs) or vigorous activity with your affected arm for 4 to 6 weeks.  Do not raise your affected arm above your head for one week.  Gradually raise your affected arm as drawn below.          06/20/11                    06/21/11                           06/22/11                06/23/11  NO DRIVING for 1 week ; you may begin driving on  9/60/45. WOUND CARE   Keep the wound area clean and dry.  Do not get this area wet for one week. No showers for one week; you may shower on 06/23/11.   The tape/steri-strips on your wound will fall off; do not pull them off.  No bandage is needed on the site.  DO  NOT apply any creams, oils, or ointments to the wound area.   If you notice any drainage or discharge from the wound, any swelling or bruising at the site, or you develop a fever > 101? F after you are discharged home, call the office at once.  Special Instructions   You are still able to use cellular telephones; use the ear opposite the side where you have your pacemaker.  Avoid carrying your cellular phone near your device.   When traveling through airports, show security personnel your identification card to avoid being screened in the metal detectors.  Ask the security personnel to use the hand wand.   Avoid arc welding equipment, MRI testing (magnetic resonance imaging), TENS units (transcutaneous nerve stimulators).  Call the office for questions about other devices.   Avoid electrical appliances that are in poor condition or are not properly grounded.   Microwave ovens are safe to be near or to operate.

## 2011-06-17 NOTE — Progress Notes (Signed)
  Patient Name: Andrew [name]      SUBJECTIVE:s/p CRT-P implant with improved breathing through the night  Less cough  No sign soreness  Past Medical History  Diagnosis Date  . LVF (left ventricular failure)     chronic LV systolic failure; last echo in December of 2011 showed EF of 25%.   . Ischemic heart disease   . Atrial flutter     off amiodarone because of increased LFTs  . Shortness of breath     chronic sob with known COPD and probable chronic bronchitis  . COPD (chronic obstructive pulmonary disease)   . Bronchitis, chronic   . Myocardial infarction 1994  . Gastro - esophageal reflux   . Claudication   . BPH (benign prostatic hyperplasia)   . CHF (congestive heart failure)   . Coronary artery disease     status post myocardial infarction in 1984,  99 and 07.  He has multiple RCA stents  . Hypertension   . Atrial fibrillation   . Hemorrhoids   . Hyperlipidemia   . Arrhythmia   . Hx of adenomatous colonic polyps   . Hx of colonoscopy     PHYSICAL EXAM Filed Vitals:   06/16/11 1942 06/17/11 0000 06/17/11 0500 06/17/11 0830  BP:  147/79 140/89   Pulse:      Temp:  97.7 F (36.5 C) 98 F (36.7 C)   TempSrc:  Axillary    Resp:  18 18   Height:      Weight:      SpO2: 99% 99% 98% 98%   Well developed and nourished in no acute distress HENT normal Neck supple with JVP-flat Clear but decreased breath sounds Pocket without heatoma Regular rate and rhythm, no murmurs or gallops Abd-soft with active BS No Clubbing cyanosis edema Skin-warm and dry A & Oriented  Grossly normal sensory and motor function   TELEMETRY: Reviewed telemetry pt in P-synchronous/ AV  pacing     Intake/Output Summary (Last 24 hours) at 06/17/11 0940 Last data filed at 06/16/11 1900  Gross per 24 hour  Intake    290 ml  Output      0 ml  Net    290 ml    LABS: Basic Metabolic Panel: No results found for this basename:  NA:7,K:7,CL:7,CO2:7,GLUCOSE:7,BUN:7,CREATININE:7,CALCIUM:2,MG:2,PHOS:2 in the last 168 hours Cardiac Enzymes: No results found for this basename: CKTOTAL:3,CKMB:3,CKMBINDEX:3,TROPONINI:3 in the last 72 hours CBC: No results found for this basename: WBC:7,NEUTROABS:7,HGB:7,HCT:7,MCV:7,PLT:7 in the last 168 hours PROTIME:  Basename 06/16/11 0915  LABPROT 18.6*  INR 1.52*     Device Interrogation: device fucntion normla   ASSESSMENT AND PLAN:  CHF  Stable improved post implant Copd S/p crt-p  Home today Wound check 2 wweeks w bmet F/u [facility] 4-6 weeks with bmet F/u sk 12 weeks   Signed, Sherryl Manges MD  06/17/2011

## 2011-06-21 ENCOUNTER — Telehealth: Payer: Self-pay | Admitting: Internal Medicine

## 2011-06-21 ENCOUNTER — Ambulatory Visit (INDEPENDENT_AMBULATORY_CARE_PROVIDER_SITE_OTHER): Payer: Medicare Other | Admitting: Internal Medicine

## 2011-06-21 DIAGNOSIS — R0989 Other specified symptoms and signs involving the circulatory and respiratory systems: Secondary | ICD-10-CM

## 2011-06-21 DIAGNOSIS — Z950 Presence of cardiac pacemaker: Secondary | ICD-10-CM

## 2011-06-21 NOTE — Telephone Encounter (Signed)
Patient coming to see Dr Graciela Husbands today at 4:15, ok per [facility]

## 2011-06-21 NOTE — Telephone Encounter (Signed)
New Msg: Pt wife calling wanting to speak with nurse/MD regarding swelling and pain at pt incision had following pacemaker implantation  on Wednesday, March 13th. Please return pt wife call to discuss/advise further.

## 2011-06-21 NOTE — Progress Notes (Signed)
Patient seen as nurse visit for site pain.  S/P CRTP last Wednesday.  Site without redness or swelling.  Pt has been taking Vicodin at home for pain.  No fevers or chills.  Dr Graciela Husbands examined patient.  Plan to give Vicodin 5/500mg  #10 with no refills for site pain.  Pt has appt Monday 3-25 for wound check.  Gypsy Balsam, RN, BSN 06/21/2011 4:32 PM

## 2011-06-21 NOTE — Telephone Encounter (Signed)
Saturday started feeling bad. One place it feels hard and didn't feel this way when he left.  Does not think he has fever.  Pain worse yesterday and today, was sore at first.  Denies and redness or streaking, still has steri strips.

## 2011-06-24 ENCOUNTER — Ambulatory Visit (INDEPENDENT_AMBULATORY_CARE_PROVIDER_SITE_OTHER): Payer: Medicare Other

## 2011-06-28 ENCOUNTER — Ambulatory Visit (INDEPENDENT_AMBULATORY_CARE_PROVIDER_SITE_OTHER): Payer: Medicare Other | Admitting: *Deleted

## 2011-06-28 ENCOUNTER — Encounter: Payer: Self-pay | Admitting: Internal Medicine

## 2011-06-28 DIAGNOSIS — I2589 Other forms of chronic ischemic heart disease: Secondary | ICD-10-CM

## 2011-06-28 DIAGNOSIS — I4892 Unspecified atrial flutter: Secondary | ICD-10-CM

## 2011-06-28 DIAGNOSIS — I4891 Unspecified atrial fibrillation: Secondary | ICD-10-CM

## 2011-06-28 DIAGNOSIS — I5022 Chronic systolic (congestive) heart failure: Secondary | ICD-10-CM

## 2011-06-28 DIAGNOSIS — Z79899 Other long term (current) drug therapy: Secondary | ICD-10-CM

## 2011-06-28 DIAGNOSIS — I10 Essential (primary) hypertension: Secondary | ICD-10-CM

## 2011-06-28 DIAGNOSIS — I255 Ischemic cardiomyopathy: Secondary | ICD-10-CM

## 2011-06-28 LAB — PACEMAKER DEVICE OBSERVATION
AL IMPEDENCE PM: 456 Ohm
AL THRESHOLD: 0.5 V
ATRIAL PACING PM: 0.64
BAMS-0001: 170 {beats}/min
BATTERY VOLTAGE: 3.0668 V
RV LEAD IMPEDENCE PM: 551 Ohm

## 2011-06-28 NOTE — Progress Notes (Signed)
Wound check-PPM 

## 2011-06-29 LAB — BASIC METABOLIC PANEL
BUN: 23 mg/dL (ref 6–23)
CO2: 26 mEq/L (ref 19–32)
Calcium: 8.4 mg/dL (ref 8.4–10.5)
Chloride: 99 mEq/L (ref 96–112)
Creatinine, Ser: 1 mg/dL (ref 0.4–1.5)

## 2011-06-30 ENCOUNTER — Ambulatory Visit (INDEPENDENT_AMBULATORY_CARE_PROVIDER_SITE_OTHER): Payer: Medicare Other | Admitting: Family Medicine

## 2011-06-30 VITALS — BP 104/62 | HR 69 | Temp 97.6°F | Resp 16 | Ht 70.5 in | Wt 188.0 lb

## 2011-06-30 DIAGNOSIS — I509 Heart failure, unspecified: Secondary | ICD-10-CM

## 2011-06-30 DIAGNOSIS — D649 Anemia, unspecified: Secondary | ICD-10-CM

## 2011-06-30 LAB — FERRITIN: Ferritin: 103 ng/mL (ref 22–322)

## 2011-06-30 MED ORDER — FLUTICASONE PROPIONATE 50 MCG/ACT NA SUSP: 2.0000 | Freq: Every day | NASAL | Status: DC

## 2011-06-30 MED ORDER — PRENATAL MULTIVITAMIN CH: 1.0000 | ORAL_TABLET | Freq: Every evening | ORAL | Status: DC

## 2011-06-30 NOTE — Progress Notes (Signed)
  Subjective:    Patient ID: Andrew [name], male    DOB: 10-Jan-1931, 78 y.o.   MRN: [id number]  HPI  Recent AICD implanted March 13 th by Dr. Graciela Husbands at [facility]  Monitoring weight when patient gets more than 185 takes an extra lasix.  Needing additional lasix 2-3 times a week.  Anemia noted at time of procedure; here for follow up BW  Taking boost for additional protein;  protein stores have been low   COPD- stll requiring nebulized treatments every 6 hours.  Also supplementing with ProAir several times a day.  Has not been exercising given recent cold weather. Mucinex twice daily helping with congestion.    Review of Systems     Objective:   Physical Exam  Constitutional: He appears well-developed.  Neck: No JVD present.  Cardiovascular: Normal rate, regular rhythm and normal heart sounds.   Pulmonary/Chest: Effort normal.       bronchiolar BS          Assessment & Plan:   1. Anemia  Ferritin   Will follow up patient once labs back

## 2011-07-02 NOTE — Progress Notes (Deleted)
Quick Note:  Please call patient with normal labs. Thanks! KR  ______ 

## 2011-07-14 ENCOUNTER — Other Ambulatory Visit: Payer: Medicare Other

## 2011-07-14 ENCOUNTER — Ambulatory Visit (INDEPENDENT_AMBULATORY_CARE_PROVIDER_SITE_OTHER): Payer: Medicare Other | Admitting: Physician Assistant

## 2011-07-14 ENCOUNTER — Encounter: Payer: Self-pay | Admitting: Physician Assistant

## 2011-07-14 VITALS — BP 138/70 | HR 75 | Ht 72.0 in | Wt 186.0 lb

## 2011-07-14 DIAGNOSIS — I5022 Chronic systolic (congestive) heart failure: Secondary | ICD-10-CM

## 2011-07-14 DIAGNOSIS — R0602 Shortness of breath: Secondary | ICD-10-CM

## 2011-07-14 DIAGNOSIS — I2510 Atherosclerotic heart disease of native coronary artery without angina pectoris: Secondary | ICD-10-CM

## 2011-07-14 DIAGNOSIS — I10 Essential (primary) hypertension: Secondary | ICD-10-CM

## 2011-07-14 DIAGNOSIS — I4891 Unspecified atrial fibrillation: Secondary | ICD-10-CM

## 2011-07-14 DIAGNOSIS — R05 Cough: Secondary | ICD-10-CM

## 2011-07-14 MED ORDER — NITROGLYCERIN 0.4 MG SL SUBL: 0.4000 mg | SUBLINGUAL_TABLET | SUBLINGUAL | Status: DC | PRN

## 2011-07-14 NOTE — Patient Instructions (Signed)
Lab work today:  BNP and BMET  Your physician recommends that you schedule a follow-up appointment in: 2-3 months with Dr. Antoine Poche.

## 2011-07-14 NOTE — Progress Notes (Signed)
[address] [place], [place]  [address] Phone: [phone] Fax:  [phone]  Date:  07/14/2011   Name:  Andrew [name]       DOB:  1930-04-19 MRN:  [id number]  PCP:  Dr. Nadyne Coombes  Primary Cardiologist:  Dr. Rollene Rotunda  Primary Electrophysiologist:  Dr. Sherryl Manges    History of Present Illness: Andrew [name] is a 78 y.o. male who returns for post hospital follow up.  He has a history of chronic systolic heart failure in the setting of ischemic cardiomyopathy with an ejection fraction of 15-25%, CAD, status post prior PCI to the RCA, atrial fibrillation/flutter (amiodarone discontinued secondary to elevated LFTs), hypertension, hyperlipidemia, chronic kidney disease, COPD (O2 dependent), GERD.  Last echo 02/2011: Mild LVH, EF 15%, posterior lateral akinesis, inferior akinesis, grade 1 diastolic dysfunction, mild LAE.  Last LHC 04/2011:oLM 25%, mLAD 30%, pCFX 25%, prox to mid RCA stent ok, dRCA 30% and 70%, pPDA 25%, mPL 25-30%, EF 25%.  He was evaluated by Dr. Graciela Husbands recently for consideration of ICD.  It was ultimately decided to proceed with CRT-P. Implantation.  This was performed 06/16/11.  The patient was discharged the next day.  It was felt that he should have close followup with Dr. Antoine Poche and therefore was placed on my schedule today.  Recent labs: Sodium 132, potassium 3.9, BUN 23, creatinine 1.0.  Overall, he is well.  He has chronic dyspnea with exertion.  There has been no significant change.  He denies orthopnea or PND.  No syncope.  No chest pain.  Weights are stable.  He does note increased cough.  No change in sputum color or fever.    Past Medical History  Diagnosis Date  . Chronic systolic heart failure     chronic LV systolic failure; last echo in December of 2011 showed EF of 25%; echo 02/2011: Mild LVH, EF 15%, posterior lateral akinesis, inferior akinesis, grade 1 diastolic dysfunction, mild LAE   . Ischemic heart disease   .  Atrial flutter     off amiodarone because of increased LFTs  . Shortness of breath     chronic sob with known COPD and probable chronic bronchitis  . COPD (chronic obstructive pulmonary disease)   . Bronchitis, chronic   . Myocardial infarction 1994  . Gastro - esophageal reflux   . Claudication   . BPH (benign prostatic hyperplasia)   . CHF (congestive heart failure)   . Coronary artery disease     status post myocardial infarction in 1984,  99 and 07.  He has multiple RCA stents;  LHC 04/2011:oLM 25%, mLAD 30%, pCFX 25%, prox to mid RCA stent ok, dRCA 30% and 70%, pPDA 25%, mPL 25-30%, EF 25%  . Hypertension   . Atrial fibrillation   . Hemorrhoids   . Hyperlipidemia   . Arrhythmia   . Hx of adenomatous colonic polyps   . Hx of colonoscopy   . Chronic renal insufficiency     Current Outpatient Prescriptions  Medication Sig Dispense Refill  . acetaminophen (TYLENOL) 500 MG tablet Take 1,000 mg by mouth every 12 (twelve) hours as needed. For pain.      [name] albuterol (2.5 MG/3ML) 0.083% NEBU 3 mL, albuterol (5 MG/ML) 0.5% NEBU 0.5 mL Inhale 2.5 mg into the lungs 3 (three) times daily as needed. For shortness of breath.      . Albuterol Sulfate (PROAIR HFA IN) Inhale 2 puffs into the lungs every 8 (eight)  hours as needed. Shortness of breath.      . ALPRAZolam (XANAX) 0.25 MG tablet Take 0.25 mg by mouth at bedtime as needed. For anxiety.      . Alum & Mag Hydroxide-Simeth (MAALOX MAXIMUM STRENGTH PO) Take 30 mLs by mouth daily as needed. For indigestion      . calcium carbonate (TUMS EX) 750 MG chewable tablet Chew 3 tablets by mouth daily as needed. For upset stomach.      . carvedilol (COREG) 12.5 MG tablet Take 1 tablet (12.5 mg total) by mouth 2 (two) times daily with a meal.  180 tablet  1  . esomeprazole (NEXIUM) 40 MG capsule Take 40 mg by mouth daily before breakfast.      . Fluticasone-Salmeterol (ADVAIR) 250-50 MCG/DOSE AEPB Inhale 1 puff into the lungs every 12 (twelve) hours.        . furosemide (LASIX) 40 MG tablet Take 40-80 mg by mouth daily. 80 mg if weight >186      . HYDROcodone-acetaminophen (VICODIN) 5-500 MG per tablet Take 1 tablet by mouth every 12 (twelve) hours as needed. For pain.      [name] ipratropium-albuterol (DUONEB) 0.5-2.5 (3) MG/3ML SOLN Take 3 mLs by nebulization 3 (three) times daily as needed. For shortness of breath      . losartan (COZAAR) 50 MG tablet Take 1 tablet (50 mg total) by mouth 2 (two) times daily.  180 tablet  1  . nitroGLYCERIN (NITROSTAT) 0.4 MG SL tablet Place 1 tablet (0.4 mg total) under the tongue every 5 (five) minutes as needed for chest pain.  25 tablet  6  . Prenatal Vit-Fe Fumarate-FA (PRENATAL MULTIVITAMIN) TABS Take 1 tablet by mouth every evening.  90 tablet  2  . Rivaroxaban (XARELTO) 20 MG TABS Take 20 mg by mouth daily.  90 tablet  3  . Tamsulosin HCl (FLOMAX) 0.4 MG CAPS Take 0.4 mg by mouth 2 (two) times daily.       [name] DISCONTD: metoprolol tartrate (LOPRESSOR) 25 MG tablet Take 1/2 tablet every other day   45 tablet  3    Allergies: Allergies  Allergen Reactions  . Codeine Nausea And Vomiting  . Sulfonamide Derivatives Itching and Rash    History  Substance Use Topics  . Smoking status: Former Smoker -- 1.5 packs/day for 55 years    Quit date: 01/03/2009  . Smokeless tobacco: Never Used  . Alcohol Use: Yes     occasional     ROS:  Please see the history of present illness.   All other systems reviewed and negative.   PHYSICAL EXAM: VS:  BP 138/70  Pulse 75  Ht 6' (1.829 m)  Wt 186 lb (84.369 kg)  BMI 25.23 kg/m2 Well nourished, well developed, in no acute distress HEENT: normal Neck: no JVD Cardiac:  normal S1, S2; RRR; no murmur Lungs:  Decreased breath sounds bilaterally, faint insp rhonchi at the bases; no wheezing Abd: soft, nontender, no hepatomegaly Ext: no edema Chest: BiV pacer site without hematoma or erythema Skin: warm and dry Neuro:  CNs 2-12 intact, no focal abnormalities  noted  EKG:  NSR, v paced, HR 75  ASSESSMENT AND PLAN:  1. Chronic systolic heart failure Volume appears stable.  Doing well post BiV pacer implant.  He has not noted a dramatic difference in his overall DOE or the way he feels.  Continue beta blocker, ARB, Lasix.  Follow up with Dr. Rollene Rotunda in 2-3 mos.  Check follow up  BMET today.     2. Atrial fibrillation  Maintaining NSR.  He remains on Xarelto.     3. Coronary atherosclerosis of native coronary artery  No angina.  No ASA as he is on Xarelto.     4. Hypertension  Controlled.  Continue current therapy.    5. Cough  No significant criteria met for initiation of antibiotics (COPD exacerbation).  I suggested he have a CXR.  He prefers to follow up with his PCP and get a CXR there.  I will check a BNP today.  If this is significantly elevated, increase Lasix.       Signed, Tereso Newcomer, PA-C  4:00 PM 07/14/2011

## 2011-07-15 ENCOUNTER — Other Ambulatory Visit: Payer: Medicare Other

## 2011-07-15 ENCOUNTER — Ambulatory Visit (INDEPENDENT_AMBULATORY_CARE_PROVIDER_SITE_OTHER): Payer: Medicare Other | Admitting: Family Medicine

## 2011-07-15 ENCOUNTER — Encounter: Payer: Medicare Other | Admitting: Physician Assistant

## 2011-07-15 ENCOUNTER — Ambulatory Visit: Payer: Medicare Other

## 2011-07-15 VITALS — BP 132/65 | HR 80 | Temp 97.5°F | Resp 18 | Ht 72.0 in | Wt 188.0 lb

## 2011-07-15 DIAGNOSIS — R05 Cough: Secondary | ICD-10-CM

## 2011-07-15 DIAGNOSIS — J189 Pneumonia, unspecified organism: Secondary | ICD-10-CM

## 2011-07-15 DIAGNOSIS — R059 Cough, unspecified: Secondary | ICD-10-CM

## 2011-07-15 LAB — BASIC METABOLIC PANEL
Chloride: 100 mEq/L (ref 96–112)
GFR: 57.09 mL/min — ABNORMAL LOW (ref >60.00)
Potassium: 4.2 mEq/L (ref 3.5–5.1)
Sodium: 135 mEq/L (ref 135–145)

## 2011-07-15 MED ORDER — MOXIFLOXACIN HCL 400 MG PO TABS: 400.0000 mg | ORAL_TABLET | Freq: Every day | ORAL | Status: AC

## 2011-07-15 NOTE — Patient Instructions (Signed)

## 2011-07-15 NOTE — Progress Notes (Signed)
78 yo who is 1 month post pacemaker placement.  He has a hacky cough since the surgery associated with wheezing.  Cough is productive, yellow and gray.  He has been propping himself up on a pillow to feel better.  No fever.  Has been taking Mucinex bid without any benefit.  Also using nebulizer with atrovent and albuterol.  Also uses ProAir spray "bomb" prn.  No h/o seasonal allergies.   He feels like problem is in his "goozle"  O:NAD [facility] reading (PRIMARY) by  Dr. Milus Glazier CXR - COPD, right sided infiltrate HEENT:  Normal TM's, scant cerumen; clear oroph Neck:  No adenop, thyromegaly or JVD;  Supple Chest:  Decreased BS, few rales, worse on left Heart:  Reg with I/VI sys ejection type murmur Ext: 1+ pedal and ankle oedema, few ecchymoses on back of hand   A:  COPD with persistent cough x 1 month., infiltrate Consistent with RLL P:  .

## 2011-07-16 ENCOUNTER — Telehealth: Payer: Self-pay | Admitting: *Deleted

## 2011-07-16 NOTE — Telephone Encounter (Signed)
pt notified of lab results and to increase lasix 60 mg x 4 days, pt will start this increase today, so on Tuesday he will resume previous dose as instructed.Danielle Rankin

## 2011-07-16 NOTE — Telephone Encounter (Signed)
Message copied by Tarri Fuller on Fri Jul 16, 2011 11:06 AM ------      Message from: [place], [place] T      Created: Fri Jul 16, 2011  8:21 AM       Saw his PCP and CXR c/w pneumonia which is being treated with antibiotics      BNP up some      He may be more volume overloaded in setting of respiratory illness      Increase Lasix to 60 mg daily for 4 days, then return to usual dose      Tereso Newcomer, PA-C  8:21 AM 07/16/2011

## 2011-07-22 ENCOUNTER — Ambulatory Visit (INDEPENDENT_AMBULATORY_CARE_PROVIDER_SITE_OTHER): Payer: Medicare Other | Admitting: Family Medicine

## 2011-07-22 VITALS — BP 100/57 | HR 75 | Temp 97.8°F | Resp 14 | Ht 71.5 in | Wt 190.2 lb

## 2011-07-22 DIAGNOSIS — J449 Chronic obstructive pulmonary disease, unspecified: Secondary | ICD-10-CM

## 2011-07-22 MED ORDER — FLUTICASONE PROPIONATE 50 MCG/ACT NA SUSP: 2.0000 | Freq: Every day | NASAL | Status: DC

## 2011-07-22 MED ORDER — PREDNISONE 20 MG PO TABS: ORAL_TABLET | ORAL | Status: DC

## 2011-07-22 NOTE — Progress Notes (Signed)
  Subjective:    Patient ID: Andrew [name], male    DOB: 04-28-1930, 78 y.o.   MRN: [id number]  HPI See previous OV Yesterday completed 10 day course of Avelox   Patient states he was improving however after spending time outdoors productive cough has returned(light beige) . PND Wheezing  Using albuterol/atrovent q 6 hours via HHN; now patient feels they're less effective and 2 liters of O2  H/O allergies in the past(Allegra)  Recent OV to Dr. Graciela Husbands; pacer maker placed; chronic heart failure; adjusts lasix depending on his EDW.  Review of Systems     Objective:   Physical Exam  Constitutional: He appears well-developed.  HENT:  Nose: Mucosal edema (turbinates swollen with purple hue) present.  Neck: Neck supple.  Pulmonary/Chest: He has no decreased breath sounds. He has wheezes in the right upper field and the left upper field. He has no rhonchi. He has no rales.  Skin: Ecchymosis: thin skin with multiple eccymosis.       Assessment & Plan:   1. COPD (chronic obstructive pulmonary disease); recent pneumonia  2. Allergic rhinitis    See Rx on AVS Call in 72 hours

## 2011-07-23 ENCOUNTER — Telehealth: Payer: Self-pay | Admitting: Cardiology

## 2011-07-23 NOTE — Telephone Encounter (Signed)
Spoke with pt who states he took the increase dose of Lasix as ordered.  He is concerned because his BNP had been up the last time it was checked (which was only slightly high than his base line) and wanted to know if he should start talking his Lasix two a day.  Instructed pt to return to the prior dose as instructed and that he could take extra if wt goes above 186 lbs.  He stated understanding.

## 2011-07-23 NOTE — Telephone Encounter (Signed)
New msg Pt wants to talk to you about dosage of lasix.  Please call

## 2011-08-02 ENCOUNTER — Telehealth: Payer: Self-pay | Admitting: Cardiology

## 2011-08-02 NOTE — Telephone Encounter (Signed)
Please return call to patient on cell [phone]  Patient had a pacer put in about 6 wks ago and experiencing unusally high blood pressure (174/84)  over the last 2 weeks.  Please return call to patient at on cell [phone].

## 2011-08-02 NOTE — Telephone Encounter (Signed)
Per pt - his BP has been continually elevated for the past several weeks.  Per his report - his normal is 120-130/75 but his BP has been running up to 174/84 to 156/92, this afternoon he reports it is 113/77.  He is having frequent headaches.  He reports taking medications as ordered including Losartan 50 mg twice a day.  Pt aware I will review information with Dr Antoine Poche but that he may need to come in for an appointment prior to changes being made.  Pt states understanding.

## 2011-08-03 NOTE — Telephone Encounter (Signed)
Pt scheduled for follow up with Tereso Newcomer on Friday

## 2011-08-03 NOTE — Telephone Encounter (Signed)
The patient should make an appt this week to review his blood pressure diary and suggest further plans.

## 2011-08-06 ENCOUNTER — Ambulatory Visit (INDEPENDENT_AMBULATORY_CARE_PROVIDER_SITE_OTHER): Payer: Medicare Other | Admitting: Physician Assistant

## 2011-08-06 ENCOUNTER — Encounter: Payer: Self-pay | Admitting: Physician Assistant

## 2011-08-06 VITALS — BP 135/72 | HR 69 | Ht 72.0 in | Wt 189.0 lb

## 2011-08-06 DIAGNOSIS — I5022 Chronic systolic (congestive) heart failure: Secondary | ICD-10-CM

## 2011-08-06 DIAGNOSIS — I10 Essential (primary) hypertension: Secondary | ICD-10-CM

## 2011-08-06 NOTE — Patient Instructions (Addendum)
Your physician recommends that you schedule a follow-up appointment in: PLEASE KEEP FOLLOW UP WITH DR. HOCHREIN  INCREASE COREG TO 25 MG TWICE DAILY CALL WHEN YOU NEED A REFILL   CALL us IN THE NEXT 2 WEEKS WITH YOUR BLOOD PRESSURE READINGS IF AVERAGES ARE STILL OVER 120/80

## 2011-08-06 NOTE — Progress Notes (Signed)
[address] [place], [place]  [address] Phone: [phone] Fax:  [phone]  Date:  08/06/2011   Name:  Andrew [name]       DOB:  09/15/1930 MRN:  [id number]  PCP:  Dr. Nadyne Coombes  Primary Cardiologist:  Dr. Rollene Rotunda  Primary Electrophysiologist:  Dr. Sherryl Manges    History of Present Illness: Andrew [name] is a 78 y.o. male who returns for evaluation of BP.  He has a history of chronic systolic heart failure in the setting of ischemic cardiomyopathy with an ejection fraction of 15-25%, CAD, status post prior PCI to the RCA, atrial fibrillation/flutter (amiodarone discontinued secondary to elevated LFTs), hypertension, hyperlipidemia, chronic kidney disease, COPD (O2 dependent), GERD.  Last echo 02/2011: Mild LVH, EF 15%, posterior lateral akinesis, inferior akinesis, grade 1 diastolic dysfunction, mild LAE.  Last LHC 04/2011:oLM 25%, mLAD 30%, pCFX 25%, prox to mid RCA stent ok, dRCA 30% and 70%, pPDA 25%, mPL 25-30%, EF 25%.  Underwent CRT-P implantation 06/16/11.    Called in recently with concerns over BP.  Notes BP as high as 180/95.  Has a mild HA with this.  Recovering from respiratory illness.  Just finished prednisone.  Breathing better.  Cough also better.  Now describes probably Class 2b symptoms.  No CP.  No syncope.  No orthopnea, PND, edema.  Watches salt ok.  Taking meds as scheduled.    Past Medical History  Diagnosis Date  . Chronic systolic heart failure     chronic LV systolic failure; last echo in December of 2011 showed EF of 25%; echo 02/2011: Mild LVH, EF 15%, posterior lateral akinesis, inferior akinesis, grade 1 diastolic dysfunction, mild LAE   . Ischemic heart disease   . Atrial flutter     off amiodarone because of increased LFTs  . Shortness of breath     chronic sob with known COPD and probable chronic bronchitis  . COPD (chronic obstructive pulmonary disease)   . Bronchitis, chronic   . Myocardial infarction 1994    . Gastro - esophageal reflux   . Claudication   . BPH (benign prostatic hyperplasia)   . Coronary artery disease     status post myocardial infarction in 1984,  99 and 07.  He has multiple RCA stents;  LHC 04/2011:oLM 25%, mLAD 30%, pCFX 25%, prox to mid RCA stent ok, dRCA 30% and 70%, pPDA 25%, mPL 25-30%, EF 25%  . Hypertension   . Atrial fibrillation   . Hemorrhoids   . Hyperlipidemia   . Hx of adenomatous colonic polyps   . Hx of colonoscopy   . Chronic renal insufficiency   . Biventricular cardiac pacemaker in situ 3.2013    Current Outpatient Prescriptions  Medication Sig Dispense Refill  . acetaminophen (TYLENOL) 500 MG tablet Take 1,000 mg by mouth every 12 (twelve) hours as needed. For pain.      [name] albuterol (2.5 MG/3ML) 0.083% NEBU 3 mL, albuterol (5 MG/ML) 0.5% NEBU 0.5 mL Inhale 2.5 mg into the lungs 3 (three) times daily as needed. For shortness of breath.      . Albuterol Sulfate (PROAIR HFA IN) Inhale 2 puffs into the lungs every 8 (eight) hours as needed. Shortness of breath.      . ALPRAZolam (XANAX) 0.25 MG tablet Take 0.25 mg by mouth at bedtime as needed. For anxiety.      . Alum & Mag Hydroxide-Simeth (MAALOX MAXIMUM STRENGTH PO) Take 30 mLs by mouth daily  as needed. For indigestion      . calcium carbonate (TUMS EX) 750 MG chewable tablet Chew 3 tablets by mouth daily as needed. For upset stomach.      . carvedilol (COREG) 25 MG tablet Take 1 tablet (25 mg total) by mouth 2 (two) times daily with a meal.      . esomeprazole (NEXIUM) 40 MG capsule Take 40 mg by mouth daily before breakfast.      . fluticasone (FLONASE) 50 MCG/ACT nasal spray Place 2 sprays into the nose daily.  1 g  6  . Fluticasone-Salmeterol (ADVAIR) 250-50 MCG/DOSE AEPB Inhale 1 puff into the lungs every 12 (twelve) hours.       . furosemide (LASIX) 40 MG tablet Take 40-80 mg by mouth daily. 80 mg if weight >186      . HYDROcodone-acetaminophen (VICODIN) 5-500 MG per tablet Take 1 tablet by mouth  every 12 (twelve) hours as needed. For pain.      [name] ipratropium-albuterol (DUONEB) 0.5-2.5 (3) MG/3ML SOLN Take 3 mLs by nebulization 3 (three) times daily as needed. For shortness of breath      . losartan (COZAAR) 50 MG tablet Take 1 tablet (50 mg total) by mouth 2 (two) times daily.  180 tablet  1  . nitroGLYCERIN (NITROSTAT) 0.4 MG SL tablet Place 1 tablet (0.4 mg total) under the tongue every 5 (five) minutes as needed for chest pain.  25 tablet  6  . predniSONE (DELTASONE) 20 MG tablet 2 daily for 3 days the 1 daily for 6 days  15 tablet  0  . Prenatal Vit-Fe Fumarate-FA (PRENATAL MULTIVITAMIN) TABS Take 1 tablet by mouth every evening.  90 tablet  2  . Rivaroxaban (XARELTO) 20 MG TABS Take 20 mg by mouth daily.  90 tablet  3  . Tamsulosin HCl (FLOMAX) 0.4 MG CAPS Take 0.4 mg by mouth 2 (two) times daily.       [name] DISCONTD: carvedilol (COREG) 12.5 MG tablet Take 1 tablet (12.5 mg total) by mouth 2 (two) times daily with a meal.  180 tablet  1  . DISCONTD: metoprolol tartrate (LOPRESSOR) 25 MG tablet Take 1/2 tablet every other day   45 tablet  3    Allergies: Allergies  Allergen Reactions  . Codeine Nausea And Vomiting  . Spironolactone Other (See Comments)    Gynecomastia  . Sulfonamide Derivatives Itching and Rash    History  Substance Use Topics  . Smoking status: Former Smoker -- 1.5 packs/day for 55 years    Quit date: 01/03/2009  . Smokeless tobacco: Never Used  . Alcohol Use: Yes     occasional     ROS:  Please see the history of present illness.   All other systems reviewed and negative.   PHYSICAL EXAM: VS:  BP 135/72  Pulse 69  Ht 6' (1.829 m)  Wt 189 lb (85.73 kg)  BMI 25.63 kg/m2 Well nourished, well developed, in no acute distress HEENT: normal Neck: no JVD Cardiac:  normal S1, S2; RRR; no murmur Lungs:  Clear to auscultation bilat, no wheezing, rhonchi, rales  Abd: soft, nontender, no hepatomegaly Ext: no edema Skin: warm and dry Neuro:  CNs 2-12  intact, no focal abnormalities noted  ASSESSMENT AND PLAN:  1. Hypertension Increase Coreg to 25 mg BID.  Patient will continue to monitor BPs at home.  If remaining > 120/80, he will call.  2. Chronic Systolic CHF Follow up as planned with Dr. Rollene Rotunda next  month.  If BP remains elevated he will call.  States he had gynecomastia with spironolactone.  Try Eplerenone if BP still elevated.    Luna Glasgow, PA-C  6:52 PM 08/06/2011

## 2011-08-09 ENCOUNTER — Encounter: Payer: Self-pay | Admitting: Family Medicine

## 2011-08-09 ENCOUNTER — Ambulatory Visit (INDEPENDENT_AMBULATORY_CARE_PROVIDER_SITE_OTHER): Payer: Medicare Other | Admitting: Family Medicine

## 2011-08-09 VITALS — BP 163/78 | HR 64 | Temp 96.4°F | Resp 16 | Ht 70.75 in | Wt 189.0 lb

## 2011-08-09 DIAGNOSIS — I679 Cerebrovascular disease, unspecified: Secondary | ICD-10-CM

## 2011-08-09 DIAGNOSIS — I509 Heart failure, unspecified: Secondary | ICD-10-CM

## 2011-08-09 DIAGNOSIS — D649 Anemia, unspecified: Secondary | ICD-10-CM

## 2011-08-09 DIAGNOSIS — Z0000 Encounter for general adult medical examination without abnormal findings: Secondary | ICD-10-CM

## 2011-08-09 DIAGNOSIS — N529 Male erectile dysfunction, unspecified: Secondary | ICD-10-CM

## 2011-08-09 DIAGNOSIS — E785 Hyperlipidemia, unspecified: Secondary | ICD-10-CM

## 2011-08-09 DIAGNOSIS — I10 Essential (primary) hypertension: Secondary | ICD-10-CM

## 2011-08-09 DIAGNOSIS — G8929 Other chronic pain: Secondary | ICD-10-CM

## 2011-08-09 DIAGNOSIS — N400 Enlarged prostate without lower urinary tract symptoms: Secondary | ICD-10-CM

## 2011-08-09 DIAGNOSIS — J449 Chronic obstructive pulmonary disease, unspecified: Secondary | ICD-10-CM

## 2011-08-09 NOTE — Progress Notes (Signed)
  Subjective:    Patient ID: Andrew [name], male    DOB: 01-Aug-1930, 78 y.o.   MRN: [id number]  HPI  Patient presents for CPE  COPD- completed prednisone taper and feels better.  ED/BPH- Dr. Isabel Caprice  Chronic LBP- Dr. Danielle Dess; s/p epidural injections  HTN- normal home BP readings  Atrial flutter/fibrillation- Dr. Antoine Poche  Ischemic CHF; pacemaker/IACD Dr. Graciela Husbands  ASCVD TIA 12/12   SH/ Marital issues  Review of Systems See all medicare PE forms (to be scanned)    Objective:   Physical Exam  Constitutional: He is oriented to person, place, and time. He appears well-developed.  HENT:  Head: Normocephalic and atraumatic.  Right Ear: External ear normal.  Left Ear: External ear normal.  Nose: Nose normal.       HOH  Eyes: EOM are normal. Pupils are equal, round, and reactive to light.  Neck: Neck supple. No JVD present. No thyromegaly present.       No bruits  Cardiovascular: Normal rate and regular rhythm.   Pulmonary/Chest: Effort normal and breath sounds normal.  Abdominal: Soft. Bowel sounds are normal. There is no tenderness.  Musculoskeletal: Normal range of motion.  Neurological: He is alert and oriented to person, place, and time. He displays normal reflexes. No cranial nerve deficit. He exhibits normal muscle tone. Coordination normal.  Skin:       Thin, multiple ecchymotic lesions  Psychiatric: He has a normal mood and affect.          Assessment & Plan:   1. Routine general medical examination at a health care facility    2. HTN (hypertension)  TSH  3. COPD (chronic obstructive pulmonary disease)    4. CHF, chronic    5. Cerebral vascular disease    6. BPH (benign prostatic hyperplasia)    7. ED (erectile dysfunction)    8. Anemia  Ferritin  9. Chronic LBP    10. Dyslipidemia  Lipid panel   Anticipatory guidance Counseling Continue current medications and follow up with all subspecialists

## 2011-08-10 LAB — LIPID PANEL
Cholesterol: 207 mg/dL — ABNORMAL HIGH (ref 0–200)
Triglycerides: 194 mg/dL — ABNORMAL HIGH (ref <150)

## 2011-08-12 ENCOUNTER — Telehealth: Payer: Self-pay

## 2011-08-12 ENCOUNTER — Telehealth: Payer: Self-pay | Admitting: Cardiology

## 2011-08-12 NOTE — Telephone Encounter (Signed)
Dr. Hal Hope patient would like a call when his pe papers are ready for pick up  [phone]

## 2011-08-12 NOTE — Telephone Encounter (Signed)
Pt calling concerned about his feet swelling - left worse than right,  And his wt being up one lb.  He reports that he took extra Lasix today and is seeing some urine output.  He is most concerned about his blood pressures.  He reports that they are "all over the place".  They range from 106/63 to 170/98.  Pt denies any other symptoms.  He would like Tereso Newcomer to be aware and to call him back with any changes.

## 2011-08-12 NOTE — Telephone Encounter (Signed)
Paperwork to be left up front. Please let patient know.

## 2011-08-12 NOTE — Telephone Encounter (Signed)
Patient request return call from nurse, regarding ankle swelling and BP 159/101. No SOB or dizziness at this time, patient can be reached at [phone].

## 2011-08-12 NOTE — Telephone Encounter (Signed)
Change Cozaar from 50 mg bid to 100 mg daily. Take Lasix 40 mg bid for 3 days total. Monitor BP by taking in middle of the day (after taking medications). After one week, check BPs and let me know what they are doing. Tereso Newcomer, PA-C  9:58 PM 08/12/2011

## 2011-08-13 NOTE — Telephone Encounter (Signed)
The patient called regarding status of paperwork.  The paperwork was not located up front as of 4:55pm 08/13/11. Please call patient when ready.

## 2011-08-16 ENCOUNTER — Telehealth: Payer: Self-pay | Admitting: Cardiology

## 2011-08-16 MED ORDER — CARVEDILOL 25 MG PO TABS: 25.0000 mg | ORAL_TABLET | Freq: Two times a day (BID) | ORAL | Status: DC

## 2011-08-16 NOTE — Telephone Encounter (Signed)
LMOM that PPW is ready for p/up in drawer at 102

## 2011-08-16 NOTE — Telephone Encounter (Signed)
New Problem:     Patient called back still confused about one of his medications,carvedilol (COREG) 25 MG tablet.  Please call back.

## 2011-08-16 NOTE — Telephone Encounter (Signed)
reviewed with pt again that his carvedilol dosage is not changing.  He states understanding

## 2011-08-16 NOTE — Telephone Encounter (Signed)
Left message for pt to call back  °

## 2011-08-16 NOTE — Telephone Encounter (Signed)
Reviewed instructions with pt who states understanding.  He will follow instructions given and call back with BP's in 1 week

## 2011-08-16 NOTE — Telephone Encounter (Signed)
Pt was to call to talk to nurse re update on condition

## 2011-08-16 NOTE — Telephone Encounter (Signed)
Pt rtn pam's call °

## 2011-08-16 NOTE — Telephone Encounter (Signed)
Please see 08/12/2011 telephone note

## 2011-08-18 ENCOUNTER — Telehealth: Payer: Self-pay | Admitting: Cardiology

## 2011-08-18 NOTE — Telephone Encounter (Signed)
He had BiV pacer put in in 06/2011. I have to defer this to EP (Dr. Graciela Husbands). Tereso Newcomer, PA-C  11:05 AM 08/18/2011

## 2011-08-18 NOTE — Telephone Encounter (Signed)
Andrew [name] states that Andrew [name] is very anxious about his lawn not getting mowed.  He is unable to find someone to mow it for him.  She states his bp is elevated over this issue.  She states it is starting to decrease a little.  She is not sure what the numbers are but thinks it is in the 80s-90s (diastolic).  She wants to know if Andrew [name] can use the riding lawnmower on a low speed if he only does a little bit at a time and is very careful?  She says she isn't trying to argue but he is being difficult.

## 2011-08-18 NOTE — Telephone Encounter (Signed)
Pt's wife calling re lastppt with scott and if tit was ok to mow the yard scott told to wait till June till appt with jake, BP high, pt's wife thinks it's anxiety over worrying about the yard, pls advise

## 2011-08-23 NOTE — Telephone Encounter (Signed)
He can mow his yard from a pacemaker point of view

## 2011-08-24 ENCOUNTER — Telehealth: Payer: Self-pay | Admitting: Physician Assistant

## 2011-08-24 NOTE — Telephone Encounter (Signed)
Error

## 2011-08-24 NOTE — Telephone Encounter (Signed)
Called pt, left msg may use mower but use extreme care, unsure of stiches/ suture line. Asked to call back with questions or concerns. Number provided.

## 2011-08-24 NOTE — Telephone Encounter (Signed)
New msg Pt called about his BP meds. He wanted to talk to you

## 2011-08-24 NOTE — Telephone Encounter (Signed)
S/w pt today about BP's, pt gave reading from 5/14-5/21 states he also has some edema at the ankles, he is on lasix 40 AM and takes an extra lasix if weight is >185.  BLOOD PRESSURE READINGS FROM PT TODAY  08/17/11 ..3 PM 159/86 66  08/18/11.. 2 PM 116/64 79  08/19/11.. 2 PM 158/86 68  08/20/11.. 12 PM 134/75 69  08/21/11..3 PM 166/93 66  08/22/11.. 1 PM 176/83 66  08/23/11..10 AM 127/77 60  08/24/11.. 12 PM 155/75 71    

## 2011-08-24 NOTE — Telephone Encounter (Signed)
S/w pt today about BP's, pt gave reading from 5/14-5/21 states he also has some edema at the ankles, he is on lasix 40 AM and takes an extra lasix if weight is >185.  BLOOD PRESSURE READINGS FROM PT TODAY  08/17/11 .[name]3 PM 159/86 66  08/18/11.[name] 2 PM 116/64 79  08/19/11.[name] 2 PM 158/86 68  08/20/11.. 12 PM 134/75 69  08/21/11.[name]3 PM 166/93 66  08/22/11.[name] 1 PM 176/83 66  08/23/11..10 AM 127/77 60  08/24/11.. 12 PM 155/75 71

## 2011-08-25 ENCOUNTER — Telehealth: Payer: Self-pay | Admitting: *Deleted

## 2011-08-25 ENCOUNTER — Telehealth: Payer: Self-pay | Admitting: Cardiology

## 2011-08-25 DIAGNOSIS — I10 Essential (primary) hypertension: Secondary | ICD-10-CM

## 2011-08-25 MED ORDER — EPLERENONE 25 MG PO TABS: 12.5000 mg | ORAL_TABLET | Freq: Every day | ORAL | Status: DC

## 2011-08-25 MED ORDER — HYDRALAZINE HCL 25 MG PO TABS: 25.0000 mg | ORAL_TABLET | Freq: Three times a day (TID) | ORAL | Status: DC

## 2011-08-25 NOTE — Telephone Encounter (Signed)
D/C Inspra Start Hydralazine 25 mg TID Tereso Newcomer, PA-C  5:05 PM 08/25/2011

## 2011-08-25 NOTE — Telephone Encounter (Signed)
s/w pt and he is aware to start Inspra 25 mg tab take 1/2 tab QD, pt states he will start tomorrow, bmet 5/28 and 6/5 

## 2011-08-25 NOTE — Telephone Encounter (Signed)
s/w pt and he is aware to start Inspra 25 mg tab take 1/2 tab QD, pt states he will start tomorrow, bmet 5/28 and 6/5

## 2011-08-25 NOTE — Telephone Encounter (Signed)
Spoke with pt wife, she reports the pt has tried inspra before and it caused a lot of indigestion, stomach pain and distress. Will discuss with scott weaver pac.

## 2011-08-25 NOTE — Telephone Encounter (Signed)
Let's start Inspra 25 mg take 1/2 tablet daily. He needs a BMET 5 days after starting and 12 days after starting. If weight up or increased swelling, he can take Lasix 20 mg extra. Tereso Newcomer, PA-C  8:49 AM 08/25/2011

## 2011-08-25 NOTE — Telephone Encounter (Signed)
Please return call to patient wife [name] [phone]  Patient has been on Inspra med before and was not able to tolerate it.  Please return call to discuss alternative.

## 2011-08-31 ENCOUNTER — Other Ambulatory Visit (INDEPENDENT_AMBULATORY_CARE_PROVIDER_SITE_OTHER): Payer: Medicare Other

## 2011-08-31 DIAGNOSIS — I10 Essential (primary) hypertension: Secondary | ICD-10-CM

## 2011-08-31 LAB — BASIC METABOLIC PANEL
BUN: 20 mg/dL (ref 6–23)
Chloride: 106 mEq/L (ref 96–112)
Glucose, Bld: 96 mg/dL (ref 70–99)
Potassium: 4 mEq/L (ref 3.5–5.1)

## 2011-09-08 ENCOUNTER — Other Ambulatory Visit (INDEPENDENT_AMBULATORY_CARE_PROVIDER_SITE_OTHER): Payer: Medicare Other

## 2011-09-08 DIAGNOSIS — I10 Essential (primary) hypertension: Secondary | ICD-10-CM

## 2011-09-08 LAB — BASIC METABOLIC PANEL
CO2: 28 mEq/L (ref 19–32)
Calcium: 8.5 mg/dL (ref 8.4–10.5)
Creatinine, Ser: 1.2 mg/dL (ref 0.4–1.5)
Glucose, Bld: 86 mg/dL (ref 70–99)
Sodium: 137 mEq/L (ref 135–145)

## 2011-09-09 ENCOUNTER — Telehealth: Payer: Self-pay | Admitting: Physician Assistant

## 2011-09-09 NOTE — Telephone Encounter (Signed)
Walk In Pt Form " Pt Dropped off BP readings" sent to Scott/Carol  09/09/11/KM

## 2011-09-15 ENCOUNTER — Other Ambulatory Visit: Payer: Self-pay | Admitting: Family Medicine

## 2011-09-15 ENCOUNTER — Other Ambulatory Visit: Payer: Self-pay | Admitting: Nurse Practitioner

## 2011-09-16 ENCOUNTER — Ambulatory Visit: Payer: Medicare Other | Admitting: Cardiology

## 2011-09-17 ENCOUNTER — Encounter: Payer: Self-pay | Admitting: *Deleted

## 2011-09-22 ENCOUNTER — Ambulatory Visit (INDEPENDENT_AMBULATORY_CARE_PROVIDER_SITE_OTHER): Payer: Medicare Other | Admitting: Cardiology

## 2011-09-22 ENCOUNTER — Encounter: Payer: Self-pay | Admitting: Cardiology

## 2011-09-22 VITALS — BP 128/64 | HR 67 | Ht 72.0 in | Wt 190.1 lb

## 2011-09-22 DIAGNOSIS — I4891 Unspecified atrial fibrillation: Secondary | ICD-10-CM

## 2011-09-22 DIAGNOSIS — Z860100 Personal history of colon polyps, unspecified: Secondary | ICD-10-CM

## 2011-09-22 DIAGNOSIS — I10 Essential (primary) hypertension: Secondary | ICD-10-CM

## 2011-09-22 MED ORDER — HYDRALAZINE HCL 25 MG PO TABS: 25.0000 mg | ORAL_TABLET | Freq: Three times a day (TID) | ORAL | Status: DC

## 2011-09-22 NOTE — Assessment & Plan Note (Signed)
At this point he is doing well and seems to be fairly euvolemic. I will keep him on the medications as listed. We reviewed salt and fluid restriction and he's keeping meticulous daily weights.

## 2011-09-22 NOTE — Progress Notes (Signed)
HPI The patient presents for followup of his cardiomyopathy. Since I last saw him and since his CRT implant earlier this year he's done much better. He has had titration of his medications. His blood pressure is elevated at bedtime somewhat labile. He took it on his own to split his Cozaar dose recently and his blood pressures have been better. He is not describing any new shortness of breath, PND or orthopnea. He's not having any chest pressure or neck discomfort. He did have an episode of arm discomfort in his left elbow yesterday and actually took one nitroglycerin. He's had some mild lower externally swelling that is only when his feet are on the ground.  Allergies  Allergen Reactions  . Codeine Nausea And Vomiting  . Inspra (Eplerenone)     Stomach problems  . Spironolactone Other (See Comments)    Gynecomastia  . Sulfonamide Derivatives Itching and Rash    Current Outpatient Prescriptions  Medication Sig Dispense Refill  . acetaminophen (TYLENOL) 500 MG tablet Take 1,000 mg by mouth every 12 (twelve) hours as needed. For pain.      [name] ADVAIR DISKUS 250-50 MCG/DOSE AEPB USE ONE INHALATION TWO TIMES A DAY  3 each  1  . albuterol (2.5 MG/3ML) 0.083% NEBU 3 mL, albuterol (5 MG/ML) 0.5% NEBU 0.5 mL Inhale 2.5 mg into the lungs 3 (three) times daily as needed. For shortness of breath.      . Albuterol Sulfate (PROAIR HFA IN) Inhale 2 puffs into the lungs every 8 (eight) hours as needed. Shortness of breath.      . ALPRAZolam (XANAX) 0.25 MG tablet Take 0.25 mg by mouth at bedtime as needed. For anxiety.      . Alum & Mag Hydroxide-Simeth (MAALOX MAXIMUM STRENGTH PO) Take 30 mLs by mouth daily as needed. For indigestion      . calcium carbonate (TUMS EX) 750 MG chewable tablet Chew 3 tablets by mouth daily as needed. For upset stomach.      . carvedilol (COREG) 25 MG tablet Take 1 tablet (25 mg total) by mouth 2 (two) times daily with a meal.  120 tablet  1  . esomeprazole (NEXIUM) 40 MG  capsule Take 40 mg by mouth daily before breakfast.      . fluticasone (FLONASE) 50 MCG/ACT nasal spray Place 2 sprays into the nose daily.  1 g  6  . furosemide (LASIX) 40 MG tablet Take 40-80 mg daily.  80 mg if weight >186  135 tablet  1  . hydrALAZINE (APRESOLINE) 25 MG tablet Take 1 tablet (25 mg total) by mouth 3 (three) times daily.  90 tablet  12  . HYDROcodone-acetaminophen (VICODIN) 5-500 MG per tablet Take 1 tablet by mouth every 12 (twelve) hours as needed. For pain.      [name] ipratropium-albuterol (DUONEB) 0.5-2.5 (3) MG/3ML SOLN Take 3 mLs by nebulization 3 (three) times daily as needed. For shortness of breath      . losartan (COZAAR) 50 MG tablet Take 1 tablet (50 mg total) by mouth 2 (two) times daily.  180 tablet  1  . nitroGLYCERIN (NITROSTAT) 0.4 MG SL tablet Place 1 tablet (0.4 mg total) under the tongue every 5 (five) minutes as needed for chest pain.  25 tablet  6  . Prenatal Vit-Fe Fumarate-FA (PRENATAL MULTIVITAMIN) TABS Take 1 tablet by mouth every evening.  90 tablet  2  . Rivaroxaban (XARELTO) 20 MG TABS Take 20 mg by mouth daily.  90 tablet  3  . Tamsulosin HCl (FLOMAX) 0.4 MG CAPS Take 0.4 mg by mouth 2 (two) times daily.       [name] DISCONTD: furosemide (LASIX) 40 MG tablet Take 40-80 mg by mouth daily. 80 mg if weight >186      . DISCONTD: metoprolol tartrate (LOPRESSOR) 25 MG tablet Take 1/2 tablet every other day   45 tablet  3    Past Medical History  Diagnosis Date  . Chronic systolic heart failure     chronic LV systolic failure; last echo in December of 2011 showed EF of 25%; echo 02/2011: Mild LVH, EF 15%, posterior lateral akinesis, inferior akinesis, grade 1 diastolic dysfunction, mild LAE   . Ischemic heart disease   . Atrial flutter     off amiodarone because of increased LFTs  . Shortness of breath     chronic sob with known COPD and probable chronic bronchitis  . COPD (chronic obstructive pulmonary disease)   . Bronchitis, chronic   . Myocardial  infarction 1994  . Gastro - esophageal reflux   . Claudication   . BPH (benign prostatic hyperplasia)   . Coronary artery disease     status post myocardial infarction in 1984,  99 and 07.  He has multiple RCA stents;  LHC 04/2011:oLM 25%, mLAD 30%, pCFX 25%, prox to mid RCA stent ok, dRCA 30% and 70%, pPDA 25%, mPL 25-30%, EF 25%  . Hypertension   . Atrial fibrillation   . Hemorrhoids   . Hyperlipidemia   . Hx of adenomatous colonic polyps   . Hx of colonoscopy   . Chronic renal insufficiency   . Biventricular cardiac pacemaker in situ 3.2013    Past Surgical History  Procedure Date  . Cardiac catheterization 2008  . Cataract extraction   . Salivary gland surgery   . Coronary angioplasty with stent placement   . Insert / replace / remove pacemaker 06/16/2011    ROS:  As stated in the HPI and negative for all other systems.  PHYSICAL EXAM BP 128/64  Pulse 67  Ht 6' (1.829 m)  Wt 190 lb 1.9 oz (86.238 kg)  BMI 25.78 kg/m2 GENERAL: Frail appearing  HEENT: Pupils equal round and reactive, fundi not visualized, oral mucosa unremarkable, dentures  NECK:  Jugular venous distention 10 cm, waveform within normal limits, carotid upstroke brisk and symmetric, no bruits, no thyromegaly  LYMPHATICS: No cervical, inguinal adenopathy  LUNGS: Clear to auscultation bilaterally  BACK: No CVA tenderness  CHEST: Well healed ICD scar HEART: PMI not displaced or sustained,S1 and S2 within normal limits, no S3, no S4, no clicks, no rubs, no murmurs  ABD: Flat, positive bowel sounds normal in frequency in pitch, no bruits, no rebound, no guarding, no midline pulsatile mass, no hepatomegaly, no splenomegaly  EXT: 2 plus pulses throughout, mild ankle edema, no cyanosis no clubbing, chronic venous stasis changes.  SKIN: Bruising  ASSESSMENT AND PLAN

## 2011-09-22 NOTE — Assessment & Plan Note (Signed)
He will continue the meds as listed. He's had no symptomatic paroxysms.

## 2011-09-22 NOTE — Patient Instructions (Addendum)
The current medical regimen is effective;  continue present plan and medications.  Follow up in 3 months with Dr Hochrein. 

## 2011-09-22 NOTE — Assessment & Plan Note (Signed)
His blood pressure is somewhat labile. At this point I would not suggest further med titration. He will continue with meds as listed.

## 2011-09-26 ENCOUNTER — Ambulatory Visit (INDEPENDENT_AMBULATORY_CARE_PROVIDER_SITE_OTHER): Payer: Medicare Other | Admitting: Emergency Medicine

## 2011-09-26 VITALS — BP 133/56 | HR 82 | Temp 98.1°F | Resp 16 | Ht 73.0 in | Wt 190.0 lb

## 2011-09-26 DIAGNOSIS — R0602 Shortness of breath: Secondary | ICD-10-CM

## 2011-09-26 DIAGNOSIS — M25569 Pain in unspecified knee: Secondary | ICD-10-CM

## 2011-09-26 DIAGNOSIS — R05 Cough: Secondary | ICD-10-CM

## 2011-09-26 DIAGNOSIS — J449 Chronic obstructive pulmonary disease, unspecified: Secondary | ICD-10-CM

## 2011-09-26 MED ORDER — DOXYCYCLINE HYCLATE 100 MG PO CAPS: 100.0000 mg | ORAL_CAPSULE | Freq: Two times a day (BID) | ORAL | Status: DC

## 2011-09-26 NOTE — Patient Instructions (Signed)

## 2011-09-26 NOTE — Progress Notes (Signed)
  Subjective:    Patient ID: Andrew [name], male    DOB: 1930-09-02, 78 y.o.   MRN: [id number]  HPI patient enters with a two-day history of chest discomfort associated with cough. Patient has been coughing up a good deal of phlegm which does have color. He recently had a pacemaker placed and recently saw his cardiologist who felt everything was going well . He has not had any chest pain he occasionally coughs up large quantities of mucus. He uses his nebulizer 3 times a day which is a dual neb. He is also on Advair. He also has a albuterol inhaler he uses for breakthrough breathing issues.    Review of Systems     Objective:   Physical Exam HEENT exam TMs are normal nose normal throat is slightly red the neck is supple examination of chest reveals dry rales from the midline down to cardiac exam reveals a pacemaker in place in the chest wall. Rhythm is slightly irregular. Abdomen is soft and nontender. Extremities show only trace edema      Assessment & Plan:  Most likely this is an acute exacerbation of chronic bronchitis. Patient is known to have significant COPD. He is on a regular regimen for treatment of this. We'll get Emetrol doxycycline 100 twice a day if he continues to have trouble he may need a steroid taper.

## 2011-10-01 ENCOUNTER — Encounter: Payer: Self-pay | Admitting: Internal Medicine

## 2011-10-01 ENCOUNTER — Ambulatory Visit (INDEPENDENT_AMBULATORY_CARE_PROVIDER_SITE_OTHER): Payer: Medicare Other | Admitting: Internal Medicine

## 2011-10-01 VITALS — BP 114/58 | HR 84 | Ht 72.0 in | Wt 192.0 lb

## 2011-10-01 DIAGNOSIS — Z950 Presence of cardiac pacemaker: Secondary | ICD-10-CM

## 2011-10-01 DIAGNOSIS — I4891 Unspecified atrial fibrillation: Secondary | ICD-10-CM

## 2011-10-01 DIAGNOSIS — I255 Ischemic cardiomyopathy: Secondary | ICD-10-CM

## 2011-10-01 DIAGNOSIS — I5022 Chronic systolic (congestive) heart failure: Secondary | ICD-10-CM

## 2011-10-01 DIAGNOSIS — I472 Ventricular tachycardia, unspecified: Secondary | ICD-10-CM

## 2011-10-01 DIAGNOSIS — I2589 Other forms of chronic ischemic heart disease: Secondary | ICD-10-CM

## 2011-10-01 LAB — PACEMAKER DEVICE OBSERVATION
AL AMPLITUDE: 2.5 mv
AL IMPEDENCE PM: 418 Ohm
BATTERY VOLTAGE: 3.0184 V
RV LEAD IMPEDENCE PM: 475 Ohm
VENTRICULAR PACING PM: 98.35

## 2011-10-01 NOTE — Progress Notes (Signed)
HPI  Andrew [name] is a 78 y.o. male Seen in followup for CRT-P implanted in the setting of ischemic cardiomyopathy. He also history of atrial fibrillation previously treated with amiodarone but this can be elevated liver function tests. He has a history of oxygen-dependent COPD  He is much improved following CRT implantation. He has had a recent bronchitis.  Past Medical History  Diagnosis Date  . Chronic systolic heart failure     chronic LV systolic failure; last echo in December of 2011 showed EF of 25%; echo 02/2011: Mild LVH, EF 15%, posterior lateral akinesis, inferior akinesis, grade 1 diastolic dysfunction, mild LAE   . Ischemic heart disease   . Atrial flutter     off amiodarone because of increased LFTs  . Shortness of breath     chronic sob with known COPD and probable chronic bronchitis  . COPD (chronic obstructive pulmonary disease)   . Bronchitis, chronic   . Myocardial infarction 1994  . Gastro - esophageal reflux   . Claudication   . BPH (benign prostatic hyperplasia)   . Coronary artery disease     status post myocardial infarction in 1984,  99 and 07.  He has multiple RCA stents;  LHC 04/2011:oLM 25%, mLAD 30%, pCFX 25%, prox to mid RCA stent ok, dRCA 30% and 70%, pPDA 25%, mPL 25-30%, EF 25%  . Hypertension   . Atrial fibrillation   . Hemorrhoids   . Hyperlipidemia   . Hx of adenomatous colonic polyps   . Hx of colonoscopy   . Chronic renal insufficiency   . Biventricular cardiac pacemaker in situ 3.2013    Past Surgical History  Procedure Date  . Cardiac catheterization 2008  . Cataract extraction   . Salivary gland surgery   . Coronary angioplasty with stent placement   . Insert / replace / remove pacemaker 06/16/2011    Current Outpatient Prescriptions  Medication Sig Dispense Refill  . acetaminophen (TYLENOL) 500 MG tablet Take 1,000 mg by mouth every 12 (twelve) hours as needed. For pain.      [name] ADVAIR DISKUS 250-50 MCG/DOSE AEPB USE ONE  INHALATION TWO TIMES A DAY  3 each  1  . albuterol (2.5 MG/3ML) 0.083% NEBU 3 mL, albuterol (5 MG/ML) 0.5% NEBU 0.5 mL Inhale 2.5 mg into the lungs 3 (three) times daily as needed. For shortness of breath.      . Albuterol Sulfate (PROAIR HFA IN) Inhale 2 puffs into the lungs every 8 (eight) hours as needed. Shortness of breath.      . ALPRAZolam (XANAX) 0.25 MG tablet Take 0.25 mg by mouth at bedtime as needed. For anxiety.      . Alum & Mag Hydroxide-Simeth (MAALOX MAXIMUM STRENGTH PO) Take 30 mLs by mouth daily as needed. For indigestion      . carvedilol (COREG) 25 MG tablet Take 1 tablet (25 mg total) by mouth 2 (two) times daily with a meal.  120 tablet  1  . doxycycline (VIBRAMYCIN) 100 MG capsule Take 1 capsule (100 mg total) by mouth 2 (two) times daily.  20 capsule  0  . esomeprazole (NEXIUM) 40 MG capsule Take 40 mg by mouth daily before breakfast.      . fluticasone (FLONASE) 50 MCG/ACT nasal spray Place 2 sprays into the nose daily.  1 g  6  . furosemide (LASIX) 40 MG tablet Take 40-80 mg daily.  80 mg if weight >186  135 tablet  1  . hydrALAZINE (APRESOLINE) 25  MG tablet Take 1 tablet (25 mg total) by mouth 3 (three) times daily.  270 tablet  3  . HYDROcodone-acetaminophen (VICODIN) 5-500 MG per tablet Take 1 tablet by mouth every 12 (twelve) hours as needed. For pain.      [name] ipratropium-albuterol (DUONEB) 0.5-2.5 (3) MG/3ML SOLN Take 3 mLs by nebulization 3 (three) times daily as needed. For shortness of breath      . losartan (COZAAR) 50 MG tablet Take 1 tablet (50 mg total) by mouth 2 (two) times daily.  180 tablet  1  . nitroGLYCERIN (NITROSTAT) 0.4 MG SL tablet Place 1 tablet (0.4 mg total) under the tongue every 5 (five) minutes as needed for chest pain.  25 tablet  6  . Prenatal Vit-Fe Fumarate-FA (PRENATAL MULTIVITAMIN) TABS Take 1 tablet by mouth every evening.  90 tablet  2  . Rivaroxaban (XARELTO) 20 MG TABS Take 20 mg by mouth daily.  90 tablet  3  . Tamsulosin HCl (FLOMAX)  0.4 MG CAPS Take 0.4 mg by mouth 2 (two) times daily.       [name] DISCONTD: metoprolol tartrate (LOPRESSOR) 25 MG tablet Take 1/2 tablet every other day   45 tablet  3    Allergies  Allergen Reactions  . Codeine Nausea And Vomiting  . Inspra (Eplerenone)     Stomach problems  . Spironolactone Other (See Comments)    Gynecomastia  . Sulfonamide Derivatives Itching and Rash    Review of Systems negative except from HPI and PMH  Physical Exam BP 114/58  Pulse 84  Ht 6' (1.829 m)  Wt 192 lb (87.091 kg)  BMI 26.04 kg/m2 Well developed and nourished in no acute distress HENT normal Neck supple Pocket weill healed Clear Regular rate and rhythm, no murmurs or gallops Abd-soft with active BS No Clubbing cyanosis tr  edema Skin-warm and dry A & Oriented  Grossly normal sensory and motor function    electrocardiogram from April 2013 demonstrated AV pacing

## 2011-10-01 NOTE — Assessment & Plan Note (Signed)
No intercurrent Ventricular tachycardia  

## 2011-10-01 NOTE — Assessment & Plan Note (Signed)
The patient's device was interrogated and the information was fully reviewed.  The device was reprogrammed to maximize longevity  

## 2011-10-01 NOTE — Assessment & Plan Note (Signed)
Stable on current medications. Euvolemic

## 2011-10-01 NOTE — Patient Instructions (Signed)
Your physician wants you to follow-up in: March 2014 with Dr. Klein. You will receive a reminder letter in the mail two months in advance. If you don't receive a letter, please call our office to schedule the follow-up appointment.  Your physician recommends that you continue on your current medications as directed. Please refer to the Current Medication list given to you today.  

## 2011-10-01 NOTE — Assessment & Plan Note (Signed)
No intercurrent Atrial fibrillation or flutter  

## 2011-10-02 ENCOUNTER — Encounter: Payer: Self-pay | Admitting: Family Medicine

## 2011-10-02 ENCOUNTER — Ambulatory Visit (INDEPENDENT_AMBULATORY_CARE_PROVIDER_SITE_OTHER): Payer: Medicare Other | Admitting: Family Medicine

## 2011-10-02 VITALS — BP 115/55 | HR 73 | Temp 97.3°F | Resp 20 | Ht 70.5 in | Wt 189.0 lb

## 2011-10-02 DIAGNOSIS — K209 Esophagitis, unspecified without bleeding: Secondary | ICD-10-CM

## 2011-10-02 DIAGNOSIS — J449 Chronic obstructive pulmonary disease, unspecified: Secondary | ICD-10-CM

## 2011-10-02 DIAGNOSIS — K2100 Gastro-esophageal reflux disease with esophagitis, without bleeding: Secondary | ICD-10-CM

## 2011-10-02 MED ORDER — METHYLPREDNISOLONE ACETATE 80 MG/ML IJ SUSP
80.0000 mg | Freq: Once | INTRAMUSCULAR | Status: AC
  Administered 2011-10-02: 80 mg via INTRAMUSCULAR

## 2011-10-02 NOTE — Patient Instructions (Signed)
Right coronary artery stents.

## 2011-10-02 NOTE — Progress Notes (Signed)
78 year old gentleman with recent exacerbation of COPD. Was started on doxycycline for the last 48 hours he's developed dysphagia and nausea. His shortness of breath also seem to be getting a little bit worse.  Patient's past medical history is significant for COPD and atrial fibrillation along with right coronary artery stenosis and revascularization.  Objective: No acute distress Chest: Bilateral expiratory wheezes and few basilar rales Heart: Irregularly irregular, no murmur Skin: Good color, no rashes HEENT: Unremarkable Gait: Stable  Assessment: Esophagitis and COPD exacerbation  Plan: Stop the doxycycline and provide Depo-Medrol Medrol 80 mg IM 1. COPD, severe  methylPREDNISolone acetate (DEPO-MEDROL) injection 80 mg  2. Esophagitis

## 2011-10-04 ENCOUNTER — Telehealth: Payer: Self-pay | Admitting: Cardiology

## 2011-10-04 MED ORDER — CARVEDILOL 25 MG PO TABS: 25.0000 mg | ORAL_TABLET | Freq: Two times a day (BID) | ORAL | Status: DC

## 2011-10-04 NOTE — Telephone Encounter (Signed)
Patient notified that Rx has been sent to [facility].

## 2011-10-04 NOTE — Telephone Encounter (Signed)
New Problem:    Patient called in needing a 90 day refill of his carvedilol (COREG) 25 MG tablet at the pharmacy listed on his profile.  Please call back went the order has been placed.

## 2011-11-13 ENCOUNTER — Other Ambulatory Visit: Payer: Self-pay | Admitting: Cardiology

## 2011-11-15 NOTE — Telephone Encounter (Signed)
..   Requested Prescriptions   Pending Prescriptions Disp Refills  . losartan (COZAAR) 50 MG tablet [Pharmacy Med Name: LOSARTAN TABS 50MG ] 180 tablet 3    Sig: TAKE 1 TABLET TWICE A DAY

## 2011-11-29 ENCOUNTER — Other Ambulatory Visit: Payer: Self-pay | Admitting: Family Medicine

## 2011-11-29 NOTE — Telephone Encounter (Signed)
Seen 2 months ago for this, ok to refill x 4 months, or longer?

## 2011-12-22 ENCOUNTER — Ambulatory Visit: Payer: Medicare Other

## 2011-12-22 ENCOUNTER — Ambulatory Visit (INDEPENDENT_AMBULATORY_CARE_PROVIDER_SITE_OTHER): Payer: Medicare Other | Admitting: Family Medicine

## 2011-12-22 VITALS — BP 128/62 | HR 70 | Temp 98.3°F | Resp 16 | Ht 70.5 in | Wt 193.6 lb

## 2011-12-22 DIAGNOSIS — S81802A Unspecified open wound, left lower leg, initial encounter: Secondary | ICD-10-CM

## 2011-12-22 DIAGNOSIS — M79609 Pain in unspecified limb: Secondary | ICD-10-CM

## 2011-12-22 DIAGNOSIS — Z23 Encounter for immunization: Secondary | ICD-10-CM

## 2011-12-22 DIAGNOSIS — S81009A Unspecified open wound, unspecified knee, initial encounter: Secondary | ICD-10-CM

## 2011-12-22 DIAGNOSIS — M79669 Pain in unspecified lower leg: Secondary | ICD-10-CM

## 2011-12-22 MED ORDER — DOXYCYCLINE HYCLATE 100 MG PO TABS: 100.0000 mg | ORAL_TABLET | Freq: Two times a day (BID) | ORAL | Status: DC

## 2011-12-22 MED ORDER — CEPHALEXIN 500 MG PO CAPS: 500.0000 mg | ORAL_CAPSULE | Freq: Three times a day (TID) | ORAL | Status: DC

## 2011-12-22 NOTE — Addendum Note (Signed)
Addended by: Abbe Amsterdam C on: 12/22/2011 02:10 PM   Modules accepted: Orders

## 2011-12-22 NOTE — Progress Notes (Addendum)
[facility] [address], [place] [address] [phone]  Date:  12/22/2011   Name:  Andrew [name]   DOB:  Apr 20, 1930   MRN:  [id number]  PCP:  Lucilla Edin, MD    Chief Complaint: Leg Injury   History of Present Illness:  Andrew [name] is a 78 y.o. very pleasant male patient who presents with the following:  About a week ago he was trimming a hedge and cut his left anterior shin on a stick.  They have been treating it at home with silvadene and keeping it clean.  Yesterday his wife ?debrided the wound somehow.  She took "something black" out of the wound- it sounds like it was a piece of dead skin from a skin tear.  He is not sure of the date of his last tetanus shot- chart review shows 2009.  The wound has become more sore and has some surrounding redness for the last several days.   Otherwise he has felt ok, no fever or other problems at this time. He has noted that his BP will sometimes run low ans he can feel tired, but he is seeing his heart doctor next week for this problem.   Patient Active Problem List  Diagnosis  . UNSPECIFIED ANEMIA  . COPD UNSPECIFIED  . GERD  . DYSPNEA  . Nonspecific (abnormal) findings on radiological and other examination of body structure  . LIVER FUNCTION TESTS, ABNORMAL, HX OF  . PERSONAL HISTORY OF COLONIC POLYPS  . ABNORMAL LUNG XRAY  . Atrial flutter  . Chronic systolic heart failure  . Hypertension  . Ischemic cardiomyopathy  . Paroxysmal ventricular tachycardia  . Hyperlipidemia  . Coronary atherosclerosis of native coronary artery  . Acute renal insufficiency  . Atrial fibrillation  . S/P biventricular cardiac pacemaker-[employer]    Past Medical History  Diagnosis Date  . Chronic systolic heart failure     chronic LV systolic failure; last echo in December of 2011 showed EF of 25%; echo 02/2011: Mild LVH, EF 15%, posterior lateral akinesis, inferior akinesis, grade 1 diastolic dysfunction, mild LAE     . Ischemic heart disease   . Atrial flutter     off amiodarone because of increased LFTs  . Shortness of breath     chronic sob with known COPD and probable chronic bronchitis  . COPD (chronic obstructive pulmonary disease)   . Bronchitis, chronic   . Myocardial infarction 1994  . Gastro - esophageal reflux   . Claudication   . BPH (benign prostatic hyperplasia)   . Coronary artery disease     status post myocardial infarction in 1984,  99 and 07.  He has multiple RCA stents;  LHC 04/2011:oLM 25%, mLAD 30%, pCFX 25%, prox to mid RCA stent ok, dRCA 30% and 70%, pPDA 25%, mPL 25-30%, EF 25%  . Hypertension   . Atrial fibrillation   . Hemorrhoids   . Hyperlipidemia   . Hx of adenomatous colonic polyps   . Hx of colonoscopy   . Chronic renal insufficiency   . Biventricular cardiac pacemaker in situ 3.2013    Past Surgical History  Procedure Date  . Cardiac catheterization 2008  . Cataract extraction   . Salivary gland surgery   . Coronary angioplasty with stent placement   . Insert / replace / remove pacemaker 06/16/2011    History  Substance Use Topics  . Smoking status: Former Smoker -- 1.5 packs/day for 55 years  Quit date: 01/03/2009  . Smokeless tobacco: Never Used  . Alcohol Use: Yes     occasional    Family History  Problem Relation Age of Onset  . Coronary artery disease Father   . Heart disease Mother   . Colon cancer Neg Hx   . Heart disease Brother     Allergies  Allergen Reactions  . Codeine Nausea And Vomiting  . Inspra (Eplerenone)     Stomach problems  . Spironolactone Other (See Comments)    Gynecomastia  . Sulfonamide Derivatives Itching and Rash    Medication list has been reviewed and updated.  Current Outpatient Prescriptions on File Prior to Visit  Medication Sig Dispense Refill  . acetaminophen (TYLENOL) 500 MG tablet Take 1,000 mg by mouth every 12 (twelve) hours as needed. For pain.      [name] ADVAIR DISKUS 250-50 MCG/DOSE AEPB USE ONE  INHALATION TWO TIMES A DAY  3 each  1  . albuterol (2.5 MG/3ML) 0.083% NEBU 3 mL, albuterol (5 MG/ML) 0.5% NEBU 0.5 mL Inhale 2.5 mg into the lungs 3 (three) times daily as needed. For shortness of breath.      . Albuterol Sulfate (PROAIR HFA IN) Inhale 2 puffs into the lungs every 8 (eight) hours as needed. Shortness of breath.      . ALPRAZolam (XANAX) 0.25 MG tablet Take 0.25 mg by mouth at bedtime as needed. For anxiety.      . carvedilol (COREG) 25 MG tablet Take 1 tablet (25 mg total) by mouth 2 (two) times daily with a meal.  120 tablet  1  . esomeprazole (NEXIUM) 40 MG capsule Take 40 mg by mouth daily before breakfast.      . furosemide (LASIX) 40 MG tablet Take 40-80 mg daily.  80 mg if weight >186  135 tablet  1  . hydrALAZINE (APRESOLINE) 25 MG tablet Take 1 tablet (25 mg total) by mouth 3 (three) times daily.  270 tablet  3  . ipratropium-albuterol (DUONEB) 0.5-2.5 (3) MG/3ML SOLN Take 3 mLs by nebulization 3 (three) times daily as needed. For shortness of breath      . nitroGLYCERIN (NITROSTAT) 0.4 MG SL tablet Place 1 tablet (0.4 mg total) under the tongue every 5 (five) minutes as needed for chest pain.  25 tablet  6  . Prenatal Vit-Fe Fumarate-FA (PRENATAL MULTIVITAMIN) TABS Take 1 tablet by mouth every evening.  90 tablet  2  . PROAIR HFA 108 (90 BASE) MCG/ACT inhaler INHALE 1 TO 2 PUFFS EVERY 6 HOURS AS NEEDED  1 Inhaler  0  . Rivaroxaban (XARELTO) 20 MG TABS Take 20 mg by mouth daily.  90 tablet  3  . Tamsulosin HCl (FLOMAX) 0.4 MG CAPS Take 0.4 mg by mouth 2 (two) times daily.       . Alum & Mag Hydroxide-Simeth (MAALOX MAXIMUM STRENGTH PO) Take 30 mLs by mouth daily as needed. For indigestion      . fluticasone (FLONASE) 50 MCG/ACT nasal spray Place 2 sprays into the nose daily.  1 g  6  . HYDROcodone-acetaminophen (VICODIN) 5-500 MG per tablet Take 1 tablet by mouth every 12 (twelve) hours as needed. For pain.      [name] losartan (COZAAR) 50 MG tablet TAKE 1 TABLET TWICE A DAY   180 tablet  3  . DISCONTD: metoprolol tartrate (LOPRESSOR) 25 MG tablet Take 1/2 tablet every other day   45 tablet  3    Review of Systems:  As per  HPI- otherwise negative.    Physical Examination: Filed Vitals:   12/22/11 1145  BP: 128/62  Pulse: 70  Temp: 98.3 F (36.8 C)  Resp: 16   Filed Vitals:   12/22/11 1145  Height: 5' 10.5" (1.791 m)  Weight: 193 lb 9.6 oz (87.816 kg)   Body mass index is 27.39 kg/(m^2). Ideal Body Weight: Weight in (lb) to have BMI = 25: 176.4    GEN: WDWN, NAD, Non-toxic, Alert & Oriented x 3 HEENT: Atraumatic, Normocephalic.  Ears and Nose: No external deformity. He is on xarelto for atrial flutter EXTR: No clubbing/cyanosis/edema NEURO: Normal gait.  PSYCH: Normally interactive. Conversant. Not depressed or anxious appearing.  Calm demeanor.  Left anterior inferior shin:  There is a shallow wound that does not have any purulent drainage over the tibia.  However, there is mild redness and warmth inferior to the wound, and minimal tender edema  [facility] reading (PRIMARY) by  Dr. Patsy Lager.  Left tib/ fib: negative, soft tissue swelling visible on lateral.     Assessment and Plan: 1. Wound of left leg  DG Tibia/Fibula Left, doxycycline (VIBRA-TABS) 100 MG tablet  2. Need for prophylactic vaccination and inoculation against influenza    3. Pain in shin     Mildly infected wound on left shin.  Suspect this is the cause of his tenderness and redness.  Start doxy, continue to elevate and keep wound clean.  Let me know if not doing better in the next couple of days- Sooner if worse. Tetanus shot is UTD, did flu shot today     COPLAND,JESSICA, MD  Obeth called back later- he is actually allergic to doxycycline which he has not remembered.  We will change him to keflex 500 TID for 10 days and added doxy to the allergy list.

## 2011-12-24 ENCOUNTER — Other Ambulatory Visit: Payer: Self-pay | Admitting: Physician Assistant

## 2011-12-24 ENCOUNTER — Other Ambulatory Visit: Payer: Self-pay | Admitting: Nurse Practitioner

## 2011-12-30 ENCOUNTER — Encounter: Payer: Self-pay | Admitting: Cardiology

## 2011-12-30 ENCOUNTER — Ambulatory Visit (INDEPENDENT_AMBULATORY_CARE_PROVIDER_SITE_OTHER): Payer: Medicare Other | Admitting: Cardiology

## 2011-12-30 VITALS — BP 119/70 | HR 59 | Ht 72.0 in | Wt 192.4 lb

## 2011-12-30 DIAGNOSIS — R0602 Shortness of breath: Secondary | ICD-10-CM

## 2011-12-30 DIAGNOSIS — I10 Essential (primary) hypertension: Secondary | ICD-10-CM

## 2011-12-30 DIAGNOSIS — I5022 Chronic systolic (congestive) heart failure: Secondary | ICD-10-CM

## 2011-12-30 DIAGNOSIS — E785 Hyperlipidemia, unspecified: Secondary | ICD-10-CM

## 2011-12-30 LAB — BRAIN NATRIURETIC PEPTIDE: Pro B Natriuretic peptide (BNP): 429 pg/mL — ABNORMAL HIGH (ref 0.0–100.0)

## 2011-12-30 LAB — BASIC METABOLIC PANEL
BUN: 23 mg/dL (ref 6–23)
Calcium: 8.8 mg/dL (ref 8.4–10.5)
Creatinine, Ser: 1.3 mg/dL (ref 0.4–1.5)
GFR: 55.53 mL/min — ABNORMAL LOW (ref >60.00)
Glucose, Bld: 106 mg/dL — ABNORMAL HIGH (ref 70–99)

## 2011-12-30 MED ORDER — PRAVASTATIN SODIUM 20 MG PO TABS: 20.0000 mg | ORAL_TABLET | Freq: Every evening | ORAL | Status: DC

## 2011-12-30 NOTE — Patient Instructions (Addendum)
Please re-start Pravastatin 20 mg a day All other medications are to be continued as listed.  Please have blood work today (BMP,BNP)  Please return fasting in 3 months for lipid profile.  Follow up in 3 months with Dr Antoine Poche.

## 2011-12-30 NOTE — Progress Notes (Signed)
HPI The patient presents for followup of his cardiomyopathy. Since I last saw him he has done well.  He might have some slightly increased dyspnea compared to previous but is not describing new PND or orthopnea. His weights have been quite steady. He somewhat disturbed because his blood pressures have been fluctuating sometimes being high in the morning.  He occasionally has some low blood pressures with lightheadedness but no presyncope or syncope. He's had no new chest pressure, neck or arm discomfort. He's had no palpitations. He actually feels like his heart rate is regular since his device placement.   Allergies  Allergen Reactions  . Codeine Nausea And Vomiting  . Inspra (Eplerenone)     Stomach problems  . Spironolactone Other (See Comments)    Gynecomastia  . Doxycycline Itching and Rash  . Sulfonamide Derivatives Itching and Rash    Current Outpatient Prescriptions  Medication Sig Dispense Refill  . acetaminophen (TYLENOL) 500 MG tablet Take 1,000 mg by mouth every 12 (twelve) hours as needed. For pain.      [name] ADVAIR DISKUS 250-50 MCG/DOSE AEPB USE ONE INHALATION TWO TIMES A DAY  3 each  1  . albuterol (2.5 MG/3ML) 0.083% NEBU 3 mL, albuterol (5 MG/ML) 0.5% NEBU 0.5 mL Inhale 2.5 mg into the lungs 3 (three) times daily as needed. For shortness of breath.      . Albuterol Sulfate (PROAIR HFA IN) Inhale 2 puffs into the lungs every 8 (eight) hours as needed. Shortness of breath.      . ALPRAZolam (XANAX) 0.25 MG tablet Take 0.25 mg by mouth at bedtime as needed. For anxiety.      . Alum & Mag Hydroxide-Simeth (MAALOX MAXIMUM STRENGTH PO) Take 30 mLs by mouth daily as needed. For indigestion      . carvedilol (COREG) 25 MG tablet Take 1 tablet (25 mg total) by mouth 2 (two) times daily with a meal.  120 tablet  1  . cephALEXin (KEFLEX) 500 MG capsule Take 1 capsule (500 mg total) by mouth 3 (three) times daily.  30 capsule  0  . fluticasone (FLONASE) 50 MCG/ACT nasal spray Place 2  sprays into the nose daily.  1 g  6  . furosemide (LASIX) 40 MG tablet Take 40-80 mg daily.  80 mg if weight >186  135 tablet  1  . hydrALAZINE (APRESOLINE) 25 MG tablet Take 25 mg by mouth 2 (two) times daily.      [name] HYDROcodone-acetaminophen (VICODIN) 5-500 MG per tablet Take 1 tablet by mouth every 12 (twelve) hours as needed. For pain.      [name] ipratropium-albuterol (DUONEB) 0.5-2.5 (3) MG/3ML SOLN Take 3 mLs by nebulization 3 (three) times daily as needed. For shortness of breath      . losartan (COZAAR) 50 MG tablet TAKE 1 TABLET TWICE A DAY  180 tablet  3  . NEXIUM 40 MG capsule TAKE 1 CAPSULE TWICE A DAY  180 each  1  . nitroGLYCERIN (NITROSTAT) 0.4 MG SL tablet Place 1 tablet (0.4 mg total) under the tongue every 5 (five) minutes as needed for chest pain.  25 tablet  6  . Prenatal Vit-Fe Fumarate-FA (PRENATAL MULTIVITAMIN) TABS Take 1 tablet by mouth every evening.  90 tablet  2  . PROAIR HFA 108 (90 BASE) MCG/ACT inhaler INHALE 1 TO 2 PUFFS EVERY 6 HOURS AS NEEDED  1 Inhaler  1  . Rivaroxaban (XARELTO) 20 MG TABS Take 20 mg by mouth daily.  90  tablet  3  . Tamsulosin HCl (FLOMAX) 0.4 MG CAPS Take 0.4 mg by mouth 2 (two) times daily.       [name] DISCONTD: hydrALAZINE (APRESOLINE) 25 MG tablet Take 1 tablet (25 mg total) by mouth 3 (three) times daily.  270 tablet  3  . DISCONTD: metoprolol tartrate (LOPRESSOR) 25 MG tablet Take 1/2 tablet every other day   45 tablet  3    Past Medical History  Diagnosis Date  . Chronic systolic heart failure     chronic LV systolic failure; last echo in December of 2011 showed EF of 25%; echo 02/2011: Mild LVH, EF 15%, posterior lateral akinesis, inferior akinesis, grade 1 diastolic dysfunction, mild LAE   . Ischemic heart disease   . Atrial flutter     off amiodarone because of increased LFTs  . Shortness of breath     chronic sob with known COPD and probable chronic bronchitis  . COPD (chronic obstructive pulmonary disease)   . Bronchitis, chronic     . Myocardial infarction 1994  . Gastro - esophageal reflux   . Claudication   . BPH (benign prostatic hyperplasia)   . Coronary artery disease     status post myocardial infarction in 1984,  99 and 07.  He has multiple RCA stents;  LHC 04/2011:oLM 25%, mLAD 30%, pCFX 25%, prox to mid RCA stent ok, dRCA 30% and 70%, pPDA 25%, mPL 25-30%, EF 25%  . Hypertension   . Atrial fibrillation   . Hemorrhoids   . Hyperlipidemia   . Hx of adenomatous colonic polyps   . Hx of colonoscopy   . Chronic renal insufficiency   . Biventricular cardiac pacemaker in situ 3.2013    Past Surgical History  Procedure Date  . Cardiac catheterization 2008  . Cataract extraction   . Salivary gland surgery   . Coronary angioplasty with stent placement   . Insert / replace / remove pacemaker 06/16/2011    ROS:  As stated in the HPI and negative for all other systems.  PHYSICAL EXAM BP 119/70  Pulse 59  Ht 6' (1.829 m)  Wt 192 lb 6.4 oz (87.272 kg)  BMI 26.09 kg/m2 GENERAL: Frail appearing  HEENT: Pupils equal round and reactive, fundi not visualized, oral mucosa unremarkable, dentures  NECK:  Jugular venous distention 10 cm, waveform within normal limits, carotid upstroke brisk and symmetric, no bruits, no thyromegaly  LYMPHATICS: No cervical, inguinal adenopathy  LUNGS: Clear to auscultation bilaterally  BACK: No CVA tenderness  CHEST: Well healed ICD scar HEART: PMI not displaced or sustained,S1 and S2 within normal limits, no S3, no S4, no clicks, no rubs, no murmurs  ABD: Flat, positive bowel sounds normal in frequency in pitch, no bruits, no rebound, no guarding, no midline pulsatile mass, no hepatomegaly, no splenomegaly  EXT: 2 plus pulses throughout, mild ankle edema, no cyanosis no clubbing, chronic venous stasis changes.  SKIN: Bruising  EKG:  Sinus rhythm, paced ventricular rhythm, rate 59.  12/30/2011   ASSESSMENT AND PLAN  Cardiomyopathy - At this point he is doing well and seems to  be fairly euvolemic. I will keep him on the medications as listed. We did review again when necessary dosing of diuretic. He's been taking a fair amount of the extra Lasix trying to get back to await 186 pounds even though the weight does not fluctuate. I will check a basic metabolic profile today.   Hypertension -  His blood pressure is somewhat labile. At this  point I would not suggest further med titration. He will continue with meds as listed. He could take half of a when necessary hydralazine if his blood pressure is sustained above the 160s.  Atrial fibrillation -  He will continue the meds as listed. He's had no symptomatic paroxysms.  Hyperlipidemia  - His LDL most recently was 135. He consents to restart pravastatin 20 mg which he thought might have caused muscle aches in the past. If he remains on this at the next appointment I will check another fasting lipid profile.

## 2012-01-06 ENCOUNTER — Encounter: Payer: Self-pay | Admitting: Cardiology

## 2012-01-08 ENCOUNTER — Other Ambulatory Visit: Payer: Self-pay | Admitting: Cardiology

## 2012-01-22 ENCOUNTER — Other Ambulatory Visit: Payer: Self-pay | Admitting: Cardiology

## 2012-01-22 ENCOUNTER — Other Ambulatory Visit: Payer: Self-pay | Admitting: Physician Assistant

## 2012-02-29 ENCOUNTER — Telehealth: Payer: Self-pay | Admitting: Cardiology

## 2012-02-29 NOTE — Telephone Encounter (Signed)
Per pt states he has been taking his medications as ordered but he is having BP variations.  BP have been anywhere from 130/59 to 198/100.  He has been having H/A's when his pressure is elevated.  He reports taking extra Hydralazine (taking 2 every am and 2 in the early afternoon.  Pt aware I will forward this information to Dr Antoine Poche and call him back with orders.  He has an appointment with Dr Antoine Poche in December.

## 2012-02-29 NOTE — Telephone Encounter (Signed)
New problem:    C/O blood pressure issues .  8 am today  145/66. -  15 min ago  139/69.

## 2012-03-01 MED ORDER — HYDRALAZINE HCL 25 MG PO TABS: 50.0000 mg | ORAL_TABLET | Freq: Three times a day (TID) | ORAL | Status: DC

## 2012-03-01 NOTE — Telephone Encounter (Signed)
I would like for him to increase his hydralazine to 50 mg tid po.

## 2012-03-01 NOTE — Telephone Encounter (Signed)
Pt aware of orders.  He does not want a new RX sent in at this time.  He will call when he needs one.  He will continue to monitor his BP.

## 2012-03-06 ENCOUNTER — Ambulatory Visit (INDEPENDENT_AMBULATORY_CARE_PROVIDER_SITE_OTHER): Payer: Medicare Other

## 2012-03-08 ENCOUNTER — Ambulatory Visit (INDEPENDENT_AMBULATORY_CARE_PROVIDER_SITE_OTHER): Payer: Medicare Other | Admitting: *Deleted

## 2012-03-08 ENCOUNTER — Ambulatory Visit (INDEPENDENT_AMBULATORY_CARE_PROVIDER_SITE_OTHER): Payer: Medicare Other | Admitting: Emergency Medicine

## 2012-03-08 ENCOUNTER — Encounter: Payer: Self-pay | Admitting: Internal Medicine

## 2012-03-08 VITALS — BP 133/75 | HR 65 | Temp 97.4°F | Resp 18 | Wt 196.0 lb

## 2012-03-08 DIAGNOSIS — I493 Ventricular premature depolarization: Secondary | ICD-10-CM

## 2012-03-08 DIAGNOSIS — R0602 Shortness of breath: Secondary | ICD-10-CM

## 2012-03-08 DIAGNOSIS — I499 Cardiac arrhythmia, unspecified: Secondary | ICD-10-CM

## 2012-03-08 DIAGNOSIS — I4949 Other premature depolarization: Secondary | ICD-10-CM

## 2012-03-08 DIAGNOSIS — I509 Heart failure, unspecified: Secondary | ICD-10-CM

## 2012-03-08 LAB — PACEMAKER DEVICE OBSERVATION
AL THRESHOLD: 0.625 V
ATRIAL PACING PM: 1.2
BAMS-0001: 170 {beats}/min
RV LEAD IMPEDENCE PM: 475 Ohm

## 2012-03-08 LAB — BASIC METABOLIC PANEL
BUN: 21 mg/dL (ref 6–23)
CO2: 28 mEq/L (ref 19–32)
Calcium: 8.4 mg/dL (ref 8.4–10.5)
Creatinine, Ser: 1.4 mg/dL (ref 0.4–1.5)
Glucose, Bld: 117 mg/dL — ABNORMAL HIGH (ref 70–99)
Sodium: 133 mEq/L — ABNORMAL LOW (ref 135–145)

## 2012-03-08 NOTE — Progress Notes (Signed)
  Subjective:    Patient ID: Andrew [name], male    DOB: 24-Apr-1930, 78 y.o.   MRN: [id number]  HPI Patient comes into our office with complains of irregular blood pressure readings for the last 2-3 days Patient is on a pace maker with defibrillator since March HX of atrial fib Both feet has been swollen and burning for the last 2-3 days when the abnormal readings occurred No pain, no pressure, no numbness or sweats    Review of Systems  Constitutional: Positive for fatigue. Negative for fever.  Respiratory: Positive for shortness of breath. Negative for apnea and chest tightness.   Neurological: Negative for weakness and numbness.       Objective:   Physical Exam patient is alert and cooperative and not in any distress. Chest exam reveals decreased breath sounds in the bases but no wheezes. Cardiac exam reveals an irregular rhythm in a bigeminal pattern. There is a pacemaker present left anterior chest.  EKG patient has a paced rhythm with bigeminal PVCs      Assessment & Plan:  Case discussed with Dr. Johney [name]. He is going to see Andrew [name] and do a pacemaker check.

## 2012-03-08 NOTE — Progress Notes (Signed)
ICD check with ICM 

## 2012-03-08 NOTE — Patient Instructions (Addendum)
Your physician recommends that you schedule a follow-up appointment in: 1 week with  Lilian Coma   Increase your furosemide to 40mg  twice daily for 5 days.

## 2012-03-10 ENCOUNTER — Emergency Department (HOSPITAL_COMMUNITY)
Admission: EM | Admit: 2012-03-10 | Discharge: 2012-03-10 | Disposition: A | Discharge status: Home / Self Care | Payer: Medicare Other | Attending: Emergency Medicine | Admitting: Emergency Medicine

## 2012-03-10 ENCOUNTER — Telehealth: Payer: Self-pay

## 2012-03-10 ENCOUNTER — Emergency Department (HOSPITAL_COMMUNITY): Payer: Medicare Other

## 2012-03-10 DIAGNOSIS — J4489 Other specified chronic obstructive pulmonary disease: Secondary | ICD-10-CM | POA: Insufficient documentation

## 2012-03-10 DIAGNOSIS — I252 Old myocardial infarction: Secondary | ICD-10-CM | POA: Insufficient documentation

## 2012-03-10 DIAGNOSIS — K219 Gastro-esophageal reflux disease without esophagitis: Secondary | ICD-10-CM | POA: Insufficient documentation

## 2012-03-10 DIAGNOSIS — Z79899 Other long term (current) drug therapy: Secondary | ICD-10-CM | POA: Insufficient documentation

## 2012-03-10 DIAGNOSIS — N138 Other obstructive and reflux uropathy: Secondary | ICD-10-CM | POA: Insufficient documentation

## 2012-03-10 DIAGNOSIS — E785 Hyperlipidemia, unspecified: Secondary | ICD-10-CM | POA: Insufficient documentation

## 2012-03-10 DIAGNOSIS — I509 Heart failure, unspecified: Secondary | ICD-10-CM | POA: Insufficient documentation

## 2012-03-10 DIAGNOSIS — R0602 Shortness of breath: Secondary | ICD-10-CM | POA: Insufficient documentation

## 2012-03-10 DIAGNOSIS — Z87891 Personal history of nicotine dependence: Secondary | ICD-10-CM | POA: Insufficient documentation

## 2012-03-10 DIAGNOSIS — Z8679 Personal history of other diseases of the circulatory system: Secondary | ICD-10-CM | POA: Insufficient documentation

## 2012-03-10 DIAGNOSIS — Z860100 Personal history of colon polyps, unspecified: Secondary | ICD-10-CM | POA: Insufficient documentation

## 2012-03-10 DIAGNOSIS — J449 Chronic obstructive pulmonary disease, unspecified: Secondary | ICD-10-CM | POA: Insufficient documentation

## 2012-03-10 DIAGNOSIS — J42 Unspecified chronic bronchitis: Secondary | ICD-10-CM | POA: Insufficient documentation

## 2012-03-10 DIAGNOSIS — N401 Enlarged prostate with lower urinary tract symptoms: Secondary | ICD-10-CM | POA: Insufficient documentation

## 2012-03-10 DIAGNOSIS — N189 Chronic kidney disease, unspecified: Secondary | ICD-10-CM | POA: Insufficient documentation

## 2012-03-10 DIAGNOSIS — I129 Hypertensive chronic kidney disease with stage 1 through stage 4 chronic kidney disease, or unspecified chronic kidney disease: Secondary | ICD-10-CM | POA: Insufficient documentation

## 2012-03-10 DIAGNOSIS — Z9889 Other specified postprocedural states: Secondary | ICD-10-CM | POA: Insufficient documentation

## 2012-03-10 DIAGNOSIS — I2510 Atherosclerotic heart disease of native coronary artery without angina pectoris: Secondary | ICD-10-CM | POA: Insufficient documentation

## 2012-03-10 DIAGNOSIS — I498 Other specified cardiac arrhythmias: Secondary | ICD-10-CM | POA: Insufficient documentation

## 2012-03-10 LAB — POCT I-STAT, CHEM 8
BUN: 23 mg/dL (ref 6–23)
Chloride: 99 mEq/L (ref 96–112)
Potassium: 3.8 mEq/L (ref 3.5–5.1)
Sodium: 134 mEq/L — ABNORMAL LOW (ref 135–145)

## 2012-03-10 LAB — POCT I-STAT TROPONIN I: Troponin i, poc: 0.01 ng/mL (ref 0.00–0.08)

## 2012-03-10 LAB — CBC
HCT: 32.1 % — ABNORMAL LOW (ref 39.0–52.0)
MCV: 92 fL (ref 78.0–100.0)
RBC: 3.49 MIL/uL — ABNORMAL LOW (ref 4.22–5.81)
WBC: 8.6 10*3/uL (ref 4.0–10.5)

## 2012-03-10 LAB — PROTIME-INR: INR: 1.68 — ABNORMAL HIGH (ref 0.00–1.49)

## 2012-03-10 NOTE — ED Provider Notes (Signed)
History     CSN: [id number]  Arrival date & time 03/10/12  4098   First MD Initiated Contact with Patient 03/10/12 [phone]      Chief Complaint  Patient presents with  . Weakness  . Shortness of Breath    (Consider location/radiation/quality/duration/timing/severity/associated sxs/prior treatment) HPI Pt reports this AM he was feeling weak and checked his vitals with a fingertip monitor which showed a HR in the 40s. He denies any CP to me (triage note mentions pressure). He has had some mild exertional SOB recently which is unchanged. He called EMS who found his HR to be in the 70s. He has had normal BP, no vomiting, diarrhea, fever, cough. No falls.   Past Medical History  Diagnosis Date  . Chronic systolic heart failure     chronic LV systolic failure; last echo in December of 2011 showed EF of 25%; echo 02/2011: Mild LVH, EF 15%, posterior lateral akinesis, inferior akinesis, grade 1 diastolic dysfunction, mild LAE   . Ischemic heart disease   . Atrial flutter     off amiodarone because of increased LFTs  . Shortness of breath     chronic sob with known COPD and probable chronic bronchitis  . COPD (chronic obstructive pulmonary disease)   . Bronchitis, chronic   . Myocardial infarction 1994  . Gastro - esophageal reflux   . Claudication   . BPH (benign prostatic hyperplasia)   . Coronary artery disease     status post myocardial infarction in 1984,  99 and 07.  He has multiple RCA stents;  LHC 04/2011:oLM 25%, mLAD 30%, pCFX 25%, prox to mid RCA stent ok, dRCA 30% and 70%, pPDA 25%, mPL 25-30%, EF 25%  . Hypertension   . Atrial fibrillation   . Hemorrhoids   . Hyperlipidemia   . Hx of adenomatous colonic polyps   . Hx of colonoscopy   . Chronic renal insufficiency   . Biventricular cardiac pacemaker in situ 3.2013    Past Surgical History  Procedure Date  . Cardiac catheterization 2008  . Cataract extraction   . Salivary gland surgery   . Coronary angioplasty with  stent placement   . Insert / replace / remove pacemaker 06/16/2011    Family History  Problem Relation Age of Onset  . Coronary artery disease Father   . Heart disease Mother   . Colon cancer Neg Hx   . Heart disease Brother     History  Substance Use Topics  . Smoking status: Former Smoker -- 1.5 packs/day for 55 years    Quit date: 01/03/2009  . Smokeless tobacco: Never Used  . Alcohol Use: Yes     Comment: occasional      Review of Systems All other systems reviewed and are negative except as noted in HPI.   Allergies  Codeine; Inspra; Spironolactone; Doxycycline; and Sulfonamide derivatives  Home Medications   Current Outpatient Rx  Name  Route  Sig  Dispense  Refill  . ADVAIR DISKUS 250-50 MCG/DOSE IN AEPB      USE ONE INHALATION TWO TIMES A DAY   3 each   1   . ALBUTEROL SULFATE (5 MG/3.5 ML) NEBULIZER SOLUTION   Inhalation   Inhale 2.5 mg into the lungs 3 (three) times daily as needed. For shortness of breath.         [name] PROAIR HFA IN   Inhalation   Inhale 2 puffs into the lungs every 8 (eight) hours as needed. Shortness of breath.         [name]  ALPRAZOLAM 0.25 MG PO TABS   Oral   Take 0.25 mg by mouth at bedtime as needed. For anxiety.         [name] CARVEDILOL 25 MG PO TABS      TAKE 1 TABLET TWICE A DAY WITH MEALS   120 tablet   0   . FLUTICASONE PROPIONATE 50 MCG/ACT NA SUSP   Nasal   Place 2 sprays into the nose daily.   1 g   6   . FUROSEMIDE 40 MG PO TABS      TAKE 1 TO 2 TABLETS DAILY, 2 TABLETS IF WEIGHT IS GREATER THAN 186   135 tablet   0   . HYDRALAZINE HCL 25 MG PO TABS   Oral   Take 2 tablets (50 mg total) by mouth 3 (three) times daily.         [name] HYDROCODONE-ACETAMINOPHEN 5-500 MG PO TABS   Oral   Take 1 tablet by mouth every 12 (twelve) hours as needed. For pain.         . IPRATROPIUM-ALBUTEROL 0.5-2.5 (3) MG/3ML IN SOLN   Nebulization   Take 3 mLs by nebulization 3 (three) times daily as needed. For shortness of  breath         . LOSARTAN POTASSIUM 50 MG PO TABS      TAKE 1 TABLET TWICE A DAY   180 tablet   3   . NEXIUM 40 MG PO CPDR      TAKE 1 CAPSULE TWICE A DAY   180 each   1   . PRAVASTATIN SODIUM 20 MG PO TABS      TAKE 1 TABLET EVERY EVENING   30 tablet   10   . PRENATAL MULTIVITAMIN CH   Oral   Take 1 tablet by mouth every evening.   90 tablet   2   . RIVAROXABAN 20 MG PO TABS   Oral   Take 20 mg by mouth daily.   90 tablet   3   . TAMSULOSIN HCL 0.4 MG PO CAPS   Oral   Take 0.4 mg by mouth 2 (two) times daily.          [name] NITROGLYCERIN 0.4 MG SL SUBL   Sublingual   Place 1 tablet (0.4 mg total) under the tongue every 5 (five) minutes as needed for chest pain.   25 tablet   6     BP 163/63  Pulse 78  Temp 97.7 F (36.5 C) (Oral)  Resp 20  SpO2 95%  Physical Exam  Nursing note and vitals reviewed. Constitutional: He is oriented to person, place, and time. He appears well-developed and well-nourished.  HENT:  Head: Normocephalic and atraumatic.  Eyes: EOM are normal. Pupils are equal, round, and reactive to light.  Neck: Normal range of motion. Neck supple.  Cardiovascular: Normal rate, normal heart sounds and intact distal pulses.   Pulmonary/Chest: Effort normal and breath sounds normal.  Abdominal: Bowel sounds are normal. He exhibits no distension. There is no tenderness.  Musculoskeletal: Normal range of motion. He exhibits no edema and no tenderness.  Neurological: He is alert and oriented to person, place, and time. He has normal strength. No cranial nerve deficit or sensory deficit.  Skin: Skin is warm and dry. No rash noted.  Psychiatric: He has a normal mood and affect.    ED Course  Procedures (including critical care time)  Labs Reviewed  CBC - Abnormal; Notable for the following:  RBC 3.49 (*)     Hemoglobin 10.6 (*)     HCT 32.1 (*)     All other components within normal limits  PRO B NATRIURETIC PEPTIDE - Abnormal; Notable  for the following:    Pro B Natriuretic peptide (BNP) 3523.0 (*)     All other components within normal limits  PROTIME-INR - Abnormal; Notable for the following:    Prothrombin Time 19.2 (*)     INR 1.68 (*)     All other components within normal limits  POCT I-STAT, CHEM 8 - Abnormal; Notable for the following:    Sodium 134 (*)     Creatinine, Ser 1.40 (*)     Glucose, Bld 129 (*)     Hemoglobin 10.9 (*)     HCT 32.0 (*)     All other components within normal limits  POCT I-STAT TROPONIN I   Dg Chest Port 1 View  03/10/2012  *RADIOLOGY REPORT*  Clinical Data: Slow heart rate.  Weakness.  Labored breathing. High blood pressure.  PORTABLE CHEST - 1 VIEW  Comparison: 07/15/2011.  Findings: Biventricular pacer is in place.  Leads appear unchanged in position without obvious interruption.  Cardiomegaly.  Pulmonary vascular congestion superimposed on chronic changes.  Calcified slightly tortuous aorta.  No gross pneumothorax.  Biapical pleural thickening without associated bony destruction without change.  IMPRESSION: Biventricular pacer is in place.  Leads appear unchanged in position without obvious interruption.  Cardiomegaly.  Pulmonary vascular congestion superimposed on chronic changes.   Original Report Authenticated By: Lacy Duverney, M.D.      No diagnosis found.    MDM   Date: 03/10/2012  Rate: 88  Rhythm: Paced with bigeminy  QRS Axis: indeterminate  Intervals: indeterminate  ST/T Wave abnormalities: indeterminate  Conduction Disutrbances:Indeterminate  Narrative Interpretation:   Old EKG Reviewed: unchanged from EKG in [place] office 2 days ago  Pt with bigeminy on monitor, but pulse ox is only reading every other beat. This is likely the source of his home reading in the 40s as well. Will check labs and imaging.    11:44 AM Rhythm now paced without bigeminy. He has mild vascular congestion on CXR, but symptomatically doing well. Discussed with Dr. Gala Romney who states  that the patient can continue with increased Lasix as directed at [facility] office 2 days ago. If symptoms persist over the weekend advised followup in the Office. Patient is amenable to this plan, ready to go home.        Jailen Lung B. Bernette Mayers, MD 03/10/12 1146

## 2012-03-10 NOTE — Telephone Encounter (Signed)
Pt CB and stated that he has improved since he called [place], but he is on the way to the ER to have them check him. He thanked [place] for calling him back.

## 2012-03-10 NOTE — ED Notes (Addendum)
Pt called EMS c/o weakness and HR found to be 40. Seen outpt at urgent care they increased lasix. Also c/o difficulty breathing with exertion. Pt paced for EMS rate 70s. 20g Left forearm. Pt c/o pressure across substernal chest. SOB is worsened by lying flat.

## 2012-03-10 NOTE — Telephone Encounter (Signed)
Yes, he can come here or to ER or to Cardiology. I have called, left message. His is advised to call me back also.

## 2012-03-10 NOTE — Telephone Encounter (Signed)
Pt states he was seen recently for 'heart problems' and would like dr copland to call him. States he woke up this morning and his pulse was 40 and his last bp reading (a few minutes before his phone call) was 94-46. Asks if he should RTC? States he saw a cardiologist recently, but doesn't want to call them because its too hard to get in to see them.   Please call pt. [phone] bf

## 2012-03-15 ENCOUNTER — Other Ambulatory Visit: Payer: Self-pay | Admitting: Physician Assistant

## 2012-03-17 ENCOUNTER — Encounter: Payer: Self-pay | Admitting: Cardiology

## 2012-03-17 ENCOUNTER — Other Ambulatory Visit (INDEPENDENT_AMBULATORY_CARE_PROVIDER_SITE_OTHER): Payer: Medicare Other

## 2012-03-17 ENCOUNTER — Ambulatory Visit (INDEPENDENT_AMBULATORY_CARE_PROVIDER_SITE_OTHER): Payer: Medicare Other | Admitting: Cardiology

## 2012-03-17 VITALS — BP 130/66 | HR 66 | Ht 72.0 in | Wt 193.0 lb

## 2012-03-17 DIAGNOSIS — I4892 Unspecified atrial flutter: Secondary | ICD-10-CM

## 2012-03-17 DIAGNOSIS — I2589 Other forms of chronic ischemic heart disease: Secondary | ICD-10-CM

## 2012-03-17 DIAGNOSIS — I4891 Unspecified atrial fibrillation: Secondary | ICD-10-CM

## 2012-03-17 DIAGNOSIS — I255 Ischemic cardiomyopathy: Secondary | ICD-10-CM

## 2012-03-17 DIAGNOSIS — I2510 Atherosclerotic heart disease of native coronary artery without angina pectoris: Secondary | ICD-10-CM

## 2012-03-17 DIAGNOSIS — E785 Hyperlipidemia, unspecified: Secondary | ICD-10-CM

## 2012-03-17 LAB — LIPID PANEL
Cholesterol: 136 mg/dL (ref 0–200)
HDL: 30.7 mg/dL — ABNORMAL LOW (ref >39.00)
Triglycerides: 113 mg/dL (ref 0.0–149.0)

## 2012-03-17 LAB — BASIC METABOLIC PANEL
BUN: 19 mg/dL (ref 6–23)
CO2: 28 mEq/L (ref 19–32)
Calcium: 8.8 mg/dL (ref 8.4–10.5)
Creatinine, Ser: 1.4 mg/dL (ref 0.4–1.5)
Glucose, Bld: 110 mg/dL — ABNORMAL HIGH (ref 70–99)
Sodium: 132 mEq/L — ABNORMAL LOW (ref 135–145)

## 2012-03-17 NOTE — Patient Instructions (Addendum)
Please take Furosemide 80 mg for the next 3 mornings.  Continue to take 40 mg in the afternoon. Continue all other medications as listed.  Please have blood work today (BMP and lipid)  Follow up in 1 week with Norma Fredrickson, NP

## 2012-03-17 NOTE — Progress Notes (Signed)
HPI The patient presents for followup of his cardiomyopathy. Since I last saw him he had an increased Optivol and we increased his diuretic. He went to the emergency room a few days ago because he thought his heart rate was low but this was probably because of ventricular ectopy. He's had some increased stiffness and discomfort in his ankles. He felt little discomfort in his abdomen. He's not having any new PND or orthopnea. He's not having any chest pressure, neck or arm discomfort. He did lose about 4 pounds after we increased his diuretic. His creatinine jumped a little bit to 1.4. I reviewed his recent labs and his ER visit. He's not having any chest pressure, neck or arm discomfort. He's not having any presyncope or syncope.   Allergies  Allergen Reactions  . Codeine Nausea And Vomiting  . Inspra (Eplerenone)     Stomach problems  . Spironolactone Other (See Comments)    Gynecomastia  . Doxycycline Itching and Rash  . Sulfonamide Derivatives Itching and Rash    Current Outpatient Prescriptions  Medication Sig Dispense Refill  . ADVAIR DISKUS 250-50 MCG/DOSE AEPB USE ONE INHALATION TWO TIMES A DAY  3 each  0  . albuterol (2.5 MG/3ML) 0.083% NEBU 3 mL, albuterol (5 MG/ML) 0.5% NEBU 0.5 mL Inhale 2.5 mg into the lungs 3 (three) times daily as needed. For shortness of breath.      . Albuterol Sulfate (PROAIR HFA IN) Inhale 2 puffs into the lungs every 8 (eight) hours as needed. Shortness of breath.      . ALPRAZolam (XANAX) 0.25 MG tablet Take 0.25 mg by mouth at bedtime as needed. For anxiety.      . carvedilol (COREG) 25 MG tablet TAKE 1 TABLET TWICE A DAY WITH MEALS  120 tablet  0  . fluticasone (FLONASE) 50 MCG/ACT nasal spray Place 2 sprays into the nose daily.  1 g  6  . furosemide (LASIX) 40 MG tablet TAKE 1 TO 2 TABLETS DAILY, 2 TABLETS IF WEIGHT IS GREATER THAN 186  135 tablet  0  . hydrALAZINE (APRESOLINE) 25 MG tablet Take 2 tablets (50 mg total) by mouth 3 (three) times  daily.      [name] HYDROcodone-acetaminophen (VICODIN) 5-500 MG per tablet Take 1 tablet by mouth every 12 (twelve) hours as needed. For pain.      [name] ipratropium-albuterol (DUONEB) 0.5-2.5 (3) MG/3ML SOLN Take 3 mLs by nebulization 3 (three) times daily as needed. For shortness of breath      . losartan (COZAAR) 50 MG tablet TAKE 1 TABLET TWICE A DAY  180 tablet  3  . NEXIUM 40 MG capsule TAKE 1 CAPSULE TWICE A DAY  180 each  1  . nitroGLYCERIN (NITROSTAT) 0.4 MG SL tablet Place 1 tablet (0.4 mg total) under the tongue every 5 (five) minutes as needed for chest pain.  25 tablet  6  . pravastatin (PRAVACHOL) 20 MG tablet TAKE 1 TABLET EVERY EVENING  30 tablet  10  . Prenatal Vit-Fe Fumarate-FA (PRENATAL MULTIVITAMIN) TABS Take 1 tablet by mouth every evening.  90 tablet  2  . Rivaroxaban (XARELTO) 20 MG TABS Take 20 mg by mouth daily.  90 tablet  3  . Tamsulosin HCl (FLOMAX) 0.4 MG CAPS Take 0.4 mg by mouth 2 (two) times daily.       . [DISCONTINUED] metoprolol tartrate (LOPRESSOR) 25 MG tablet Take 1/2 tablet every other day   45 tablet  3  Past Medical History  Diagnosis Date  . Chronic systolic heart failure     chronic LV systolic failure; last echo in December of 2011 showed EF of 25%; echo 02/2011: Mild LVH, EF 15%, posterior lateral akinesis, inferior akinesis, grade 1 diastolic dysfunction, mild LAE   . Ischemic heart disease   . Atrial flutter     off amiodarone because of increased LFTs  . Shortness of breath     chronic sob with known COPD and probable chronic bronchitis  . COPD (chronic obstructive pulmonary disease)   . Bronchitis, chronic   . Myocardial infarction 1994  . Gastro - esophageal reflux   . Claudication   . BPH (benign prostatic hyperplasia)   . Coronary artery disease     status post myocardial infarction in 1984,  99 and 07.  He has multiple RCA stents;  LHC 04/2011:oLM 25%, mLAD 30%, pCFX 25%, prox to mid RCA stent ok, dRCA 30% and 70%, pPDA 25%, mPL 25-30%, EF  25%  . Hypertension   . Atrial fibrillation   . Hemorrhoids   . Hyperlipidemia   . Hx of adenomatous colonic polyps   . Hx of colonoscopy   . Chronic renal insufficiency   . Biventricular cardiac pacemaker in situ 3.2013    Past Surgical History  Procedure Date  . Cardiac catheterization 2008  . Cataract extraction   . Salivary gland surgery   . Coronary angioplasty with stent placement   . Insert / replace / remove pacemaker 06/16/2011    ROS:  As stated in the HPI and negative for all other systems.  PHYSICAL EXAM BP 130/66  Pulse 66  Ht 6' (1.829 m)  Wt 193 lb (87.544 kg)  BMI 26.18 kg/m2  SpO2 95% GENERAL: Frail appearing  HEENT: Pupils equal round and reactive, fundi not visualized, oral mucosa unremarkable, dentures  NECK:  Jugular venous distention 10 cm, waveform within normal limits, carotid upstroke brisk and symmetric, no bruits, no thyromegaly  LYMPHATICS: No cervical, inguinal adenopathy  LUNGS: Clear to auscultation bilaterally  BACK: No CVA tenderness  CHEST: Well healed ICD scar HEART: PMI not displaced or sustained,S1 and S2 within normal limits, no S3, no S4, no clicks, no rubs, no murmurs  ABD: Flat, positive bowel sounds normal in frequency in pitch, no bruits, no rebound, no guarding, no midline pulsatile mass, no hepatomegaly, no splenomegaly  EXT: 2 plus pulses throughout, mild ankle edema, no cyanosis no clubbing, chronic venous stasis changes.  SKIN: Bruising  EKG:  Sinus rhythm, paced ventricular rhythm, rate 59.  03/17/2012   ASSESSMENT AND PLAN  Cardiomyopathy - I do believe that he has increased volume. Today I will increase his Lasix to 80 mg in the morning and 40 6 hours later. This will be for 3 days and then he will go back to 40 twice a day. He'll get a basic metabolic profile today and then we will have him come back to see Norma Fredrickson next week for close followup.  Hypertension -  He has only been taking his hydralazine mid day  dose 25 mg as his blood pressure in the morning is low. I have encouraged him, because his blood pressure is 170 his in the afternoon, to take the hydralazine 50 3 times a day. I also told him I don't think he needs to monitor this as frequently.  Atrial fibrillation -  He will continue the meds as listed. He's had no symptomatic paroxysms.  Hyperlipidemia  - He restarted  pravastatin recently. He would check a lipid profile today.

## 2012-03-20 ENCOUNTER — Other Ambulatory Visit: Payer: Self-pay | Admitting: *Deleted

## 2012-03-20 DIAGNOSIS — I2589 Other forms of chronic ischemic heart disease: Secondary | ICD-10-CM

## 2012-03-20 MED ORDER — POTASSIUM CHLORIDE ER 10 MEQ PO TBCR: 10.0000 meq | EXTENDED_RELEASE_TABLET | Freq: Every day | ORAL | Status: DC

## 2012-03-21 ENCOUNTER — Ambulatory Visit: Payer: Medicare Other | Admitting: Cardiology

## 2012-03-21 ENCOUNTER — Other Ambulatory Visit: Payer: Medicare Other

## 2012-03-22 ENCOUNTER — Telehealth: Payer: Self-pay | Admitting: Cardiology

## 2012-03-22 NOTE — Telephone Encounter (Signed)
Pt wants to know if he can take two lasix during the day due to fluid build up

## 2012-03-22 NOTE — Telephone Encounter (Signed)
PT CALLED  RE  SLIGHT SOB WITH  SOME CHEST CONGESTION  FEELS LIKE IT IS FLUID  NO OTHER SYMPTOMS NOTED  REVIEWED MED  LIST  ON FUROSEMIDE  DIRECTIONS STATES  MAY  TAKE 1-2 TABS  EVERY DAY   AND MAY TAKE 2 TABS  IF WEIGHT GREATER THAN  186 ,PT AWARE. PER PT WEIGHT WAS 188 WILL TAKE  SECOND AB OF FUROSEMIDE  PT TO CONTINUE TO MONITOR WEIGHT WILL FORWARD TO DR HOCHREIN./CY

## 2012-03-23 NOTE — Telephone Encounter (Signed)
We can call him tomorrow and see if he is feeling any better.

## 2012-03-24 NOTE — Telephone Encounter (Signed)
Left message for pt to call back to let me know how he is feeling

## 2012-03-24 NOTE — Telephone Encounter (Signed)
Spoke with pt at length about how he is feeling.  Reports for several weeks he has had a productive cough and felt "plugged up"  He has been having to stop and rest because it is hard to breath.  He has been taking extra lasix from time to time and wants to know if it is OK to continue this.  In further speaking with the pt I discovered the pt was only taking lasix 40 mg once a day and not 40 BID as Dr Antoine Poche understood at pts last appt.  He was instructed to take Furosemide 40 mg one BID and was happy with this.  He will continue to weigh himself daily and call with any problems.  He will keep his follow up appointment with Sunday Spillers as scheduled.

## 2012-04-03 ENCOUNTER — Encounter: Payer: Self-pay | Admitting: Nurse Practitioner

## 2012-04-03 ENCOUNTER — Ambulatory Visit (INDEPENDENT_AMBULATORY_CARE_PROVIDER_SITE_OTHER): Payer: Medicare Other | Admitting: Nurse Practitioner

## 2012-04-03 VITALS — BP 130/68 | HR 64 | Ht 71.5 in | Wt 191.4 lb

## 2012-04-03 DIAGNOSIS — I5022 Chronic systolic (congestive) heart failure: Secondary | ICD-10-CM

## 2012-04-03 DIAGNOSIS — Z79899 Other long term (current) drug therapy: Secondary | ICD-10-CM

## 2012-04-03 LAB — HEPATIC FUNCTION PANEL
ALT: 12 U/L (ref 0–53)
AST: 14 U/L (ref 0–37)
Albumin: 3.5 g/dL (ref 3.5–5.2)
Alkaline Phosphatase: 59 U/L (ref 39–117)
Bilirubin, Direct: 0 mg/dL (ref 0.0–0.3)
Total Bilirubin: 0.7 mg/dL (ref 0.3–1.2)
Total Protein: 7.2 g/dL (ref 6.0–8.3)

## 2012-04-03 LAB — BASIC METABOLIC PANEL
BUN: 17 mg/dL (ref 6–23)
CO2: 30 mEq/L (ref 19–32)
Calcium: 8.5 mg/dL (ref 8.4–10.5)
Chloride: 100 mEq/L (ref 96–112)
Creatinine, Ser: 1.3 mg/dL (ref 0.4–1.5)
GFR: 58.03 mL/min — ABNORMAL LOW (ref >60.00)
Glucose, Bld: 102 mg/dL — ABNORMAL HIGH (ref 70–99)
Potassium: 3.8 mEq/L (ref 3.5–5.1)
Sodium: 137 mEq/L (ref 135–145)

## 2012-04-03 NOTE — Patient Instructions (Addendum)
Stay on your current medicines  We need to check labs today (BMET/HPF)  I want to see you in a month  Call the [facility] office at [phone] if you have any questions, problems or concerns.

## 2012-04-03 NOTE — Progress Notes (Signed)
Andrew [name] Date of Birth: [birth year] Medical Record #[id number]  History of Present Illness: Andrew [name] is seen back today for a 2 week check. He is seen for Dr. Antoine Poche. I have not seen him in a quite a while. He has chronic systolic heart failure. Now with BIV device in place. On Xarelto for his atrial flutter and off of amiodarone due to increased LFTs in the remote past. Has COPD. Uses oxygen at night. Has known CAD with remote MIs and multiple stents. EF is 25%. Other issues include HTN, HLD, CRI.   He comes in today. He is here alone. He thinks he is doing ok. He is taking 2 fluid pills a day. Weight has been stable. He will take a 3rd pill if his weight goes up 2 pounds overnight. Has some chronic dyspnea on exertion but seems to be stable. No chest pain. Tolerating his medicines. Taking hydralazine 3 x a day. BP has been ok.   Current Outpatient Prescriptions on File Prior to Visit  Medication Sig Dispense Refill  . ADVAIR DISKUS 250-50 MCG/DOSE AEPB USE ONE INHALATION TWO TIMES A DAY  3 each  0  . albuterol (2.5 MG/3ML) 0.083% NEBU 3 mL, albuterol (5 MG/ML) 0.5% NEBU 0.5 mL Inhale 2.5 mg into the lungs 3 (three) times daily as needed. For shortness of breath.      . Albuterol Sulfate (PROAIR HFA IN) Inhale 2 puffs into the lungs every 8 (eight) hours as needed. Shortness of breath.      . ALPRAZolam (XANAX) 0.25 MG tablet Take 0.25 mg by mouth at bedtime as needed. For anxiety.      . carvedilol (COREG) 25 MG tablet TAKE 1 TABLET TWICE A DAY WITH MEALS  120 tablet  0  . fluticasone (FLONASE) 50 MCG/ACT nasal spray Place 2 sprays into the nose daily.  1 g  6  . furosemide (LASIX) 40 MG tablet TAKE 1 TO 2 TABLETS DAILY, 2 TABLETS IF WEIGHT IS GREATER THAN 186  135 tablet  0  . hydrALAZINE (APRESOLINE) 25 MG tablet Take 2 tablets (50 mg total) by mouth 3 (three) times daily.      [name] HYDROcodone-acetaminophen (VICODIN) 5-500 MG per tablet Take 1 tablet by mouth every 12 (twelve) hours as  needed. For pain.      [name] losartan (COZAAR) 50 MG tablet TAKE 1 TABLET TWICE A DAY  180 tablet  3  . NEXIUM 40 MG capsule TAKE 1 CAPSULE TWICE A DAY  180 each  1  . nitroGLYCERIN (NITROSTAT) 0.4 MG SL tablet Place 1 tablet (0.4 mg total) under the tongue every 5 (five) minutes as needed for chest pain.  25 tablet  6  . potassium chloride (K-DUR) 10 MEQ tablet Take 1 tablet (10 mEq total) by mouth daily.  30 tablet  3  . pravastatin (PRAVACHOL) 20 MG tablet TAKE 1 TABLET EVERY EVENING  30 tablet  10  . Prenatal Vit-Fe Fumarate-FA (PRENATAL MULTIVITAMIN) TABS Take 1 tablet by mouth every evening.  90 tablet  2  . Rivaroxaban (XARELTO) 20 MG TABS Take 20 mg by mouth daily.  90 tablet  3  . Tamsulosin HCl (FLOMAX) 0.4 MG CAPS Take 0.4 mg by mouth 2 (two) times daily.       . [DISCONTINUED] metoprolol tartrate (LOPRESSOR) 25 MG tablet Take 1/2 tablet every other day   45 tablet  3    Allergies  Allergen Reactions  . Codeine Nausea And Vomiting  . Inspra (  Eplerenone)     Stomach problems  . Spironolactone Other (See Comments)    Gynecomastia  . Doxycycline Itching and Rash  . Sulfonamide Derivatives Itching and Rash    Past Medical History  Diagnosis Date  . Chronic systolic heart failure     chronic LV systolic failure; last echo in December of 2011 showed EF of 25%; echo 02/2011: Mild LVH, EF 15%, posterior lateral akinesis, inferior akinesis, grade 1 diastolic dysfunction, mild LAE   . Ischemic heart disease   . Atrial flutter     off amiodarone because of increased LFTs  . Shortness of breath     chronic sob with known COPD and probable chronic bronchitis  . COPD (chronic obstructive pulmonary disease)   . Bronchitis, chronic   . Myocardial infarction 1994  . Gastro - esophageal reflux   . Claudication   . BPH (benign prostatic hyperplasia)   . Coronary artery disease     status post myocardial infarction in 1984,  99 and 07.  He has multiple RCA stents;  LHC 04/2011:oLM 25%, mLAD  30%, pCFX 25%, prox to mid RCA stent ok, dRCA 30% and 70%, pPDA 25%, mPL 25-30%, EF 25%  . Hypertension   . Atrial fibrillation   . Hemorrhoids   . Hyperlipidemia   . Hx of adenomatous colonic polyps   . Hx of colonoscopy   . Chronic renal insufficiency   . Biventricular cardiac pacemaker in situ 3.2013    Past Surgical History  Procedure Date  . Cardiac catheterization 2008  . Cataract extraction   . Salivary gland surgery   . Coronary angioplasty with stent placement   . Insert / replace / remove pacemaker 06/16/2011    History  Smoking status  . Former Smoker -- 1.5 packs/day for 55 years  . Quit date: 01/03/2009  Smokeless tobacco  . Never Used    History  Alcohol Use  . Yes    Comment: occasional    Family History  Problem Relation Age of Onset  . Coronary artery disease Father   . Heart disease Mother   . Colon cancer Neg Hx   . Heart disease Brother     Review of Systems: The review of systems is per the HPI.  All other systems were reviewed and are negative.  Physical Exam: BP 130/68  Pulse 64  Ht 5' 11.5" (1.816 m)  Wt 191 lb 6.4 oz (86.818 kg)  BMI 26.32 kg/m2 Weight is down 2 pounds over the last 2 weeks. Patient is very pleasant and in no acute distress. Looks chronically ill. Skin is warm and dry. Color is normal.  HEENT is unremarkable. Normocephalic/atraumatic. PERRL. Sclera are nonicteric. Neck is supple. No masses. No JVD. Lungs are clear. Cardiac exam shows a regular rate and rhythm. Abdomen is soft. Extremities are without edema. Gait and ROM are intact. No gross neurologic deficits noted.   LABORATORY DATA: Pending for today.   Lab Results  Component Value Date   WBC 8.6 03/10/2012   HGB 10.9* 03/10/2012   HCT 32.0* 03/10/2012   PLT 261 03/10/2012   GLUCOSE 110* 03/17/2012   CHOL 136 03/17/2012   TRIG 113.0 03/17/2012   HDL 30.70* 03/17/2012   LDLDIRECT 155.7 10/14/2010   LDLCALC 83 03/17/2012   ALT 150* 04/07/2011   AST 149* 04/07/2011     NA 132* 03/17/2012   K 3.4* 03/17/2012   CL 96 03/17/2012   CREATININE 1.4 03/17/2012   BUN 19 03/17/2012  CO2 28 03/17/2012   TSH 4.286 08/09/2011   INR 1.68* 03/10/2012   HGBA1C 6.0* 03/10/2011     Assessment / Plan:  1. Chronic systolic heart failure - seems to be holding his own. I will see him in a month. Check labs today. I think he will always have some component of dyspnea. On oxygen and may need to use more.   2. CAD - no chest pain.  3. PAF/atrial flutter - on Xarelto  4. Increased LFT's - recheck today.  5. Underlying BIV pacer - followed by Dr. Graciela Husbands.  Overall I think he is holding his own. No change in his medicines today. I will see him back in a month.  Patient is agreeable to this plan and will call if any problems develop in the interim.

## 2012-04-20 ENCOUNTER — Telehealth: Payer: Self-pay | Admitting: Cardiology

## 2012-04-20 NOTE — Telephone Encounter (Signed)
Per pt - BP has been running low for the past several days.  Has been low in the morning since starting the Hydralazine and is getting worse.  Today his BP was 88/45 - has been running 100/40-50.  He drank Gatorade which didn't really help much however his BP this afternoon increased to 160/80.  He is having blurred vision and dizziness when his BP is low.  It speaking with the pt - he takes his Cozaar 50 mg at bedtime every night.  Requested pt change to take this in the AM to see if this helps his 1)BP be higher in the AM and 2)control his elevated BP better in the pm.  Pt agreed and will see if there is any improvement.  Will forward to MD for his knowledge.

## 2012-04-20 NOTE — Telephone Encounter (Signed)
Pt calling re low blood pressure

## 2012-04-21 ENCOUNTER — Telehealth: Payer: Self-pay | Admitting: Cardiology

## 2012-04-21 NOTE — Telephone Encounter (Signed)
Only wants to talk to [facility].

## 2012-04-21 NOTE — Telephone Encounter (Signed)
Pt talked with Pam yesterday and has another question re BP

## 2012-04-24 NOTE — Telephone Encounter (Signed)
Called to follow up with pt - BP this am was low (below 100/?) he re-checked it later and it was 116/60 and he felt tired. This pm it was 103/47 and he felt fine.  He states he feels as if changing the time for his Cozaar has been helpful   He will continue to monitor and follow up as scheduled.  He will call back if changes or problems

## 2012-05-01 ENCOUNTER — Encounter: Payer: Self-pay | Admitting: Nurse Practitioner

## 2012-05-01 ENCOUNTER — Ambulatory Visit (INDEPENDENT_AMBULATORY_CARE_PROVIDER_SITE_OTHER): Payer: Medicare Other | Admitting: Nurse Practitioner

## 2012-05-01 VITALS — BP 108/56 | HR 64 | Ht 72.0 in | Wt 193.0 lb

## 2012-05-01 DIAGNOSIS — I2589 Other forms of chronic ischemic heart disease: Secondary | ICD-10-CM

## 2012-05-01 DIAGNOSIS — I255 Ischemic cardiomyopathy: Secondary | ICD-10-CM

## 2012-05-01 LAB — BASIC METABOLIC PANEL
BUN: 24 mg/dL — ABNORMAL HIGH (ref 6–23)
CO2: 26 mEq/L (ref 19–32)
Calcium: 8.6 mg/dL (ref 8.4–10.5)
Chloride: 99 mEq/L (ref 96–112)
Creatinine, Ser: 1.4 mg/dL (ref 0.4–1.5)
GFR: 50.19 mL/min — ABNORMAL LOW (ref >60.00)
Glucose, Bld: 127 mg/dL — ABNORMAL HIGH (ref 70–99)
Potassium: 4.2 mEq/L (ref 3.5–5.1)
Sodium: 133 mEq/L — ABNORMAL LOW (ref 135–145)

## 2012-05-01 LAB — CBC WITH DIFFERENTIAL/PLATELET
Basophils Absolute: 0 10*3/uL (ref 0.0–0.1)
Basophils Relative: 0.2 % (ref 0.0–3.0)
Eosinophils Absolute: 0.2 10*3/uL (ref 0.0–0.7)
Eosinophils Relative: 1.6 % (ref 0.0–5.0)
HCT: 31.7 % — ABNORMAL LOW (ref 39.0–52.0)
Hemoglobin: 10.6 g/dL — ABNORMAL LOW (ref 13.0–17.0)
Lymphocytes Relative: 6.2 % — ABNORMAL LOW (ref 12.0–46.0)
Lymphs Abs: 0.6 10*3/uL — ABNORMAL LOW (ref 0.7–4.0)
MCHC: 33.4 g/dL (ref 30.0–36.0)
MCV: 91 fl (ref 78.0–100.0)
Monocytes Absolute: 0.8 10*3/uL (ref 0.1–1.0)
Monocytes Relative: 7.8 % (ref 3.0–12.0)
Neutro Abs: 8.7 10*3/uL — ABNORMAL HIGH (ref 1.4–7.7)
Neutrophils Relative %: 84.2 % — ABNORMAL HIGH (ref 43.0–77.0)
Platelets: 271 10*3/uL (ref 150.0–400.0)
RBC: 3.48 Mil/uL — ABNORMAL LOW (ref 4.22–5.81)
RDW: 14.9 % — ABNORMAL HIGH (ref 11.5–14.6)
WBC: 10.3 10*3/uL (ref 4.5–10.5)

## 2012-05-01 NOTE — Patient Instructions (Signed)
For now, stay on your current medicines however, if your BP stays low at home you can cut the Hydralazine back to just 25 mg three times a day  We will check labs today  I will see you back in a month - bring both of your BP cuffs when you come back for [place] to check  Continue to weigh daily and avoid salt  Call the [facility] office at [phone] if you have any questions, problems or concerns.

## 2012-05-01 NOTE — Progress Notes (Signed)
Andrew [name] Date of Birth: [birth year] Medical Record #[id number]  History of Present Illness: Andrew [name] is seen back today for a work in visit. He is seen for Dr. Antoine Poche. He has chronic systolic heart failure. Now with BiVdevice in place. On Xarelto for his atrial flutter and off of amiodarone due to increased LFT's in the remote past. Has COPD. On oxygen at night. Has known CAD with remote MIs and multiple stents. EF is 15% per echo back in 2012. Other issues include HTN, HLD, CRI.   I saw him at the end of December. He seemed to be holding his own.   He called last week. BP was running low for several days. He was symptomatic with blurred vision and dizziness. The timing of his Cozaar was changed - morning and mid afternoon.  He called back after a few days of doing this and reported improvement in his symptoms.   He comes in today. He is here alone. He is doing ok for the most part. He does continue to have spells where his BP is low and he feels bad and has blurred vision. It will last a day or so and then dissipate. No chest pain. Weight at home has been fairly stable. Has been drinking Gatorade when his BP is low and he has been cautioned about that. He is using a wrist cuff at home. Has not been checked for accuracy. Breathing has been ok for the most part. He is back to taking just one Lasix a day. Has a chronic cough with clear/white sputum that is unchanged. He does use his oxygen at night and has started using it some during the day as well. His oxygen levels have been good at home.   Current Outpatient Prescriptions on File Prior to Visit  Medication Sig Dispense Refill  . ADVAIR DISKUS 250-50 MCG/DOSE AEPB USE ONE INHALATION TWO TIMES A DAY  3 each  0  . albuterol (2.5 MG/3ML) 0.083% NEBU 3 mL, albuterol (5 MG/ML) 0.5% NEBU 0.5 mL Inhale 2.5 mg into the lungs 3 (three) times daily as needed. For shortness of breath.      . Albuterol Sulfate (PROAIR HFA IN) Inhale 2 puffs into the lungs  every 8 (eight) hours as needed. Shortness of breath.      . ALPRAZolam (XANAX) 0.25 MG tablet Take 0.25 mg by mouth at bedtime as needed. For anxiety.      . carvedilol (COREG) 25 MG tablet TAKE 1 TABLET TWICE A DAY WITH MEALS  120 tablet  0  . fluticasone (FLONASE) 50 MCG/ACT nasal spray Place 2 sprays into the nose daily.  1 g  6  . furosemide (LASIX) 40 MG tablet TAKE 1 TO 2 TABLETS DAILY, 2 TABLETS IF WEIGHT IS GREATER THAN 186  135 tablet  0  . hydrALAZINE (APRESOLINE) 25 MG tablet Take 2 tablets (50 mg total) by mouth 3 (three) times daily.      [name] HYDROcodone-acetaminophen (VICODIN) 5-500 MG per tablet Take 1 tablet by mouth every 6 (six) hours as needed. For pain.      [name] losartan (COZAAR) 50 MG tablet TAKE 1 TABLET TWICE A DAY  180 tablet  3  . NEXIUM 40 MG capsule TAKE 1 CAPSULE TWICE A DAY  180 each  1  . nitroGLYCERIN (NITROSTAT) 0.4 MG SL tablet Place 1 tablet (0.4 mg total) under the tongue every 5 (five) minutes as needed for chest pain.  25 tablet  6  . potassium  chloride (K-DUR) 10 MEQ tablet Take 10 mEq by mouth as needed. Only if pt takes lasix's for weight gain      . Prenatal Vit-Fe Fumarate-FA (PRENATAL MULTIVITAMIN) TABS Take 1 tablet by mouth every evening.  90 tablet  2  . Rivaroxaban (XARELTO) 20 MG TABS Take 20 mg by mouth daily.  90 tablet  3  . Tamsulosin HCl (FLOMAX) 0.4 MG CAPS Take 0.4 mg by mouth 2 (two) times daily.       . [DISCONTINUED] metoprolol tartrate (LOPRESSOR) 25 MG tablet Take 1/2 tablet every other day   45 tablet  3    Allergies  Allergen Reactions  . Codeine Nausea And Vomiting  . Inspra (Eplerenone)     Stomach problems  . Spironolactone Other (See Comments)    Gynecomastia  . Doxycycline Itching and Rash  . Sulfonamide Derivatives Itching and Rash    Past Medical History  Diagnosis Date  . Chronic systolic heart failure     chronic LV systolic failure; last echo in December of 2011 showed EF of 25%; echo 02/2011: Mild LVH, EF 15%,  posterior lateral akinesis, inferior akinesis, grade 1 diastolic dysfunction, mild LAE   . Ischemic heart disease   . Atrial flutter     off amiodarone because of increased LFTs  . Shortness of breath     chronic sob with known COPD and probable chronic bronchitis  . COPD (chronic obstructive pulmonary disease)   . Bronchitis, chronic   . Myocardial infarction 1994  . Gastro - esophageal reflux   . Claudication   . BPH (benign prostatic hyperplasia)   . Coronary artery disease     status post myocardial infarction in 1984,  99 and 07.  He has multiple RCA stents;  LHC 04/2011:oLM 25%, mLAD 30%, pCFX 25%, prox to mid RCA stent ok, dRCA 30% and 70%, pPDA 25%, mPL 25-30%, EF 25%  . Hypertension   . Atrial fibrillation   . Hemorrhoids   . Hyperlipidemia   . Hx of adenomatous colonic polyps   . Hx of colonoscopy   . Chronic renal insufficiency   . Biventricular cardiac pacemaker in situ 3.2013    Past Surgical History  Procedure Date  . Cardiac catheterization 2008  . Cataract extraction   . Salivary gland surgery   . Coronary angioplasty with stent placement   . Insert / replace / remove pacemaker 06/16/2011    History  Smoking status  . Former Smoker -- 1.5 packs/day for 55 years  . Quit date: 01/03/2009  Smokeless tobacco  . Never Used    History  Alcohol Use  . Yes    Comment: occasional    Family History  Problem Relation Age of Onset  . Coronary artery disease Father   . Heart disease Mother   . Colon cancer Neg Hx   . Heart disease Brother     Review of Systems: The review of systems is per the HPI.  All other systems were reviewed and are negative.  Physical Exam: BP 108/56  Pulse 64  Ht 6' (1.829 m)  Wt 193 lb (87.544 kg)  BMI 26.18 kg/m2 Patient is very pleasant and in no acute distress. Skin is warm and dry. Color is normal.  HEENT is unremarkable. Normocephalic/atraumatic. PERRL. Sclera are nonicteric. Neck is supple. No masses. No JVD. Lungs are  clear. Cardiac exam shows a regular rate and rhythm. Abdomen is soft. Extremities are without edema. Gait and ROM are intact. No gross neurologic  deficits noted.   LABORATORY DATA:  Lab Results  Component Value Date   WBC 8.6 03/10/2012   HGB 10.9* 03/10/2012   HCT 32.0* 03/10/2012   PLT 261 03/10/2012   GLUCOSE 102* 04/03/2012   CHOL 136 03/17/2012   TRIG 113.0 03/17/2012   HDL 30.70* 03/17/2012   LDLDIRECT 155.7 10/14/2010   LDLCALC 83 03/17/2012   ALT 12 04/03/2012   AST 14 04/03/2012   NA 137 04/03/2012   K 3.8 04/03/2012   CL 100 04/03/2012   CREATININE 1.3 04/03/2012   BUN 17 04/03/2012   CO2 30 04/03/2012   TSH 4.286 08/09/2011   INR 1.68* 03/10/2012   HGBA1C 6.0* 03/10/2011    Assessment / Plan:  1. Ischemic CM - EF of 15% - he looks fairly compensated. I have had a nice discussion with him explaining that the management of his heart failure is a delicate balance - one side is the increased weight/dyspnea and the other is weak/dizzy when the weight is down. I would like to leave him on his current CHF regimen. He may cut the Hydralazine back to 25 mg TID if his low symptomatic BP's continue. He will monitor at home and adjust this dose as needed. He will also bring his cuffs in to check for correlation.   2. CAD - multiple PCI's with remote CABG as well - no chest pain reported.   3. Hypotension - due to his current CHF regimen. Will try to continue for now. He will continue to monitor. Will cut the Hydralazine back if needed.   4. PAF/flutter - on Xarelto.   5. Functioning BiV pacer - followed by Dr. Graciela Husbands  We will check follow up labs today. I will see him in a month.   Patient is agreeable to this plan and will call if any problems develop in the interim.

## 2012-05-03 ENCOUNTER — Encounter (HOSPITAL_COMMUNITY): Payer: Self-pay | Admitting: Emergency Medicine

## 2012-05-03 ENCOUNTER — Inpatient Hospital Stay (HOSPITAL_COMMUNITY)
Admission: EM | Admit: 2012-05-03 | Discharge: 2012-05-10 | DRG: 871 | Disposition: A | Discharge status: Home / Self Care | Payer: Medicare Other | Attending: Family Medicine | Admitting: Family Medicine

## 2012-05-03 ENCOUNTER — Emergency Department (HOSPITAL_COMMUNITY): Payer: Medicare Other

## 2012-05-03 ENCOUNTER — Other Ambulatory Visit: Payer: Self-pay

## 2012-05-03 DIAGNOSIS — A419 Sepsis, unspecified organism: Secondary | ICD-10-CM | POA: Diagnosis present

## 2012-05-03 DIAGNOSIS — Z87891 Personal history of nicotine dependence: Secondary | ICD-10-CM

## 2012-05-03 DIAGNOSIS — F411 Generalized anxiety disorder: Secondary | ICD-10-CM | POA: Diagnosis present

## 2012-05-03 DIAGNOSIS — J189 Pneumonia, unspecified organism: Secondary | ICD-10-CM | POA: Diagnosis present

## 2012-05-03 DIAGNOSIS — Z950 Presence of cardiac pacemaker: Secondary | ICD-10-CM | POA: Diagnosis present

## 2012-05-03 DIAGNOSIS — I4891 Unspecified atrial fibrillation: Secondary | ICD-10-CM | POA: Diagnosis present

## 2012-05-03 DIAGNOSIS — J4489 Other specified chronic obstructive pulmonary disease: Secondary | ICD-10-CM | POA: Diagnosis present

## 2012-05-03 DIAGNOSIS — I10 Essential (primary) hypertension: Secondary | ICD-10-CM | POA: Diagnosis present

## 2012-05-03 DIAGNOSIS — N400 Enlarged prostate without lower urinary tract symptoms: Secondary | ICD-10-CM | POA: Diagnosis present

## 2012-05-03 DIAGNOSIS — N179 Acute kidney failure, unspecified: Secondary | ICD-10-CM | POA: Diagnosis present

## 2012-05-03 DIAGNOSIS — I2510 Atherosclerotic heart disease of native coronary artery without angina pectoris: Secondary | ICD-10-CM | POA: Diagnosis present

## 2012-05-03 DIAGNOSIS — I255 Ischemic cardiomyopathy: Secondary | ICD-10-CM

## 2012-05-03 DIAGNOSIS — I129 Hypertensive chronic kidney disease with stage 1 through stage 4 chronic kidney disease, or unspecified chronic kidney disease: Secondary | ICD-10-CM | POA: Diagnosis present

## 2012-05-03 DIAGNOSIS — A409 Streptococcal sepsis, unspecified: Principal | ICD-10-CM | POA: Diagnosis present

## 2012-05-03 DIAGNOSIS — Z9861 Coronary angioplasty status: Secondary | ICD-10-CM

## 2012-05-03 DIAGNOSIS — K219 Gastro-esophageal reflux disease without esophagitis: Secondary | ICD-10-CM | POA: Diagnosis present

## 2012-05-03 DIAGNOSIS — N189 Chronic kidney disease, unspecified: Secondary | ICD-10-CM | POA: Diagnosis present

## 2012-05-03 DIAGNOSIS — D638 Anemia in other chronic diseases classified elsewhere: Secondary | ICD-10-CM | POA: Diagnosis present

## 2012-05-03 DIAGNOSIS — I472 Ventricular tachycardia, unspecified: Secondary | ICD-10-CM | POA: Diagnosis present

## 2012-05-03 DIAGNOSIS — I5022 Chronic systolic (congestive) heart failure: Secondary | ICD-10-CM | POA: Diagnosis present

## 2012-05-03 DIAGNOSIS — J449 Chronic obstructive pulmonary disease, unspecified: Secondary | ICD-10-CM | POA: Diagnosis present

## 2012-05-03 DIAGNOSIS — I5023 Acute on chronic systolic (congestive) heart failure: Secondary | ICD-10-CM | POA: Diagnosis present

## 2012-05-03 DIAGNOSIS — Z951 Presence of aortocoronary bypass graft: Secondary | ICD-10-CM

## 2012-05-03 DIAGNOSIS — I509 Heart failure, unspecified: Secondary | ICD-10-CM | POA: Diagnosis present

## 2012-05-03 DIAGNOSIS — J441 Chronic obstructive pulmonary disease with (acute) exacerbation: Secondary | ICD-10-CM | POA: Diagnosis present

## 2012-05-03 DIAGNOSIS — Z9981 Dependence on supplemental oxygen: Secondary | ICD-10-CM

## 2012-05-03 DIAGNOSIS — J9610 Chronic respiratory failure, unspecified whether with hypoxia or hypercapnia: Secondary | ICD-10-CM | POA: Diagnosis present

## 2012-05-03 DIAGNOSIS — I252 Old myocardial infarction: Secondary | ICD-10-CM

## 2012-05-03 DIAGNOSIS — I4729 Other ventricular tachycardia: Secondary | ICD-10-CM | POA: Diagnosis present

## 2012-05-03 DIAGNOSIS — D649 Anemia, unspecified: Secondary | ICD-10-CM | POA: Diagnosis present

## 2012-05-03 DIAGNOSIS — E785 Hyperlipidemia, unspecified: Secondary | ICD-10-CM | POA: Diagnosis present

## 2012-05-03 DIAGNOSIS — I2589 Other forms of chronic ischemic heart disease: Secondary | ICD-10-CM | POA: Diagnosis present

## 2012-05-03 HISTORY — DX: Ventricular tachycardia: I47.2

## 2012-05-03 HISTORY — DX: Unspecified osteoarthritis, unspecified site: M19.90

## 2012-05-03 HISTORY — DX: Other ventricular tachycardia: I47.29

## 2012-05-03 LAB — BASIC METABOLIC PANEL
BUN: 27 mg/dL — ABNORMAL HIGH (ref 6–23)
CO2: 23 mEq/L (ref 19–32)
Calcium: 8.6 mg/dL (ref 8.4–10.5)
Chloride: 103 mEq/L (ref 96–112)
Creatinine, Ser: 1.61 mg/dL — ABNORMAL HIGH (ref 0.50–1.35)
GFR calc Af Amer: 45 mL/min — ABNORMAL LOW (ref >90)
GFR calc non Af Amer: 39 mL/min — ABNORMAL LOW (ref >90)
Glucose, Bld: 132 mg/dL — ABNORMAL HIGH (ref 70–99)
Potassium: 4 mEq/L (ref 3.5–5.1)
Sodium: 137 mEq/L (ref 135–145)

## 2012-05-03 LAB — PROTIME-INR
INR: 1.77 — ABNORMAL HIGH (ref 0.00–1.49)
Prothrombin Time: 20 seconds — ABNORMAL HIGH (ref 11.6–15.2)

## 2012-05-03 LAB — CBC
MCH: 30 pg (ref 26.0–34.0)
MCHC: 32.9 g/dL (ref 30.0–36.0)
MCV: 91.1 fL (ref 78.0–100.0)
Platelets: 215 10*3/uL (ref 150–400)

## 2012-05-03 MED ORDER — DEXTROSE 5 % IV SOLN
1.0000 g | Freq: Once | INTRAVENOUS | Status: AC
  Administered 2012-05-04: 1 g via INTRAVENOUS
  Filled 2012-05-03: qty 10

## 2012-05-03 MED ORDER — AZITHROMYCIN 500 MG IV SOLR
500.0000 mg | Freq: Once | INTRAVENOUS | Status: AC
  Administered 2012-05-04: 500 mg via INTRAVENOUS
  Filled 2012-05-03: qty 500

## 2012-05-03 MED ORDER — FUROSEMIDE 10 MG/ML IJ SOLN
40.0000 mg | Freq: Once | INTRAMUSCULAR | Status: AC
  Administered 2012-05-03: 40 mg via INTRAVENOUS
  Filled 2012-05-03: qty 4

## 2012-05-03 NOTE — ED Notes (Signed)
Pt to ED from home with c/o respiratory distress onset yesterday and SOB worse today with activities. Also reports increased weakness. Per O2 92% at arrival and 98% on 2L [place]. Vitals per EMS BP 147/60, HR 87, RR 18, Temp 101.8. EMS given 5mg  albuterol.

## 2012-05-03 NOTE — ED Notes (Signed)
Per md approval.  Pt provided ice chips

## 2012-05-03 NOTE — ED Provider Notes (Signed)
History     CSN: [id number]  Arrival date & time 05/03/12  2007   First MD Initiated Contact with Patient 05/03/12 2202      Chief Complaint  Patient presents with  . Respiratory Distress    (Consider location/radiation/quality/duration/timing/severity/associated sxs/prior treatment) HPI Comments: 79 y/o male h/o CHF (15%), MI x3, A. Fib, Biv. ICD, COPD, CKD p/w SOB. Onset yesterday. Worse with exertion. No orthopnea, but with lower extremity edema. No chest pain. Recent change in BP meds. Decreased hydralazine per cards about 4 days ago. Has chronic cough without change. No fevers. Reports generalized fatigue/weakness.  Patient is a 79 y.o. male presenting with shortness of breath. The history is provided by the patient.  Shortness of Breath  The current episode started yesterday. The onset was gradual. The problem occurs continuously. The problem has been gradually worsening. The problem is moderate. The symptoms are relieved by rest. The symptoms are aggravated by activity. Associated symptoms include cough (chronic) and shortness of breath. Pertinent negatives include no chest pain, no chest pressure, no orthopnea, no fever and no rhinorrhea.    Past Medical History  Diagnosis Date  . Chronic systolic heart failure     chronic LV systolic failure; last echo in December of 2011 showed EF of 25%; echo 02/2011: Mild LVH, EF 15%, posterior lateral akinesis, inferior akinesis, grade 1 diastolic dysfunction, mild LAE   . Ischemic heart disease   . Atrial flutter     off amiodarone because of increased LFTs  . Shortness of breath     chronic sob with known COPD and probable chronic bronchitis  . COPD (chronic obstructive pulmonary disease)   . Bronchitis, chronic   . Myocardial infarction 1994  . Gastro - esophageal reflux   . Claudication   . BPH (benign prostatic hyperplasia)   . Coronary artery disease     status post myocardial infarction in 1984,  99 and 07.  He has multiple  RCA stents;  LHC 04/2011:oLM 25%, mLAD 30%, pCFX 25%, prox to mid RCA stent ok, dRCA 30% and 70%, pPDA 25%, mPL 25-30%, EF 25%  . Hypertension   . Atrial fibrillation   . Hemorrhoids   . Hyperlipidemia   . Hx of adenomatous colonic polyps   . Hx of colonoscopy   . Chronic renal insufficiency   . Biventricular cardiac pacemaker in situ 3.2013    Past Surgical History  Procedure Date  . Cardiac catheterization 2008  . Cataract extraction   . Salivary gland surgery   . Coronary angioplasty with stent placement   . Insert / replace / remove pacemaker 06/16/2011    Family History  Problem Relation Age of Onset  . Coronary artery disease Father   . Heart disease Mother   . Colon cancer Neg Hx   . Heart disease Brother     History  Substance Use Topics  . Smoking status: Former Smoker -- 1.5 packs/day for 55 years    Quit date: 01/03/2009  . Smokeless tobacco: Never Used  . Alcohol Use: Yes     Comment: occasional      Review of Systems  Constitutional: Positive for fatigue. Negative for fever and chills.  HENT: Negative for congestion and rhinorrhea.   Eyes: Negative for pain and visual disturbance.  Respiratory: Positive for cough (chronic) and shortness of breath.   Cardiovascular: Negative for chest pain, orthopnea and leg swelling.  Gastrointestinal: Negative for nausea, vomiting, abdominal pain and diarrhea.  Genitourinary: Negative for flank pain  and difficulty urinating.  Musculoskeletal: Negative for back pain.  Skin: Negative for color change and rash.  Neurological: Positive for weakness (generalized. not focal). Negative for dizziness and headaches.  All other systems reviewed and are negative.    Allergies  Codeine; Inspra; Spironolactone; Doxycycline; and Sulfonamide derivatives  Home Medications   Current Outpatient Rx  Name  Route  Sig  Dispense  Refill  . ALBUTEROL SULFATE (5 MG/3.5 ML) NEBULIZER SOLUTION   Inhalation   Inhale 2.5 mg into the  lungs 3 (three) times daily as needed. For shortness of breath.         [name] PROAIR HFA IN   Inhalation   Inhale 2 puffs into the lungs every 8 (eight) hours as needed. Shortness of breath.         . ALPRAZOLAM 0.25 MG PO TABS   Oral   Take 0.25 mg by mouth daily as needed. For anxiety.         [name] CARVEDILOL 25 MG PO TABS   Oral   Take 25 mg by mouth 2 (two) times daily.          [name] ESOMEPRAZOLE MAGNESIUM 40 MG PO CPDR   Oral   Take 40 mg by mouth 2 (two) times daily.         [name] FLUTICASONE PROPIONATE 50 MCG/ACT NA SUSP   Each Nare   Place 1 spray into both nostrils daily.         . FUROSEMIDE 40 MG PO TABS   Oral   Take 40-80 mg by mouth daily. Takes 1 tablet everyday; then based on weight gain from previous day, if >2 lbs. Take 1 additional tablet as needed.         [name] HYDRALAZINE HCL 25 MG PO TABS   Oral   Take 25 mg by mouth daily.         [name] HYDROCODONE-ACETAMINOPHEN 5-500 MG PO TABS   Oral   Take 1 tablet by mouth every 6 (six) hours as needed. For pain.         [name] LOSARTAN POTASSIUM 50 MG PO TABS   Oral   Take 50 mg by mouth 2 (two) times daily.         [name] POTASSIUM CHLORIDE ER 10 MEQ PO TBCR   Oral   Take 10 mEq by mouth daily as needed. For potassium level regulation, take potassium as needed when taking both lasix tablets.         [name] PRENATAL MULTIVITAMIN CH   Oral   Take 1 tablet by mouth every evening.   90 tablet   2   . RIVAROXABAN 20 MG PO TABS   Oral   Take 20 mg by mouth daily.   90 tablet   3   . TAMSULOSIN HCL 0.4 MG PO CAPS   Oral   Take 0.4 mg by mouth 2 (two) times daily.          [name] NITROGLYCERIN 0.4 MG SL SUBL   Sublingual   Place 1 tablet (0.4 mg total) under the tongue every 5 (five) minutes as needed for chest pain.   25 tablet   6     BP 123/40  Pulse 75  Temp 101.4 F (38.6 C) (Rectal)  Resp 24  SpO2 90%  Physical Exam  Nursing note and vitals reviewed. Constitutional: He is oriented to person, place,  and time. He appears well-developed and well-nourished. No distress.  Obese male, NAD  HENT:  Head: Normocephalic and atraumatic.  Eyes: Conjunctivae normal are normal. Pupils are equal, round, and reactive to light. Right eye exhibits no discharge. Left eye exhibits no discharge.  Neck: No tracheal deviation present.  Cardiovascular: Normal rate, regular rhythm, normal heart sounds and intact distal pulses.   Pulmonary/Chest: Effort normal. No stridor. No respiratory distress. He has wheezes (on forced exhalation only). He has no rales.  Abdominal: Soft. He exhibits no distension. There is no tenderness. There is no guarding.  Musculoskeletal: He exhibits edema. He exhibits no tenderness.  Neurological: He is alert and oriented to person, place, and time.  Skin: Skin is warm and dry.  Psychiatric: He has a normal mood and affect. His behavior is normal.    ED Course  Procedures (including critical care time)  Labs Reviewed  CBC - Abnormal; Notable for the following:    WBC 16.3 (*)     RBC 3.27 (*)     Hemoglobin 9.8 (*)     HCT 29.8 (*)     All other components within normal limits  BASIC METABOLIC PANEL - Abnormal; Notable for the following:    Glucose, Bld 132 (*)     BUN 27 (*)     Creatinine, Ser 1.61 (*)     GFR calc non Af Amer 39 (*)     GFR calc Af Amer 45 (*)     All other components within normal limits  PROTIME-INR - Abnormal; Notable for the following:    Prothrombin Time 20.0 (*)     INR 1.77 (*)     All other components within normal limits  PRO B NATRIURETIC PEPTIDE - Abnormal; Notable for the following:    Pro B Natriuretic peptide (BNP) 10883.0 (*)     All other components within normal limits  URINALYSIS, ROUTINE W REFLEX MICROSCOPIC - Abnormal; Notable for the following:    Protein, ur 100 (*)     All other components within normal limits  URINE MICROSCOPIC-ADD ON - Abnormal; Notable for the following:    Casts HYALINE CASTS (*)     All other  components within normal limits  APTT  TROPONIN I  LACTIC ACID, PLASMA  CULTURE, BLOOD (ROUTINE X 2)  CULTURE, BLOOD (ROUTINE X 2)  TROPONIN I   Dg Chest 2 View  05/03/2012  *RADIOLOGY REPORT*  Clinical Data: Respiratory distress  CHEST - 2 VIEW  Comparison: 03/10/2012  Findings: Cardiomegaly.  Central vascular congestion.  Left chest wall battery pack with unchanged lead tip positions.  Increased interstitial markings and peribronchial thickening. Mild right greater than left apical scarring/pleural thickening and small right upper lobe bulla.  Otherwise, no confluent airspace opacity. No pleural effusion or pneumothorax.  No acute osseous finding.  IMPRESSION: Increased interstitial markings and peribronchial thickening may be chronic or acute on chronic; as can be seen with edema or bronchitis.   Original Report Authenticated By: Jearld Lesch, M.D.      1. Community acquired pneumonia   2. CHF exacerbation      Date: 05/03/2012  Rate: 79  Rhythm: normal sinus rhythm  QRS Axis: indeterminate  Intervals: normal  ST/T Wave abnormalities: nonspecific ST/T changes  Conduction Disutrbances:nonspecific intraventricular conduction delay  Narrative Interpretation:   Old EKG Reviewed: unchanged    Date: 05/04/2012  Rate: 76  Rhythm: normal sinus rhythm  QRS Axis: indeterminate  Intervals: normal  ST/T Wave abnormalities: nonspecific ST/T changes  Conduction Disutrbances:nonspecific intraventricular conduction delay  Narrative Interpretation:   Old EKG Reviewed: unchanged    MDM    79 y/o male p/w SOB. Intermittent past few days. Worse since last night. Increased severity throughout day today. No chest pain. SOB with exertion. Chronic cough. Little nausea. Diffuse fatigue. Febrile. HDS, not hypoxic. Concern for pna. CAP. Treat with rocephin and azithromycin. Lasix x1 for CHF component. Patient to be admitted to family medicine  Just prior to admission patient developed  brief sharp left sided chest pain. No diaphoresis or worsening of SOB. Otherwise asymptomatic. Has not had this pain previously. EKG unchanged. Repeat troponin sent.  Patient admitted.        Stevie Kern, MD 05/04/12 [phone]

## 2012-05-03 NOTE — ED Provider Notes (Addendum)
I saw and evaluated the patient, reviewed the resident's note and I agree with the findings and plan.   Patient seen by me. Patient brought in by EMS. Patient had onset of shortness of breath yesterday with activities. Complain a brief rest for distress was worsening today. Patient's O2 saturation was 92% on arrival and 98% on 2 L nasal cannula. Chest x-ray raises concern for interstitial edema or possible early pneumonia. Patient has not had any recent admissions is not immunocompromise. We will send off a lactic acid. And the treat with CAP Antibiotics. No recent admission or hx of immunocompromised state. We'll also give some Lasix as well due to ?edema on CXR and elevated BNP. Confusing picture could be pulmonary edema as well. The patient does have a leukocytosis of 16,000. Also has a marked elevated BNP. First troponin was negative.   EKG was reviewed and concur with the resident's findings.   Patient is followed by [facility] urgent [facility] will be admitted by family medicine.   Results for orders placed during the hospital encounter of 05/03/12  CBC      Component Value Range   WBC 16.3 (*) 4.0 - 10.5 K/uL   RBC 3.27 (*) 4.22 - 5.81 MIL/uL   Hemoglobin 9.8 (*) 13.0 - 17.0 g/dL   HCT 16.1 (*) 09.6 - 04.5 %   MCV 91.1  78.0 - 100.0 fL   MCH 30.0  26.0 - 34.0 pg   MCHC 32.9  30.0 - 36.0 g/dL   RDW 40.9  81.1 - 91.4 %   Platelets 215  150 - 400 K/uL  BASIC METABOLIC PANEL      Component Value Range   Sodium 137  135 - 145 mEq/L   Potassium 4.0  3.5 - 5.1 mEq/L   Chloride 103  96 - 112 mEq/L   CO2 23  19 - 32 mEq/L   Glucose, Bld 132 (*) 70 - 99 mg/dL   BUN 27 (*) 6 - 23 mg/dL   Creatinine, Ser 7.82 (*) 0.50 - 1.35 mg/dL   Calcium 8.6  8.4 - 95.6 mg/dL   GFR calc non Af Amer 39 (*) >90 mL/min   GFR calc Af Amer 45 (*) >90 mL/min  PROTIME-INR      Component Value Range   Prothrombin Time 20.0 (*) 11.6 - 15.2 seconds   INR 1.77 (*) 0.00 - 1.49  APTT      Component Value Range   aPTT  33  24 - 37 seconds  TROPONIN I      Component Value Range   Troponin I <0.30  <0.30 ng/mL  PRO B NATRIURETIC PEPTIDE      Component Value Range   Pro B Natriuretic peptide (BNP) 10883.0 (*) 0 - 450 pg/mL   Results for orders placed during the hospital encounter of 05/03/12  CBC      Component Value Range   WBC 16.3 (*) 4.0 - 10.5 K/uL   RBC 3.27 (*) 4.22 - 5.81 MIL/uL   Hemoglobin 9.8 (*) 13.0 - 17.0 g/dL   HCT 21.3 (*) 08.6 - 57.8 %   MCV 91.1  78.0 - 100.0 fL   MCH 30.0  26.0 - 34.0 pg   MCHC 32.9  30.0 - 36.0 g/dL   RDW 46.9  62.9 - 52.8 %   Platelets 215  150 - 400 K/uL  BASIC METABOLIC PANEL      Component Value Range   Sodium 137  135 - 145 mEq/L  Potassium 4.0  3.5 - 5.1 mEq/L   Chloride 103  96 - 112 mEq/L   CO2 23  19 - 32 mEq/L   Glucose, Bld 132 (*) 70 - 99 mg/dL   BUN 27 (*) 6 - 23 mg/dL   Creatinine, Ser 8.29 (*) 0.50 - 1.35 mg/dL   Calcium 8.6  8.4 - 56.2 mg/dL   GFR calc non Af Amer 39 (*) >90 mL/min   GFR calc Af Amer 45 (*) >90 mL/min  PROTIME-INR      Component Value Range   Prothrombin Time 20.0 (*) 11.6 - 15.2 seconds   INR 1.77 (*) 0.00 - 1.49  APTT      Component Value Range   aPTT 33  24 - 37 seconds  TROPONIN I      Component Value Range   Troponin I <0.30  <0.30 ng/mL  PRO B NATRIURETIC PEPTIDE      Component Value Range   Pro B Natriuretic peptide (BNP) 10883.0 (*) 0 - 450 pg/mL   Dg Chest 2 View  05/03/2012  *RADIOLOGY REPORT*  Clinical Data: Respiratory distress  CHEST - 2 VIEW  Comparison: 03/10/2012  Findings: Cardiomegaly.  Central vascular congestion.  Left chest wall battery pack with unchanged lead tip positions.  Increased interstitial markings and peribronchial thickening. Mild right greater than left apical scarring/pleural thickening and small right upper lobe bulla.  Otherwise, no confluent airspace opacity. No pleural effusion or pneumothorax.  No acute osseous finding.  IMPRESSION: Increased interstitial markings and  peribronchial thickening may be chronic or acute on chronic; as can be seen with edema or bronchitis.   Original Report Authenticated By: Jearld Lesch, M.D.     Clinically overall patient with elevated white count fever increased shortness of breath mostly concerned about developing of a pneumonic infectious type process. Patient does have an oxygen requirement now. We'll go ahead and do blood cultures are sent off a lactic acid after blood cultures are ordered we'll start the CAP pneumonia protocol antibiotics. And patient will be admitted by family medicine.  Shelda Jakes, MD 05/03/12 2347  Addendum:  Results for orders placed during the hospital encounter of 05/03/12  CBC      Component Value Range   WBC 16.3 (*) 4.0 - 10.5 K/uL   RBC 3.27 (*) 4.22 - 5.81 MIL/uL   Hemoglobin 9.8 (*) 13.0 - 17.0 g/dL   HCT 13.0 (*) 86.5 - 78.4 %   MCV 91.1  78.0 - 100.0 fL   MCH 30.0  26.0 - 34.0 pg   MCHC 32.9  30.0 - 36.0 g/dL   RDW 69.6  29.5 - 28.4 %   Platelets 215  150 - 400 K/uL  BASIC METABOLIC PANEL      Component Value Range   Sodium 137  135 - 145 mEq/L   Potassium 4.0  3.5 - 5.1 mEq/L   Chloride 103  96 - 112 mEq/L   CO2 23  19 - 32 mEq/L   Glucose, Bld 132 (*) 70 - 99 mg/dL   BUN 27 (*) 6 - 23 mg/dL   Creatinine, Ser 1.32 (*) 0.50 - 1.35 mg/dL   Calcium 8.6  8.4 - 44.0 mg/dL   GFR calc non Af Amer 39 (*) >90 mL/min   GFR calc Af Amer 45 (*) >90 mL/min  PROTIME-INR      Component Value Range   Prothrombin Time 20.0 (*) 11.6 - 15.2 seconds   INR 1.77 (*) 0.00 -  1.49  APTT      Component Value Range   aPTT 33  24 - 37 seconds  TROPONIN I      Component Value Range   Troponin I <0.30  <0.30 ng/mL  PRO B NATRIURETIC PEPTIDE      Component Value Range   Pro B Natriuretic peptide (BNP) 10883.0 (*) 0 - 450 pg/mL  LACTIC ACID, PLASMA      Component Value Range   Lactic Acid, Venous 1.3  0.5 - 2.2 mmol/L  URINALYSIS, ROUTINE W REFLEX MICROSCOPIC      Component Value  Range   Color, Urine YELLOW  YELLOW   APPearance CLEAR  CLEAR   Specific Gravity, Urine 1.013  1.005 - 1.030   pH 5.5  5.0 - 8.0   Glucose, UA NEGATIVE  NEGATIVE mg/dL   Hgb urine dipstick NEGATIVE  NEGATIVE   Bilirubin Urine NEGATIVE  NEGATIVE   Ketones, ur NEGATIVE  NEGATIVE mg/dL   Protein, ur 782 (*) NEGATIVE mg/dL   Urobilinogen, UA 0.2  0.0 - 1.0 mg/dL   Nitrite NEGATIVE  NEGATIVE   Leukocytes, UA NEGATIVE  NEGATIVE  URINE MICROSCOPIC-ADD ON      Component Value Range   Squamous Epithelial / LPF RARE  RARE   WBC, UA 0-2  <3 WBC/hpf   RBC / HPF 3-6  <3 RBC/hpf   Bacteria, UA RARE  RARE   Casts HYALINE CASTS (*) NEGATIVE  TROPONIN I      Component Value Range   Troponin I <0.30  <0.30 ng/mL  LEGIONELLA ANTIGEN, URINE      Component Value Range   Specimen Description URINE, RANDOM     Special Requests NONE     Legionella Antigen, Urine Negative for Legionella pneumophilia serogroup 1     Report Status 05/04/2012 FINAL    STREP PNEUMONIAE URINARY ANTIGEN      Component Value Range   Strep Pneumo Urinary Antigen NEGATIVE  NEGATIVE  CBC WITH DIFFERENTIAL      Component Value Range   WBC 13.0 (*) 4.0 - 10.5 K/uL   RBC 3.29 (*) 4.22 - 5.81 MIL/uL   Hemoglobin 9.7 (*) 13.0 - 17.0 g/dL   HCT 95.6 (*) 21.3 - 08.6 %   MCV 92.1  78.0 - 100.0 fL   MCH 29.5  26.0 - 34.0 pg   MCHC 32.0  30.0 - 36.0 g/dL   RDW 57.8  46.9 - 62.9 %   Platelets 194  150 - 400 K/uL   Neutrophils Relative 85 (*) 43 - 77 %   Neutro Abs 11.0 (*) 1.7 - 7.7 K/uL   Lymphocytes Relative 7 (*) 12 - 46 %   Lymphs Abs 0.9  0.7 - 4.0 K/uL   Monocytes Relative 9  3 - 12 %   Monocytes Absolute 1.1 (*) 0.1 - 1.0 K/uL   Eosinophils Relative 0  0 - 5 %   Eosinophils Absolute 0.0  0.0 - 0.7 K/uL   Basophils Relative 0  0 - 1 %   Basophils Absolute 0.0  0.0 - 0.1 K/uL  COMPREHENSIVE METABOLIC PANEL      Component Value Range   Sodium 135  135 - 145 mEq/L   Potassium 3.5  3.5 - 5.1 mEq/L   Chloride 100  96 -  112 mEq/L   CO2 25  19 - 32 mEq/L   Glucose, Bld 130 (*) 70 - 99 mg/dL   BUN 31 (*) 6 - 23 mg/dL  Creatinine, Ser 1.83 (*) 0.50 - 1.35 mg/dL   Calcium 8.4  8.4 - 16.1 mg/dL   Total Protein 6.6  6.0 - 8.3 g/dL   Albumin 3.0 (*) 3.5 - 5.2 g/dL   AST 18  0 - 37 U/L   ALT 9  0 - 53 U/L   Alkaline Phosphatase 60  39 - 117 U/L   Total Bilirubin 0.5  0.3 - 1.2 mg/dL   GFR calc non Af Amer 33 (*) >90 mL/min   GFR calc Af Amer 39 (*) >90 mL/min  TROPONIN I      Component Value Range   Troponin I <0.30  <0.30 ng/mL  TROPONIN I      Component Value Range   Troponin I <0.30  <0.30 ng/mL  INFLUENZA PANEL BY PCR      Component Value Range   Influenza A By PCR NEGATIVE  NEGATIVE   Influenza B By PCR NEGATIVE  NEGATIVE   H1N1 flu by pcr NOT DETECTED  NOT DETECTED    Additional Labs as above:  Infectious etiology clinically still of concern. Cannot rule out pulmonary edema as well.  CRITICAL CARE Performed by: Shelda Jakes.   Total critical care time: 30  Critical care time was exclusive of separately billable procedures and treating other patients.  Critical care was necessary to treat or prevent imminent or life-threatening deterioration.  Critical care was time spent personally by me on the following activities: development of treatment plan with patient and/or surrogate as well as nursing, discussions with consultants, evaluation of patient's response to treatment, examination of patient, obtaining history from patient or surrogate, ordering and performing treatments and interventions, ordering and review of laboratory studies, ordering and review of radiographic studies, pulse oximetry and re-evaluation of patient's condition.   Shelda Jakes, MD 05/04/12 1338  Shelda Jakes, MD 05/04/12 [phone]

## 2012-05-04 ENCOUNTER — Encounter (HOSPITAL_COMMUNITY): Payer: Self-pay | Admitting: Physician Assistant

## 2012-05-04 ENCOUNTER — Other Ambulatory Visit: Payer: Self-pay

## 2012-05-04 DIAGNOSIS — I5023 Acute on chronic systolic (congestive) heart failure: Secondary | ICD-10-CM | POA: Diagnosis present

## 2012-05-04 DIAGNOSIS — I5022 Chronic systolic (congestive) heart failure: Secondary | ICD-10-CM

## 2012-05-04 DIAGNOSIS — J9610 Chronic respiratory failure, unspecified whether with hypoxia or hypercapnia: Secondary | ICD-10-CM

## 2012-05-04 DIAGNOSIS — I517 Cardiomegaly: Secondary | ICD-10-CM

## 2012-05-04 DIAGNOSIS — J189 Pneumonia, unspecified organism: Secondary | ICD-10-CM | POA: Diagnosis present

## 2012-05-04 LAB — URINALYSIS, ROUTINE W REFLEX MICROSCOPIC
Bilirubin Urine: NEGATIVE
Hgb urine dipstick: NEGATIVE
Nitrite: NEGATIVE
Specific Gravity, Urine: 1.013 (ref 1.005–1.030)
Urobilinogen, UA: 0.2 mg/dL (ref 0.0–1.0)
pH: 5.5 (ref 5.0–8.0)

## 2012-05-04 LAB — COMPREHENSIVE METABOLIC PANEL
Alkaline Phosphatase: 60 U/L (ref 39–117)
BUN: 31 mg/dL — ABNORMAL HIGH (ref 6–23)
CO2: 25 mEq/L (ref 19–32)
Calcium: 8.4 mg/dL (ref 8.4–10.5)
GFR calc Af Amer: 39 mL/min — ABNORMAL LOW (ref >90)
GFR calc non Af Amer: 33 mL/min — ABNORMAL LOW (ref >90)
Glucose, Bld: 130 mg/dL — ABNORMAL HIGH (ref 70–99)
Total Protein: 6.6 g/dL (ref 6.0–8.3)

## 2012-05-04 LAB — CBC WITH DIFFERENTIAL/PLATELET
Eosinophils Absolute: 0 10*3/uL (ref 0.0–0.7)
Eosinophils Relative: 0 % (ref 0–5)
HCT: 30.3 % — ABNORMAL LOW (ref 39.0–52.0)
Hemoglobin: 9.7 g/dL — ABNORMAL LOW (ref 13.0–17.0)
Lymphocytes Relative: 7 % — ABNORMAL LOW (ref 12–46)
Lymphs Abs: 0.9 10*3/uL (ref 0.7–4.0)
MCH: 29.5 pg (ref 26.0–34.0)
MCV: 92.1 fL (ref 78.0–100.0)
Monocytes Relative: 9 % (ref 3–12)
RBC: 3.29 MIL/uL — ABNORMAL LOW (ref 4.22–5.81)

## 2012-05-04 LAB — INFLUENZA PANEL BY PCR (TYPE A & B)
Influenza A By PCR: NEGATIVE
Influenza B By PCR: NEGATIVE

## 2012-05-04 LAB — LACTIC ACID, PLASMA: Lactic Acid, Venous: 1.3 mmol/L (ref 0.5–2.2)

## 2012-05-04 LAB — URINE MICROSCOPIC-ADD ON

## 2012-05-04 LAB — TROPONIN I: Troponin I: <0.3 ng/mL (ref <0.30)

## 2012-05-04 MED ORDER — OSELTAMIVIR PHOSPHATE 75 MG PO CAPS
75.0000 mg | ORAL_CAPSULE | Freq: Two times a day (BID) | ORAL | Status: DC
  Administered 2012-05-04 (×2): 75 mg via ORAL
  Filled 2012-05-04 (×5): qty 1

## 2012-05-04 MED ORDER — LOSARTAN POTASSIUM 50 MG PO TABS
50.0000 mg | ORAL_TABLET | Freq: Two times a day (BID) | ORAL | Status: DC
  Administered 2012-05-04 – 2012-05-10 (×12): 50 mg via ORAL
  Filled 2012-05-04 (×14): qty 1

## 2012-05-04 MED ORDER — ALBUTEROL SULFATE HFA 108 (90 BASE) MCG/ACT IN AERS: 2.0000 | INHALATION_SPRAY | Freq: Four times a day (QID) | RESPIRATORY_TRACT | Status: DC | PRN

## 2012-05-04 MED ORDER — CARVEDILOL 25 MG PO TABS: 25.0000 mg | ORAL_TABLET | Freq: Two times a day (BID) | ORAL | Status: DC

## 2012-05-04 MED ORDER — SODIUM CHLORIDE 0.9 % IV SOLN
250.0000 mL | INTRAVENOUS | Status: DC | PRN
  Administered 2012-05-05 – 2012-05-07 (×2): 250 mL via INTRAVENOUS

## 2012-05-04 MED ORDER — MILRINONE IN DEXTROSE 20 MG/100ML IV SOLN
0.1250 ug/kg/min | INTRAVENOUS | Status: DC
  Administered 2012-05-04 – 2012-05-07 (×3): 0.125 ug/kg/min via INTRAVENOUS
  Filled 2012-05-04 (×3): qty 100

## 2012-05-04 MED ORDER — ALBUTEROL SULFATE HFA 108 (90 BASE) MCG/ACT IN AERS: 2.0000 | INHALATION_SPRAY | RESPIRATORY_TRACT | Status: DC | PRN

## 2012-05-04 MED ORDER — CARVEDILOL 25 MG PO TABS
25.0000 mg | ORAL_TABLET | Freq: Two times a day (BID) | ORAL | Status: DC
  Administered 2012-05-04 – 2012-05-10 (×13): 25 mg via ORAL
  Filled 2012-05-04 (×16): qty 1

## 2012-05-04 MED ORDER — ACETAMINOPHEN 325 MG PO TABS
650.0000 mg | ORAL_TABLET | ORAL | Status: DC | PRN
  Administered 2012-05-04: 650 mg via ORAL
  Filled 2012-05-04: qty 2

## 2012-05-04 MED ORDER — POTASSIUM CHLORIDE CRYS ER 20 MEQ PO TBCR
20.0000 meq | EXTENDED_RELEASE_TABLET | Freq: Once | ORAL | Status: AC
  Administered 2012-05-04: 20 meq via ORAL
  Filled 2012-05-04: qty 1

## 2012-05-04 MED ORDER — DEXTROSE 5 % IV SOLN
500.0000 mg | Freq: Every day | INTRAVENOUS | Status: DC
  Administered 2012-05-04 – 2012-05-05 (×2): 500 mg via INTRAVENOUS
  Filled 2012-05-04 (×3): qty 500

## 2012-05-04 MED ORDER — FUROSEMIDE 40 MG PO TABS
40.0000 mg | ORAL_TABLET | Freq: Every day | ORAL | Status: DC
  Administered 2012-05-06 – 2012-05-10 (×5): 40 mg via ORAL
  Filled 2012-05-04 (×6): qty 1

## 2012-05-04 MED ORDER — HYDROCODONE-ACETAMINOPHEN 5-325 MG PO TABS
1.0000 | ORAL_TABLET | Freq: Four times a day (QID) | ORAL | Status: DC | PRN
  Administered 2012-05-04 – 2012-05-10 (×12): 1 via ORAL
  Filled 2012-05-04 (×12): qty 1

## 2012-05-04 MED ORDER — DEXTROSE 5 % IV SOLN
1.0000 g | Freq: Every day | INTRAVENOUS | Status: DC
  Administered 2012-05-04 – 2012-05-05 (×2): 1 g via INTRAVENOUS
  Filled 2012-05-04 (×3): qty 10

## 2012-05-04 MED ORDER — ALBUTEROL SULFATE (5 MG/ML) 0.5% IN NEBU
2.5000 mg | INHALATION_SOLUTION | Freq: Three times a day (TID) | RESPIRATORY_TRACT | Status: DC
  Administered 2012-05-04 – 2012-05-05 (×2): 2.5 mg via RESPIRATORY_TRACT
  Filled 2012-05-04: qty 0.5

## 2012-05-04 MED ORDER — SODIUM CHLORIDE 0.9 % IJ SOLN
3.0000 mL | Freq: Two times a day (BID) | INTRAMUSCULAR | Status: DC
  Administered 2012-05-04 – 2012-05-09 (×9): 3 mL via INTRAVENOUS

## 2012-05-04 MED ORDER — SODIUM CHLORIDE 0.9 % IJ SOLN
3.0000 mL | INTRAMUSCULAR | Status: DC | PRN
  Administered 2012-05-06: 3 mL via INTRAVENOUS

## 2012-05-04 MED ORDER — RIVAROXABAN 20 MG PO TABS
20.0000 mg | ORAL_TABLET | Freq: Every evening | ORAL | Status: DC
  Filled 2012-05-04: qty 1

## 2012-05-04 MED ORDER — ONDANSETRON HCL 4 MG/2ML IJ SOLN: 4.0000 mg | Freq: Four times a day (QID) | INTRAMUSCULAR | Status: DC | PRN

## 2012-05-04 MED ORDER — RIVAROXABAN 15 MG PO TABS
15.0000 mg | ORAL_TABLET | Freq: Every day | ORAL | Status: DC
  Administered 2012-05-04 – 2012-05-07 (×4): 15 mg via ORAL
  Filled 2012-05-04 (×5): qty 1

## 2012-05-04 MED ORDER — TAMSULOSIN HCL 0.4 MG PO CAPS
0.4000 mg | ORAL_CAPSULE | Freq: Two times a day (BID) | ORAL | Status: DC
  Administered 2012-05-04 – 2012-05-10 (×13): 0.4 mg via ORAL
  Filled 2012-05-04 (×14): qty 1

## 2012-05-04 MED ORDER — ALBUTEROL SULFATE HFA 108 (90 BASE) MCG/ACT IN AERS
2.0000 | INHALATION_SPRAY | Freq: Four times a day (QID) | RESPIRATORY_TRACT | Status: DC
  Administered 2012-05-04: 2 via RESPIRATORY_TRACT
  Filled 2012-05-04: qty 6.7

## 2012-05-04 MED ORDER — FUROSEMIDE 10 MG/ML IJ SOLN
60.0000 mg | Freq: Two times a day (BID) | INTRAMUSCULAR | Status: DC
  Administered 2012-05-04: 60 mg via INTRAVENOUS
  Filled 2012-05-04 (×3): qty 6

## 2012-05-04 MED ORDER — HYDRALAZINE HCL 10 MG PO TABS
10.0000 mg | ORAL_TABLET | Freq: Three times a day (TID) | ORAL | Status: DC
  Administered 2012-05-04 – 2012-05-08 (×11): 10 mg via ORAL
  Filled 2012-05-04 (×15): qty 1

## 2012-05-04 MED ORDER — PANTOPRAZOLE SODIUM 40 MG PO TBEC
80.0000 mg | DELAYED_RELEASE_TABLET | Freq: Two times a day (BID) | ORAL | Status: DC
  Administered 2012-05-04 – 2012-05-10 (×13): 80 mg via ORAL
  Filled 2012-05-04: qty 2
  Filled 2012-05-04: qty 1
  Filled 2012-05-04 (×2): qty 2
  Filled 2012-05-04: qty 1
  Filled 2012-05-04 (×3): qty 2
  Filled 2012-05-04: qty 1
  Filled 2012-05-04 (×2): qty 2
  Filled 2012-05-04: qty 1
  Filled 2012-05-04: qty 2

## 2012-05-04 MED ORDER — ALPRAZOLAM 0.25 MG PO TABS
0.2500 mg | ORAL_TABLET | Freq: Every day | ORAL | Status: DC | PRN
  Administered 2012-05-06: 0.25 mg via ORAL
  Filled 2012-05-04: qty 1

## 2012-05-04 MED ORDER — FUROSEMIDE 10 MG/ML IJ SOLN: 60.0000 mg | Freq: Two times a day (BID) | INTRAMUSCULAR | Status: DC

## 2012-05-04 MED ORDER — ALBUTEROL SULFATE (5 MG/ML) 0.5% IN NEBU
2.5000 mg | INHALATION_SOLUTION | RESPIRATORY_TRACT | Status: DC | PRN
  Filled 2012-05-04 (×3): qty 0.5

## 2012-05-04 MED ORDER — ACETAMINOPHEN 325 MG PO TABS
650.0000 mg | ORAL_TABLET | Freq: Once | ORAL | Status: AC
  Administered 2012-05-04: 650 mg via ORAL
  Filled 2012-05-04: qty 2

## 2012-05-04 NOTE — ED Notes (Signed)
Attempted to call report to floor.  Was told RN would return my call shortly.   

## 2012-05-04 NOTE — Plan of Care (Signed)
Problem: Phase I Progression Outcomes Goal: EF % per last Echo/documented,Core Reminder form on chart Outcome: Completed/Met Date Met:  05/04/12 Bedside 2-D echo performed today. Goal: Up in chair, BRP Outcome: Progressing Ambulating to bathroom with standby assistance.

## 2012-05-04 NOTE — ED Provider Notes (Signed)
I saw and evaluated the patient, reviewed the resident's note and I agree with the findings and plan.   Shelda Jakes, MD 05/04/12 [phone]

## 2012-05-04 NOTE — Progress Notes (Signed)
  Echocardiogram 2D Echocardiogram has been performed.  Andrew [name] A 05/04/2012, 11:55 AM

## 2012-05-04 NOTE — Progress Notes (Signed)
CRITICAL VALUE ALERT  Critical value received: positive blood cultures x4. Aerobic and anerobic gram + cocci in chains Date of notification:  05/04/12 Time of notification:  1120 Critical value read back:yes Nurse who received alert:  awalker MD notified (1st page):  Dr street Time of first page:  1120 MD notified (2nd page): Time of second page:  Responding MD:  Dr street Time MD responded:  1120

## 2012-05-04 NOTE — Progress Notes (Signed)
PCR for Influenza negative.  Droplet Isolation discontinued per protocol.

## 2012-05-04 NOTE — Progress Notes (Signed)
Interim Note  Paged by nursing. Results for blood cultures positive for Gram positive cocci in chains. Pt not spiking new fevers, hemodynamically stable. Will continue Rocephin/azithro for now and await speciation. If any decline overnight or new fevers, will expand coverage (likely add vanc).  Bobbye Morton, MD PGY-1, [facility] FPTS Intern pager: [phone]

## 2012-05-04 NOTE — H&P (Signed)
Seen and examined.  Discussed with Dr. Konrad Dolores.  Agree with his management and documentation.  Appreciated cards help.  Briefly, 79 yo male with extensive cardiac history as outlined by resident note presents with worsening shortness of breath along with generalized fatigue and weakness.  Issues: CHF: acute exacerbation of chronic systolic dysfunction CHF.  Cards will lead the treatment on the man.  The severity and the fact that he has an EF of 15% suggests to me that we should begin palliative care discussions (if this has not already been done.)  There is nothing particularly worrisome about his current presentation - it just seems like time to start talking.  Consider palliative care consult. Possible pneumonia.  Certainly with fever in ER and CXR suggestive of RML pneumonia, it is prudent to treat him.  Clinically, the presentation points me more in the direction of CHF than of pneumonia.   For other issues, please see resident note.

## 2012-05-04 NOTE — Care Management Note (Unsigned)
    Page 1 of 1   05/04/2012     3:39:20 PM   CARE MANAGEMENT NOTE 05/04/2012  Patient:  Andrew [name], Andrew [name]   Account Number:  [id number]  Date Initiated:  05/04/2012  Documentation initiated by:  Tera Mater  Subjective/Objective Assessment:   79yo mlae admitted with PNA.  Pt. lives at home with spouse.     Action/Plan:   In to complete Heart Failure Home Health Screen.  Pt. is not interested in having HH services at this time.  He states he spouse helps him.   Anticipated DC Date:  05/06/2012   Anticipated DC Plan:  HOME/SELF CARE         Choice offered to / List presented to:             Status of service:  In process, will continue to follow Medicare Important Message given?   (If response is "NO", the following Medicare IM given date fields will be blank) Date Medicare IM given:   Date Additional Medicare IM given:    Discharge Disposition:    Per UR Regulation:  Reviewed for med. necessity/level of care/duration of stay  If discussed at Long Length of Stay Meetings, dates discussed:    Comments:  05/04/12 1515 In to speak with pt. about [facility] services.  At this time, the pt. does not want HH.  NCM to follow. Tera Mater, RN, BSN NCM [phone]

## 2012-05-04 NOTE — H&P (Signed)
Family Medicine Teaching [facility] Admission History and Physical Service Pager: [phone]  Patient name: Andrew [name] Medical record number: [id number] Date of birth: 1930-04-28 Age: 79 y.o. Gender: male  Primary Care Provider: Lucilla Edin, MD  Chief Complaint: weakness, fatigue  Assessment and Plan: Andrew [name] is a 79 y.o. year old male with PMH significant for ischemic CHF with reduced EF (15% in 02/2011) with biventricular pacer, CAD s/p multiple MI (multiple stenting, remote hx of CABG), hypertension, paroxysmal a-fib/a-flutter presenting with acutely worsening fatigue and weakness for the past few days and gradually for the past few weeks. Pt febrile in the ED to 101. CXR consistent with chronic/acute-on-chronic increased pulmonary congestion/edema and possible RML early infiltrate; with elevated WBC 16.3, fever, and BNP 10.8k, concern is for CAP with superimposed CHF exacerbation. Admitting 1/30 to FPTS, telemetry bed, Dr. Leveda Anna as admitting attending MD.  #CAP - Presumptive given slightly increased O2 demand (pt uses 2L [place] at night, at home), increased WBC, and appearance of CXR. No recent hospitalization to suggest HCAP. -Rocephin and azithromycin initiated IV in the ED, will continue and transition to PO as appropriate -continue O2 by [place] as needed; albuterol PRN -will follow up blood cultures, urine Strep pneumo and Legionella antigens  #CHF with reduced EF (15%)/CAD/paroxysmal a-fib/flutter - ischemic cardiomyopathy s/p multiple MI's with stents and remote hx of CABG; biventricular pacer in place, . Pt followed by [facility] (last seen 1/27 by NP; followed by Drs. Hochrein and Graciela Husbands). Troponin negative x1 in the ED, second pending. S/p Lasix 40 mg IV in the ED. Home Lasix reported as 40 mg daily -will order for Lasix 60 mg IV BID initially based on DOSE trial, then transition to PO as appropriate; monitor BMP daily -ordered for 2D echocardiogram -will follow up  troponins -daily weights, strict I&O; continue telemetry monitoring -continue Xarelto; does not report daily ASA at home -will likely consult cardiology in the AM; defer decision to consult heart failure team specifically for now -consult to care management for home heart failure screen - consult heart failure team  #HTN - BP with some fluctuation, 130's/40's, 140's/60's, 120's/40's -recent adjustment to hydralazine dose at home, but pt seems to manage dose on his own -continue Coreg, Cozaar; hold hydralazine for now, may add back as needed  #Chronic arthritic pain/anxiety - No new/worse complaints -ordered for Norco 5-235 q6 and Xanax 0.25 daily PRN (home doses)  #BPH - On Flomax, continue. No current new/worse complaints.  #GERD - On Nexium at home, 40 mg BID -substitute Protonix while admitted  FEN/GI: heart-healthy diet, saline lock IV  Prophylaxis: Therapeutic anticoagulation (Xarelto) and PPI (Protonix for home Nexium)  Disposition: Management as above. Anticipate discharge home to self-care with clinical improvement. Code Status: Full code  History of Present Illness: Andrew [name] is a 79 y.o. year old male with PMH significant for ischemic CHF with reduced EF (15% in 02/2011) with biventricular pacer, CAD s/p multiple MI (multiple stenting, remote hx of CABG), hypertension, paroxysmal a-fib/a-flutter presenting with acutely worsening fatigue and weakness for the past few days and gradually for the past few weeks. Pt felt well over the weekend and felt poorly Monday (weak, very tired, etc) and was seen at [facility] and had his hydralazine dose reduced. Tuesday night, reports he "felt terrible, again" very fatigued/weak but not truly short of breath. He also denies chest pain or orthopnea. He also has pain/tenderness in his feet ("I can't stand anything touching them") but no  large amount of new swelling. Pt complains of chronic cough not truly worsened, with chronic  whitish/clear phlegm production, also not worse than usual. Denies hemoptysis.  Per record review, pt followed by Drs. Hochrein and Graciela Husbands, saw Norma Fredrickson, NP on 1/27; pt has had intermittent hypotensive episodes at home and was instructed to adjust his hydralazine doses as tolerated; Coreg dosing times was also recently changed to morning and mid-afternoon.  Pt manages some of his own meds PRN at home, mostly Lasix and hydralazine. Pt reports he takes Lasix daily and sometimes takes two per day with extra potassium, based on his weight trends. Pt reports two to three weeks ago, he started only taking 1 Lasix per day due to having some low potassium at an outpatient visit. Per Ms. Gerhardt's note, he was instructed to take hydralazine 25 mg TID, but has apparently been taking it intermittently or once per day.  Otherwise, has few complaints. Some little nausea with cough and phlegm production as above, but no vomiting. No chest pain/palpitations, as above. No diarrhea, abdominal pain.  Patient Active Problem List  Diagnosis  . UNSPECIFIED ANEMIA  . COPD UNSPECIFIED  . GERD  . DYSPNEA  . Nonspecific (abnormal) findings on radiological and other examination of body structure  . LIVER FUNCTION TESTS, ABNORMAL, HX OF  . PERSONAL HISTORY OF COLONIC POLYPS  . ABNORMAL LUNG XRAY  . Atrial flutter  . Chronic systolic heart failure  . Hypertension  . Ischemic cardiomyopathy  . Paroxysmal ventricular tachycardia  . Hyperlipidemia  . Coronary atherosclerosis of native coronary artery  . Acute renal insufficiency  . Atrial fibrillation  . S/P biventricular cardiac pacemaker-[employer]   Past Medical History: Past Medical History  Diagnosis Date  . Chronic systolic heart failure     chronic LV systolic failure; last echo in December of 2011 showed EF of 25%; echo 02/2011: Mild LVH, EF 15%, posterior lateral akinesis, inferior akinesis, grade 1 diastolic dysfunction, mild LAE   . Ischemic heart  disease   . Atrial flutter     off amiodarone because of increased LFTs  . Shortness of breath     chronic sob with known COPD and probable chronic bronchitis  . COPD (chronic obstructive pulmonary disease)   . Bronchitis, chronic   . Myocardial infarction 1994  . Gastro - esophageal reflux   . Claudication   . BPH (benign prostatic hyperplasia)   . Coronary artery disease     status post myocardial infarction in 1984,  99 and 07.  He has multiple RCA stents;  LHC 04/2011:oLM 25%, mLAD 30%, pCFX 25%, prox to mid RCA stent ok, dRCA 30% and 70%, pPDA 25%, mPL 25-30%, EF 25%  . Hypertension   . Atrial fibrillation   . Hemorrhoids   . Hyperlipidemia   . Hx of adenomatous colonic polyps   . Hx of colonoscopy   . Chronic renal insufficiency   . Biventricular cardiac pacemaker in situ 3.2013   Past Surgical History: Past Surgical History  Procedure Date  . Cardiac catheterization 2008  . Cataract extraction   . Salivary gland surgery   . Coronary angioplasty with stent placement   . Insert / replace / remove pacemaker 06/16/2011   Social History: History  Substance Use Topics  . Smoking status: Former Smoker -- 1.5 packs/day for 55 years    Quit date: 01/03/2009  . Smokeless tobacco: Never Used  . Alcohol Use: Yes     Comment: occasional   For any additional  social history documentation, please refer to relevant sections of EMR.  Family History: Family History  Problem Relation Age of Onset  . Coronary artery disease Father   . Heart disease Mother   . Colon cancer Neg Hx   . Heart disease Brother    Allergies: Allergies  Allergen Reactions  . Codeine Nausea And Vomiting  . Inspra (Eplerenone) Other (See Comments)    Stomach problems  . Spironolactone Other (See Comments)    Gynecomastia  . Doxycycline Itching and Rash  . Sulfonamide Derivatives Itching and Rash   No current facility-administered medications on file prior to encounter.   Current Outpatient  Prescriptions on File Prior to Encounter  Medication Sig Dispense Refill  . albuterol (2.5 MG/3ML) 0.083% NEBU 3 mL, albuterol (5 MG/ML) 0.5% NEBU 0.5 mL Inhale 2.5 mg into the lungs 3 (three) times daily as needed. For shortness of breath.      . Albuterol Sulfate (PROAIR HFA IN) Inhale 2 puffs into the lungs every 8 (eight) hours as needed. Shortness of breath.      . ALPRAZolam (XANAX) 0.25 MG tablet Take 0.25 mg by mouth daily as needed. For anxiety.      . fluticasone (FLONASE) 50 MCG/ACT nasal spray Place 1 spray into both nostrils daily.      [name] HYDROcodone-acetaminophen (VICODIN) 5-500 MG per tablet Take 1 tablet by mouth every 6 (six) hours as needed. For pain.      . potassium chloride (K-DUR) 10 MEQ tablet Take 10 mEq by mouth daily as needed. For potassium level regulation, take potassium as needed when taking both lasix tablets.      . Prenatal Vit-Fe Fumarate-FA (PRENATAL MULTIVITAMIN) TABS Take 1 tablet by mouth every evening.  90 tablet  2  . Rivaroxaban (XARELTO) 20 MG TABS Take 20 mg by mouth daily.  90 tablet  3  . Tamsulosin HCl (FLOMAX) 0.4 MG CAPS Take 0.4 mg by mouth 2 (two) times daily.       . nitroGLYCERIN (NITROSTAT) 0.4 MG SL tablet Place 1 tablet (0.4 mg total) under the tongue every 5 (five) minutes as needed for chest pain.  25 tablet  6  . [DISCONTINUED] metoprolol tartrate (LOPRESSOR) 25 MG tablet Take 1/2 tablet every other day   45 tablet  3   Review Of Systems: Per HPI.  Physical Exam: BP 123/40  Pulse 75  Temp 101.4 F (38.6 C) (Rectal)  Resp 24  SpO2 90% Exam: General: elderly male, lying in bed in NAD; initially with [place] in place but no oxygen flowing, sats ~87 --> >95 with 2L HEENT: [place]/AT, EOMI, MMM moist, sclerae/conjunctivae clear Cardiovascular: RRR, no murmur appreciated; slightly distant/soft heart sounds Respiratory: faint wheezes in upper fields and crackles bilaterally in bases  Increased WOB  Diminished sounds throughout but overall good  bilateral air movement with mildly increased WOB on 2L [place]  Right middle lobe with increased transmission of spoken sounds; no true egophony Abdomen: soft, nontender, nondistended, BS+ Extremities: warm, dry, intact; trace LE swelling, symmetric; skin slightly tight to bilateral feet/ankles, mild tenderness Skin: warm, dry, intact; scarring/scab to dorsum of right hand, pruritic but not tender or inflamed/indurated Neuro: moves all extremities equally/spontaneously, no facial droop or slurred speech, alert/oriented, CN grossly intact  Labs and Imaging: CBC BMET   Lab 05/03/12 2245  WBC 16.3*  HGB 9.8*  HCT 29.8*  PLT 215    Lab 05/03/12 2245  NA 137  K 4.0  CL 103  CO2 23  BUN 27*  CREATININE 1.61*  GLUCOSE 132*  CALCIUM 8.6     Cardiac Panel (last 3 results)  Basename 05/03/12 2245  CKTOTAL --  CKMB --  TROPONINI <0.30  RELINDX --   INR 1/29: 1.77 BNP 1/29: 16109  Micro:  Blood cultures, 1/29 @2356  and 2358: pending  Lactic acid, 1/29 @2356 : 1.3   05/04/2012 01:09  Color, Urine YELLOW  APPearance CLEAR  Specific Gravity, Urine 1.013  pH 5.5  Glucose NEGATIVE  Bilirubin Urine NEGATIVE  Ketones, ur NEGATIVE  Protein 100 (A)  Urobilinogen, UA 0.2  Nitrite NEGATIVE  Leukocytes, UA NEGATIVE  Hgb urine dipstick NEGATIVE  WBC, UA 0-2  RBC / HPF 3-6  Squamous Epithelial / LPF RARE  Bacteria, UA RARE  Casts HYALINE CASTS (A)   CXR, 1/29 @2256  Findings: Cardiomegaly. Central vascular congestion. Left chest  wall battery pack with unchanged lead tip positions. Increased  interstitial markings and peribronchial thickening. Mild right  greater than left apical scarring/pleural thickening and small  right upper lobe bulla. Otherwise, no confluent airspace opacity.  No pleural effusion or pneumothorax. No acute osseous finding.  IMPRESSION:  Increased interstitial markings and peribronchial thickening may be  chronic or acute on chronic; as can be seen with  edema or  bronchitis.  Street, [place], MD 05/04/2012, 2:05 AM   -------------------------------------------------------------------------------------  Upper Level Addendum in BLUE  Pt seen and examined w/ Dr. Casper Harrison and agree w/ his assessment and plan as outlined in his h&P  Shelly Flatten, MD Family Medicine PGY-2 05/04/2012, 7:33 AM

## 2012-05-04 NOTE — Consult Note (Signed)
CARDIOLOGY CONSULT NOTE  Patient ID: Andrew [name], MRN: [id number], DOB/AGE: [birth year] 79 y.o. Admit date: 05/03/2012   Date of Consult: 05/04/2012 Primary Physician: Lucilla Edin, MD Primary Cardiologist: Kolbe Delmonaco  Chief Complaint: weakness Reason for Consult: CHF  HPI: Andrew [name] is a 79 y/o M with history of CAD s/p multiple RCA stents, ICM/chronic systolic CHF with EF 15% by echo 02/2011 s/p Bi-V pacemaker 06/2011, COPD on home O2, PAF/flutter on Xarelto, & chronic renal insufficiency (baseline Cr 1.4) who presented to [facility] with complaints of worsening fatigue & rhinorrhea for the past few days and fever of 101.8 in the ER. In retrospect, he's also had worsening weakness over the last few weeks. He was just seen in the office 3 days ago for work-in visit for CHF check with weight 193 at that time; is 186 today (baseline weight 186). He was having some hypotension and appeared compensated. He was to continue current meds but cut back on hydralazine if the low BP persisted which he did to 25mg  TID. Over the last few days, he has also had myalgias and increased SOB. No change in chronic productive cough. He denies orthopnea, PND, significant LEE, or weight changes. In the ER, CXR showed increased interstitial markings and peribronchial thickening which can be seen in edema or bronchitis. WBC 16.3, Hgb 9.8, lactic acid WNL, troponin neg x 3, Cr 1.61, pBNP 10883, UA ok.  He was given 40mg  IV Lasix last night & 60mg  IV this AM with limited I/O info available. Tamiflu & abx empirically started with flu panel pending. Overnight he had 1 minute of chest pain without acute EKG changes,a "solid" pain (hard time characterizing) on the L side without diaphoresis or nausea. It resolved spontaneously. He also had acute dyspnea and was placed back on his home scheduled nebulizer treatments. This morning he feels a little better but still feels somewhat weak. Temp still 99.9 this AM with tylenol on  board.  Past Medical History  Diagnosis Date  . Chronic systolic heart failure     a. Chronic LV systolic failure. b. Echo in Dec 2011 showed EF of 25% - f/u echo 02/2011: Mild LVH, EF 15%, posterior lateral akinesis, inferior akinesis, grade 1 diastolic dysfunction, mild LAE. c. s/p BiV pacemaker 06/2011.  [name] Atrial flutter     a. Off amiodarone because of increased LFTs  . COPD (chronic obstructive pulmonary disease)     Chronic SOB, O2 dependent  . Myocardial infarction 1994  . Gastro - esophageal reflux   . Claudication   . BPH (benign prostatic hyperplasia)   . Coronary artery disease     a. Myocardial infarction in 1984,  99 and 07. b. H/o multiple RCA stents. c.  LHC 04/2011: without obstructive disease. oLM 25%, mLAD 30%, pCFX 25%, prox to mid RCA stent ok, dRCA 30% and 70%, pPDA 25%, mPL 25-30%, EF 25%.  [name] Hypertension   . Atrial fibrillation     a. On Xarelto.  . Hemorrhoids   . Hyperlipidemia   . Hx of adenomatous colonic polyps   . Hx of colonoscopy   . Chronic renal insufficiency   . Biventricular cardiac pacemaker in situ 3.2013      Most Recent Cardiac Studies: 2D Echo 02/2011 Study Conclusions - Left ventricle: The cavity size was mildly dilated. Wall thickness was increased in a pattern of mild LVH. The estimated ejection fraction was 15%. Diffuse hypokinesis. There is akinesis of the posterolateral myocardium. There is akinesis  of the inferior myocardium. Doppler parameters are consistent with abnormal left ventricular relaxation (grade 1 diastolic dysfunction). - Left atrium: The atrium was mildly dilated. - Pericardium, extracardiac: A trivial pericardial effusion was identified.  Cardiac Cath 04/2011 Procedural Findings:  Hemodynamics:  AO 126/12  LV 124/81  Coronary angiography:  Coronary dominance: Right  Left mainstem: Separate ostia  Left anterior descending (LAD): Proximal luminal irregularities. The first diagonal is small and normal. The second  diagonal moderate sized and branching and normal.  Left circumflex (LCx): AV groove proximal scattered 25% lesions. Large mid obtuse marginal normal.  Right coronary artery (RCA): The right coronary artery is a large dominant vessel. There luminal irregularities in the proximal and mid segment. There is 25% stenosis before the PDA. PDA is small and normal. Posterior lateral is moderate size and normal.  Left ventriculography: Left ventricular systolic function is normal, LVEF is estimated at 55-65%, there is no significant mitral regurgitation  Final Conclusions: Mild coronary plaque. Normal left ventricle function.  Recommendations: The patient has no obstructive coronary disease. He has a normal left ventricle. No further cardiovascular testing is suggested. He can continue with aggressive risk reduction. According to ACC/AHA guidelines he is at acceptable risk for the planned of vascular surgery.   Surgical History:  Past Surgical History  Procedure Date  . Cardiac catheterization 2008  . Cataract extraction   . Salivary gland surgery   . Coronary angioplasty with stent placement   . Insert / replace / remove pacemaker 06/16/2011     Home Meds: Prior to Admission medications   Medication Sig Start Date End Date Taking? Authorizing Provider  albuterol (2.5 MG/3ML) 0.083% NEBU 3 mL, albuterol (5 MG/ML) 0.5% NEBU 0.5 mL Inhale 2.5 mg into the lungs 3 (three) times daily as needed. For shortness of breath.   Yes Historical Provider, MD  Albuterol Sulfate (PROAIR HFA IN) Inhale 2 puffs into the lungs every 8 (eight) hours as needed. Shortness of breath.   Yes Historical Provider, MD  ALPRAZolam (XANAX) 0.25 MG tablet Take 0.25 mg by mouth daily as needed. For anxiety. 06/23/10  Yes Roger Shelter, MD  carvedilol (COREG) 25 MG tablet Take 25 mg by mouth 2 (two) times daily.    Yes Historical Provider, MD  esomeprazole (NEXIUM) 40 MG capsule Take 40 mg by mouth 2 (two) times daily.   Yes  Historical Provider, MD  fluticasone (FLONASE) 50 MCG/ACT nasal spray Place 1 spray into both nostrils daily. 07/22/11 07/21/12 Yes Dois Davenport, MD  furosemide (LASIX) 40 MG tablet Take 40-80 mg by mouth daily. Takes 1 tablet everyday; then based on weight gain from previous day, if >2 lbs. Take 1 additional tablet as needed.   Yes Historical Provider, MD  hydrALAZINE (APRESOLINE) 25 MG tablet Take 25 mg by mouth daily.   Yes Historical Provider, MD  HYDROcodone-acetaminophen (VICODIN) 5-500 MG per tablet Take 1 tablet by mouth every 6 (six) hours as needed. For pain.   Yes Historical Provider, MD  losartan (COZAAR) 50 MG tablet Take 50 mg by mouth 2 (two) times daily.   Yes Historical Provider, MD  potassium chloride (K-DUR) 10 MEQ tablet Take 10 mEq by mouth daily as needed. For potassium level regulation, take potassium as needed when taking both lasix tablets. 03/20/12  Yes Rollene Rotunda, MD  Prenatal Vit-Fe Fumarate-FA (PRENATAL MULTIVITAMIN) TABS Take 1 tablet by mouth every evening. 06/30/11  Yes Dois Davenport, MD  Rivaroxaban (XARELTO) 20 MG TABS Take 20 mg by  mouth daily. 05/21/11  Yes Rollene Rotunda, MD  Tamsulosin HCl (FLOMAX) 0.4 MG CAPS Take 0.4 mg by mouth 2 (two) times daily.    Yes Historical Provider, MD  nitroGLYCERIN (NITROSTAT) 0.4 MG SL tablet Place 1 tablet (0.4 mg total) under the tongue every 5 (five) minutes as needed for chest pain. 07/14/11 07/13/12  Beatrice Lecher, PA    Inpatient Medications:    . albuterol  2 puff Inhalation Q6H  . azithromycin  500 mg Intravenous QHS  . carvedilol  25 mg Oral BID WC  . cefTRIAXone (ROCEPHIN)  IV  1 g Intravenous QHS  . furosemide  60 mg Intravenous BID  . losartan  50 mg Oral BID  . oseltamivir  75 mg Oral BID  . pantoprazole  80 mg Oral BID  . Rivaroxaban  20 mg Oral QPM  . sodium chloride  3 mL Intravenous Q12H  . Tamsulosin HCl  0.4 mg Oral BID    Allergies:  Allergies  Allergen Reactions  . Codeine Nausea And  Vomiting  . Inspra (Eplerenone) Other (See Comments)    Stomach problems  . Spironolactone Other (See Comments)    Gynecomastia  . Doxycycline Itching and Rash  . Sulfonamide Derivatives Itching and Rash    History   Social History  . Marital Status: Married    Spouse Name: N/A    Number of Children: N/A  . Years of Education: N/A   Occupational History  . retired Designer, industrial/product work    Social History Main Topics  . Smoking status: Former Smoker -- 1.5 packs/day for 55 years    Quit date: 01/03/2009  . Smokeless tobacco: Never Used  . Alcohol Use: Yes     Comment: occasional  . Drug Use: No  . Sexually Active: Not Currently   Other Topics Concern  . Not on file   Social History Narrative  . No narrative on file     Family History  Problem Relation Age of Onset  . Coronary artery disease Father   . Heart disease Mother   . Colon cancer Neg Hx   . Heart disease Brother      Review of Systems: General: see above. Had chills this AM that he relayed to the window blinds being open, but is unsure if they were shaking chills.  Cardiovascular: no palpitations. See above Dermatological: negative for rash Respiratory: chronic productive cough Urologic: negative for hematuria Abdominal: negative for nausea, vomiting, diarrhea, bright red blood per rectum, melena, or hematemesis Neurologic: negative for visual changes, syncope, or dizziness All other systems reviewed and are otherwise negative except as noted above.  Labs:  [facility] 05/04/12 1610 05/04/12 0154 05/03/12 2245  CKTOTAL -- -- --  CKMB -- -- --  TROPONINI <0.30 <0.30 <0.30   Lab Results  Component Value Date   WBC 13.0* 05/04/2012   HGB 9.7* 05/04/2012   HCT 30.3* 05/04/2012   MCV 92.1 05/04/2012   PLT 194 05/04/2012    Lab 05/04/12 0635  NA 135  K 3.5  CL 100  CO2 25  BUN 31*  CREATININE 1.83*  CALCIUM 8.4  PROT 6.6  BILITOT 0.5  ALKPHOS 60  ALT 9  AST 18  GLUCOSE 130*   Lab Results    Component Value Date   CHOL 136 03/17/2012   HDL 30.70* 03/17/2012   LDLCALC 83 03/17/2012   TRIG 113.0 03/17/2012     Radiology/Studies:  Dg Chest 2 View 05/03/2012  *RADIOLOGY REPORT*  Clinical  Data: Respiratory distress  CHEST - 2 VIEW  Comparison: 03/10/2012  Findings: Cardiomegaly.  Central vascular congestion.  Left chest wall battery pack with unchanged lead tip positions.  Increased interstitial markings and peribronchial thickening. Mild right greater than left apical scarring/pleural thickening and small right upper lobe bulla.  Otherwise, no confluent airspace opacity. No pleural effusion or pneumothorax.  No acute osseous finding.  IMPRESSION: Increased interstitial markings and peribronchial thickening may be chronic or acute on chronic; as can be seen with edema or bronchitis.   Original Report Authenticated By: Jearld Lesch, M.D.     EKG: NSR, ventricular paced, 76bpm, nonspecific IVCD, nonspecific ST-T changes 6 beats nsvt on tele  Physical Exam: Blood pressure 122/52, pulse 84, temperature 99.9 F (37.7 C), temperature source Oral, resp. rate 18, height 6' (1.829 m), weight 186 lb 1.1 oz (84.4 kg), SpO2 100.00%. General: Well developed, well nourished WM in no acute distress. Head: Normocephalic, atraumatic, sclera non-icteric, no xanthomas, nares are without discharge.  Neck: Negative for carotid bruits. JVD minimally elevated. Lungs: Crackles at L base. Poor air movement at bases. No rhonchi or wheezing. Breathing is unlabored. Heart: Distant heart sounds with RRR with S1 S2. No murmurs, rubs, or gallops appreciated. Abdomen: Soft, non-tender, non-distended with normoactive bowel sounds. No hepatomegaly. No rebound/guarding. No obvious abdominal masses. Msk:  Strength and tone appear normal for age. Extremities: No clubbing or cyanosis. Tr sockline edema.  Distal pedal pulses are 2+ and equal bilaterally. Neuro: Alert and oriented X 3. No facial asymmetry. No focal  deficit. Moves all extremities spontaneously. Psych:  Responds to questions appropriately with a normal affect.   Assessment and Plan:  1. Acute bronchitis  2. COPD with chronic respiratory failure on home O2 3. Chronic systolic CHF s/p Bi-V pacemaker with EF 15% in 2012 with low-output symptoms 4. CAD s/p multiple RCA stents 5. PAF/flutter, on Xarelto 6. Acute on chronic renal insufficiency 7. HTN, controlled with recent hypotension with med reduction 8. NSVT, 6 beats 9. Anemia  His presenting problem appears to be infectious in nature, possibly bronchitis. But his volume status has been a delicate balance in the past and he may have come in some extra fluid as evidenced by his elevated pBNP. He describes low output symptoms like fatigue and weakness.  His weight is currently at baseline and we will change him back to his home dose of Lasix starting tomorrow (already got IV today), but will start milrinone at renal dosing for inotropic support. Will give low dose KCl today and follow BMET in AM (goal with NSVT is to keep lytes controlled with K>/=4.0, Mg>/=2.0 - he was not previously felt to be a good defibrillator candidate due to O2 dependent COPD, renal insufficiency, and other comorbidities).   His Cr will need to be monitored carefully. With his fluctuating renal status and overall frailty, will decrease Xarelto dose to 15mg  daily. Add back home hydralazine at lower dose. Continue ARB. His chest pain was somewhat atypical and will follow for future recurrences. No acute evidence of ischemia this admission. Will need anemia workup per IM. Await echocardiogram results.  Signed, Dayna Dunn PA-C 05/04/2012, 12:11 PM   History and all data above reviewed.  Patient examined.  I agree with the findings as above. The patient presents with increased fatigue and dyspnea.  He has a severe ischemic cardiomyopathy and is marginally compensated at best.  He has had no new weight gain, edema, PND or  orthopnea.  Objective findings included  an elevated BNP.  CXR shows some peribronchial thickening but does not look like pulmonary edema overtly.  He was febrile on presentation. The patient exam reveals COR:RRR, Neck:  No JVD at 45 degrees.  Lungs: Decreased breath sounds  ,  Abd: Positive bowel sounds, no rebound no guarding, Ext No edema  .  All available labs, radiology testing, previous records reviewed. Agree with documented assessment and plan. Weakness, dyspnea.  I think that there is an element of low output CHF although it is difficult to assess how much this contributes.  I do not think that he has excessive volume.  I will order a low dose of milrinone.  I will restart a low dose of hydralazine and later add nitrates.  He is otherwise on maximal therapy.  I would continue with low dose oral Lasix.    Andrew [name]  2:25 PM  05/04/2012

## 2012-05-04 NOTE — Progress Notes (Addendum)
Interim Note  Pt re-evaluated at bedside. Sitting up in bed, subjective fever but no new pain. Pt reports about "1 minute of chest pain" between being evaluated by admitting providers in the ED and coming up to floor; pt states "I've had it before and never paid much attention to it, it goes away by itself." Otherwise denies current chest pain but still somewhat short of breath on RA (sats 100% on 2L [place]). No new N/V.   Objective: BP 122/52  Pulse 84  Temp 99.9 F (37.7 C) (Oral)  Resp 18  Ht 6' (1.829 m)  Wt 186 lb 1.1 oz (84.4 kg)  BMI 25.24 kg/m2  SpO2 100% No change in physical exam; diminished breath sounds bilaterally with soft crackles in bases. No new/worse LE. RRR, no murmur appreciated.   EKG from this morning (obtained due to chest pain, above, by ED provider) shows widened QRS and pacer spikes, but no frank ischemic pattern. Troponin negative x2. Consult requested from [facility]; will follow up on their recommendations. Otherwise plan per H&P from earlier this morning; continue IV abx, Lasix, O2 PRN.  Bobbye Morton, MD PGY-1, [facility] FPTS Intern pager: [phone]  Addendum: Pt also evaluated by Dr. Clinton Sawyer. Pt at that time was acutely more dyspneic on 2L [place] and reported he uses albuterol nebulizers TID at home (not previously communicated); order placed for scheduled albuterol. Pt also with significant rhinorrhea and reasonably convincing clinical picture for influenza. Flu panel collected/pending, and empiric Tamiflu started. Will continue other monitoring/management as noted previously.

## 2012-05-04 NOTE — ED Notes (Addendum)
PT stated that he was having a sudden onset of chest pain.  EKG repeated and md made aware

## 2012-05-05 ENCOUNTER — Encounter (HOSPITAL_COMMUNITY): Payer: Self-pay | Admitting: Cardiology

## 2012-05-05 DIAGNOSIS — A419 Sepsis, unspecified organism: Secondary | ICD-10-CM | POA: Diagnosis present

## 2012-05-05 LAB — BASIC METABOLIC PANEL
BUN: 31 mg/dL — ABNORMAL HIGH (ref 6–23)
Chloride: 98 mEq/L (ref 96–112)
Creatinine, Ser: 1.58 mg/dL — ABNORMAL HIGH (ref 0.50–1.35)
GFR calc Af Amer: 46 mL/min — ABNORMAL LOW (ref >90)
Glucose, Bld: 97 mg/dL (ref 70–99)
Potassium: 3.6 mEq/L (ref 3.5–5.1)

## 2012-05-05 LAB — FERRITIN: Ferritin: 142 ng/mL (ref 22–322)

## 2012-05-05 LAB — RETICULOCYTES
RBC.: 3.27 MIL/uL — ABNORMAL LOW (ref 4.22–5.81)
Retic Ct Pct: 0.9 % (ref 0.4–3.1)

## 2012-05-05 LAB — CBC
HCT: 27.7 % — ABNORMAL LOW (ref 39.0–52.0)
Hemoglobin: 9.2 g/dL — ABNORMAL LOW (ref 13.0–17.0)
MCH: 30.2 pg (ref 26.0–34.0)
MCHC: 33.2 g/dL (ref 30.0–36.0)
MCV: 90.8 fL (ref 78.0–100.0)
RDW: 14.5 % (ref 11.5–15.5)

## 2012-05-05 LAB — IRON AND TIBC: UIBC: 168 ug/dL (ref 125–400)

## 2012-05-05 MED ORDER — ALBUTEROL SULFATE (5 MG/ML) 0.5% IN NEBU
2.5000 mg | INHALATION_SOLUTION | Freq: Four times a day (QID) | RESPIRATORY_TRACT | Status: DC
  Administered 2012-05-05 – 2012-05-10 (×16): 2.5 mg via RESPIRATORY_TRACT
  Filled 2012-05-05 (×13): qty 0.5

## 2012-05-05 MED ORDER — TIOTROPIUM BROMIDE MONOHYDRATE 18 MCG IN CAPS
18.0000 ug | ORAL_CAPSULE | Freq: Every day | RESPIRATORY_TRACT | Status: DC
  Filled 2012-05-05: qty 5

## 2012-05-05 MED ORDER — PREDNISONE 20 MG PO TABS
40.0000 mg | ORAL_TABLET | Freq: Every day | ORAL | Status: DC
  Administered 2012-05-06: 40 mg via ORAL
  Filled 2012-05-05 (×2): qty 2

## 2012-05-05 NOTE — Clinical Documentation Improvement (Signed)
SEPSIS DOCUMENTATION QUERY  THIS DOCUMENT IS NOT A PERMANENT PART OF THE MEDICAL RECORD   Please update your documentation within the medical record to reflect your response to this query.                                                                                     05/05/12  Dr. Casper Harrison and/or Associates,  In a better effort to capture your patient's severity of illness, reflect appropriate length of stay and utilization of resources, a review of the patient medical record has revealed the following indicators:   - Temp in ED 101.8 at 11:27 pm on 05/03/12   - Respiratory Rate in ED 24   - WBC on admission 16.3   - Treated for CAP with Rocephin and Azithromycin   - Blood Cxs + x 2 for GRAM POSITIVE COCCI IN CHAINS       Based on your clinical judgment, please document in the progress notes and discharge summary if a condition below provides greater specificity regarding the patient's presentation:   XXXSepsis, Present on Admission  - SIRS, Present on Admission  - Other Condition  - Unable to Clinically Determine   In responding to this query please exercise your independent judgment.    The fact that a query is asked, does not imply that any particular answer is desired or expected.    Reviewed: additional documentation in the medical record; see A&P for progress note 1/31 by Dr. Casper Harrison. Will also specify in D/C summary

## 2012-05-05 NOTE — Progress Notes (Signed)
SUBJECTIVE:  Feels a little better today.  No pain.   PHYSICAL EXAM Filed Vitals:   05/04/12 1428 05/04/12 2052 05/04/12 2111 05/05/12 0622  BP: 121/48  122/50 129/56  Pulse: 75  78 86  Temp: 98.5 F (36.9 C)  99.7 F (37.6 C) 98 F (36.7 C)  TempSrc: Oral  Oral Oral  Resp: 20  18 18   Height:      Weight:    186 lb 4.6 oz (84.5 kg)  SpO2: 100% 100% 98% 94%   General:  No distress Lungs:  Few right basilar crackles Heart:  RRR Abdomen:  Positive bowel sounds, no rebound no guarding Extremities:  Trace edema.   LABS:  Results for orders placed during the hospital encounter of 05/03/12 (from the past 24 hour(s))  INFLUENZA PANEL BY PCR     Status: Normal   Collection Time   05/04/12 10:35 AM      Component Value Range   Influenza A By PCR NEGATIVE  NEGATIVE   Influenza B By PCR NEGATIVE  NEGATIVE   H1N1 flu by pcr NOT DETECTED  NOT DETECTED  TROPONIN I     Status: Normal   Collection Time   05/04/12 12:21 PM      Component Value Range   Troponin I <0.30  <0.30 ng/mL  BASIC METABOLIC PANEL     Status: Abnormal   Collection Time   05/05/12  6:55 AM      Component Value Range   Sodium 132 (*) 135 - 145 mEq/L   Potassium 3.6  3.5 - 5.1 mEq/L   Chloride 98  96 - 112 mEq/L   CO2 25  19 - 32 mEq/L   Glucose, Bld 97  70 - 99 mg/dL   BUN 31 (*) 6 - 23 mg/dL   Creatinine, Ser 1.61 (*) 0.50 - 1.35 mg/dL   Calcium 8.5  8.4 - 09.6 mg/dL   GFR calc non Af Amer 40 (*) >90 mL/min   GFR calc Af Amer 46 (*) >90 mL/min  CBC     Status: Abnormal   Collection Time   05/05/12  6:55 AM      Component Value Range   WBC 10.0  4.0 - 10.5 K/uL   RBC 3.05 (*) 4.22 - 5.81 MIL/uL   Hemoglobin 9.2 (*) 13.0 - 17.0 g/dL   HCT 04.5 (*) 40.9 - 81.1 %   MCV 90.8  78.0 - 100.0 fL   MCH 30.2  26.0 - 34.0 pg   MCHC 33.2  30.0 - 36.0 g/dL   RDW 91.4  78.2 - 95.6 %   Platelets 180  150 - 400 K/uL  MAGNESIUM     Status: Normal   Collection Time   05/05/12  6:55 AM      Component Value Range   Magnesium 1.9  1.5 - 2.5 mg/dL    Intake/Output Summary (Last 24 hours) at 05/05/12 0816 Last data filed at 05/05/12 2130  Gross per 24 hour  Intake 741.57 ml  Output   1125 ml  Net -383.43 ml    ASSESSMENT AND PLAN:  Acute on chronic systolic and diastolic HF:  Acute decline.  Currently on a low dose of milrinone.  Creat is improved. Continue low dose of Milrinone over the weekend.  Continue current oral meds as long as BP allows.      Bacteremia:  Speciation pending.  Source questionably pulmonary.  No suggestion of endocarditis on echo.  We will follow with  you.    Rollene Rotunda 05/05/2012 8:16 AM

## 2012-05-05 NOTE — Progress Notes (Signed)
MD ordered orthostatic vital signs; patient's orthostatics were positive, MD notified; will continue to monitor patient. Lorretta Harp RN

## 2012-05-05 NOTE — Progress Notes (Signed)
Family Medicine Teaching Service Daily Progress Note Intern Pager: [phone]  Patient name: Andrew [name] Medical record number: [id number] Date of birth: 21-Jan-1931 Age: 79 y.o. Gender: male  Primary Care Provider: Lucilla Edin, MD  Subjective: Pt seen at bedside. No new complaints; breathing is "okay" (comfortable on RA but slightly winded), still feels weak but "stronger than before." No chest pain, fevers, N/V, abd pain. Anxious to go home hopefully soon, but also anxious to "get well first."  Objective: Temp:  [98 F (36.7 C)-99.7 F (37.6 C)] 98 F (36.7 C) (01/31 0622) Pulse Rate:  [75-86] 86  (01/31 0622) Resp:  [18-20] 18  (01/31 0622) BP: (121-129)/(48-56) 129/56 mmHg (01/31 0622) SpO2:  [94 %-100 %] 94 % (01/31 0935) Weight:  [186 lb 4.6 oz (84.5 kg)] 186 lb 4.6 oz (84.5 kg) (01/31 0622) Exam: General: elderly man, lying in bed, in NAD, speaking in full sentences; intermittently on 2L [place] Cardiovascular: RRR, no murmur appreciated Respiratory: soft basilar crackles, diminished breath sounds throughout but good air movement Abdomen: soft, nontender, BS+ Extremities: distal pulses 2+/symmetric, warm/well-perfused, no new/worse LE edema  Laboratory:  Lab 05/05/12 0655 05/04/12 0635 05/03/12 2245  WBC 10.0 13.0* 16.3*  HGB 9.2* 9.7* 9.8*  HCT 27.7* 30.3* 29.8*  PLT 180 194 215    Lab 05/05/12 0655 05/04/12 0635 05/03/12 2245  NA 132* 135 137  K 3.6 3.5 4.0  CL 98 100 103  CO2 25 25 23   BUN 31* 31* 27*  CREATININE 1.58* 1.83* 1.61*  CALCIUM 8.5 8.4 8.6  PROT -- 6.6 --  BILITOT -- 0.5 --  ALKPHOS -- 60 --  ALT -- 9 --  AST -- 18 --  GLUCOSE 97 130* 132*   Cardiac Panel (last 3 results)  Basename 05/04/12 1221 05/04/12 0635 05/04/12 0154  CKTOTAL -- -- --  CKMB -- -- --  TROPONINI <0.30 <0.30 <0.30  RELINDX -- -- --   Micro:   Blood cultures, 1/29 @2356  and 2358: Gram (+) cocci in chains; speciation/susceptibilities pending  Lactic acid, 1/29 @2356 :  1.3    05/04/2012 01:09   Color, Urine  YELLOW   APPearance  CLEAR   Specific Gravity, Urine  1.013   pH  5.5   Glucose  NEGATIVE   Bilirubin Urine  NEGATIVE   Ketones, ur  NEGATIVE   Protein  100 (A)   Urobilinogen, UA  0.2   Nitrite  NEGATIVE   Leukocytes, UA  NEGATIVE   Hgb urine dipstick  NEGATIVE   WBC, UA  0-2   RBC / HPF  3-6   Squamous Epithelial / LPF  RARE   Bacteria, UA  RARE   Casts  HYALINE CASTS (A)    Imaging/Diagnostic Tests: CXR, 1/29 @2256   Findings: Cardiomegaly. Central vascular congestion. Left chest  wall battery pack with unchanged lead tip positions. Increased  interstitial markings and peribronchial thickening. Mild right  greater than left apical scarring/pleural thickening and small  right upper lobe bulla. Otherwise, no confluent airspace opacity.  No pleural effusion or pneumothorax. No acute osseous finding.  IMPRESSION:  Increased interstitial markings and peribronchial thickening may be  chronic or acute on chronic; as can be seen with edema or  bronchitis.  Assessment and Plan: Andrew [name] is a 79 y.o. year old male with PMH significant for ischemic CHF with reduced EF (15% in 02/2011) with biventricular pacer, CAD s/p multiple MI (multiple stenting, remote hx of CABG), hypertension, paroxysmal a-fib/a-flutter presenting with acutely  worsening fatigue and weakness for the past few days and gradually for the past few weeks. Pt febrile in the ED to 101. CXR consistent with chronic/acute-on-chronic increased pulmonary congestion/edema and possible RML early infiltrate; with elevated WBC 16.3, fever, and BNP 10.8k, concern for CAP (meeting sepsis criteria on admission; tachypnea, temp, elevated WB with possible source) with superimposed CHF exacerbation. Admitted 1/31 early AM.  #CAP/Sepsis - Presumptive given slightly increased O2 demand (pt uses 2L [place] at night, at home), increased WBC, and appearance of CXR. No recent hospitalization to suggest  HCAP. -sepsis markers resolved/resolving as of 1/30 -Rocephin and azithromycin initiated IV in the ED, will continue and transition to PO as appropriate  -continue O2 by  as needed; nebulizers scheduled and PRN  -blood cultures positive for Gram (+) cocci in chains; ?pulmonary source; will follow-up speciation -urine Strep pneumo and Legionella antigens both negative  #CHF with reduced EF (15%)/CAD/paroxysmal a-fib/flutter - ischemic cardiomyopathy s/p multiple MI's with stents and remote hx of CABG; biventricular pacer in place, . Pt followed by [facility] (last seen 1/27 by NP; followed by Drs. Hochrein and Graciela Husbands). Troponin negative x1 in the ED, 2 following also negative. S/p Lasix 40 mg IV in the ED. Home Lasix reported as 40 mg daily. Here was initially on Lasix 60 mg IV BID, based on DOSE trial -Dr. Antoine Poche consulted, recommendations appreciated  -likely low-output failure, pt now on low-dose milrinone to continue through weekend  -restarted PO Lasix, hydralazine; continuing Cozaar, Coreg; plan to start nitrates  -repeat echo shows no endocarditis to explain bacteremia; EF 20%  -otherwise daily weights, strict I&O, monitor renal function; continue telemetry monitoring  -continue Xarelto; dose adjusted for renal function; does not report daily ASA at home  -pt has previously been on several statin agents but has stopped them due to muscle aches in the past  -discussed with [facility]  -plan to discuss with PCP and Dr. Antoine Poche, likely on an outpt basis  -pt also counseled on low-cholesterol foods, etc; will need f/u -consult to care management for home heart failure screen    #HTN - BP with some fluctuation, 130's/40's, 140's/60's, 120's/40's  -recent adjustment to hydralazine dose at home, but pt seems to manage dose on his own  -positive orthostatis on 1/31; will continue to manage fluid status and BP carefully -medications per cardiology recommendations, as  above  #Chronic anemia - ?related to kidney disease/chronic illness -Hb stable around baseline 9, here -anemia panel pending  #Chronic arthritic pain/anxiety - No new/worse complaints  -ordered for Norco 5-235 q6 and Xanax 0.25 daily PRN (home doses)   #BPH - On Flomax, continue. No current new/worse complaints.   #GERD - On Nexium at home, 40 mg BID  -substitute Protonix while admitted   FEN/GI: heart-healthy diet, saline lock IV  Prophylaxis: Therapeutic anticoagulation (Xarelto --> dose reduced for renal) and PPI (Protonix for home Nexium)  Disposition: Management as above. Anticipate discharge home to self-care with clinical improvement.  Code Status: Full code  Shakila Mak, [place], MD 05/05/2012, 9:44 AM

## 2012-05-05 NOTE — Progress Notes (Signed)
FMTS Attending Daily Note:  Renold Don MD  [phone] pager  Family Practice pager:  [phone] I have seen and examined this patient and have reviewed their chart. I have discussed this patient with the resident. I agree with the resident's findings, assessment and care plan.  Additionally: - Pt still somewhat fatigued.  Cough persists, but states "not as bad as it is sometimes."   - Vitals reveal orthostasis after testing earlier today.   - Lung exam reveals poor air movement.  No crackles.  Diffuse wheezing noted BL.  Heart distant, RRR. - I note COPD in his problem list.  Not classified, but I would classify as severe based on his symptom history.  Almost 60 pack year history.  Uses Nebulizer TID scheduled with prn albuterol inhaler usage on daily basis.  Seems to have all the signs and symptoms of COPD exacerbation brought on by PNA.  Adding Spiriva, scheduled nebulizer treatments as he has been receiving infrequenty bronchodilator usage, adding 5 day burst of Prednisone. - Favor PNA as source of bacteremia.  I do note bacteremia, believe that corticosteroid should outweigh risks.  If any signs of acute worsening, can hold this.   - Awaiting speciation for cultures.  Will make changes as appropriate.  Ceftriaxone should cover strep species.

## 2012-05-06 DIAGNOSIS — I2589 Other forms of chronic ischemic heart disease: Secondary | ICD-10-CM

## 2012-05-06 DIAGNOSIS — J189 Pneumonia, unspecified organism: Secondary | ICD-10-CM

## 2012-05-06 DIAGNOSIS — A419 Sepsis, unspecified organism: Secondary | ICD-10-CM

## 2012-05-06 DIAGNOSIS — I4891 Unspecified atrial fibrillation: Secondary | ICD-10-CM

## 2012-05-06 DIAGNOSIS — J441 Chronic obstructive pulmonary disease with (acute) exacerbation: Secondary | ICD-10-CM | POA: Diagnosis present

## 2012-05-06 DIAGNOSIS — Z950 Presence of cardiac pacemaker: Secondary | ICD-10-CM

## 2012-05-06 LAB — BASIC METABOLIC PANEL
BUN: 35 mg/dL — ABNORMAL HIGH (ref 6–23)
Calcium: 8.4 mg/dL (ref 8.4–10.5)
Creatinine, Ser: 1.58 mg/dL — ABNORMAL HIGH (ref 0.50–1.35)
GFR calc Af Amer: 46 mL/min — ABNORMAL LOW (ref >90)
GFR calc non Af Amer: 40 mL/min — ABNORMAL LOW (ref >90)

## 2012-05-06 MED ORDER — MOMETASONE FURO-FORMOTEROL FUM 100-5 MCG/ACT IN AERO
2.0000 | INHALATION_SPRAY | Freq: Two times a day (BID) | RESPIRATORY_TRACT | Status: DC
  Administered 2012-05-06 – 2012-05-10 (×8): 2 via RESPIRATORY_TRACT
  Filled 2012-05-06: qty 8.8

## 2012-05-06 MED ORDER — DEXTROSE 5 % IV SOLN
2.0000 g | Freq: Two times a day (BID) | INTRAVENOUS | Status: DC
  Administered 2012-05-06 – 2012-05-10 (×8): 2 g via INTRAVENOUS
  Filled 2012-05-06 (×9): qty 2

## 2012-05-06 MED ORDER — FERUMOXYTOL INJECTION 510 MG/17 ML
510.0000 mg | Freq: Once | INTRAVENOUS | Status: AC
  Administered 2012-05-06: 510 mg via INTRAVENOUS
  Filled 2012-05-06: qty 17

## 2012-05-06 MED ORDER — PIPERACILLIN-TAZOBACTAM 3.375 G IVPB
3.3750 g | Freq: Three times a day (TID) | INTRAVENOUS | Status: DC
  Administered 2012-05-06: 3.375 g via INTRAVENOUS
  Filled 2012-05-06 (×3): qty 50

## 2012-05-06 MED ORDER — AMPICILLIN SODIUM 2 G IJ SOLR
2.0000 g | INTRAMUSCULAR | Status: DC
  Administered 2012-05-06 – 2012-05-10 (×22): 2 g via INTRAVENOUS
  Filled 2012-05-06 (×30): qty 2000

## 2012-05-06 MED ORDER — AMPICILLIN 250 MG/5ML PO SUSR: 1000.0000 mg | Freq: Four times a day (QID) | ORAL | Status: DC

## 2012-05-06 MED ORDER — FERROUS SULFATE 325 (65 FE) MG PO TABS
325.0000 mg | ORAL_TABLET | Freq: Every day | ORAL | Status: DC
  Administered 2012-05-07: 325 mg via ORAL
  Filled 2012-05-06 (×2): qty 1

## 2012-05-06 NOTE — Progress Notes (Signed)
Family Medicine Teaching Service Daily Progress Note Intern Pager: [phone]  Patient name: Andrew [name] Medical record number: [id number] Date of birth: Oct 01, 1930 Age: 79 y.o. Gender: male  Primary Care Provider: Lucilla Edin, MD  Subjective: Pt seen at bedside. No new complaints. No chest pain, fevers, N/V, abd pain. States he "feels much better," no longer weak and breathing well. Remains anxious to go home.  Objective: Temp:  [97.4 F (36.3 C)-98.5 F (36.9 C)] 97.8 F (36.6 C) (02/01 0628) Pulse Rate:  [73-82] 73  (02/01 0628) Resp:  [18-20] 18  (02/01 0300) BP: (69-144)/(33-69) 144/61 mmHg (02/01 0628) SpO2:  [92 %-99 %] 97 % (02/01 0823) Weight:  [190 lb 0.6 oz (86.2 kg)] 190 lb 0.6 oz (86.2 kg) (02/01 0300) Exam: General: elderly man, sitting up at bedside in NAD, speaking in full sentences, very comfortable on RA Cardiovascular: RRR, no murmur appreciated Respiratory: diminished breath sounds throughout but good air movement, no frank crackles this morning Abdomen: soft, nontender, BS+ Extremities: distal pulses 2+/symmetric, warm/well-perfused, no new/worse LE edema  Laboratory:  Lab 05/05/12 0655 05/04/12 0635 05/03/12 2245  WBC 10.0 13.0* 16.3*  HGB 9.2* 9.7* 9.8*  HCT 27.7* 30.3* 29.8*  PLT 180 194 215    Lab 05/06/12 0615 05/05/12 0655 05/04/12 0635  NA 133* 132* 135  K 3.8 3.6 3.5  CL 99 98 100  CO2 25 25 25   BUN 35* 31* 31*  CREATININE 1.58* 1.58* 1.83*  CALCIUM 8.4 8.5 8.4  PROT -- -- 6.6  BILITOT -- -- 0.5  ALKPHOS -- -- 60  ALT -- -- 9  AST -- -- 18  GLUCOSE 98 97 130*   Cardiac Panel (last 3 results)  Basename 05/04/12 1221 05/04/12 0635 05/04/12 0154  CKTOTAL -- -- --  CKMB -- -- --  TROPONINI <0.30 <0.30 <0.30  RELINDX -- -- --   Micro:   Blood cultures, 1/29 @2356  and 2358: Gram (+) cocci in chains; speciation/susceptibilities pending  Lactic acid, 1/29 @2356 : 1.3    05/04/2012 01:09   Color, Urine  YELLOW   APPearance  CLEAR    Specific Gravity, Urine  1.013   pH  5.5   Glucose  NEGATIVE   Bilirubin Urine  NEGATIVE   Ketones, ur  NEGATIVE   Protein  100 (A)   Urobilinogen, UA  0.2   Nitrite  NEGATIVE   Leukocytes, UA  NEGATIVE   Hgb urine dipstick  NEGATIVE   WBC, UA  0-2   RBC / HPF  3-6   Squamous Epithelial / LPF  RARE   Bacteria, UA  RARE   Casts  HYALINE CASTS (A)    Imaging/Diagnostic Tests: CXR, 1/29 @2256   Findings: Cardiomegaly. Central vascular congestion. Left chest  wall battery pack with unchanged lead tip positions. Increased  interstitial markings and peribronchial thickening. Mild right  greater than left apical scarring/pleural thickening and small  right upper lobe bulla. Otherwise, no confluent airspace opacity.  No pleural effusion or pneumothorax. No acute osseous finding.  IMPRESSION:  Increased interstitial markings and peribronchial thickening may be  chronic or acute on chronic; as can be seen with edema or  bronchitis.  Assessment and Plan: Andrew [name] is a 79 y.o. year old male with PMH significant for ischemic CHF with reduced EF (15% in 02/2011) with biventricular pacer, CAD s/p multiple MI (multiple stenting, remote hx of CABG), hypertension, paroxysmal a-fib/a-flutter presenting with acutely worsening fatigue and weakness for the past few days and  gradually for the past few weeks. Pt febrile in the ED to 101. CXR consistent with chronic/acute-on-chronic increased pulmonary congestion/edema and possible RML early infiltrate; with elevated WBC 16.3, fever, and BNP 10.8k, concern for CAP (meeting sepsis criteria on admission; tachypnea, temp, elevated WB with possible source) with superimposed CHF exacerbation. Admitted 1/31 early AM.  #CAP/Sepsis - Presumptive given slightly increased O2 demand (pt uses 2L [place] at night, at home), increased WBC, and appearance of CXR. No recent hospitalization to suggest HCAP. Urine Strep pneumo and Legionella antigens both negative -sepsis  markers resolved/resolving as of 1/30 -Rocephin and azithromycin initiated IV in the ED  -blood cultures positive for Gram (+) cocci in chains; ?pulmonary source; will follow-up speciation  -will continue and transition to PO as appropriate; likely will treat with appropriate PO for total 10 days -continue O2 by [place] as needed; nebulizers scheduled and PRN   #COPD - fairly severe but not formally classified; pt reports nearly 60 pack-year smoking history (quit 4 years ago) -pt uses scheduled nebulizers at home; scheduled and PRN nebulizer treatments ordered here -prednisone added for 5 day burst (1/31 was day 1) -Spiriva initially added 1/31; pt reports he uses Advair at home and Spiriva doesn't work --> substitute Dulera -otherwise continue abx as above  #CHF with reduced EF (15% in 2012)/CAD/paroxysmal a-fib/flutter - ischemic cardiomyopathy s/p multiple MI's with stents and remote hx of CABG; biventricular pacer in place, . Pt followed by [name] (last seen 1/27 by NP; followed by Drs. Andrew [name] and Andrew [name]). Troponin negative x1 in the ED, 2 following also negative. S/p Lasix 40 mg IV in the ED. Home Lasix reported as 40 mg daily. Here was initially on Lasix 60 mg IV BID, based on DOSE trial -Dr. Antoine [name] consulted, recommendations appreciated  -likely low-output failure, pt now on low-dose milrinone to continue through weekend  -restarted PO Lasix, hydralazine; continuing Cozaar, Coreg; possibly plan to start nitrates  -repeat echo shows no endocarditis to explain bacteremia; EF 20%  -otherwise daily weights, strict I&O, monitor renal function; continue telemetry monitoring  -continue Xarelto, dose adjusted for renal function; does not report daily ASA at home  -pt has previously been on several statin agents but has stopped them due to muscle aches in the past  -discussed with [name] PA on 1/31  -plan to discuss with PCP and Dr. Antoine [name], likely on an outpt basis  -pt  also counseled on low-cholesterol foods, etc; will need f/u -consult to care management for home heart failure screen    #HTN - BP with some fluctuation, 130's/40's, 140's/60's, 120's/40's  -recent adjustment to hydralazine dose at home, but pt seems to ?manage dose on his own  -positive orthostatis on 1/31; will continue to manage fluid status and BP carefully -BP's slightly more elevated 2/1; continue medications per cardiology recommendations, as above  #Chronic anemia - ?related to kidney disease/chronic illness -Hb stable around baseline 9, here; will monitor intermittently as needed -anemia panel shows iron <10; reticulocyte count also not as elevated as would be expected -will administer Feraheme x1 and start oral Fe  #Chronic arthritic pain/anxiety - No new/worse complaints  -ordered for Norco 5-235 q6 and Xanax 0.25 daily PRN (home doses)   #BPH - On Flomax, continue. No current new/worse complaints.   #GERD - On Nexium at home, 40 mg BID  -substitute Protonix while admitted   FEN/GI: heart-healthy diet, saline lock IV  Prophylaxis: Therapeutic anticoagulation (Xarelto --> dose reduced for renal) and PPI (Protonix for  home Nexium)  Disposition: Management as above. Anticipate discharge home to self-care with clinical improvement.  Code Status: Full code  Savalas Monje, [place], MD 05/06/2012, 9:54 AM

## 2012-05-06 NOTE — Progress Notes (Signed)
   SUBJECTIVE:  Feels much better at this time.  States he is near baseline.  Denies CP or SOB.  NO further fevers or chills.  PHYSICAL EXAM Physical Exam: Filed Vitals:   05/06/12 0122 05/06/12 0300 05/06/12 0628 05/06/12 0823  BP:  117/59 144/61   Pulse:  73 73   Temp:  97.4 F (36.3 C) 97.8 F (36.6 C)   TempSrc:  Oral    Resp:  18    Height:      Weight:  190 lb 0.6 oz (86.2 kg)    SpO2: 98% 98% 98% 97%    GEN- The patient is well appearing, alert and oriented x 3 today.   Head- normocephalic, atraumatic Eyes-  Sclera clear, conjunctiva pink Ears- hearing intact Oropharynx- clear Neck- supple, Lungs- bibasilar rales, normal work of breathing Chest- pacemaker pocket is well healed Heart- Regular rate and rhythm GI- soft, NT, ND, + BS Extremities- no clubbing, cyanosis, or edema MS- no significant deformity or atrophy Skin- no rash or lesion Psych- euthymic mood, full affect Neuro- strength and sensation are intact  LABS:  Results for orders placed during the hospital encounter of 05/03/12 (from the past 24 hour(s))  BASIC METABOLIC PANEL     Status: Abnormal   Collection Time   05/06/12  6:15 AM      Component Value Range   Sodium 133 (*) 135 - 145 mEq/L   Potassium 3.8  3.5 - 5.1 mEq/L   Chloride 99  96 - 112 mEq/L   CO2 25  19 - 32 mEq/L   Glucose, Bld 98  70 - 99 mg/dL   BUN 35 (*) 6 - 23 mg/dL   Creatinine, Ser 1.61 (*) 0.50 - 1.35 mg/dL   Calcium 8.4  8.4 - 09.6 mg/dL   GFR calc non Af Amer 40 (*) >90 mL/min   GFR calc Af Amer 46 (*) >90 mL/min    Intake/Output Summary (Last 24 hours) at 05/06/12 1213 Last data filed at 05/06/12 1000  Gross per 24 hour  Intake 1481.48 ml  Output   1950 ml  Net -468.52 ml    ASSESSMENT AND PLAN: 1. Bacteremia Pt with blood cultures now growing enterococcus.  This is the likely cause of his presenting symptoms and is worrisome, particularly with a pacing system implanted. Dr Gwendolyn Grant and I have spoken and agree that  ID consultation would be beneficial to better define antibiotic regimen and coarse going forward.  I would anticipate that he will require TEE and possibly pacing system removal, though it would be nice to avoid the latter if possible.  2. Acute on chronic systolic and diastolic HF/ ischemic CM:  stable.  Currently on a low dose of milrinone for today and stop if clinically stable in the am.  Continue current oral meds as long as BP allows.      3. afib- on xarelto  4. S/p BiV pacemaker  As above  We will follow with you.    Hillis Range 05/06/2012 12:13 PM

## 2012-05-06 NOTE — Plan of Care (Signed)
Problem: Phase I Progression Outcomes Goal: Dyspnea controlled at rest (HF) Outcome: Completed/Met Date Met:  05/06/12 Pt has not had any c/o SOB while at rest, goal met Goal: Up in chair, BRP Outcome: Completed/Met Date Met:  05/06/12 Pt oob with supervision, pt ambulated in hall way tolerated well, goal met

## 2012-05-06 NOTE — Discharge Summary (Addendum)
Family Medicine Teaching [facility] Discharge Summary  Patient name: Andrew [name] Medical record number: [id number] Date of birth: November 23, 1930 Age: 79 y.o. Gender: male Date of Admission: 05/03/2012  Date of Discharge: 05/10/12 Admitting Physician: Sanjuana Letters, MD  Primary Care Provider: Lucilla Edin, MD  Indication for Hospitalization: weakness, fatigue, fever with elevated WBC Discharge Diagnoses:  CAP with sepsis and bacteremia COPD with acute exacerbation Chronic systolic heart failure, acute-on-chronic exacerbation CAD, ischemic cardiomyopathy, s/p biventricular pacemaker HTN HLD Unspecified anemia  Consultations: ID, cardiology  Significant Labs and Imaging:  CXR 05/09/12 Interval right-sided central line placement, probably a PICC. Tip  overlies the level of superior vena cava.  CXR 05/05/12 Increased interstitial markings and peribronchial thickening may be  chronic or acute on chronic; as can be seen with edema or  bronchitis.  Procedures:  2D Echocardiogram, 1/30 @1031  Study Conclusions - Left ventricle: Difficult acoustic windows. LVEF is approximately 20% with akineis of the anterior, prox/distal inferior and posterior, md/distal lateral and apical walls. The cavity size was normal. Features are consistent with a pseudonormal left ventricular filling pattern, with concomitant abnormal relaxation and increased filling pressure (grade 2 diastolic dysfunction). - Left atrium: The atrium was mildly dilated.  TEE 05/08/12: The 3 pacing wires were seen fairly well. There were no vegetations seen on the pacing wires. No TEE evidence of endocarditis seen.    Brief Hospital Course: Andrew [name] is a 79 y.o. year old male with PMH significant for ischemic CHF with reduced EF (15% in 02/2011) with biventricular pacer, CAD s/p multiple MI (multiple stenting, remote hx of CABG), hypertension, paroxysmal a-fib/a-flutter presenting with acutely worsening  fatigue and weakness for the past few days and gradually for the past few weeks. Pt febrile in the ED to 101, CXR consistent with chronic/acute-on-chronic increased pulmonary congestion/edema and possible RML early infiltrate; with elevated WBC 16.3, fever, and BNP 10.8k, concern for CAP (meeting sepsis criteria on admission; tachypnea, temp, elevated WB with possible source) with superimposed CHF exacerbation. Admitted 1/31 early AM.   #CAP/Sepsis - Presumptive given slightly increased O2 demand (pt uses 2L [place] at night, at home), increased WBC, and appearance of CXR. No recent hospitalization to suggest HCAP. Urine Strep pneumo and Legionella antigens both negative  -sepsis markers resolved/resolving as of 1/30   -Rocephin and azithromycin initiated IV in the ED   -blood cultures positive for Gram (+) cocci in chains, enterococcus   -patient seen by ID and started on course of ceftriaxone and ampicillin for 6 weeks of treatment  -PICC line placed for administration of 6 weeks of antibiotics after repeat BCx negative x48 hours  -was continued on O2 by [place] as needed and weaned to 2L at night which he uses at home, additionally    continued home scheduled nebulizer treatments  -given pacemaker patient received TEE for evaluation of leads and for vegetations-this was normal    #COPD - fairly severe but not formally classified; pt reports nearly 60 pack-year smoking history (quit 4 years ago)  -pt uses scheduled nebulizers at home; scheduled and PRN nebulizer treatments ordered here  -prednisone added for 5 day burst (1/31 was day 1); pt reports he uses Advair at home and Spiriva doesn't work --> substituted Dulera while admitted -otherwise abx as above   #CHF with reduced EF (15% in 2012)/CAD/paroxysmal a-fib/flutter - ischemic cardiomyopathy s/p multiple MI's with stents and remote hx of CABG; biventricular pacer in place. Pt followed by [facility] (last seen 1/27 by  NP; followed by Drs.  Hochrein and Graciela Husbands). Troponin negative x1 in the ED, 2 following also negative. S/p Lasix 40 mg IV in the ED. Home Lasix reported as 40 mg daily. Here was initially on Lasix 60 mg IV BID, based on DOSE trial  -Dr. Antoine Poche consulted, recommendations appreciated   -likely low-output failure, pt started on low-dose milrinone and this was discontinued as patient     obtained improved volume status   -continued PO Lasix, hydralazine; continuing Cozaar, Coreg  -repeat echo shows no endocarditis to explain bacteremia; EF 20%  -otherwise daily weights, strict I&O, monitor renal function; continue telemetry monitoring  -continue Xarelto, dose adjusted for renal function, once renal function returned to normal was restarted on full dose; does not report daily ASA at home  -pt has previously been on several statin agents but has stopped them due to muscle aches in the past   -discussed with [facility] PA on 1/31   -plan to discuss with PCP and Dr. Antoine Poche, likely on an outpt basis   -pt also counseled on low-cholesterol foods, etc; will need f/u  -consult to care management for home heart failure screen   #HTN - BP with some fluctuation, 130's/40's, 140's/60's, 120's/40's  -recent adjustment to hydralazine dose at home, but pt seems to possibly manage dose on his own  -positive orthostatics on 1/31; will continue to manage fluid status and BP carefully  -BP's slightly more elevated 2/1; continue medications per cardiology recommendations, as above  -BP stable at time of discharge  #Chronic anemia - ?related to kidney disease/chronic illness  -Hb stable around baseline 9, here; remained stable throughout hospitalization  -anemia panel shows iron <10, but reticulocyte count not elevated as would be expected -s/p Feraheme x1 on 2/1, added oral Fe which was discontinued given positive blood cultures  #Chronic arthritic pain/anxiety - No new/worse complaints  -ordered for Norco 5-235 q6 and Xanax  0.25 daily PRN (home doses)   #BPH - On Flomax, continued. No current new/worse complaints.   #GERD - On Nexium at home, 40 mg BID  -substituted Protonix while admitted   Discharge Medications:    Medication List     As of 05/12/2012  5:54 PM    STOP taking these medications         prenatal multivitamin Tabs      TAKE these medications         ALPRAZolam 0.25 MG tablet   Commonly known as: XANAX   Take 0.25 mg by mouth daily as needed. For anxiety.      carvedilol 25 MG tablet   Commonly known as: COREG   Take 25 mg by mouth 2 (two) times daily.      dextrose 5 % SOLN 50 mL with cefTRIAXone 2 G SOLR 2 g   Inject 2 g into the vein every 12 (twelve) hours.      esomeprazole 40 MG capsule   Commonly known as: NEXIUM   Take 40 mg by mouth 2 (two) times daily.      fluticasone 50 MCG/ACT nasal spray   Commonly known as: FLONASE   Place 1 spray into both nostrils daily.      furosemide 40 MG tablet   Commonly known as: LASIX   Take 40-80 mg by mouth daily. Takes 1 tablet everyday; then based on weight gain from previous day, if >2 lbs. Take 1 additional tablet as needed.      hydrALAZINE 25 MG tablet   Commonly  known as: APRESOLINE   Take 25 mg by mouth 3 (three) times daily.      HYDROcodone-acetaminophen 5-500 MG per tablet   Commonly known as: VICODIN   Take 1 tablet by mouth every 6 (six) hours as needed. For pain.      losartan 50 MG tablet   Commonly known as: COZAAR   Take 50 mg by mouth 2 (two) times daily.      mometasone-formoterol 100-5 MCG/ACT Aero   Commonly known as: DULERA   Inhale 2 puffs into the lungs 2 (two) times daily.      nitroGLYCERIN 0.4 MG SL tablet   Commonly known as: NITROSTAT   Place 1 tablet (0.4 mg total) under the tongue every 5 (five) minutes as needed for chest pain.      potassium chloride 10 MEQ tablet   Commonly known as: K-DUR   Take 10 mEq by mouth daily as needed. For potassium level regulation, take potassium as  needed when taking both lasix tablets.      PROAIR HFA IN   Inhale 2 puffs into the lungs every 8 (eight) hours as needed. Shortness of breath.      albuterol (2.5 MG/3ML) 0.083% NEBU 3 mL, albuterol (5 MG/ML) 0.5% NEBU 0.5 mL   Inhale 2.5 mg into the lungs 3 (three) times daily as needed. For shortness of breath.      Rivaroxaban 20 MG Tabs   Commonly known as: XARELTO   Take 20 mg by mouth daily.      sodium chloride 0.9 % SOLN 50 mL with ampicillin 2 G SOLR 2 g   Inject 2 g into the vein every 4 (four) hours.      Tamsulosin HCl 0.4 MG Caps   Commonly known as: FLOMAX   Take 0.4 mg by mouth 2 (two) times daily.        Disposition: discharge home  Issues for Follow Up:  1. Cardiology - Extensive hx, as above. Will need follow-up with cardiology to ensure patients pace maker is not a source of infection. 2. Chronic anemia - S/p Feraheme x1 on 2/1. Likely related to chronic illnesses, as well. No obvious bleeding. Will need further work-up of cause and additional supplementation of iron once acute infection is treated 3. COPD - will need to resume home medications and follow-up continued treatment. 4. ID: will need follow-up to ensure adequate treatment of infection with 6 weeks IV antibiotics 5. Consider addition of a statin.   Outstanding Results: repeat BCx  Discharge Instructions: Please refer to Patient Instructions section of EMR for full details.  Patient was counseled important signs and symptoms that should prompt return to medical care, changes in medications, dietary instructions, activity restrictions, and follow up appointments.    Discharge Condition: improved Marikay Alar, MD 05/12/2012, 5:59 PM  Seen and examined on the day of discharge.  Agree with documentation and management of Dr. Birdie Sons.

## 2012-05-06 NOTE — Progress Notes (Signed)
FMTS Attending Daily Note:  Andrew Don MD  [phone] pager  Family Practice pager:  [phone] I have seen and examined this patient and have reviewed their chart. I have discussed this patient with the resident. I agree with the resident's findings, assessment and care plan.  Additionally: - Patient improved today.  Fatigue has resolved.  Lung exam with better aeration throughout.  - On Advair at home, he tells me today.  Will add Dulera to medication list here.  Stop Spiriva. Otherwise continue as per COPD exacerbation - Agree with oral iron, especially in light of heart failure and anemia.  My only concern is that he does not have appropriate reticulocytosis for such low iron.  May warrant heme consult, can do this on outpt basis.  - Appreciate cardiology input.  Question is how long to remain on Milrinone?   - Awaiting speciation for blood cultures.  Can narrow antibiotics at that point.

## 2012-05-06 NOTE — Progress Notes (Signed)
ANTIBIOTIC CONSULT NOTE - INITIAL  Pharmacy Consult for Zosyn Indication: enterococcal bacteremia  Allergies  Allergen Reactions  . Codeine Nausea And Vomiting  . Inspra (Eplerenone) Other (See Comments)    Stomach problems  . Spironolactone Other (See Comments)    Gynecomastia  . Doxycycline Itching and Rash  . Sulfonamide Derivatives Itching and Rash    Patient Measurements: Height: 6' (182.9 cm) Weight: 190 lb 0.6 oz (86.2 kg) IBW/kg (Calculated) : 77.6   Vital Signs: Temp: 97.8 F (36.6 C) (02/01 0628) Temp src: Oral (02/01 0300) BP: 144/61 mmHg (02/01 0628) Pulse Rate: 73  (02/01 0628) Intake/Output from previous day: 01/31 0701 - 02/01 0700 In: 1721.5 [P.O.:1200; I.V.:221.5; IV Piggyback:300] Out: 1650 [Urine:1650] Intake/Output from this shift: Total I/O In: 240 [P.O.:240] Out: 300 [Urine:300]  Labs:  [facility] 05/06/12 0615 05/05/12 0655 05/04/12 0635 05/03/12 2245  WBC -- 10.0 13.0* 16.3*  HGB -- 9.2* 9.7* 9.8*  PLT -- 180 194 215  LABCREA -- -- -- --  CREATININE 1.58* 1.58* 1.83* --   Estimated Creatinine Clearance: 41.6 ml/min (by C-G formula based on Cr of 1.58). No results found for this basename: VANCOTROUGH:2,VANCOPEAK:2,VANCORANDOM:2,GENTTROUGH:2,GENTPEAK:2,GENTRANDOM:2,TOBRATROUGH:2,TOBRAPEAK:2,TOBRARND:2,AMIKACINPEAK:2,AMIKACINTROU:2,AMIKACIN:2, in the last 72 hours   Microbiology: Recent Results (from the past 720 hour(s))  CULTURE, BLOOD (ROUTINE X 2)     Status: Normal (Preliminary result)   Collection Time   05/03/12 11:56 PM      Component Value Range Status Comment   Specimen Description BLOOD LEFT ARM   Final    Special Requests BOTTLES DRAWN AEROBIC AND ANAEROBIC 8CC   Final    Culture  Setup Time 05/04/2012 10:03   Final    Culture     Final    Value: ENTEROCOCCUS SPECIES     Note: Gram Stain Report Called to,Read Back By and Verified With: ALICIA WALKER ON 05/04/2012 AT 11:12P BY WILEJ   Report Status PENDING   Incomplete    CULTURE, BLOOD (ROUTINE X 2)     Status: Normal (Preliminary result)   Collection Time   05/03/12 11:58 PM      Component Value Range Status Comment   Specimen Description BLOOD LEFT ARM   Final    Special Requests BOTTLES DRAWN AEROBIC AND ANAEROBIC 10CC   Final    Culture  Setup Time 05/04/2012 10:03   Final    Culture     Final    Value: ENTEROCOCCUS SPECIES     Note: Gram Stain Report Called to,Read Back By and Verified With: ALICIA WALKER ON 05/04/2012 AT 11:12P BY WILEJ   Report Status PENDING   Incomplete     Medical History: Past Medical History  Diagnosis Date  . Chronic systolic heart failure     a. Chronic LV systolic failure. b. Echo in Dec 2011 showed EF of 25% - f/u echo 02/2011: Mild LVH, EF 15%, posterior lateral akinesis, inferior akinesis, grade 1 diastolic dysfunction, mild LAE. c. s/p BiV pacemaker 06/2011.  [name] Atrial flutter     a. Off amiodarone because of increased LFTs  . COPD (chronic obstructive pulmonary disease)     Chronic SOB, O2 dependent  . Myocardial infarction 1994  . Gastro - esophageal reflux   . Claudication   . BPH (benign prostatic hyperplasia)   . Coronary artery disease     a. Myocardial infarction in 1984,  99 and 07. b. H/o multiple RCA stents. c.  LHC 04/2011: without obstructive disease. oLM 25%, mLAD 30%, pCFX 25%, prox to mid RCA  stent ok, dRCA 30% and 70%, pPDA 25%, mPL 25-30%, EF 25%.  [name] Hypertension   . Atrial fibrillation     a. On Xarelto.  . Hemorrhoids   . Hyperlipidemia     statin intolerant 2/2 myalgias  . Hx of adenomatous colonic polyps   . Hx of colonoscopy   . Chronic renal insufficiency   . Biventricular cardiac pacemaker in situ 3.2013  . NSVT (nonsustained ventricular tachycardia)     a. 04/2012- 6 beats on tele.  [name] Shortness of breath   . Arthritis     hips ,back ,hands    Assessment: 79 y/o M now found to have enterococcal bacteremia. Changing rocephin/azithromycin to zosyn while awaiting sensitivities. WBC 10,  Scr 1.58 with CrCl ~ 40-68mL/min. Afebrile.   Goal of Therapy:  Eradication of infection  Plan:  -Zosyn 3.375G IV q8h to be infused over 4 hours -F/U C&S for enterococcus -Trend WBC/temp/renal function  Thank you for the consult,   Abran Duke, PharmD Clinical Pharmacist Phone: [phone] Pager: [phone] 05/06/2012 11:52 AM

## 2012-05-06 NOTE — Consult Note (Addendum)
[facility]  Total days of antibiotics 4        Day piptazo 1        Day 3 ceftriaxone, d/c'd 2/1        Day 3 azithromycin, d/c'd 2/1        1 dose of oseltamivir       Reason for Consult:enterococcal bacteremia concern for cardiac device related infection/endocarditits   Referring Physician: walden  Principal Problem:  *Community acquired pneumonia Active Problems:  UNSPECIFIED ANEMIA  COPD UNSPECIFIED  GERD  Chronic systolic heart failure  Hypertension  Ischemic cardiomyopathy  Hyperlipidemia  Coronary atherosclerosis of native coronary artery  S/P biventricular cardiac pacemaker-[employer]  Acute on chronic congestive heart failure  Sepsis  COPD with acute exacerbation    HPI: Andrew [name] is a 79 y.o. male with history of CAD c/b ICM with EF 15% by echo 02/2011 s/p Bi-V pacemaker 06/2011, COPD on 2LNC supp O2, PAF/flutter on Xarelto, & CKD (baseline Cr 1.4) who presented to the ED on 1/29 with complaints of worsening fatigue & malaise, feverish for 2 days and fever of 101.8. [facility] 3 days PTA for CHF check with weight 193 at that time;but now back to baseline. His wife reports that he had sudden onset of fatigue, malaise, myalgia over a course of 2 days. The night of being admitted he was quite disoriented, felt warm to touch, and had sudden onset 3 bouts of loose stools. he had increased SOB but no change in chronic productive cough. He denies orthopnea, PND, significant LE edema. He denies sick contact. On admit, He was having some hypotension, febrile with leukocytosis and  CXR showed increased interstitial markings and peribronchial thickening. His labs revealed WBC 16.3 with 85% N, lactic acid WNL, Cr 1.61, BNP 10883. He was initially diagnosed with CHF +/- CAP. He was started on ceftriaxone, azithromycin and oseltamivir for ILI. His flu screen was negative thus continued on ceftriaxone and azithromycin until 2/1 when his  blood cultures in 2 sets identified enterococcus (sensi pending). The patient has responded to diuretics plus low dose milrinone from heart failure standpoint. He is no longer febrile and wbc has trended down to 10 over the last 2 days. He underwent 2D echo on 1/30 that estimated EF 20%, but poor visualization of valves.  Given that he has enterococcal bacteremia and cardiac device, there is a significant concern for cardiac device related endocarditis   Past Medical History  Diagnosis Date  . Chronic systolic heart failure     a. Chronic LV systolic failure. b. Echo in Dec 2011 showed EF of 25% - f/u echo 02/2011: Mild LVH, EF 15%, posterior lateral akinesis, inferior akinesis, grade 1 diastolic dysfunction, mild LAE. c. s/p BiV pacemaker 06/2011.  [name] Atrial flutter     a. Off amiodarone because of increased LFTs  . COPD (chronic obstructive pulmonary disease)     Chronic SOB, O2 dependent  . Myocardial infarction 1994  . Gastro - esophageal reflux   . Claudication   . BPH (benign prostatic hyperplasia)   . Coronary artery disease     a. Myocardial infarction in 1984,  99 and 07. b. H/o multiple RCA stents. c.  LHC 04/2011: without obstructive disease. oLM 25%, mLAD 30%, pCFX 25%, prox to mid RCA stent ok, dRCA 30% and 70%, pPDA 25%, mPL 25-30%, EF 25%.  [name] Hypertension   . Atrial fibrillation     a.  On Xarelto.  . Hemorrhoids   . Hyperlipidemia     statin intolerant 2/2 myalgias  . Hx of adenomatous colonic polyps   . Hx of colonoscopy   . Chronic renal insufficiency   . Biventricular cardiac pacemaker in situ 3.2013  . NSVT (nonsustained ventricular tachycardia)     a. 04/2012- 6 beats on tele.  [name] Shortness of breath   . Arthritis     hips ,back ,hands    Allergies:  Allergies  Allergen Reactions  . Codeine Nausea And Vomiting  . Inspra (Eplerenone) Other (See Comments)    Stomach problems  . Spironolactone Other (See Comments)    Gynecomastia  . Doxycycline Itching and Rash   . Sulfonamide Derivatives Itching and Rash    Current antibiotics:   MEDICATIONS:    . albuterol  2.5 mg Nebulization Q6H  . carvedilol  25 mg Oral BID WC  . ferrous sulfate  325 mg Oral Q breakfast  . furosemide  40 mg Oral Daily  . hydrALAZINE  10 mg Oral Q8H  . losartan  50 mg Oral BID  . mometasone-formoterol  2 puff Inhalation BID  . pantoprazole  80 mg Oral BID  . piperacillin-tazobactam (ZOSYN)  IV  3.375 g Intravenous Q8H  . rivaroxaban  15 mg Oral Q supper  . sodium chloride  3 mL Intravenous Q12H  . Tamsulosin HCl  0.4 mg Oral BID    History  Substance Use Topics  . Smoking status: Former Smoker -- 1.5 packs/day for 55 years    Quit date: 01/03/2009  . Smokeless tobacco: Never Used  . Alcohol Use: Yes     Comment: occasional    Family History  Problem Relation Age of Onset  . Coronary artery disease Father   . Heart disease Mother   . Colon cancer Neg Hx   . Heart disease Brother      Review of Systems  Constitutional: positive for fever, chills, but negative diaphoresis, activity change, appetite change, fatigue and unexpected weight change.  HENT: positive for rhinorrhea. Negative for congestion, sore throat,  sneezing, trouble swallowing and sinus pressure.  Eyes: Negative for photophobia and visual disturbance.  Respiratory: positive for cough, increased work of breathing & SOB. Negativechest tightness,  wheezing and stridor.  Cardiovascular: Negative for chest pain, palpitations and leg swelling.  Gastrointestinal: Negative for nausea, vomiting, abdominal pain, diarrhea, constipation, blood in stool, abdominal distention and anal bleeding.  Genitourinary: Negative for dysuria, hematuria, flank pain and difficulty urinating.  Musculoskeletal: Negative for myalgias, back pain, joint swelling, arthralgias and gait problem.  Skin: Negative for color change, pallor, rash and wound.  Neurological: Negative for dizziness, tremors, weakness and  light-headedness.  Hematological: Negative for adenopathy. Does not bruise/bleed easily.  Psychiatric/Behavioral: Negative for behavioral problems, confusion, sleep disturbance, dysphoric mood, decreased concentration and agitation.     OBJECTIVE: Temp:  [97.4 F (36.3 C)-98 F (36.7 C)] 97.8 F (36.6 C) (02/01 0628) Pulse Rate:  [73-82] 73  (02/01 0628) Resp:  [18-20] 18  (02/01 0300) BP: (105-144)/(52-69) 144/61 mmHg (02/01 0628) SpO2:  [93 %-99 %] 97 % (02/01 0823) Weight:  [190 lb 0.6 oz (86.2 kg)] 190 lb 0.6 oz (86.2 kg) (02/01 0300) Physical Exam  Constitutional: He is oriented to person, place, and time. He appears well-developed and well-nourished. No distress.  HENT:  Mouth/Throat: Oropharynx is clear and moist. No oropharyngeal exudate.  Cardiovascular: normal S1,s2, no g/m/r. Chest wall= pacer pocket well healed, no erythema Pulmonary/Chest: diminished breath  sounds throughout but good air movement, no frank crackles this morning Abdominal: Soft. Bowel sounds are normal. He exhibits no distension. There is no tenderness.  Lymphadenopathy:  no cervical adenopathy.  Skin: Skin is warm and dry. No rash noted. No erythema. Crusty patch on right dorsum of hand Psychiatric: He has a normal mood and affect. His behavior is normal.  Ext= no c/c/e, no splinter hemorrhages  LABS: Results for orders placed during the hospital encounter of 05/03/12 (from the past 48 hour(s))  BASIC METABOLIC PANEL     Status: Abnormal   Collection Time   05/05/12  6:55 AM      Component Value Range Comment   Sodium 132 (*) 135 - 145 mEq/L    Potassium 3.6  3.5 - 5.1 mEq/L    Chloride 98  96 - 112 mEq/L    CO2 25  19 - 32 mEq/L    Glucose, Bld 97  70 - 99 mg/dL    BUN 31 (*) 6 - 23 mg/dL    Creatinine, Ser 0.45 (*) 0.50 - 1.35 mg/dL    Calcium 8.5  8.4 - 40.9 mg/dL    GFR calc non Af Amer 40 (*) >90 mL/min    GFR calc Af Amer 46 (*) >90 mL/min   CBC     Status: Abnormal   Collection Time    05/05/12  6:55 AM      Component Value Range Comment   WBC 10.0  4.0 - 10.5 K/uL    RBC 3.05 (*) 4.22 - 5.81 MIL/uL    Hemoglobin 9.2 (*) 13.0 - 17.0 g/dL    HCT 81.1 (*) 91.4 - 52.0 %    MCV 90.8  78.0 - 100.0 fL    MCH 30.2  26.0 - 34.0 pg    MCHC 33.2  30.0 - 36.0 g/dL    RDW 78.2  95.6 - 21.3 %    Platelets 180  150 - 400 K/uL   MAGNESIUM     Status: Normal   Collection Time   05/05/12  6:55 AM      Component Value Range Comment   Magnesium 1.9  1.5 - 2.5 mg/dL   VITAMIN Y86     Status: Normal   Collection Time   05/05/12  8:35 AM      Component Value Range Comment   Vitamin B-12 702  211 - 911 pg/mL   FOLATE     Status: Normal   Collection Time   05/05/12  8:35 AM      Component Value Range Comment   Folate >20.0     IRON AND TIBC     Status: Abnormal   Collection Time   05/05/12  8:35 AM      Component Value Range Comment   Iron <10 (*) 42 - 135 ug/dL    TIBC Not calculated due to Iron <10.  215 - 435 ug/dL    Saturation Ratios Not calculated due to Iron <10.  20 - 55 %    UIBC 168  125 - 400 ug/dL   FERRITIN     Status: Normal   Collection Time   05/05/12  8:35 AM      Component Value Range Comment   Ferritin 142  22 - 322 ng/mL   RETICULOCYTES     Status: Abnormal   Collection Time   05/05/12  8:35 AM      Component Value Range Comment   Retic Ct Pct 0.9  0.4 -  3.1 %    RBC. 3.27 (*) 4.22 - 5.81 MIL/uL    Retic Count, Manual 29.4  19.0 - 186.0 K/uL   BASIC METABOLIC PANEL     Status: Abnormal   Collection Time   05/06/12  6:15 AM      Component Value Range Comment   Sodium 133 (*) 135 - 145 mEq/L    Potassium 3.8  3.5 - 5.1 mEq/L    Chloride 99  96 - 112 mEq/L    CO2 25  19 - 32 mEq/L    Glucose, Bld 98  70 - 99 mg/dL    BUN 35 (*) 6 - 23 mg/dL    Creatinine, Ser 1.61 (*) 0.50 - 1.35 mg/dL    Calcium 8.4  8.4 - 09.6 mg/dL    GFR calc non Af Amer 40 (*) >90 mL/min    GFR calc Af Amer 46 (*) >90 mL/min     MICRO: 1/29 blood cx x 4/4 bottles: enterococcus (  sensitivities pending)  IMAGING: 1/29 cxr Cardiomegaly. Central vascular congestion. Left chest  wall battery pack with unchanged lead tip positions. Increased  interstitial markings and peribronchial thickening. Mild right  greater than left apical scarring/pleural thickening and small  right upper lobe bulla. Otherwise, no confluent airspace opacity.  No pleural effusion or pneumothorax. No acute osseous finding.  IMPRESSION:  Increased interstitial markings and peribronchial thickening may be  chronic or acute on chronic; as can be seen with edema or  Bronchitis.   (not significantly different from cxr on 07/15/11) 05/04/12 TTE: Difficult acoustic windows. LVEF is approximately 20% with akineis of the anterior, prox/distal inferior and posterior, md/distal lateral and apical walls. The cavity size was normal. Features are consistent with a pseudonormal left ventricular filling pattern, with concomitant abnormal relaxation and increased filling pressure (grade 2 diastolic dysfunction). - Left atrium: The atrium was mildly dilated.  Assessment/Plan:  79 yo with CAD c/b ICM as well as bi-V pacer in 2013, pAfib, CKD, COPD presents with fevers, leukocytosis, and influenza like illness now found to have enterococcal bacteremia, concern for cardiac device enterococcal infection/endocarditis:  1)  recommend to get TEE to evaluate for vegetation on valves or leads  2) antibiotic regimen for enterococcal endocarditis is 6 wks of ampicillin plus gent, but other regimens have been successful in patients with CKD and situation to  spare nephrotoxicity of gentamicin. Since patient is improving, enterococcus species is likely amp-S.   - Recommend to change his regimen to ampicillin 2gm IV Q 4hr plus ceftriaxone 2gm IV Q 12hr. Discontinue piptazo. - may consider to increase diuretics if we keep him on this regimen since it may increase his overall fluid intake.  3) document clearance of  bacteremia - will repeat blood cultures today to see that he is no longer bacteremic  - will need to follow up on sensitivities from blood cx drawn on 1/29; if amp-R, recommend to change his regimen to daptomycin 8mg /kg IV daily and get baseline CK.  4) decision for device removal should be carefully evaluated given current data, pros and cons of device removal, and can be addressed after TEE is performed  5) questionable pneumonia  - consider repeating chest x ray to see if findings are more consistent with pulm edema vs. Pneumonia.  Alternative diagnosis of his cxr may be septic emboli.Since his cxr has not significantly changed from a prior xray, this may favor CHF exacerbation. Current antibiotics would cover for cap.   Literature:  Kerin Ransom  N et al. Ampicillin plus ceftriaxone is as effective as ampicillin plus gentamicin for treating enterococcus faecalis infective endocarditis.Clin Infect Dis. 2013 May;56(9):1261-8.  Thank you for consultation. Will continue to follow.  Greater than 45 min spent with patient, reviewing information.  Duke Salvia Drue Second MD MPH [facility] [phone]

## 2012-05-06 NOTE — Progress Notes (Signed)
  Family Medicine Teaching Service Brief Note   Patient name: Andrew [name] Medical record number: [id number] Date of birth: Aug 08, 1930 Age: 79 y.o. Gender: male Length of Stay:  LOS: 3 days     Gram positive Rods noted to be Enterococcus.  Sensitivities pending. Discussed with Pharmacy, will change to Zosyn for coverage with planned Augmentin if sensitive   Andrena Mews, DO [facility] Resident - PGY-2 05/06/2012 11:57 AM  To Contact FMTS 24/7, please use Pager # [phone]

## 2012-05-06 NOTE — Progress Notes (Signed)
Pt had 7 beats V tach assymptomatic, pt has had runs of V tach in the past, will continue to monitor

## 2012-05-07 ENCOUNTER — Inpatient Hospital Stay (HOSPITAL_COMMUNITY): Payer: Medicare Other

## 2012-05-07 DIAGNOSIS — I509 Heart failure, unspecified: Secondary | ICD-10-CM

## 2012-05-07 DIAGNOSIS — I2510 Atherosclerotic heart disease of native coronary artery without angina pectoris: Secondary | ICD-10-CM

## 2012-05-07 DIAGNOSIS — I10 Essential (primary) hypertension: Secondary | ICD-10-CM

## 2012-05-07 LAB — CULTURE, BLOOD (ROUTINE X 2)

## 2012-05-07 LAB — BASIC METABOLIC PANEL
CO2: 26 mEq/L (ref 19–32)
Calcium: 9 mg/dL (ref 8.4–10.5)
Creatinine, Ser: 1.36 mg/dL — ABNORMAL HIGH (ref 0.50–1.35)
GFR calc Af Amer: 55 mL/min — ABNORMAL LOW (ref >90)
GFR calc non Af Amer: 48 mL/min — ABNORMAL LOW (ref >90)

## 2012-05-07 LAB — CBC
MCV: 89.8 fL (ref 78.0–100.0)
Platelets: 230 10*3/uL (ref 150–400)
RDW: 14 % (ref 11.5–15.5)
WBC: 6.9 10*3/uL (ref 4.0–10.5)

## 2012-05-07 LAB — OCCULT BLOOD X 1 CARD TO LAB, STOOL: Fecal Occult Bld: NEGATIVE

## 2012-05-07 MED ORDER — MAGNESIUM HYDROXIDE 400 MG/5ML PO SUSP
15.0000 mL | Freq: Every day | ORAL | Status: DC | PRN
  Filled 2012-05-07: qty 30

## 2012-05-07 MED ORDER — SODIUM CHLORIDE 0.9 % IV SOLN
INTRAVENOUS | Status: DC
  Administered 2012-05-08: 10 mL/h via INTRAVENOUS

## 2012-05-07 NOTE — Progress Notes (Signed)
SUBJECTIVE:  Feels much better at this time.  States he is near baseline.  Denies CP or SOB.  NO further fevers or chills.  PHYSICAL EXAM Physical Exam: Filed Vitals:   05/06/12 2022 05/06/12 2207 05/07/12 0000 05/07/12 0456  BP: 164/55 178/57 158/78 158/71  Pulse: 74   69  Temp: 97.7 F (36.5 C)   97.5 F (36.4 C)  TempSrc: Oral   Oral  Resp: 20   18  Height:      Weight:    189 lb 3.2 oz (85.821 kg)  SpO2: 95%   97%    GEN- The patient is well appearing, alert and oriented x 3 today.   Head- normocephalic, atraumatic Eyes-  Sclera clear, conjunctiva pink Ears- hearing intact Oropharynx- clear Neck- supple, Lungs- bibasilar rales, normal work of breathing Chest- pacemaker pocket is well healed Heart- Regular rate and rhythm GI- soft, NT, ND, + BS Extremities- no clubbing, cyanosis, or edema MS- no significant deformity or atrophy Skin- no rash or lesion Psych- euthymic mood, full affect Neuro- strength and sensation are intact  LABS:  Results for orders placed during the hospital encounter of 05/03/12 (from the past 24 hour(s))  BASIC METABOLIC PANEL     Status: Abnormal   Collection Time   05/07/12  4:55 AM      Component Value Range   Sodium 137  135 - 145 mEq/L   Potassium 4.2  3.5 - 5.1 mEq/L   Chloride 102  96 - 112 mEq/L   CO2 26  19 - 32 mEq/L   Glucose, Bld 107 (*) 70 - 99 mg/dL   BUN 29 (*) 6 - 23 mg/dL   Creatinine, Ser 4.09 (*) 0.50 - 1.35 mg/dL   Calcium 9.0  8.4 - 81.1 mg/dL   GFR calc non Af Amer 48 (*) >90 mL/min   GFR calc Af Amer 55 (*) >90 mL/min  CBC     Status: Abnormal   Collection Time   05/07/12  4:55 AM      Component Value Range   WBC 6.9  4.0 - 10.5 K/uL   RBC 3.13 (*) 4.22 - 5.81 MIL/uL   Hemoglobin 9.5 (*) 13.0 - 17.0 g/dL   HCT 91.4 (*) 78.2 - 95.6 %   MCV 89.8  78.0 - 100.0 fL   MCH 30.4  26.0 - 34.0 pg   MCHC 33.8  30.0 - 36.0 g/dL   RDW 21.3  08.6 - 57.8 %   Platelets 230  150 - 400 K/uL    Intake/Output Summary (Last  24 hours) at 05/07/12 0836 Last data filed at 05/07/12 0824  Gross per 24 hour  Intake   1360 ml  Output   1700 ml  Net   -340 ml    ASSESSMENT AND PLAN: 1. Bacteremia Pt with blood cultures growing enterococcus.  ID consultation appreciated.  I agree that TEE is the next appropriate step for this patient.  I will schedule TEE for tomorrow am.  Risks, benefits, and alternatives to this procedure were discussed at length with the patient who wishes to proceed.  2. Acute on chronic systolic and diastolic HF/ ischemic CM:  Improved.  Stop milrinone today.  Continue current oral meds as long as BP allows.      3. afib- on xarelto  4. S/p BiV pacemaker  Will plan to interrogate device tomorrow to see if he is device dependant.  His device is followed by Dr Graciela Husbands.  We will  ask Dr Graciela Husbands to assist with decision making about potential extraction following TEE.  Hillis Range 05/07/2012 8:36 AM

## 2012-05-07 NOTE — Progress Notes (Signed)
Family Medicine Teaching Service Daily Progress Note Intern Pager: [phone]  Patient name: Andrew [name] Medical record number: [id number] Date of birth: 27-Apr-1930 Age: 79 y.o. Gender: male  Primary Care Provider: Lucilla Edin, MD  Subjective:   No acute events overnight; patient does note anxiety about medical condition, because he does not understand how he got the infection or whether or not it is serious; we spoke for approximately 15 minutes about the infection, antibiotic treatment, and need for further evaluation by the cardiologist and he acknowledged understanding  Objective: Temp:  [97.5 F (36.4 C)-97.8 F (36.6 C)] 97.5 F (36.4 C) (02/02 0456) Pulse Rate:  [69-79] 69  (02/02 0456) Resp:  [18-20] 18  (02/02 0456) BP: (141-178)/(55-78) 158/71 mmHg (02/02 0456) SpO2:  [93 %-98 %] 98 % (02/02 0846) Weight:  [189 lb 3.2 oz (85.821 kg)] 189 lb 3.2 oz (85.821 kg) (02/02 0456)  Exam: General: elderly man, sitting up at bedside in NAD, speaking in full sentences, very comfortable on RA Cardiovascular: RRR, no murmur appreciated Respiratory: normal WOB, good air movement, no crackles or wheezes Abdomen: soft, nontender, BS+ Extremities: distal pulses 2+/symmetric, warm/well-perfused, no new/worse LE edema  Laboratory:  Lab 05/07/12 0455 05/05/12 0655 05/04/12 0635  WBC 6.9 10.0 13.0*  HGB 9.5* 9.2* 9.7*  HCT 28.1* 27.7* 30.3*  PLT 230 180 194    Lab 05/07/12 0455 05/06/12 0615 05/05/12 0655 05/04/12 0635  NA 137 133* 132* --  K 4.2 3.8 3.6 --  CL 102 99 98 --  CO2 26 25 25  --  BUN 29* 35* 31* --  CREATININE 1.36* 1.58* 1.58* --  CALCIUM 9.0 8.4 8.5 --  PROT -- -- -- 6.6  BILITOT -- -- -- 0.5  ALKPHOS -- -- -- 60  ALT -- -- -- 9  AST -- -- -- 18  GLUCOSE 107* 98 97 --   Cardiac Panel (last 3 results)  Basename 05/04/12 1221  CKTOTAL --  CKMB --  TROPONINI <0.30  RELINDX --   Micro:   Blood cultures, 1/29 @2356  and 2358: Enterococcus susceptible to  Amp   Blood cultures, 2/1 - NGTD  Lactic acid, 1/29 @2356 : 1.3    05/04/2012 01:09   Color, Urine  YELLOW   APPearance  CLEAR   Specific Gravity, Urine  1.013   pH  5.5   Glucose  NEGATIVE   Bilirubin Urine  NEGATIVE   Ketones, ur  NEGATIVE   Protein  100 (A)   Urobilinogen, UA  0.2   Nitrite  NEGATIVE   Leukocytes, UA  NEGATIVE   Hgb urine dipstick  NEGATIVE   WBC, UA  0-2   RBC / HPF  3-6   Squamous Epithelial / LPF  RARE   Bacteria, UA  RARE   Casts  HYALINE CASTS (A)    Imaging/Diagnostic Tests: CXR, 1/29 @2256   Findings: Cardiomegaly. Central vascular congestion. Left chest  wall battery pack with unchanged lead tip positions. Increased  interstitial markings and peribronchial thickening. Mild right  greater than left apical scarring/pleural thickening and small  right upper lobe bulla. Otherwise, no confluent airspace opacity.  No pleural effusion or pneumothorax. No acute osseous finding.  IMPRESSION:  Increased interstitial markings and peribronchial thickening may be  chronic or acute on chronic; as can be seen with edema or  bronchitis.  Assessment and Plan: Andrew [name] is a 79 y.o. year old male with PMH significant for ischemic CHF with reduced EF (15% in 02/2011) with biventricular  pacer, CAD s/p multiple MI (multiple stenting, remote hx of CABG), hypertension, paroxysmal a-fib/a-flutter presenting with acutely worsening fatigue and weakness for the past few days and gradually for the past few weeks. Pt febrile in the ED to 101. CXR consistent with chronic/acute-on-chronic increased pulmonary congestion/edema and possible RML early infiltrate; with elevated WBC 16.3, fever, and BNP 10.8k, concern for CAP (meeting sepsis criteria on admission; tachypnea, temp, elevated WB with possible source) with superimposed CHF exacerbation. Admitted 1/31 early AM.  #ID - Sepsis  > Suspected CAP - Presumptive given slightly increased O2 demand (pt uses 2L [place] at night, at  home), increased WBC, and appearance of CXR. No recent hospitalization to suggest HCAP. Urine Strep pneumo and Legionella antigens both negative; Started on Azithro and CTX for CAP in ED; Stopped 2/1  > Enterococcus Bacteremia, Amp sensitive - 2 blood cultures positive on 1/29; possible source is pacemaker   - placed on 6 week course of Amp and CTX per recommendation of ID team  - obtain TTE on 2/3, consider pacer removal   - Very appreciative of efforts of Dr. Drue Second and ID team    #COPD - fairly severe but not formally classified; pt reports nearly 60 pack-year smoking history (quit 4 years ago) - Cont dulera and scheduled albuterol nebs - prednisone d/c'd given infection   #Cardiology:   > CHF with decreased EF (<15%)-   -Milrinone stopped per cardiology since CHF mproving   -cont lasix 40 daily   - Cardiology to interrogate pacer on 2/3 > Endocarditis - Enterococcus   - TEE on 2/3   - Cont Amp and CTX > A-fib  - continue Xarelto for a-fib, dose adjusted for renal function; does not report daily ASA at home  - consult to care management for home heart failure screen   - Very appreciative of cardiology recommendations and treatment   #HTN - stable BP - Cont carvedilol, hydralazine, and losartan    #Chronic anemia - ?related to kidney disease/chronic illness -Hb stable around baseline 9, here; will monitor intermittently as needed - anemia panel shows iron <10; s/p Feraheme on 2/1 - stop Fe supplementation in setting of bacteremia   #Chronic arthritic pain/anxiety - No new/worse complaints  -ordered for Norco 5-235 q6 and Xanax 0.25 daily PRN (home doses)   #BPH - On Flomax, continue. No current new/worse complaints.   #GERD - On Nexium at home, 40 mg BID  -substitute Protonix while admitted   FEN/GI: heart-healthy diet, saline lock IV  Prophylaxis: Therapeutic anticoagulation (Xarelto --> dose reduced for renal) and PPI (Protonix for home Nexium)  Disposition: Management  as above. Anticipate discharge home to self-care with clinical improvement.  Code Status: Full code  Mat Carne, MD 05/07/2012, 10:54 AM

## 2012-05-07 NOTE — Progress Notes (Signed)
[facility]    Date of Admission:  05/03/2012     Total days of antibiotics 5 Day 2 ceftriaxone 2gm IV Q12hr Day 2 ampicilin 2gm IV Q4hr  (ceftriaxone 1gm IV daily, 1/29-2/1)  (azithromycin, 1/29- 2/1) (oseltamivir x 1 - d/c'd 1/30) (piptazo x 1- D/c'd 05/06/12)   ID: Andrew [name] is a 79 y.o. male with history of CAD c/b ICM with EF 15% by echo 02/2011 s/p Bi-V pacemaker 06/2011, COPD on 2LNC supp O2, PAF/flutter on Xarelto, & CKD (baseline Cr 1.4) who presented to the ED on 1/29 with complaints of worsening fatigue & malaise, feverish for 2 days with tmax  of 101.8. Found to have presumed e.faecalis bacteremia (amp S) +/-pneumonia vs. Pulmonary edema. Currently on amp plus ceftriaxone, afebrile, leukocytosis improved.  Principal Problem:  *Community acquired pneumonia Active Problems:  UNSPECIFIED ANEMIA  COPD UNSPECIFIED  GERD  Chronic systolic heart failure  Hypertension  Ischemic cardiomyopathy  Hyperlipidemia  Coronary atherosclerosis of native coronary artery  S/P biventricular cardiac pacemaker-Medtronic  Acute on chronic congestive heart failure  Sepsis  COPD with acute exacerbation    Subjective: Afebrile, feeling better. No chills, nor nightsweats, no CP or SOB  Medications:     . albuterol  2.5 mg Nebulization Q6H  . ampicillin (OMNIPEN) IV  2 g Intravenous Q4H  . carvedilol  25 mg Oral BID WC  . cefTRIAXone (ROCEPHIN)  IV  2 g Intravenous Q12H  . furosemide  40 mg Oral Daily  . hydrALAZINE  10 mg Oral Q8H  . losartan  50 mg Oral BID  . mometasone-formoterol  2 puff Inhalation BID  . pantoprazole  80 mg Oral BID  . rivaroxaban  15 mg Oral Q supper  . sodium chloride  3 mL Intravenous Q12H  . Tamsulosin HCl  0.4 mg Oral BID    Objective: Vital signs in last 24 hours: Temp:  [97.5 F (36.4 C)-97.8 F (36.6 C)] 97.5 F (36.4 C) (02/02 0456) Pulse Rate:  [69-79] 69  (02/02 0456) Resp:  [18-20] 18  (02/02 0456) BP:  (141-178)/(55-78) 158/71 mmHg (02/02 0456) SpO2:  [93 %-98 %] 98 % (02/02 0846) Weight:  [189 lb 3.2 oz (85.821 kg)] 189 lb 3.2 oz (85.821 kg) (02/02 0456) Physical Exam  Constitutional: He is oriented to person, place, and time. He appears well-developed and well-nourished. No distress.  HENT:  Mouth/Throat: Oropharynx is clear and moist. No oropharyngeal exudate.  Cardiovascular: Normal rate, regular rhythm and normal heart sounds. Exam reveals no gallop and no friction rub.  No murmur heard. Chest wall: pacer pocket not tender or warm  Pulmonary/Chest: Effort normal and breath sounds normal. No respiratory distress. He has no wheezes.  Abdominal: Soft. Bowel sounds are normal. He exhibits no distension. There is no tenderness.  Lymphadenopathy:  no cervical adenopathy.  Neurological: He is alert and oriented to person, place, and time.  Skin: Skin is warm and dry. No rash noted. No erythema. Right dorsum of hand lesion is unchanged    Lab Results  Basename 05/07/12 0455 05/06/12 0615 05/05/12 0655  WBC 6.9 -- 10.0  HGB 9.5* -- 9.2*  HCT 28.1* -- 27.7*  NA 137 133* --  K 4.2 3.8 --  CL 102 99 --  CO2 26 25 --  BUN 29* 35* --  CREATININE 1.36* 1.58* --  GLU -- -- --    Microbiology: 1/29 blood cx x 4/4 bottles: enterococcus ( amp S< 2; gent S;  vanco 2 )  2/1 blood cx x 2 sets: pending  Studies/Results: Dg Chest 2 View  05/07/2012  *RADIOLOGY REPORT*  Clinical Data: COPD  CHEST - 2 VIEW  Comparison: 05/03/2012  Findings: Left subclavian pacemaker stable.  Mild cardiomegaly stable.  No effusion.  Stable mild perihilar and bibasilar interstitial prominence.  No confluent airspace infiltrate or overt edema.  Regional bones unremarkable.  IMPRESSION:  1.  Stable cardiomegaly and coarse interstitial opacities.   Original Report Authenticated By: D. Andria Rhein, MD    - per my interpretation, it appears that cxr from 2/2 has increased bibasilar interstitial opacities. 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 This is an alternative regimen for enterococcal faecalis endocarditis treatment which is gentamicin sparing to minimize kidney injury. We may need to revert back to gentamicin if patient does not appear to be clearing his bacteremia.  - may consider to increase diuretics if we keep him on this regimen since it may increase his overall fluid intake.   2) patient is on schedule per Dr. Johney Frame for TEE to evaluate for vegetation on valves or leads as swell as device interrogation to determine if he is pacemaker dependent.  3) if + TEE, antibiotic duration will be 6 wks from first day of documented clearance of bacteremia. Continue with amp and ceftriaxone for now.   4) will follow up on cultures from 05/06/12 to document  clearance of bacteremia   5) pending TEE results we will discuss with Dr. Graciela Husbands the decision for device removal for cardiac device related infection.  My partner, Cliffton Asters, will be provide remaining ID recs on Andrew [name] starting 05/08/2012. Call if any questions from today's recommendations.  Literature:  Kerin Ransom N et al. Ampicillin plus ceftriaxone is as effective as ampicillin plus gentamicin for treating enterococcus faecalis infective endocarditis.Clin Infect Dis. 2013 May;56(9):1261-8.   Drue Second [facility] for Infectious Diseases Cell: [phone] Pager: [phone]  05/07/2012, 10:48 AM

## 2012-05-07 NOTE — Progress Notes (Signed)
FMTS Attending Daily Note:  Andrew Don MD  [phone] pager  Family Practice pager:  [phone] I have seen and examined this patient and have reviewed their chart. I have discussed this patient with the resident. I agree with the resident's findings, assessment and care plan.  Addtionally: - Positive blood cultures for Enterococcus returned yesterday, sensitive to ampicillin.  Afebrile overnight. - Hypotension has resolved since addition of ampicillin to regimen.  Milrinone stopped.  - Greatly appreciate cardiology/ID input.  Plan now is for TEE tomorrow with discussion about removal of pacemaker pending TEE results.  Continue IV antibiotics for now.

## 2012-05-08 ENCOUNTER — Encounter (HOSPITAL_COMMUNITY)
Admission: EM | Disposition: A | Discharge status: Home / Self Care | Payer: Self-pay | Source: Home / Self Care | Attending: Family Medicine

## 2012-05-08 ENCOUNTER — Encounter (HOSPITAL_COMMUNITY): Payer: Self-pay | Admitting: *Deleted

## 2012-05-08 DIAGNOSIS — R7881 Bacteremia: Secondary | ICD-10-CM

## 2012-05-08 DIAGNOSIS — I519 Heart disease, unspecified: Secondary | ICD-10-CM

## 2012-05-08 DIAGNOSIS — J189 Pneumonia, unspecified organism: Secondary | ICD-10-CM

## 2012-05-08 HISTORY — PX: TEE WITHOUT CARDIOVERSION: SHX5443

## 2012-05-08 LAB — BASIC METABOLIC PANEL
Calcium: 8.8 mg/dL (ref 8.4–10.5)
GFR calc Af Amer: 61 mL/min — ABNORMAL LOW (ref >90)
GFR calc non Af Amer: 53 mL/min — ABNORMAL LOW (ref >90)
Glucose, Bld: 91 mg/dL (ref 70–99)
Potassium: 3.9 mEq/L (ref 3.5–5.1)
Sodium: 138 mEq/L (ref 135–145)

## 2012-05-08 LAB — CBC
Hemoglobin: 9.5 g/dL — ABNORMAL LOW (ref 13.0–17.0)
MCH: 29.8 pg (ref 26.0–34.0)
Platelets: 252 10*3/uL (ref 150–400)
RBC: 3.19 MIL/uL — ABNORMAL LOW (ref 4.22–5.81)

## 2012-05-08 SURGERY — ECHOCARDIOGRAM, TRANSESOPHAGEAL
Anesthesia: Moderate Sedation

## 2012-05-08 MED ORDER — FENTANYL CITRATE 0.05 MG/ML IJ SOLN
INTRAMUSCULAR | Status: DC | PRN
  Administered 2012-05-08: 50 ug via INTRAVENOUS
  Administered 2012-05-08: 25 ug via INTRAVENOUS

## 2012-05-08 MED ORDER — RIVAROXABAN 20 MG PO TABS
20.0000 mg | ORAL_TABLET | Freq: Every day | ORAL | Status: DC
  Administered 2012-05-08 – 2012-05-09 (×2): 20 mg via ORAL
  Filled 2012-05-08 (×4): qty 1

## 2012-05-08 MED ORDER — FENTANYL CITRATE 0.05 MG/ML IJ SOLN
INTRAMUSCULAR | Status: AC
  Filled 2012-05-08: qty 2

## 2012-05-08 MED ORDER — DIPHENHYDRAMINE HCL 50 MG/ML IJ SOLN
INTRAMUSCULAR | Status: AC
  Filled 2012-05-08: qty 1

## 2012-05-08 MED ORDER — SODIUM CHLORIDE 0.9 % IV SOLN
INTRAVENOUS | Status: DC
  Administered 2012-05-08: 500 mL via INTRAVENOUS

## 2012-05-08 MED ORDER — MIDAZOLAM HCL 5 MG/ML IJ SOLN
INTRAMUSCULAR | Status: AC
  Filled 2012-05-08: qty 2

## 2012-05-08 MED ORDER — MIDAZOLAM HCL 10 MG/2ML IJ SOLN
INTRAMUSCULAR | Status: DC | PRN
  Administered 2012-05-08 (×2): 1 mg via INTRAVENOUS
  Administered 2012-05-08: 2 mg via INTRAVENOUS

## 2012-05-08 MED ORDER — BUTAMBEN-TETRACAINE-BENZOCAINE 2-2-14 % EX AERO
INHALATION_SPRAY | CUTANEOUS | Status: DC | PRN
  Administered 2012-05-08: 2 via TOPICAL

## 2012-05-08 MED ORDER — HYDRALAZINE HCL 25 MG PO TABS
25.0000 mg | ORAL_TABLET | Freq: Three times a day (TID) | ORAL | Status: DC
  Administered 2012-05-08 – 2012-05-10 (×6): 25 mg via ORAL
  Filled 2012-05-08 (×7): qty 1

## 2012-05-08 NOTE — Progress Notes (Signed)
Mr Andrew [name] evaluated for [facility] Care Management services. Spoke with patient at bedside who pleasantly declined [facility] Care Management services at this time. Reports his wife does a good job in taking care of him at home along with keeping him compliant with his medications and so forth. However, he did agree to a post transition of care call upon discharge. Made him aware that he is welcome to call as well if he changes his mind. [facility] Care Management literature and contact information left with patient. Appreciative of visit.  Andrew Noble, MSN- Ed, Charity fundraiser, BSN [facility] Liaison [phone]

## 2012-05-08 NOTE — Progress Notes (Signed)
Echocardiogram Echocardiogram Transesophageal has been performed.  Andrew [name] 05/08/2012, 4:44 PM

## 2012-05-08 NOTE — CV Procedure (Addendum)
    Transesophageal Echocardiogram Note  DUNBAR BURAS [id number] [birth year]  Procedure: Transesophageal Echocardiogram Indications: bacteremia  Procedure Details Consent: Obtained Time Out: Verified patient identification, verified procedure, site/side was marked, verified correct patient position, special equipment/implants available, Radiology Safety Procedures followed,  medications/allergies/relevent history reviewed, required imaging and test results available.  Performed  Medications: Fentanyl: 75 mcg IV Versed: 4 mg IV  Left Ventrical:  Moderate - severe LV dysfunction.  EF 35-40%  Mitral Valve: normal MV, no vegetation, trace MR  Aortic Valve: normal AoV, no vegetatioin  Tricuspid Valve: normal , vegetation  Pulmonic Valve: normal  Left Atrium/ Left atrial appendage: no thrombi  Atrial septum: normal  Aorta: mild calcification  Pacer leads:  All 3 pacer leads were seen fairly well.   No vegetations were seen.     Complications: No apparent complications Patient did tolerate procedure well.   Vesta Mixer, Montez Hageman., MD, [facility] 05/08/2012, 4:20 PM

## 2012-05-08 NOTE — Plan of Care (Signed)
Problem: Phase II Progression Outcomes Goal: Pain controlled Outcome: Completed/Met Date Met:  05/08/12 Pt has not had any c/o c/p, pt has chronic back pain, goal met Goal: Other Phase II Outcomes/Goals Outcome: Completed/Met Date Met:  05/08/12 Pt had TEE today, back to the floor without c/o pain or discomfort

## 2012-05-08 NOTE — H&P (View-Only) (Signed)
 PROGRESS NOTE  Subjective:   Pt has a hx of CHF and has an ICD.  He was recently admitted with weakness and fatigue and was found to have an enteriococcus sepsis.   He is scheduled for TEE today.   Objective:    Vital Signs:   Temp:  [97.4 F (36.3 C)-97.9 F (36.6 C)] 97.4 F (36.3 C) (02/03 0300) Pulse Rate:  [62-67] 62  (02/03 0300) Resp:  [16-20] 16  (02/03 0300) BP: (152-185)/(56-80) 152/56 mmHg (02/03 0300) SpO2:  [96 %-100 %] 96 % (02/03 0931) Weight:  [188 lb 4.8 oz (85.412 kg)] 188 lb 4.8 oz (85.412 kg) (02/03 0300)  Last BM Date: 05/03/12   24-hour weight change: Weight change: -14.4 oz (-0.408 kg)  Weight trends: Filed Weights   05/06/12 0300 05/07/12 0456 05/08/12 0300  Weight: 190 lb 0.6 oz (86.2 kg) 189 lb 3.2 oz (85.821 kg) 188 lb 4.8 oz (85.412 kg)    Intake/Output:  02/02 0701 - 02/03 0700 In: 1873 [P.O.:1870; I.V.:3] Out: 2076 [Urine:2075; Stool:1] Total I/O In: -  Out: 100 [Urine:100]   Physical Exam: BP 152/56  Pulse 62  Temp 97.4 F (36.3 C) (Oral)  Resp 16  Ht 6' (1.829 m)  Wt 188 lb 4.8 oz (85.412 kg)  BMI 25.54 kg/m2  SpO2 96%  General: Vital signs reviewed and noted.   Head: Normocephalic, atraumatic.  Eyes: conjunctivae/corneas clear.  EOM's intact.   Throat: normal  Neck:  normal  Lungs:   clear  Heart:  RR  Abdomen:  Soft, non-tender, non-distended   Extremities: No edema   Neurologic: A&O X3, CN II - XII are grossly intact.  Psych: Normal     Labs: BMET:  Basename 05/08/12 0658 05/07/12 0455  NA 138 137  K 3.9 4.2  CL 101 102  CO2 30 26  GLUCOSE 91 107*  BUN 20 29*  CREATININE 1.25 1.36*  CALCIUM 8.8 9.0  MG -- --  PHOS -- --    Liver function tests: No results found for this basename: AST:2,ALT:2,ALKPHOS:2,BILITOT:2,PROT:2,ALBUMIN:2 in the last 72 hours No results found for this basename: LIPASE:2,AMYLASE:2 in the last 72 hours  CBC:  Basename 05/08/12 0658 05/07/12 0455  WBC 9.1 6.9  NEUTROABS --  --  HGB 9.5* 9.5*  HCT 29.0* 28.1*  MCV 90.9 89.8  PLT 252 230    Cardiac Enzymes: No results found for this basename: CKTOTAL:4,CKMB:4,TROPONINI:4 in the last 72 hours  Coagulation Studies: No results found for this basename: LABPROT:5,INR:5 in the last 72 hours  Other:   Other results:  Tele: V paced  Medications:    Infusions:    . sodium chloride 10 mL/hr (05/08/12 0000)    Scheduled Medications:    . albuterol  2.5 mg Nebulization Q6H  . ampicillin (OMNIPEN) IV  2 g Intravenous Q4H  . carvedilol  25 mg Oral BID WC  . cefTRIAXone (ROCEPHIN)  IV  2 g Intravenous Q12H  . furosemide  40 mg Oral Daily  . hydrALAZINE  10 mg Oral Q8H  . losartan  50 mg Oral BID  . mometasone-formoterol  2 puff Inhalation BID  . pantoprazole  80 mg Oral BID  . rivaroxaban  15 mg Oral Q supper  . sodium chloride  3 mL Intravenous Q12H  . Tamsulosin HCl  0.4 mg Oral BID    Assessment/ Plan:    Community acquired pneumonia (05/04/2012)  UNSPECIFIED ANEMIA (05/05/2009)  COPD UNSPECIFIED (12/02/2009)  Chronic systolic heart failure ,   due to ischemic cardiomyopathy  Hypertension ()  Hyperlipidemia (11/26/2010)  Coronary atherosclerosis of native coronary artery (01/15/2011)  S/P biventricular cardiac pacemaker-Medtronic (06/17/2011)  Sepsis (05/05/2012) For TEE today  COPD with acute exacerbation (05/06/2012)     Disposition: gave some juice this am.  NPO afterwards for TEE this afternoon. Length of Stay: 5  Dorrie Cocuzza J. Bryana Froemming, Jr., MD, FACC 05/08/2012, 9:40 AM Office [phone] Pager [phone]    

## 2012-05-08 NOTE — Progress Notes (Signed)
PROGRESS NOTE  Subjective:   Pt has a hx of CHF and has an ICD.  He was recently admitted with weakness and fatigue and was found to have an enteriococcus sepsis.   He is scheduled for TEE today.   Objective:    Vital Signs:   Temp:  [97.4 F (36.3 C)-97.9 F (36.6 C)] 97.4 F (36.3 C) (02/03 0300) Pulse Rate:  [62-67] 62  (02/03 0300) Resp:  [16-20] 16  (02/03 0300) BP: (152-185)/(56-80) 152/56 mmHg (02/03 0300) SpO2:  [96 %-100 %] 96 % (02/03 0931) Weight:  [188 lb 4.8 oz (85.412 kg)] 188 lb 4.8 oz (85.412 kg) (02/03 0300)  Last BM Date: 05/03/12   24-hour weight change: Weight change: -14.4 oz (-0.408 kg)  Weight trends: Filed Weights   05/06/12 0300 05/07/12 0456 05/08/12 0300  Weight: 190 lb 0.6 oz (86.2 kg) 189 lb 3.2 oz (85.821 kg) 188 lb 4.8 oz (85.412 kg)    Intake/Output:  02/02 0701 - 02/03 0700 In: 1873 [P.O.:1870; I.V.:3] Out: 2076 [Urine:2075; Stool:1] Total I/O In: -  Out: 100 [Urine:100]   Physical Exam: BP 152/56  Pulse 62  Temp 97.4 F (36.3 C) (Oral)  Resp 16  Ht 6' (1.829 m)  Wt 188 lb 4.8 oz (85.412 kg)  BMI 25.54 kg/m2  SpO2 96%  General: Vital signs reviewed and noted.   Head: Normocephalic, atraumatic.  Eyes: conjunctivae/corneas clear.  EOM's intact.   Throat: normal  Neck:  normal  Lungs:   clear  Heart:  RR  Abdomen:  Soft, non-tender, non-distended   Extremities: No edema   Neurologic: A&O X3, CN II - XII are grossly intact.  Psych: Normal     Labs: BMET:  Basename 05/08/12 0658 05/07/12 0455  NA 138 137  K 3.9 4.2  CL 101 102  CO2 30 26  GLUCOSE 91 107*  BUN 20 29*  CREATININE 1.25 1.36*  CALCIUM 8.8 9.0  MG -- --  PHOS -- --    Liver function tests: No results found for this basename: AST:2,ALT:2,ALKPHOS:2,BILITOT:2,PROT:2,ALBUMIN:2 in the last 72 hours No results found for this basename: LIPASE:2,AMYLASE:2 in the last 72 hours  CBC:  Basename 05/08/12 0658 05/07/12 0455  WBC 9.1 6.9  NEUTROABS --  --  HGB 9.5* 9.5*  HCT 29.0* 28.1*  MCV 90.9 89.8  PLT 252 230    Cardiac Enzymes: No results found for this basename: CKTOTAL:4,CKMB:4,TROPONINI:4 in the last 72 hours  Coagulation Studies: No results found for this basename: LABPROT:5,INR:5 in the last 72 hours  Other:   Other results:  Tele: V paced  Medications:    Infusions:    . sodium chloride 10 mL/hr (05/08/12 0000)    Scheduled Medications:    . albuterol  2.5 mg Nebulization Q6H  . ampicillin (OMNIPEN) IV  2 g Intravenous Q4H  . carvedilol  25 mg Oral BID WC  . cefTRIAXone (ROCEPHIN)  IV  2 g Intravenous Q12H  . furosemide  40 mg Oral Daily  . hydrALAZINE  10 mg Oral Q8H  . losartan  50 mg Oral BID  . mometasone-formoterol  2 puff Inhalation BID  . pantoprazole  80 mg Oral BID  . rivaroxaban  15 mg Oral Q supper  . sodium chloride  3 mL Intravenous Q12H  . Tamsulosin HCl  0.4 mg Oral BID    Assessment/ Plan:    Community acquired pneumonia (05/04/2012)  UNSPECIFIED ANEMIA (05/05/2009)  COPD UNSPECIFIED (12/02/2009)  Chronic systolic heart failure ,  due to ischemic cardiomyopathy  Hypertension ()  Hyperlipidemia (11/26/2010)  Coronary atherosclerosis of native coronary artery (01/15/2011)  S/P biventricular cardiac pacemaker-Medtronic (06/17/2011)  Sepsis (05/05/2012) For TEE today  COPD with acute exacerbation (05/06/2012)     Disposition: gave some juice this am.  NPO afterwards for TEE this afternoon. Length of Stay: 5  Vesta Mixer, Montez Hageman., MD, [facility] 05/08/2012, 9:40 AM Office [phone] Pager [phone]

## 2012-05-08 NOTE — Interval H&P Note (Signed)
History and Physical Interval Note:  05/08/2012 3:59 PM  Andrew [name]  has presented today for surgery, with the diagnosis of sepsas syndrom  The various methods of treatment have been discussed with the patient and family. After consideration of risks, benefits and other options for treatment, the patient has consented to  Procedure(s) (LRB) with comments: TRANSESOPHAGEAL ECHOCARDIOGRAM (TEE) (N/A) as a surgical intervention .  The patient's history has been reviewed, patient examined, no change in status, stable for surgery.  I have reviewed the patient's chart and labs.  Questions were answered to the patient's satisfaction.     Elyn Aquas.

## 2012-05-08 NOTE — Progress Notes (Signed)
Patient ID: MANTAJ CHAMBERLIN, male   DOB: 09/14/1930, 79 y.o.   MRN: [id number] Family Medicine Teaching Service Daily Progress Note Intern Pager: [phone]  Patient name: JAMONT MELLIN Medical record number: [id number] Date of birth: 1931/02/26 Age: 79 y.o. Gender: male  Primary Care Provider: Lucilla Edin, MD  Subjective:  No acute events overnight. Doing well this morning. Denies chest pain and shortness of breath. States is ready for his studies today.  Objective: Temp:  [97.4 F (36.3 C)-97.9 F (36.6 C)] 97.7 F (36.5 C) (02/03 1457) Pulse Rate:  [62-65] 65  (02/03 1428) Resp:  [12-21] 14  (02/03 1610) BP: (148-196)/(56-109) 163/73 mmHg (02/03 1610) SpO2:  [96 %-100 %] 98 % (02/03 1610) Weight:  [188 lb 4.8 oz (85.412 kg)] 188 lb 4.8 oz (85.412 kg) (02/03 0300)  Exam: General: elderly man, laying comfortably in bed, very comfortable on RA Cardiovascular: rrr, no mrg Respiratory: normal WOB, CTAB, no crackles or wheezes Abdomen: soft, NT, ND, BS+ Extremities: distal pulses 2+/symmetric, warm/well-perfused, no LE edema  Laboratory:  Lab 05/08/12 0658 05/07/12 0455 05/05/12 0655  WBC 9.1 6.9 10.0  HGB 9.5* 9.5* 9.2*  HCT 29.0* 28.1* 27.7*  PLT 252 230 180    Lab 05/08/12 0658 05/07/12 0455 05/06/12 0615 05/04/12 0635  NA 138 137 133* --  K 3.9 4.2 3.8 --  CL 101 102 99 --  CO2 30 26 25  --  BUN 20 29* 35* --  CREATININE 1.25 1.36* 1.58* --  CALCIUM 8.8 9.0 8.4 --  PROT -- -- -- 6.6  BILITOT -- -- -- 0.5  ALKPHOS -- -- -- 60  ALT -- -- -- 9  AST -- -- -- 18  GLUCOSE 91 107* 98 --   Micro:   Blood cultures, 1/29 @2356  and 2358: Enterococcus susceptible to Amp   Blood cultures, 2/1 - NGTD  Lactic acid, 1/29 @2356 : 1.3    05/04/2012 01:09   Color, Urine  YELLOW   APPearance  CLEAR   Specific Gravity, Urine  1.013   pH  5.5   Glucose  NEGATIVE   Bilirubin Urine  NEGATIVE   Ketones, ur  NEGATIVE   Protein  100 (A)   Urobilinogen, UA  0.2   Nitrite   NEGATIVE   Leukocytes, UA  NEGATIVE   Hgb urine dipstick  NEGATIVE   WBC, UA  0-2   RBC / HPF  3-6   Squamous Epithelial / LPF  RARE   Bacteria, UA  RARE   Casts  HYALINE CASTS (A)    Imaging/Diagnostic Tests: CXR, 1/29 @2256   Findings: Cardiomegaly. Central vascular congestion. Left chest  wall battery pack with unchanged lead tip positions. Increased  interstitial markings and peribronchial thickening. Mild right  greater than left apical scarring/pleural thickening and small  right upper lobe bulla. Otherwise, no confluent airspace opacity.  No pleural effusion or pneumothorax. No acute osseous finding.  IMPRESSION:  Increased interstitial markings and peribronchial thickening may be  chronic or acute on chronic; as can be seen with edema or  bronchitis.  Assessment and Plan: MARON STANZIONE is a 79 y.o. male with PMH significant for ischemic CHF with reduced EF (15% in 02/2011) with biventricular pacer, CAD s/p multiple MI (multiple stenting, remote hx of CABG), hypertension, paroxysmal a-fib/a-flutter presenting with acutely worsening fatigue and weakness for the past few days and gradually for the past few weeks. Pt febrile in the ED to 101. CXR consistent with chronic/acute-on-chronic increased pulmonary congestion/edema and  possible RML early infiltrate; with elevated WBC 16.3, fever, and BNP 10.8k, concern for CAP (meeting sepsis criteria on admission; tachypnea, temp, elevated WB with possible source) with superimposed CHF exacerbation. Admitted 1/31 early AM.  #ID - Sepsis   > Enterococcus Bacteremia, Amp sensitive - 2 blood cultures positive on 1/29; possible source is pacemaker   - placed on 6 week course of Amp and CTX per recommendation of ID team  - obtain TEE today, consider pacer removal   - Very appreciative of efforts of Dr. Drue Second and ID team    #COPD - not formally classified; pt reports nearly 60 pack-year smoking history (quit 4 years ago) - Cont dulera and  scheduled albuterol nebs - prednisone d/c'd given infection   #Cardiology:   > CHF with decreased EF (<15%)-   -Milrinone stopped per cardiology since CHF mproving   -cont lasix 40 daily   -Cardiology to interrogate pacer today-f/u results > Endocarditis - Enterococcus   - TEE today-f/u results  - Cont Amp and CTX > A-fib  - continue Xarelto for a-fib, given normal renal function will restart 20 mg; does not report  daily  ASA at home   - consult to care management for home heart failure screen    - Very appreciative of cardiology recommendations and treatment   #HTN - continue to monitor BP - Cont carvedilol, hydralazine, and losartan    #Chronic anemia - ?related to kidney disease/chronic illness -Hb stable around baseline 9, here; will monitor intermittently as needed - anemia panel shows iron <10; s/p Feraheme on 2/1 - stopped Fe supplementation in setting of bacteremia   #Chronic arthritic pain/anxiety -   -ordered for Norco 5-235 q6 and Xanax 0.25 daily PRN (home doses)   #BPH - On Flomax, continue.  #GERD - On Nexium at home, 40 mg BID  -substitute Protonix while admitted   FEN/GI: heart-healthy diet, saline lock IV  Prophylaxis: Therapeutic anticoagulation (Xarelto) and PPI (Protonix for home Nexium)  Disposition: pending results of pacer interogation and TEE. Anticipate discharge home to self-care with clinical improvement.  Code Status: Full code  Marikay Alar, MD 05/08/2012, 4:13 PM

## 2012-05-08 NOTE — Progress Notes (Signed)
Patient ID: Andrew [name], male   DOB: 03-02-1931, 79 y.o.   MRN: [id number]    [facility]    Date of Admission:  05/03/2012   Total days of antibiotics 6         Principal Problem:  *Community acquired pneumonia Active Problems:  UNSPECIFIED ANEMIA  COPD UNSPECIFIED  GERD  Chronic systolic heart failure  Hypertension  Ischemic cardiomyopathy  Hyperlipidemia  Coronary atherosclerosis of native coronary artery  S/P biventricular cardiac pacemaker-[employer]tronic  Acute on chronic congestive heart failure  Sepsis  COPD with acute exacerbation      . albuterol  2.5 mg Nebulization Q6H  . ampicillin (OMNIPEN) IV  2 g Intravenous Q4H  . carvedilol  25 mg Oral BID WC  . cefTRIAXone (ROCEPHIN)  IV  2 g Intravenous Q12H  . furosemide  40 mg Oral Daily  . hydrALAZINE  10 mg Oral Q8H  . losartan  50 mg Oral BID  . mometasone-formoterol  2 puff Inhalation BID  . pantoprazole  80 mg Oral BID  . rivaroxaban  15 mg Oral Q supper  . sodium chloride  3 mL Intravenous Q12H  . Tamsulosin HCl  0.4 mg Oral BID    Subjective: He is feeling much better  Objective: Temp:  [97.4 F (36.3 C)-97.9 F (36.6 C)] 97.4 F (36.3 C) (02/03 0300) [name] Rate:  [62-67] 62  (02/03 0300) Resp:  [16-20] 16  (02/03 0300) BP: (152-185)/(56-80) 152/56 mmHg (02/03 0300) SpO2:  [96 %-100 %] 96 % (02/03 0931) Weight:  [85.412 kg (188 lb 4.8 oz)] 85.412 kg (188 lb 4.8 oz) (02/03 0300)  General: Alert and comfortable Skin: No splinter or conjunctival hemorrhages, scattered ecchymoses Lungs: Clear Cor: Regular S1 and S2 with a 1/6 systolic murmur. Left upper chest pacemaker site appears normal  Lab Results Lab Results  Component Value Date   WBC 9.1 05/08/2012   HGB 9.5* 05/08/2012   HCT 29.0* 05/08/2012   MCV 90.9 05/08/2012   PLT 252 05/08/2012    Lab Results  Component Value Date   CREATININE 1.25 05/08/2012   BUN 20 05/08/2012   NA 138 05/08/2012   K 3.9 05/08/2012   CL 101 05/08/2012     CO2 30 05/08/2012    Microbiology: Recent Results (from the past 240 hour(s))  CULTURE, BLOOD (ROUTINE X 2)     Status: Normal   Collection Time   05/03/12 11:56 PM      Component Value Range Status Comment   Specimen Description BLOOD LEFT ARM   Final    Special Requests BOTTLES DRAWN AEROBIC AND ANAEROBIC 8CC   Final    Culture  Setup Time 05/04/2012 10:03   Final    Culture     Final    Value: ENTEROCOCCUS SPECIES     Note: SUSCEPTIBILITIES PERFORMED ON PREVIOUS CULTURE WITHIN THE LAST 5 DAYS.     Note: Gram Stain Report Called to,Read Back By and Verified With: ALICIA WALKER ON 05/04/2012 AT 11:12P BY WILEJ   Report Status 05/07/2012 FINAL   Final   CULTURE, BLOOD (ROUTINE X 2)     Status: Normal   Collection Time   05/03/12 11:58 PM      Component Value Range Status Comment   Specimen Description BLOOD LEFT ARM   Final    Special Requests BOTTLES DRAWN AEROBIC AND ANAEROBIC 10CC   Final    Culture  Setup Time 05/04/2012 10:03   Final  Culture     Final    Value: ENTEROCOCCUS SPECIES     Note: COMBINATION THERAPY OF HIGH DOSE AMPICILLIN OR VANCOMYCIN, PLUS AN AMINOGLYCOSIDE, IS USUALLY INDICATED FOR SERIOUS ENTEROCOCCAL INFECTIONS.     Note: Gram Stain Report Called to,Read Back By and Verified With: ALICIA WALKER ON 05/04/2012 AT 11:12P BY WILEJ   Report Status 05/07/2012 FINAL   Final    Organism ID, Bacteria ENTEROCOCCUS SPECIES   Final   CULTURE, BLOOD (ROUTINE X 2)     Status: Normal (Preliminary result)   Collection Time   05/06/12  6:20 PM      Component Value Range Status Comment   Specimen Description BLOOD LEFT ARM   Final    Special Requests BOTTLES DRAWN AEROBIC AND ANAEROBIC 10CC   Final    Culture  Setup Time 05/07/2012 03:50   Final    Culture     Final    Value:        BLOOD CULTURE RECEIVED NO GROWTH TO DATE CULTURE WILL BE HELD FOR 5 DAYS BEFORE ISSUING A FINAL NEGATIVE REPORT   Report Status PENDING   Incomplete   CULTURE, BLOOD (ROUTINE X 2)     Status:  Normal (Preliminary result)   Collection Time   05/06/12  6:26 PM      Component Value Range Status Comment   Specimen Description BLOOD LEFT HAND   Final    Special Requests BOTTLES DRAWN AEROBIC AND ANAEROBIC 10CC   Final    Culture  Setup Time 05/07/2012 03:50   Final    Culture     Final    Value:        BLOOD CULTURE RECEIVED NO GROWTH TO DATE CULTURE WILL BE HELD FOR 5 DAYS BEFORE ISSUING A FINAL NEGATIVE REPORT   Report Status PENDING   Incomplete     Studies/Results: Dg Chest 2 View  05/07/2012  *RADIOLOGY REPORT*  Clinical Data: COPD  CHEST - 2 VIEW  Comparison: 05/03/2012  Findings: Left subclavian pacemaker stable.  Mild cardiomegaly stable.  No effusion.  Stable mild perihilar and bibasilar interstitial prominence.  No confluent airspace infiltrate or overt edema.  Regional bones unremarkable.  IMPRESSION:  1.  Stable cardiomegaly and coarse interstitial opacities.   Original Report Authenticated By: D. Andria Rhein, MD     Assessment: He is improving on therapy for enterococcal bacteremia.  Plan: 1. Continue current antibiotics 2. Await results of repeat blood cultures, TEE and pacemaker interrogation  Cliffton Asters, MD [facility] for [facility] Disease [facility] [phone] pager   [phone] cell 05/08/2012, 11:46 AM

## 2012-05-08 NOTE — Progress Notes (Signed)
Seen and examined.  For TEE today.  I believe we are committed to 6 weeks of antibiotic therapy for enterococcus bacteremia.  The TEE will help cards and ID decide whether the pacer needs to be removed.  Await results and recs.  He feels fine.

## 2012-05-09 ENCOUNTER — Inpatient Hospital Stay (HOSPITAL_COMMUNITY): Payer: Medicare Other

## 2012-05-09 ENCOUNTER — Encounter (HOSPITAL_COMMUNITY): Payer: Self-pay | Admitting: Cardiovascular Disease

## 2012-05-09 LAB — BASIC METABOLIC PANEL
BUN: 17 mg/dL (ref 6–23)
Creatinine, Ser: 1.2 mg/dL (ref 0.50–1.35)
GFR calc Af Amer: 65 mL/min — ABNORMAL LOW (ref >90)
GFR calc non Af Amer: 56 mL/min — ABNORMAL LOW (ref >90)

## 2012-05-09 MED ORDER — SODIUM CHLORIDE 0.9 % IJ SOLN
10.0000 mL | INTRAMUSCULAR | Status: DC | PRN
  Administered 2012-05-10: 20 mL

## 2012-05-09 NOTE — Progress Notes (Signed)
Peripherally Inserted Central Catheter/Midline Placement  The IV Nurse has discussed with the patient and/or persons authorized to consent for the patient, the purpose of this procedure and the potential benefits and risks involved with this procedure.  The benefits include less needle sticks, lab draws from the catheter and patient may be discharged home with the catheter.  Risks include, but not limited to, infection, bleeding, blood clot (thrombus formation), and puncture of an artery; nerve damage and irregular heat beat.  Alternatives to this procedure were also discussed.  PICC/Midline Placement Documentation        Lisabeth Devoid 05/09/2012, 4:50 PM

## 2012-05-09 NOTE — Progress Notes (Signed)
Patient ID: [name], male   DOB: 1930-07-07, 79 y.o.   MRN: [id number] Family Medicine Teaching Service Daily Progress Note Intern Pager: [phone]  Patient name: Andrew [name] Medical record number: [id number] Date of birth: 1931/01/18 Age: 79 y.o. Gender: male  Primary Care Provider: Lucilla Edin, MD  Subjective:  No acute events overnight. Doing well this morning. Only complain is sore throat following TEE yesterday.  Objective: Temp:  [97.2 F (36.2 C)-97.9 F (36.6 C)] 97.2 F (36.2 C) (02/04 0553) Pulse Rate:  [65-68] 66  (02/04 0553) Resp:  [12-21] 16  (02/04 0553) BP: (136-196)/(58-109) 138/70 mmHg (02/04 0553) SpO2:  [95 %-100 %] 98 % (02/04 0918) Weight:  [187 lb 3.2 oz (84.913 kg)] 187 lb 3.2 oz (84.913 kg) (02/04 0553)  Exam: General: elderly man, sitting comfortably in bed, very comfortable on RA Cardiovascular: rrr, no mrg Respiratory: normal WOB, CTAB, no crackles or wheezes Abdomen: soft, NT, ND, BS+ Extremities: distal pulses 2+/symmetric, warm/well-perfused, no LE edema  Laboratory:  Lab 05/08/12 0658 05/07/12 0455 05/05/12 0655  WBC 9.1 6.9 10.0  HGB 9.5* 9.5* 9.2*  HCT 29.0* 28.1* 27.7*  PLT 252 230 180    Lab 05/09/12 0455 05/08/12 0658 05/07/12 0455 05/04/12 0635  NA 138 138 137 --  K 3.5 3.9 4.2 --  CL 99 101 102 --  CO2 31 30 26  --  BUN 17 20 29* --  CREATININE 1.20 1.25 1.36* --  CALCIUM 8.5 8.8 9.0 --  PROT -- -- -- 6.6  BILITOT -- -- -- 0.5  ALKPHOS -- -- -- 60  ALT -- -- -- 9  AST -- -- -- 18  GLUCOSE 117* 91 107* --   Micro:   Blood cultures, 1/29 @2356  and 2358: Enterococcus susceptible to Amp   Blood cultures, 2/1 - NGTD  Lactic acid, 1/29 @2356 : 1.3    05/04/2012 01:09   Color, Urine  YELLOW   APPearance  CLEAR   Specific Gravity, Urine  1.013   pH  5.5   Glucose  NEGATIVE   Bilirubin Urine  NEGATIVE   Ketones, ur  NEGATIVE   Protein  100 (A)   Urobilinogen, UA  0.2   Nitrite  NEGATIVE   Leukocytes, UA   NEGATIVE   Hgb urine dipstick  NEGATIVE   WBC, UA  0-2   RBC / HPF  3-6   Squamous Epithelial / LPF  RARE   Bacteria, UA  RARE   Casts  HYALINE CASTS (A)    Imaging/Diagnostic Tests: CXR, 1/29 @2256   Findings: Cardiomegaly. Central vascular congestion. Left chest  wall battery pack with unchanged lead tip positions. Increased  interstitial markings and peribronchial thickening. Mild right  greater than left apical scarring/pleural thickening and small  right upper lobe bulla. Otherwise, no confluent airspace opacity.  No pleural effusion or pneumothorax. No acute osseous finding.  IMPRESSION:  Increased interstitial markings and peribronchial thickening may be  chronic or acute on chronic; as can be seen with edema or  Bronchitis.  TEE: no vegetations, all 3 pacer leads seen  Assessment and Plan: Andrew [name] is a 79 y.o. male with PMH significant for ischemic CHF with reduced EF (15% in 02/2011) with biventricular pacer, CAD s/p multiple MI (multiple stenting, remote hx of CABG), hypertension, paroxysmal a-fib/a-flutter presenting with acutely worsening fatigue and weakness for the past few days and gradually for the past few weeks. Pt febrile in the ED to 101. CXR consistent with chronic/acute-on-chronic increased  pulmonary congestion/edema and possible RML early infiltrate; with elevated WBC 16.3, fever, and BNP 10.8k, concern for CAP (meeting sepsis criteria on admission; tachypnea, temp, elevated WB with possible source) with superimposed CHF exacerbation. Admitted 1/31 early AM.  #ID - Sepsis   > Enterococcus Bacteremia, Amp sensitive - 2 blood cultures positive on 1/29; possible source is pacemaker, though with TEE no signs of vegetations   - placed on 6 week course of Amp and CTX per recommendation of ID team  - TEE no vegetations, per cards team can leave pacer in    - Very appreciative of efforts of Dr. Drue Second and ID team-will await further recs for dispo planning  -  likely PICC placement for 6 weeks of antibiotics-will need home health order for this  - has negative repeat blood culture x48 hrs    #COPD - not formally classified; pt reports nearly 60 pack-year smoking history (quit 4 years ago) - Cont dulera and scheduled albuterol nebs - prednisone d/c'd given infection   #Cardiology:   > CHF with decreased EF (<15%)-   -Milrinone stopped per cardiology since CHF mproving   -cont lasix 40 daily  > Endocarditis - Enterococcus   - TEE no vegetations  - Cont Amp and CTX for bacteremia > A-fib  - continue Xarelto for a-fib, given normal renal function will restart 20 mg; does not report  daily  ASA at home   - consult to care management for home heart failure screen    - Very appreciative of cardiology recommendations and treatment   #HTN - continue to monitor BP - Cont carvedilol, hydralazine, and losartan    #Chronic anemia - ?related to kidney disease/chronic illness - Hb stable around baseline 9, here; will monitor intermittently as needed - anemia panel shows iron <10; s/p Feraheme on 2/1 - stopped Fe supplementation in setting of bacteremia   #Chronic arthritic pain/anxiety -   -ordered for Norco 5-235 q6 and Xanax 0.25 daily PRN (home doses)   #BPH - On Flomax, continue.  #GERD - On Nexium at home, 40 mg BID  -substitute Protonix while admitted   FEN/GI: heart-healthy diet, saline lock IV  Prophylaxis: Therapeutic anticoagulation (Xarelto) and PPI (Protonix for home Nexium)  Disposition: pending PICC placement and final ID recs. Anticipate discharge home to self-care with clinical improvement.  Code Status: Full code  Marikay Alar, MD 05/09/2012, 9:45 AM

## 2012-05-09 NOTE — Evaluation (Signed)
Physical Therapy Evaluation Patient Details Name: Andrew [name] MRN: [id number] DOB: [birth year] Today's Date: 05/09/2012 Time: 7829-5621 PT Time Calculation (min): 21 min  PT Assessment / Plan / Recommendation Clinical Impression  79 y/o admitted for weakness and fatigue in the setting of infection (enterococus bacteremia). Presents to PT with below impairments affecting mobility and safety. Will benefit physical therapy in the acute setting to maximize strength and independence for safe d/c home. Pt educated on HEP and importance of ambulating with nursing staff throughout hospital stay.     PT Assessment  Patient needs continued PT services    Follow Up Recommendations  No PT follow up    Does the patient have the potential to tolerate intense rehabilitation      Barriers to Discharge        Equipment Recommendations  None recommended by PT    Recommendations for Other Services     Frequency      Precautions / Restrictions Precautions Precautions: Fall Restrictions Weight Bearing Restrictions: No   Pertinent Vitals/Pain C/o chronic back pain following our session and requesting his vicotin, RN made aware      Mobility  Bed Mobility Bed Mobility: Not assessed Transfers Transfers: Sit to Stand;Stand to Sit Sit to Stand: 6: Modified independent (Device/Increase time);With upper extremity assist;From bed Stand to Sit: 6: Modified independent (Device/Increase time);With upper extremity assist;To bed Ambulation/Gait Ambulation/Gait Assistance: 4: Min guard Ambulation Distance (Feet): 250 Feet Assistive device: None Ambulation/Gait Assistance Details: gaurding for stability Gait Pattern: Step-through pattern;Trunk flexed;Shuffle;Decreased trunk rotation General Gait Details: decreased foot clearance bilaterally dragging both feet Stairs: No    Exercises General Exercises - Lower Extremity Long Arc Quad: AROM;Both;10 reps;Seated Toe Raises: AROM;Both;10  reps;Seated Heel Raises: AROM;Both;10 reps;Seated   PT Diagnosis: Abnormality of gait;Generalized weakness  PT Problem List: Decreased activity tolerance;Decreased balance PT Treatment Interventions: Gait training;Stair training;Functional mobility training;Therapeutic activities;Therapeutic exercise;Patient/family education;Balance training   PT Goals Acute Rehab PT Goals PT Goal Formulation: With patient Time For Goal Achievement: 05/16/12 Potential to Achieve Goals: Good Pt will Ambulate: >150 feet;Independently PT Goal: Ambulate - Progress: Goal set today Pt will Go Up / Down Stairs: 1-2 stairs;with rail(s);with modified independence PT Goal: Up/Down Stairs - Progress: Goal set today Pt will Perform Home Exercise Program: Independently (for generalized strengthening; pt to perform 2x/day) PT Goal: Perform Home Exercise Program - Progress: Goal set today Additional Goals Additional Goal #1: Pt will ambulate 3x/daily with nursing staff for HEP in addition to exercise program 2x/day.  PT Goal: Additional Goal #1 - Progress: Goal set today  Visit Information  Last PT Received On: 05/09/12 Assistance Needed: +1    Subjective Data  Subjective: Oh no I don't need that (when asked about home health PT) Patient Stated Goal: home   Prior Functioning  Home Living Lives With: Spouse Available Help at Discharge: Family;Available 24 hours/day Type of Home: House Home Access: Stairs to enter [employer] of Steps: 2 Entrance Stairs-Rails: Right Home Layout: One level Firefighter: Standard Home Adaptive Equipment: Walker - rolling;Straight cane Prior Function Level of Independence: Independent Able to Take Stairs?: Yes Driving: Yes Vocation: Retired Musician: No difficulties    Copywriter, advertising Overall Cognitive Status: Appears within functional limits for tasks assessed/performed Arousal/Alertness: Awake/alert Orientation Level: Appears  intact for tasks assessed Behavior During Session: [facility] for tasks performed    Extremity/Trunk Assessment Right Upper Extremity Assessment RUE ROM/Strength/Tone: [facility] for tasks assessed Left Upper Extremity Assessment LUE ROM/Strength/Tone: [facility] for tasks assessed  Right Lower Extremity Assessment RLE ROM/Strength/Tone: [facility] for tasks assessed Left Lower Extremity Assessment LLE ROM/Strength/Tone: WFL for tasks assessed   Balance Balance Balance Assessed: Yes Static Standing Balance Static Standing - Balance Support: No upper extremity supported Static Standing - Level of Assistance: 6: Modified independent (Device/Increase time) Static Standing - Comment/# of Minutes: unable to hold SLS longer than 2 seconds either leg but no LOB noted Single Leg Stance - Right Leg: 2  Single Leg Stance - Left Leg: 2   End of Session PT - End of Session Equipment Utilized During Treatment: Gait belt Activity Tolerance: Patient tolerated treatment well Patient left: in bed;with call bell/phone within reach Nurse Communication: Mobility status;Patient requests pain meds  GP     [facility] HELEN 05/09/2012, 10:30 AM

## 2012-05-09 NOTE — Progress Notes (Signed)
Patient ID: Andrew [name], male   DOB: 11-18-1930, 79 y.o.   MRN: [id number]    [facility]    Date of Admission:  05/03/2012           Day 7 antibiotics         Principal Problem:  *Community acquired pneumonia Active Problems:  UNSPECIFIED ANEMIA  COPD UNSPECIFIED  GERD  Chronic systolic heart failure  Hypertension  Ischemic cardiomyopathy  Hyperlipidemia  Coronary atherosclerosis of native coronary artery  S/P biventricular cardiac pacemaker-[employer]tr[employer]  Acute on chronic congestive heart failure  Sepsis  COPD with acute exacerbation      . albuterol  2.5 mg Nebulization Q6H  . ampicillin (OMNIPEN) IV  2 g Intravenous Q4H  . carvedilol  25 mg Oral BID WC  . cefTRIAXone (ROCEPHIN)  IV  2 g Intravenous Q12H  . furosemide  40 mg Oral Daily  . hydrALAZINE  25 mg Oral TID  . losartan  50 mg Oral BID  . mometasone-formoterol  2 puff Inhalation BID  . pantoprazole  80 mg Oral BID  . rivaroxaban  20 mg Oral Q supper  . sodium chloride  3 mL Intravenous Q12H  . Tamsulosin HCl  0.4 mg Oral BID    Subjective: He is feeling better  Objective: Temp:  [97.2 F (36.2 C)-97.9 F (36.6 C)] 97.4 F (36.3 C) (02/04 1338) Pulse Rate:  [65-69] 69  (02/04 1338) Resp:  [12-21] 17  (02/04 1338) BP: (136-196)/(58-109) 142/60 mmHg (02/04 1338) SpO2:  [95 %-100 %] 99 % (02/04 1338) Weight:  [84.913 kg (187 lb 3.2 oz)] 84.913 kg (187 lb 3.2 oz) (02/04 0553)  General: Pacemaker site looks good Skin: No scleral or conjunctival hemorrhages Lungs: Clear Cor: No murmur heard  Lab Results Lab Results  Component Value Date   WBC 9.1 05/08/2012   HGB 9.5* 05/08/2012   HCT 29.0* 05/08/2012   MCV 90.9 05/08/2012   PLT 252 05/08/2012    Lab Results  Component Value Date   CREATININE 1.20 05/09/2012   BUN 17 05/09/2012   NA 138 05/09/2012   K 3.5 05/09/2012   CL 99 05/09/2012   CO2 31 05/09/2012    Lab Results  Component Value Date   ALT 9 05/04/2012   AST 18 05/04/2012   ALKPHOS 60 05/04/2012   BILITOT 0.5 05/04/2012      Microbiology: Recent Results (from the past 240 hour(s))  CULTURE, BLOOD (ROUTINE X 2)     Status: Normal   Collection Time   05/03/12 11:56 PM      Component Value Range Status Comment   Specimen Description BLOOD LEFT ARM   Final    Special Requests BOTTLES DRAWN AEROBIC AND ANAEROBIC 8CC   Final    Culture  Setup Time 05/04/2012 10:03   Final    Culture     Final    Value: ENTEROCOCCUS SPECIES     Note: SUSCEPTIBILITIES PERFORMED ON PREVIOUS CULTURE WITHIN THE LAST 5 DAYS.     Note: Gram Stain Report Called to,Read Back By and Verified With: ALICIA WALKER ON 05/04/2012 AT 11:12P BY WILEJ   Report Status 05/07/2012 FINAL   Final   CULTURE, BLOOD (ROUTINE X 2)     Status: Normal   Collection Time   05/03/12 11:58 PM      Component Value Range Status Comment   Specimen Description BLOOD LEFT ARM   Final    Special Requests BOTTLES DRAWN AEROBIC AND  ANAEROBIC 10CC   Final    Culture  Setup Time 05/04/2012 10:03   Final    Culture     Final    Value: ENTEROCOCCUS SPECIES     Note: COMBINATION THERAPY OF HIGH DOSE AMPICILLIN OR VANCOMYCIN, PLUS AN AMINOGLYCOSIDE, IS USUALLY INDICATED FOR SERIOUS ENTEROCOCCAL INFECTIONS.     Note: Gram Stain Report Called to,Read Back By and Verified With: ALICIA WALKER ON 05/04/2012 AT 11:12P BY WILEJ   Report Status 05/07/2012 FINAL   Final    Organism ID, Bacteria ENTEROCOCCUS SPECIES   Final   CULTURE, BLOOD (ROUTINE X 2)     Status: Normal (Preliminary result)   Collection Time   05/06/12  6:20 PM      Component Value Range Status Comment   Specimen Description BLOOD LEFT ARM   Final    Special Requests BOTTLES DRAWN AEROBIC AND ANAEROBIC 10CC   Final    Culture  Setup Time 05/07/2012 03:50   Final    Culture     Final    Value:        BLOOD CULTURE RECEIVED NO GROWTH TO DATE CULTURE WILL BE HELD FOR 5 DAYS BEFORE ISSUING A FINAL NEGATIVE REPORT   Report Status PENDING   Incomplete   CULTURE, BLOOD  (ROUTINE X 2)     Status: Normal (Preliminary result)   Collection Time   05/06/12  6:26 PM      Component Value Range Status Comment   Specimen Description BLOOD LEFT HAND   Final    Special Requests BOTTLES DRAWN AEROBIC AND ANAEROBIC 10CC   Final    Culture  Setup Time 05/07/2012 03:50   Final    Culture     Final    Value:        BLOOD CULTURE RECEIVED NO GROWTH TO DATE CULTURE WILL BE HELD FOR 5 DAYS BEFORE ISSUING A FINAL NEGATIVE REPORT   Report Status PENDING   Incomplete    Assessment: No evidence of endocarditis on his pacemaker leads were native valves were noted on TEE. Plan is to leave his pacemaker and and treat him with a minimum of 6 weeks of combination antibiotic therapy to try to cure his enterococcal bacteremia without having to remove the pacemaker. He will need a double-lumen PICC so that he can receive IV ampicillin through a programmable pump and still get twice daily ceftriaxone as an outpatient.  Plan: 1. Continue ceftriaxone and ampicillin for 5 more weeks 2. Double-lumen PICC placement 3. I will arrange followup in her clinic  Cliffton Asters, MD [facility] for Infectious Disease [facility] [phone] pager   [phone] cell 05/09/2012, 3:33 PM

## 2012-05-09 NOTE — Progress Notes (Signed)
Spoke with pt and wife regarding [facility] home IV antibiotic services.  Both agree to services.

## 2012-05-09 NOTE — Progress Notes (Signed)
PROGRESS NOTE  Subjective:   Pt has a hx of CHF and has an ICD.  He was recently admitted with weakness and fatigue and was found to have an enteriococcus sepsis.   TEE showed no evidence of endocarditis.  No vegetations on pacer leads    Objective:    Vital Signs:   Temp:  [97.2 F (36.2 C)-97.9 F (36.6 C)] 97.2 F (36.2 C) (02/04 0553) Pulse Rate:  [65-68] 66  (02/04 0553) Resp:  [12-21] 16  (02/04 0553) BP: (136-196)/(58-109) 138/70 mmHg (02/04 0553) SpO2:  [95 %-100 %] 99 % (02/04 0553) Weight:  [187 lb 3.2 oz (84.913 kg)] 187 lb 3.2 oz (84.913 kg) (02/04 0553)  Last BM Date: 05/07/12   24-hour weight change: Weight change: -1 lb 1.6 oz (-0.499 kg)  Weight trends: Filed Weights   05/07/12 0456 05/08/12 0300 05/09/12 0553  Weight: 189 lb 3.2 oz (85.821 kg) 188 lb 4.8 oz (85.412 kg) 187 lb 3.2 oz (84.913 kg)    Intake/Output:  02/03 0701 - 02/04 0700 In: 1756 [P.O.:656; I.V.:150; IV Piggyback:950] Out: 7846 [NGEXB:2841; Stool:2]     Physical Exam: BP 138/70  Pulse 66  Temp 97.2 F (36.2 C) (Oral)  Resp 16  Ht 6' (1.829 m)  Wt 187 lb 3.2 oz (84.913 kg)  BMI 25.39 kg/m2  SpO2 99%  General: Vital signs reviewed and noted.   Head: Normocephalic, atraumatic.  Eyes: conjunctivae/corneas clear.  EOM's intact.   Throat: normal  Neck:  normal  Lungs:   clear  Heart:  RR  Abdomen:  Soft, non-tender, non-distended   Extremities: Trace  edema   Neurologic: A&O X3, CN II - XII are grossly intact.  Psych: Normal     Labs: BMET:  Basename 05/09/12 0455 05/08/12 0658  NA 138 138  K 3.5 3.9  CL 99 101  CO2 31 30  GLUCOSE 117* 91  BUN 17 20  CREATININE 1.20 1.25  CALCIUM 8.5 8.8  MG -- --  PHOS -- --    Liver function tests: No results found for this basename: AST:2,ALT:2,ALKPHOS:2,BILITOT:2,PROT:2,ALBUMIN:2 in the last 72 hours No results found for this basename: LIPASE:2,AMYLASE:2 in the last 72 hours  CBC:  Basename 05/08/12 0658 05/07/12  0455  WBC 9.1 6.9  NEUTROABS -- --  HGB 9.5* 9.5*  HCT 29.0* 28.1*  MCV 90.9 89.8  PLT 252 230    Cardiac Enzymes: No results found for this basename: CKTOTAL:4,CKMB:4,TROPONINI:4 in the last 72 hours  Coagulation Studies: No results found for this basename: LABPROT:5,INR:5 in the last 72 hours  Other:   Other results:  Tele: V paced  Medications:    Infusions:    Scheduled Medications:    . albuterol  2.5 mg Nebulization Q6H  . ampicillin (OMNIPEN) IV  2 g Intravenous Q4H  . carvedilol  25 mg Oral BID WC  . cefTRIAXone (ROCEPHIN)  IV  2 g Intravenous Q12H  . furosemide  40 mg Oral Daily  . hydrALAZINE  25 mg Oral TID  . losartan  50 mg Oral BID  . mometasone-formoterol  2 puff Inhalation BID  . pantoprazole  80 mg Oral BID  . rivaroxaban  20 mg Oral Q supper  . sodium chloride  3 mL Intravenous Q12H  . Tamsulosin HCl  0.4 mg Oral BID    Assessment/ Plan:    Community acquired pneumonia (05/04/2012)  UNSPECIFIED ANEMIA (05/05/2009)  COPD UNSPECIFIED (12/02/2009)  Chronic systolic heart failure , due to ischemic cardiomyopathy  Hypertension ()  Hyperlipidemia (11/26/2010)  Coronary atherosclerosis of native coronary artery (01/15/2011)  S/P biventricular cardiac pacemaker-Medtronic (06/17/2011)  Sepsis (05/05/2012) No evidence of vegetation.  Given the fact that he has a pacer in, I would prefer a long course of antibiotics .  We will see him in the office in several weeks to make any adjustments to his meds.  Follow up with Dr. Antoine Poche and Dr. Graciela Husbands.   COPD with acute exacerbation (05/06/2012)     Disposition:  Length of Stay: 6  Vesta Mixer, Montez Hageman., MD, [facility] 05/09/2012, 8:23 AM Office [phone] Pager [phone]

## 2012-05-09 NOTE — Progress Notes (Signed)
Seen, examined and discussed in team rounds.  Agree with Dr. Purvis Sheffield management and documentation.

## 2012-05-10 LAB — BASIC METABOLIC PANEL
BUN: 14 mg/dL (ref 6–23)
Calcium: 8.7 mg/dL (ref 8.4–10.5)
Creatinine, Ser: 1.2 mg/dL (ref 0.50–1.35)
GFR calc Af Amer: 65 mL/min — ABNORMAL LOW (ref >90)

## 2012-05-10 LAB — CBC
MCHC: 32.2 g/dL (ref 30.0–36.0)
MCV: 90.7 fL (ref 78.0–100.0)
Platelets: 289 10*3/uL (ref 150–400)
RDW: 13.9 % (ref 11.5–15.5)
WBC: 9.2 10*3/uL (ref 4.0–10.5)

## 2012-05-10 MED ORDER — SODIUM CHLORIDE 0.9 % IV SOLN: 2.0000 g | INTRAVENOUS | Status: DC

## 2012-05-10 MED ORDER — DEXTROSE 5 % IV SOLN: 2.0000 g | Freq: Two times a day (BID) | INTRAVENOUS | Status: DC

## 2012-05-10 MED ORDER — MOMETASONE FURO-FORMOTEROL FUM 100-5 MCG/ACT IN AERO: 2.0000 | INHALATION_SPRAY | Freq: Two times a day (BID) | RESPIRATORY_TRACT | Status: DC

## 2012-05-10 NOTE — Progress Notes (Signed)
Patient ID: HAJI DELAINE, male   DOB: June 20, 1930, 79 y.o.   MRN: [id number] Family Medicine Teaching Service Daily Progress Note Intern Pager: [phone]  Patient name: Andrew [name] Medical record number: [id number] Date of birth: 1931/03/22 Age: 79 y.o. Gender: male  Primary Care Provider: Lucilla Edin, MD  Subjective:  Patient doing well. States is a little wheezy this morning, but that it is time breathing treatment. Looking forward to going home.  Objective: Temp:  [97.4 F (36.3 C)-98.3 F (36.8 C)] 98.3 F (36.8 C) (02/05 0556) Pulse Rate:  [65-76] 76  (02/05 0556) Resp:  [16-18] 16  (02/05 0556) BP: (121-169)/(60-75) 121/72 mmHg (02/05 0556) SpO2:  [93 %-100 %] 93 % (02/05 0556) Weight:  [187 lb 4.8 oz (84.959 kg)] 187 lb 4.8 oz (84.959 kg) (02/05 0556)  Exam: General: elderly man, sitting comfortably in bed, very comfortable on RA Cardiovascular: rrr, no mrg Respiratory: normal WOB, CTAB, minimal wheezing on the left side Abdomen: soft, NT, ND, BS+ Extremities: distal pulses 2+/symmetric, warm/well-perfused, no LE edema, PICC line in place in right arm  Laboratory:  Lab 05/10/12 0520 05/08/12 0658 05/07/12 0455  WBC 9.2 9.1 6.9  HGB 9.4* 9.5* 9.5*  HCT 29.2* 29.0* 28.1*  PLT 289 252 230    Lab 05/10/12 0520 05/09/12 0455 05/08/12 0658 05/04/12 0635  NA 137 138 138 --  K 3.5 3.5 3.9 --  CL 100 99 101 --  CO2 30 31 30  --  BUN 14 17 20  --  CREATININE 1.20 1.20 1.25 --  CALCIUM 8.7 8.5 8.8 --  PROT -- -- -- 6.6  BILITOT -- -- -- 0.5  ALKPHOS -- -- -- 60  ALT -- -- -- 9  AST -- -- -- 18  GLUCOSE 89 117* 91 --   Micro:   Blood cultures, 1/29 @2356  and 2358: Enterococcus susceptible to Amp   Blood cultures, 2/1 - NGTD  Lactic acid, 1/29 @2356 : 1.3    05/04/2012 01:09   Color, Urine  YELLOW   APPearance  CLEAR   Specific Gravity, Urine  1.013   pH  5.5   Glucose  NEGATIVE   Bilirubin Urine  NEGATIVE   Ketones, ur  NEGATIVE   Protein  100 (A)    Urobilinogen, UA  0.2   Nitrite  NEGATIVE   Leukocytes, UA  NEGATIVE   Hgb urine dipstick  NEGATIVE   WBC, UA  0-2   RBC / HPF  3-6   Squamous Epithelial / LPF  RARE   Bacteria, UA  RARE   Casts  HYALINE CASTS (A)    Imaging/Diagnostic Tests: CXR, 1/29 @2256   Findings: Cardiomegaly. Central vascular congestion. Left chest  wall battery pack with unchanged lead tip positions. Increased  interstitial markings and peribronchial thickening. Mild right  greater than left apical scarring/pleural thickening and small  right upper lobe bulla. Otherwise, no confluent airspace opacity.  No pleural effusion or pneumothorax. No acute osseous finding.  IMPRESSION:  Increased interstitial markings and peribronchial thickening may be  chronic or acute on chronic; as can be seen with edema or  Bronchitis.  TEE: no vegetations, all 3 pacer leads seen  Assessment and Plan: Andrew [name] is a 79 y.o. male with PMH significant for ischemic CHF with reduced EF (15% in 02/2011) with biventricular pacer, CAD s/p multiple MI (multiple stenting, remote hx of CABG), hypertension, paroxysmal a-fib/a-flutter presenting with acutely worsening fatigue and weakness for the past few days and gradually for the  past few weeks. Pt febrile in the ED to 101. CXR consistent with chronic/acute-on-chronic increased pulmonary congestion/edema and possible RML early infiltrate; with elevated WBC 16.3, fever, and BNP 10.8k, concern for CAP (meeting sepsis criteria on admission; tachypnea, temp, elevated WB with possible source) with superimposed CHF exacerbation. Admitted 1/31 early AM.  #ID - Sepsis   > Enterococcus Bacteremia, Amp sensitive - 2 blood cultures positive on 1/29; possible source is pacemaker, though with TEE no signs of vegetations   - placed on 6 week course of Amp and CTX per recommendation of ID team  - TEE no vegetations, per cards team can leave pacer in    - Very appreciative of efforts of Dr. Drue Second  and ID team-double lumen PICC placed and patient  to have 6 weeks of IV antibiotics  - has negative repeat blood culture x72 hrs    #COPD - not formally classified; pt reports nearly 60 pack-year smoking history (quit 4 years ago) - Cont dulera and scheduled albuterol nebs - prednisone d/c'd given infection   #Cardiology:   > CHF with decreased EF (<15%)-   -Milrinone stopped per cardiology since CHF improving   -cont lasix 40 daily  > A-fib  - continue Xarelto 20 mg daily for a-fib;  does not report daily ASA at home   - consult to care management for home heart failure screen    - Very appreciative of cardiology recommendations and treatment   #HTN - continue to monitor BP-has been stable - Cont carvedilol, hydralazine, and losartan    #Chronic anemia - ?related to kidney disease/chronic illness - Hb stable around baseline 9, here; will monitor intermittently as needed - anemia panel shows iron <10; s/p Feraheme on 2/1 - stopped Fe supplementation in setting of bacteremia   #Chronic arthritic pain/anxiety -   -ordered for Norco 5-235 q6 and Xanax 0.25 daily PRN (home doses)   #BPH - On Flomax, continue.  #GERD - On Nexium at home, 40 mg BID  -substitute Protonix while admitted   FEN/GI: heart-healthy diet, saline lock IV  Prophylaxis: Therapeutic anticoagulation (Xarelto) and PPI (Protonix for home Nexium)  Disposition: likely discharge today if home health is able to set up antibiotics  Code Status: Full code  Marikay Alar, MD 05/10/2012, 9:11 AM

## 2012-05-10 NOTE — Progress Notes (Signed)
Seen and examined.  Discussed with Dr. Sonnenberg.  Agree with his management and documentation.   

## 2012-05-10 NOTE — Progress Notes (Signed)
Monitor tech reported patient has 11 bt run of Kirby Funk; MD notified; will continue to monitor patient. Lorretta Harp RN

## 2012-05-10 NOTE — Progress Notes (Signed)
Patient ID: Andrew [name], male   DOB: 06-04-1930, 79 y.o.   MRN: [id number]    [facility]    Date of Admission:  05/03/2012   Total days of antibiotics 8         Principal Problem:  *Community acquired pneumonia Active Problems:  UNSPECIFIED ANEMIA  COPD UNSPECIFIED  GERD  Chronic systolic heart failure  Hypertension  Ischemic cardiomyopathy  Hyperlipidemia  Coronary atherosclerosis of native coronary artery  S/P biventricular cardiac pacemaker-[employer]tronic  Acute on chronic congestive heart failure  Sepsis  COPD with acute exacerbation      . albuterol  2.5 mg Nebulization Q6H  . ampicillin (OMNIPEN) IV  2 g Intravenous Q4H  . carvedilol  25 mg Oral BID WC  . cefTRIAXone (ROCEPHIN)  IV  2 g Intravenous Q12H  . furosemide  40 mg Oral Daily  . hydrALAZINE  25 mg Oral TID  . losartan  50 mg Oral BID  . mometasone-formoterol  2 puff Inhalation BID  . pantoprazole  80 mg Oral BID  . rivaroxaban  20 mg Oral Q supper  . sodium chloride  3 mL Intravenous Q12H  . Tamsulosin HCl  0.4 mg Oral BID    Subjective: Overall, he is feeling much better. He has had some intermittent diarrhea since this illness began that predates his antibiotic therapy.  Objective: Temp:  [97.1 F (36.2 C)-98.3 F (36.8 C)] 97.1 F (36.2 C) (02/05 1359) Pulse Rate:  [65-76] 65  (02/05 1359) Resp:  [16-18] 17  (02/05 1359) BP: (121-169)/(57-75) 143/62 mmHg (02/05 1359) SpO2:  [93 %-100 %] 100 % (02/05 1511) Weight:  [84.959 kg (187 lb 4.8 oz)] 84.959 kg (187 lb 4.8 oz) (02/05 0556)  General: He is alert, comfortable and in good spirits Skin: No splinter or conjunctival hemorrhages. New right arm PICC Lungs: Clear Cor: Regular S1-S2 no murmurs. Pacemaker site appears normal Abdomen: Soft and nontender  Lab Results Lab Results  Component Value Date   WBC 9.2 05/10/2012   HGB 9.4* 05/10/2012   HCT 29.2* 05/10/2012   MCV 90.7 05/10/2012   PLT 289 05/10/2012     Microbiology: Recent Results (from the past 240 hour(s))  CULTURE, BLOOD (ROUTINE X 2)     Status: Normal   Collection Time   05/03/12 11:56 PM      Component Value Range Status Comment   Specimen Description BLOOD LEFT ARM   Final    Special Requests BOTTLES DRAWN AEROBIC AND ANAEROBIC 8CC   Final    Culture  Setup Time 05/04/2012 10:03   Final    Culture     Final    Value: ENTEROCOCCUS SPECIES     Note: SUSCEPTIBILITIES PERFORMED ON PREVIOUS CULTURE WITHIN THE LAST 5 DAYS.     Note: Gram Stain Report Called to,Read Back By and Verified With: ALICIA WALKER ON 05/04/2012 AT 11:12P BY WILEJ   Report Status 05/07/2012 FINAL   Final   CULTURE, BLOOD (ROUTINE X 2)     Status: Normal   Collection Time   05/03/12 11:58 PM      Component Value Range Status Comment   Specimen Description BLOOD LEFT ARM   Final    Special Requests BOTTLES DRAWN AEROBIC AND ANAEROBIC 10CC   Final    Culture  Setup Time 05/04/2012 10:03   Final    Culture     Final    Value: ENTEROCOCCUS SPECIES     Note: COMBINATION THERAPY OF HIGH  DOSE AMPICILLIN OR VANCOMYCIN, PLUS AN AMINOGLYCOSIDE, IS USUALLY INDICATED FOR SERIOUS ENTEROCOCCAL INFECTIONS.     Note: Gram Stain Report Called to,Read Back By and Verified With: ALICIA WALKER ON 05/04/2012 AT 11:12P BY WILEJ   Report Status 05/07/2012 FINAL   Final    Organism ID, Bacteria ENTEROCOCCUS SPECIES   Final   CULTURE, BLOOD (ROUTINE X 2)     Status: Normal (Preliminary result)   Collection Time   05/06/12  6:20 PM      Component Value Range Status Comment   Specimen Description BLOOD LEFT ARM   Final    Special Requests BOTTLES DRAWN AEROBIC AND ANAEROBIC 10CC   Final    Culture  Setup Time 05/07/2012 03:50   Final    Culture     Final    Value:        BLOOD CULTURE RECEIVED NO GROWTH TO DATE CULTURE WILL BE HELD FOR 5 DAYS BEFORE ISSUING A FINAL NEGATIVE REPORT   Report Status PENDING   Incomplete   CULTURE, BLOOD (ROUTINE X 2)     Status: Normal (Preliminary  result)   Collection Time   05/06/12  6:26 PM      Component Value Range Status Comment   Specimen Description BLOOD LEFT HAND   Final    Special Requests BOTTLES DRAWN AEROBIC AND ANAEROBIC 10CC   Final    Culture  Setup Time 05/07/2012 03:50   Final    Culture     Final    Value:        BLOOD CULTURE RECEIVED NO GROWTH TO DATE CULTURE WILL BE HELD FOR 5 DAYS BEFORE ISSUING A FINAL NEGATIVE REPORT   Report Status PENDING   Incomplete    Assessment: His enterococcal bacteremia is clearing on therapy. The plan is to try at least 6 weeks of combination antibiotic therapy with the hope that we can cure is bacteremia without having to remove his pacemaker. I suspect that his diarrhea may be related to the bacteremia I agree with checking a C. difficile PCR if the diarrhea persists. He is not having further diarrhea today I would be comfortable sending him home with outpatient followup.  Plan: 1. Continue ceftriaxone and ampicillin 2. Home soon with followup in our clinic  Cliffton Asters, MD [facility] [phone] pager   [phone] cell 05/10/2012, 4:24 PM

## 2012-05-11 ENCOUNTER — Telehealth: Payer: Self-pay | Admitting: Cardiology

## 2012-05-11 NOTE — Telephone Encounter (Signed)
New Problem     Pt was recently discharged from hospital. Was told to follow up with Dr. Elease Hashimoto in two weeks. Dr. Elease Hashimoto does not have availability until early March. Pt already has an appt with [name], they plan to just follow up with her and if necessary will make an appt with Dr. Elease Hashimoto afterwards.

## 2012-05-11 NOTE — Telephone Encounter (Signed)
Do you mean Dr Antoine Poche?

## 2012-05-13 LAB — CULTURE, BLOOD (ROUTINE X 2)

## 2012-05-15 ENCOUNTER — Other Ambulatory Visit: Payer: Self-pay | Admitting: Cardiology

## 2012-05-15 NOTE — Telephone Encounter (Signed)
..   Requested Prescriptions   Pending Prescriptions Disp Refills  . carvedilol (COREG) 25 MG tablet [Pharmacy Med Name: CARVEDILOL 25 MG TABLET] 120 tablet 1    Sig: TAKE 1 TABLET (25 MG TOTAL) BY MOUTH 2 (TWO) TIMES DAILY WITH A MEAL.

## 2012-05-24 ENCOUNTER — Encounter: Payer: Self-pay | Admitting: Infectious Disease

## 2012-05-24 ENCOUNTER — Ambulatory Visit (INDEPENDENT_AMBULATORY_CARE_PROVIDER_SITE_OTHER): Payer: Medicare Other | Admitting: Infectious Disease

## 2012-05-24 VITALS — BP 145/75 | HR 72 | Temp 98.3°F | Ht 72.0 in | Wt 194.0 lb

## 2012-05-24 DIAGNOSIS — B952 Enterococcus as the cause of diseases classified elsewhere: Secondary | ICD-10-CM

## 2012-05-24 DIAGNOSIS — Z950 Presence of cardiac pacemaker: Secondary | ICD-10-CM

## 2012-05-24 DIAGNOSIS — R7881 Bacteremia: Secondary | ICD-10-CM

## 2012-05-24 DIAGNOSIS — I509 Heart failure, unspecified: Secondary | ICD-10-CM

## 2012-05-24 DIAGNOSIS — I5022 Chronic systolic (congestive) heart failure: Secondary | ICD-10-CM

## 2012-05-24 DIAGNOSIS — R5381 Other malaise: Secondary | ICD-10-CM

## 2012-05-24 DIAGNOSIS — Z45018 Encounter for adjustment and management of other part of cardiac pacemaker: Secondary | ICD-10-CM

## 2012-05-24 NOTE — Progress Notes (Signed)
Subjective:    Patient ID: Andrew [name], male    DOB: 1930-10-29, 79 y.o.   MRN: [id number]  HPI  79 year old man with BiV PM and Amp S enterococcal bacteremia who was treated as an inpatient with high-dose ampicillin with ceftriaxone. Transesophageal echocardiogram failed to show evidence of endocarditis or vegetations on the pacemaker wires themselves.Pacemaker was not removed blood cultures did clear on the first of February. Patient was discharged to home on high-dose ampicillin and vancomycin.  Apparently there was an issue with the ampicillin which needs to be mixed fresh every 3 days. What I understand there had been plans for the wife to do some of the mixing but the wife herself has a severe penicillin allergy with hives. Because of this concern the patient had been changed to high-dose vancomycin and continued high-dose ceftriaxone by my partner Dr. Orvan Falconer while he sought a more workable solution to abx. [facility] as well as with Dr. Orvan Falconer today. [facility] now states that they can premix all the patient's ampicillin and delivered to him every 3 days. He will be on a continuous pump of ampicillin missing 12 g of it daily. Vancomycin will be stopped. High-dose ceftriaxone 2 g every 12 will be continued.   The patient has had symptoms of malaise and has had dyspnea on exertion in particular making me concerned that he may have some component of heart failure may be a bit worse I've encouraged him to followup with his cardiologist.  His serum creatinine did bump to 1.23 while on vancomycin while the dose was adjusted. Otherwise safety labs were stable with stable hemoglobin hematocrit white blood cell count.  I spent greater than 45 minutes with the patient including greater than 50% of time in face to face counsel of the patient and in coordination of their care.    Review of Systems  Constitutional: Positive for fatigue. Negative  for fever, chills, diaphoresis, activity change, appetite change and unexpected weight change.  HENT: Negative for congestion, sore throat, rhinorrhea, sneezing, trouble swallowing and sinus pressure.   Eyes: Negative for photophobia and visual disturbance.  Respiratory: Positive for shortness of breath. Negative for cough, chest tightness, wheezing and stridor.   Cardiovascular: Negative for chest pain, palpitations and leg swelling.  Gastrointestinal: Negative for nausea, vomiting, abdominal pain, diarrhea, constipation, blood in stool, abdominal distention and anal bleeding.  Genitourinary: Negative for dysuria, hematuria, flank pain and difficulty urinating.  Musculoskeletal: Negative for myalgias, back pain, joint swelling, arthralgias and gait problem.  Skin: Negative for color change, pallor, rash and wound.  Neurological: Negative for dizziness, tremors, weakness and light-headedness.  Hematological: Negative for adenopathy. Does not bruise/bleed easily.  Psychiatric/Behavioral: Negative for behavioral problems, confusion, sleep disturbance, dysphoric mood, decreased concentration and agitation.       Objective:   Physical Exam  Constitutional: He is oriented to person, place, and time. He appears well-nourished. No distress.  HENT:  Head: Normocephalic and atraumatic.  Mouth/Throat: Oropharynx is clear and moist. No oropharyngeal exudate.  Eyes: Conjunctivae and EOM are normal. No scleral icterus.  Neck: Normal range of motion. Neck supple.  Cardiovascular: Normal rate, regular rhythm and normal heart sounds.  Exam reveals no gallop and no friction rub.   No murmur heard. Pulmonary/Chest: Effort normal and breath sounds normal. No respiratory distress. He has no wheezes. He has no rales. He exhibits no tenderness.  Abdominal: He exhibits no distension. There is no tenderness. There  is no rebound and no guarding.  Musculoskeletal: He exhibits no edema and no tenderness.    Lymphadenopathy:    He has no cervical adenopathy.  Neurological: He is alert and oriented to person, place, and time. He exhibits normal muscle tone. Coordination normal.  Skin: Skin is warm and dry. He is not diaphoretic. No erythema. No pallor.     Psychiatric: He has a normal mood and affect. His behavior is normal. Judgment and thought content normal.          Assessment & Plan:  Ampicillin sensitive enterococcal bacteremia with pacemaker present: Unchanged the patient back to high dose ampicillin with high-dose ceftriaxone. We will continue on this course through March the 14th which will give the patient 6 weeks of IV abx. I will then have him come in and have repeat blood cultures done OFF abx to reassure ourselves that he is cleared his bacteremia. Certainly he has recurrence of his enterococcal bacteremia will need extraction of the pacemaker performed.   malaise: Not clear what the cause of this is, does have some other symptoms suggestive of heart failure with his dyspnea on exertion.  CHF: Again some of his symptoms of dyspnea on exertion make me concerned that he may have some degree of worsening of his heart failure and encouraged to followup with his cardiologist.

## 2012-05-28 ENCOUNTER — Other Ambulatory Visit: Payer: Self-pay | Admitting: Physician Assistant

## 2012-05-29 ENCOUNTER — Telehealth: Payer: Self-pay | Admitting: Infectious Disease

## 2012-05-29 ENCOUNTER — Telehealth: Payer: Self-pay | Admitting: *Deleted

## 2012-05-29 ENCOUNTER — Encounter: Payer: Self-pay | Admitting: Nurse Practitioner

## 2012-05-29 ENCOUNTER — Telehealth: Payer: Self-pay

## 2012-05-29 ENCOUNTER — Ambulatory Visit (INDEPENDENT_AMBULATORY_CARE_PROVIDER_SITE_OTHER): Payer: Medicare Other | Admitting: Nurse Practitioner

## 2012-05-29 VITALS — BP 142/60 | HR 70 | Resp 24 | Ht 72.0 in | Wt 193.8 lb

## 2012-05-29 DIAGNOSIS — R0989 Other specified symptoms and signs involving the circulatory and respiratory systems: Secondary | ICD-10-CM

## 2012-05-29 DIAGNOSIS — R0602 Shortness of breath: Secondary | ICD-10-CM

## 2012-05-29 DIAGNOSIS — R0609 Other forms of dyspnea: Secondary | ICD-10-CM

## 2012-05-29 DIAGNOSIS — R0600 Dyspnea, unspecified: Secondary | ICD-10-CM

## 2012-05-29 LAB — CBC WITH DIFFERENTIAL/PLATELET
Basophils Absolute: 0 10*3/uL (ref 0.0–0.1)
Basophils Relative: 0.6 % (ref 0.0–3.0)
Eosinophils Absolute: 0.2 10*3/uL (ref 0.0–0.7)
Eosinophils Relative: 3.2 % (ref 0.0–5.0)
HCT: 30.1 % — ABNORMAL LOW (ref 39.0–52.0)
Hemoglobin: 9.9 g/dL — ABNORMAL LOW (ref 13.0–17.0)
Lymphocytes Relative: 7.1 % — ABNORMAL LOW (ref 12.0–46.0)
Lymphs Abs: 0.4 10*3/uL — ABNORMAL LOW (ref 0.7–4.0)
MCHC: 32.9 g/dL (ref 30.0–36.0)
MCV: 89.8 fl (ref 78.0–100.0)
Monocytes Absolute: 0.7 10*3/uL (ref 0.1–1.0)
Monocytes Relative: 12.4 % — ABNORMAL HIGH (ref 3.0–12.0)
Neutro Abs: 4 10*3/uL (ref 1.4–7.7)
Neutrophils Relative %: 76.7 % (ref 43.0–77.0)
Platelets: 254 10*3/uL (ref 150.0–400.0)
RBC: 3.35 Mil/uL — ABNORMAL LOW (ref 4.22–5.81)
RDW: 15.6 % — ABNORMAL HIGH (ref 11.5–14.6)
WBC: 5.2 10*3/uL (ref 4.5–10.5)

## 2012-05-29 LAB — BASIC METABOLIC PANEL
BUN: 10 mg/dL (ref 6–23)
CO2: 31 mEq/L (ref 19–32)
Calcium: 8.5 mg/dL (ref 8.4–10.5)
Chloride: 93 mEq/L — ABNORMAL LOW (ref 96–112)
Creatinine, Ser: 1.3 mg/dL (ref 0.4–1.5)
GFR: 59.08 mL/min — ABNORMAL LOW (ref >60.00)
Glucose, Bld: 107 mg/dL — ABNORMAL HIGH (ref 70–99)
Potassium: 2.7 mEq/L — CL (ref 3.5–5.1)
Sodium: 134 mEq/L — ABNORMAL LOW (ref 135–145)

## 2012-05-29 LAB — BRAIN NATRIURETIC PEPTIDE: Pro B Natriuretic peptide (BNP): 1411 pg/mL — ABNORMAL HIGH (ref 0.0–100.0)

## 2012-05-29 NOTE — Telephone Encounter (Signed)
Andrew [name] is the patient taking Kdur right now? And do we have a creatinine with labs?

## 2012-05-29 NOTE — Telephone Encounter (Signed)
[facility] called to give a critical lab on this patient . They advised they got his potassium at 2.6. Advised will inform the doctor.

## 2012-05-29 NOTE — Telephone Encounter (Signed)
Debbie, Just wanted to make sure you talked with him about his potassium replacement.

## 2012-05-29 NOTE — Patient Instructions (Addendum)
Increase your Lasix to 80 mg in the am and 80 mg in early afternoon  If you are not doing better by Thursday, call us and we will be sending you on to the hospital for admission  We need to check labs today  Call the [facility] office at [phone] if you have any questions, problems or concerns.

## 2012-05-29 NOTE — Telephone Encounter (Signed)
[facility] called with abnormal K+ on pt of 2.7.  Per Norma Fredrickson, pt was told to increase his potassium to 40 meq tonight, 20 meq tid on Wed and Thurs (60 meq total for each day).  He is to recheck his K+ on Thursday.  He understands and agrees.

## 2012-05-29 NOTE — Progress Notes (Signed)
Andrew [name] Date of Birth: [birth year] Medical Record #[id number]  History of Present Illness: Andrew [name] is seen back today for a post hospital visit. He is seen for Dr. Antoine [name]. He has multiple issues which includes chronic systolic heart failure. Now with BiVdevice in place. On Xarelto for his atrial flutter and off of amiodarone due to increased LFT's in the remote past. Has COPD. On oxygen at night. Has known CAD with remote MIs and multiple stents. EF is 15% per echo back in 2012. Other issues include HTN, HLD, CRI.   I saw him back at the end of January. Seemed to be doing ok. Was admitted a couple of days later with sepsis. Had ampicillin sensitive enterococcal bacteremia. On continuous ampicillin and Rocephin. Followed by ID. His TEE was negative for a vegetation on his device.   He comes back today. He is here with his neighbor. He is not doing well. Has been home about 2 weeks. Progressively getting worse. Can't do hardly anything. Very fatigued with minimal exertion and short of breath. His weight is unchanged. Some minor swelling of his ankles. Not checking his temperature. Doesn't think he is voiding as much as he should be. Some cough but not productive. He is frustrated.   Current Outpatient Prescriptions on File Prior to Visit  Medication Sig Dispense Refill  . albuterol (2.5 MG/3ML) 0.083% NEBU 3 mL, albuterol (5 MG/ML) 0.5% NEBU 0.5 mL Inhale 2.5 mg into the lungs 3 (three) times daily as needed. For shortness of breath.      . Albuterol Sulfate (PROAIR HFA IN) Inhale 2 puffs into the lungs every 8 (eight) hours as needed. Shortness of breath.      . ALPRAZolam (XANAX) 0.25 MG tablet Take 0.25 mg by mouth daily as needed. For anxiety.      . carvedilol (COREG) 25 MG tablet TAKE 1 TABLET (25 MG TOTAL) BY MOUTH 2 (TWO) TIMES DAILY WITH A MEAL.  120 tablet  1  . dextrose 5 % SOLN 50 mL with cefTRIAXone 2 G SOLR 2 g Inject 2 g into the vein every 12 (twelve) hours.  2 g  10  .  esomeprazole (NEXIUM) 40 MG capsule Take 40 mg by mouth 2 (two) times daily.      . fluticasone (FLONASE) 50 MCG/ACT nasal spray Place 1 spray into both nostrils daily.      . furosemide (LASIX) 40 MG tablet Take 40-80 mg by mouth daily. Takes 1 tablet everyday; then based on weight gain from previous day, if >2 lbs. Take 1 additional tablet as needed.      . hydrALAZINE (APRESOLINE) 25 MG tablet Take 25 mg by mouth 3 (three) times daily.      [name] HYDROcodone-acetaminophen (VICODIN) 5-500 MG per tablet Take 1 tablet by mouth every 6 (six) hours as needed. For pain.      [name] losartan (COZAAR) 50 MG tablet Take 50 mg by mouth 2 (two) times daily.      . nitroGLYCERIN (NITROSTAT) 0.4 MG SL tablet Place 1 tablet (0.4 mg total) under the tongue every 5 (five) minutes as needed for chest pain.  25 tablet  6  . potassium chloride (K-DUR) 10 MEQ tablet Take 10 mEq by mouth daily as needed. For potassium level regulation, take potassium as needed when taking both lasix tablets.      [name] PROAIR HFA 108 (90 BASE) MCG/ACT inhaler INHALE 1 TO 2 PUFFS EVERY 6 HOURS AS NEEDED  1 each  0  .  Rivaroxaban (XARELTO) 20 MG TABS Take 20 mg by mouth daily.  90 tablet  3  . sodium chloride 0.9 % SOLN 250 mL with vancomycin 1000 MG SOLR 1,000 mg Inject 1,000 mg into the vein every 12 (twelve) hours.      . Tamsulosin HCl (FLOMAX) 0.4 MG CAPS Take 0.4 mg by mouth 2 (two) times daily.       . [DISCONTINUED] metoprolol tartrate (LOPRESSOR) 25 MG tablet Take 1/2 tablet every other day   45 tablet  3   No current facility-administered medications on file prior to visit.    Allergies  Allergen Reactions  . Codeine Nausea And Vomiting  . Inspra (Eplerenone) Other (See Comments)    Stomach problems  . Spironolactone Other (See Comments)    Gynecomastia  . Doxycycline Itching and Rash  . Sulfonamide Derivatives Itching and Rash    Past Medical History  Diagnosis Date  . Chronic systolic heart failure     a. Chronic LV  systolic failure. b. Echo in Dec 2011 showed EF of 25% - f/u echo 02/2011: Mild LVH, EF 15%, posterior lateral akinesis, inferior akinesis, grade 1 diastolic dysfunction, mild LAE. c. s/p BiV pacemaker 06/2011.  [name] Atrial flutter     a. Off amiodarone because of increased LFTs  . COPD (chronic obstructive pulmonary disease)     Chronic SOB, O2 dependent  . Myocardial infarction 1994  . Gastro - esophageal reflux   . Claudication   . BPH (benign prostatic hyperplasia)   . Coronary artery disease     a. Myocardial infarction in 1984,  99 and 07. b. H/o multiple RCA stents. c.  LHC 04/2011: without obstructive disease. oLM 25%, mLAD 30%, pCFX 25%, prox to mid RCA stent ok, dRCA 30% and 70%, pPDA 25%, mPL 25-30%, EF 25%.  [name] Hypertension   . Atrial fibrillation     a. On Xarelto.  . Hemorrhoids   . Hyperlipidemia     statin intolerant 2/2 myalgias  . Hx of adenomatous colonic polyps   . Hx of colonoscopy   . Chronic renal insufficiency   . Biventricular cardiac pacemaker in situ 3.2013  . NSVT (nonsustained ventricular tachycardia)     a. 04/2012- 6 beats on tele.  [name] Shortness of breath   . Arthritis     hips ,back ,hands    Past Surgical History  Procedure Laterality Date  . Cardiac catheterization  2008  . Cataract extraction    . Salivary gland surgery    . Coronary angioplasty with stent placement    . Insert / replace / remove pacemaker  06/16/2011  . Tee without cardioversion  05/08/2012    Procedure: TRANSESOPHAGEAL ECHOCARDIOGRAM (TEE);  Surgeon: Andrew Mixer, MD;  Location: [facility] ENDOSCOPY;  Service: Cardiovascular;  Laterality: N/A;    History  Smoking status  . Former Smoker -- 1.50 packs/day for 55 years  . Quit date: 01/03/2009  Smokeless tobacco  . Never Used    History  Alcohol Use  . Yes    Comment: occasional    Family History  Problem Relation Age of Onset  . Coronary artery disease Father   . Heart disease Mother   . Colon cancer Neg Hx   . Heart  disease Brother     Review of Systems: The review of systems is per the HPI.  All other systems were reviewed and are negative.  Physical Exam: BP 142/60  Pulse 70  Resp 24  Ht 6' (1.829 m)  Wt 193 lb 12.8 oz (87.907 kg)  BMI 26.28 kg/m2 Weight is unchanged.  Patient is very pleasant and in no acute distress. He does look chronically ill. Skin is warm and dry. Color is normal.  HEENT is unremarkable. Normocephalic/atraumatic. PERRL. Sclera are nonicteric. Neck is supple. No masses. No JVD. Lungs are fairly clear with some faint wheezing. Cardiac exam shows a regular rate and rhythm. His pacer pocket looks good. Abdomen is soft. Extremities are without edema. Gait and ROM are intact. No gross neurologic deficits noted. Has a PICC line in place.   LABORATORY DATA: Pending  EKG today shows a paced rhythm.   Lab Results  Component Value Date   WBC 9.2 05/10/2012   HGB 9.4* 05/10/2012   HCT 29.2* 05/10/2012   PLT 289 05/10/2012   GLUCOSE 89 05/10/2012   CHOL 136 03/17/2012   TRIG 113.0 03/17/2012   HDL 30.70* 03/17/2012   LDLDIRECT 155.7 10/14/2010   LDLCALC 83 03/17/2012   ALT 9 05/04/2012   AST 18 05/04/2012   NA 137 05/10/2012   K 3.5 05/10/2012   CL 100 05/10/2012   CREATININE 1.20 05/10/2012   BUN 14 05/10/2012   CO2 30 05/10/2012   TSH 4.286 08/09/2011   INR 1.77* 05/03/2012   HGBA1C 6.0* 03/10/2011   TEE Study Conclusions  - Left ventricle: Systolic function was moderately to severely reduced. The estimated ejection fraction was in the range of 30% to 35%. - Aortic valve: No evidence of vegetation. - Mitral valve: No evidence of vegetation. - Left atrium: No evidence of thrombus in the atrial cavity or appendage. - Tricuspid valve: No evidence of vegetation. - Pulmonic valve: No evidence of vegetation. - Line: A venous pacing wire was visualized in the right atrial cavity and right ventricular cavity, with its tip at the RV apex.   Assessment / Plan: 1. Recent sepsis with  enterococcal bacteremia - according to ID if he has recurrence, his device will need to be explanted. He remains on antibiotics for a total of 6 weeks.   2. Systolic heart failure - EF has actually improved some with the recent TEE. Clinically however, he is not doing well. I offered to admit him. He does not want to proceed along that course at this time. I am going to diurese him over the next 3 days. If he fails to improve, he will need to be admitted for possible Milrinone (he has had before). No other changes in his medicines. We are rechecking labs today.   3. HTN - he is monitoring at home. For now, no change in his medicines.   Patient is agreeable to this plan and will call if any problems develop in the interim.

## 2012-05-29 NOTE — Telephone Encounter (Signed)
I called the pt again tonight. He was contacted by [name] from [facility] cardiology. They have uppped his potassium repletion though he is also being diuresed at the same time. He continues to have significant DOE undoubtedly due to his CHF. The high volume of IVF with ampicillin with ceftriaxone to treat his enterococcal bacteremia

## 2012-05-29 NOTE — Telephone Encounter (Signed)
He did have a BMP but it is not resulted yet

## 2012-05-29 NOTE — Telephone Encounter (Signed)
He did have a BMP but it is not resulted as of yet but should be by the end of the day. He is taking Kdur as prescribed by his PCP. [facility] also called back to ask for a verbal on extending his nurse visits until the end of his IV medications. I gave them a verbal to continue at least 3 more weeks which is how long he is scheduled to continue his therapy or until we give a D/C order.

## 2012-05-29 NOTE — Telephone Encounter (Signed)
See separate phone note.

## 2012-05-30 ENCOUNTER — Telehealth: Payer: Self-pay | Admitting: Radiology

## 2012-05-30 NOTE — Telephone Encounter (Signed)
Potassium was increased yesterday.  Pt to take 40 meq last night, 60 meq on Tues and Wed.  He is to repeat is bmet on Thursday.  He was made aware of these directions yesterday and agreed.

## 2012-05-30 NOTE — Telephone Encounter (Signed)
You sent me a note, who is following Anemia, patient has been seen at ID clinic and also by cardiology since last visit.

## 2012-06-01 ENCOUNTER — Ambulatory Visit (INDEPENDENT_AMBULATORY_CARE_PROVIDER_SITE_OTHER): Payer: Medicare Other | Admitting: *Deleted

## 2012-06-01 DIAGNOSIS — I4892 Unspecified atrial flutter: Secondary | ICD-10-CM

## 2012-06-01 DIAGNOSIS — I10 Essential (primary) hypertension: Secondary | ICD-10-CM

## 2012-06-01 DIAGNOSIS — I5022 Chronic systolic (congestive) heart failure: Secondary | ICD-10-CM

## 2012-06-02 ENCOUNTER — Telehealth: Payer: Self-pay | Admitting: *Deleted

## 2012-06-02 ENCOUNTER — Other Ambulatory Visit: Payer: Self-pay | Admitting: *Deleted

## 2012-06-02 DIAGNOSIS — E876 Hypokalemia: Secondary | ICD-10-CM

## 2012-06-02 LAB — BASIC METABOLIC PANEL
CO2: 31 mEq/L (ref 19–32)
Glucose, Bld: 83 mg/dL (ref 70–99)
Potassium: 3 mEq/L — ABNORMAL LOW (ref 3.5–5.1)
Sodium: 134 mEq/L — ABNORMAL LOW (ref 135–145)

## 2012-06-02 NOTE — Telephone Encounter (Signed)
To address a K+ level of 3.0 the following was received from Dr. Tenny Craw:  Andrew [name] is followed by Daiva Nakayama and L Gerhardt. Had BMET yesterday K is still low He should take 40 K now then 20 mEq po 1x per day Needs to have BMET and MG checked on Tuesday. Check BNP as well.  Pt notified and labs ordered for 06/06/2012.

## 2012-06-06 ENCOUNTER — Telehealth: Payer: Self-pay | Admitting: Internal Medicine

## 2012-06-06 NOTE — Telephone Encounter (Signed)
Spoke with pt. Pt unable to come for lab today because of transportation.  Will come in the morning.

## 2012-06-06 NOTE — Telephone Encounter (Signed)
New Problem:    Patient called in wanting to speak about a his most recent lab results form 06/01/12.  Please call back.

## 2012-06-07 ENCOUNTER — Other Ambulatory Visit (INDEPENDENT_AMBULATORY_CARE_PROVIDER_SITE_OTHER): Payer: Medicare Other

## 2012-06-07 DIAGNOSIS — R0602 Shortness of breath: Secondary | ICD-10-CM

## 2012-06-07 DIAGNOSIS — E876 Hypokalemia: Secondary | ICD-10-CM

## 2012-06-07 LAB — BASIC METABOLIC PANEL
CO2: 34 mEq/L — ABNORMAL HIGH (ref 19–32)
Chloride: 94 mEq/L — ABNORMAL LOW (ref 96–112)
Glucose, Bld: 103 mg/dL — ABNORMAL HIGH (ref 70–99)
Potassium: 2.7 mEq/L — CL (ref 3.5–5.1)
Sodium: 135 mEq/L (ref 135–145)

## 2012-06-13 ENCOUNTER — Telehealth: Payer: Self-pay

## 2012-06-13 NOTE — Telephone Encounter (Signed)
He should continue thru the 14th and then dc abx and dc picc

## 2012-06-13 NOTE — Telephone Encounter (Signed)
Patient's wife states they called [facility] for more antibiotics and were told the medication was discontinued.  They have two  days left and want to make sure this is correct before stopping . I do not see a d/c order.  Please advise.

## 2012-06-13 NOTE — Telephone Encounter (Signed)
[facility] informed to continue  IV Atbx through June 16, 2012 and PICC can be removed once completed . Patients wife informed I would call [facility] with this order to make sure they are the same and if not I will give them the stop dates.    Tammy King,RN

## 2012-06-14 ENCOUNTER — Encounter: Payer: Self-pay | Admitting: Infectious Disease

## 2012-06-14 ENCOUNTER — Other Ambulatory Visit: Payer: Medicare Other

## 2012-06-14 ENCOUNTER — Other Ambulatory Visit: Payer: Self-pay | Admitting: *Deleted

## 2012-06-14 DIAGNOSIS — I10 Essential (primary) hypertension: Secondary | ICD-10-CM

## 2012-06-14 NOTE — Telephone Encounter (Signed)
Thanks

## 2012-06-16 ENCOUNTER — Other Ambulatory Visit (INDEPENDENT_AMBULATORY_CARE_PROVIDER_SITE_OTHER): Payer: Medicare Other

## 2012-06-16 DIAGNOSIS — I10 Essential (primary) hypertension: Secondary | ICD-10-CM

## 2012-06-16 LAB — BASIC METABOLIC PANEL
BUN: 12 mg/dL (ref 6–23)
CO2: 27 mEq/L (ref 19–32)
Chloride: 96 mEq/L (ref 96–112)
Creatinine, Ser: 1.3 mg/dL (ref 0.4–1.5)

## 2012-06-19 ENCOUNTER — Other Ambulatory Visit: Payer: Self-pay | Admitting: *Deleted

## 2012-06-19 ENCOUNTER — Other Ambulatory Visit: Payer: Self-pay

## 2012-06-19 ENCOUNTER — Other Ambulatory Visit: Payer: Self-pay | Admitting: Cardiology

## 2012-06-19 DIAGNOSIS — E876 Hypokalemia: Secondary | ICD-10-CM

## 2012-06-19 MED ORDER — POTASSIUM CHLORIDE ER 10 MEQ PO TBCR: 10.0000 meq | EXTENDED_RELEASE_TABLET | Freq: Every day | ORAL | Status: DC | PRN

## 2012-06-19 MED ORDER — FLUTICASONE PROPIONATE 50 MCG/ACT NA SUSP: 2.0000 | Freq: Every day | NASAL | Status: DC

## 2012-06-19 MED ORDER — POTASSIUM CHLORIDE ER 10 MEQ PO TBCR: EXTENDED_RELEASE_TABLET | ORAL | Status: DC

## 2012-06-20 ENCOUNTER — Other Ambulatory Visit: Payer: Self-pay | Admitting: *Deleted

## 2012-06-20 MED ORDER — CARVEDILOL 25 MG PO TABS: 25.0000 mg | ORAL_TABLET | Freq: Two times a day (BID) | ORAL | Status: DC

## 2012-06-22 ENCOUNTER — Other Ambulatory Visit (INDEPENDENT_AMBULATORY_CARE_PROVIDER_SITE_OTHER): Payer: Medicare Other

## 2012-06-22 DIAGNOSIS — E876 Hypokalemia: Secondary | ICD-10-CM

## 2012-06-22 LAB — BASIC METABOLIC PANEL
Calcium: 9.2 mg/dL (ref 8.4–10.5)
Creatinine, Ser: 1.3 mg/dL (ref 0.4–1.5)
Glucose, Bld: 108 mg/dL — ABNORMAL HIGH (ref 70–99)
Potassium: 4.8 mEq/L (ref 3.5–5.1)

## 2012-06-27 ENCOUNTER — Ambulatory Visit: Payer: Medicare Other | Admitting: Infectious Disease

## 2012-06-27 ENCOUNTER — Encounter: Payer: Self-pay | Admitting: Cardiology

## 2012-06-27 ENCOUNTER — Ambulatory Visit (INDEPENDENT_AMBULATORY_CARE_PROVIDER_SITE_OTHER): Payer: Medicare Other | Admitting: Cardiology

## 2012-06-27 ENCOUNTER — Encounter: Payer: Self-pay | Admitting: Internal Medicine

## 2012-06-27 VITALS — BP 150/75 | HR 65 | Ht 72.0 in | Wt 181.0 lb

## 2012-06-27 DIAGNOSIS — Z95810 Presence of automatic (implantable) cardiac defibrillator: Secondary | ICD-10-CM

## 2012-06-27 DIAGNOSIS — I255 Ischemic cardiomyopathy: Secondary | ICD-10-CM

## 2012-06-27 DIAGNOSIS — I472 Ventricular tachycardia: Secondary | ICD-10-CM

## 2012-06-27 DIAGNOSIS — I5022 Chronic systolic (congestive) heart failure: Secondary | ICD-10-CM

## 2012-06-27 DIAGNOSIS — I4891 Unspecified atrial fibrillation: Secondary | ICD-10-CM

## 2012-06-27 DIAGNOSIS — I2589 Other forms of chronic ischemic heart disease: Secondary | ICD-10-CM

## 2012-06-27 LAB — PACEMAKER DEVICE OBSERVATION
AL AMPLITUDE: 2.1 mv
AL IMPEDENCE PM: 494 Ohm
AL THRESHOLD: 1 V
ATRIAL PACING PM: 2.1
RV LEAD AMPLITUDE: 7.9 mv
RV LEAD IMPEDENCE PM: 456 Ohm

## 2012-06-27 NOTE — Patient Instructions (Signed)
Remote monitoring is used to monitor your Pacemaker of ICD from home. This monitoring reduces the number of office visits required to check your device to one time per year. It allows [place] to keep an eye on the functioning of your device to ensure it is working properly. You are scheduled for a device check from home on 10/02/2012. You may send your transmission at any time that day. If you have a wireless device, the transmission will be sent automatically. After your physician reviews your transmission, you will receive a postcard with your next transmission date.  Your physician recommends that you schedule a follow-up appointment in: 6 months with Dr. Graciela Husbands

## 2012-06-27 NOTE — Progress Notes (Signed)
ELECTROPHYSIOLOGY OFFICE NOTE  Patient ID: Andrew [name] MRN: [id number], DOB/AGE: 1931/02/21   Date of Visit: 06/27/2012  Primary Physician: Lucilla Edin, MD Primary Cardiologist: Antoine Poche, MD Primary EP: Graciela Husbands, MD Reason for Visit: EP/device follow-up  History of Present Illness  Andrew [name] is a pleasant 79 year old man with an ischemic CM, chronic systolic HF and LBBB, now s/p BiV PPM implant for CRT, CAD, COPD, PAF and NSVT who presents today for routine electrophysiology followup. He is now one year post implant. He states he is "doing fine" from a cardiac standpoint; however, he was hospitalized in Jan 2014 with PNA and enterococcal bacteremia. He finished a 6-week course of ampicillin about 2 weeks ago. He has follow-up scheduled with Dr. Algis Liming this week for repeat blood cultures. He denies fever or chills. He denies CP or worsening SOB. He tells me he has chronic DOE which is felt to be multifactorial, from COPD and HF but this is no worse than his baseline and has improved some with CRT. He denies palpitations, dizziness, near syncope or syncope. He denies LE swelling, orthopnea, PND or recent weight gain. As instructed by Dr. Antoine Poche, he adjusts his diuretic therapy based on daily weights. He states this has been stable. Andrew [name] reports compliance and feels he is tolerating medications without difficulty.  Past Medical History Past Medical History  Diagnosis Date  . Chronic systolic heart failure     a. Chronic LV systolic failure. b. Echo in Dec 2011 showed EF of 25% - f/u echo 02/2011: Mild LVH, EF 15%, posterior lateral akinesis, inferior akinesis, grade 1 diastolic dysfunction, mild LAE. c. s/p BiV pacemaker 06/2011.  [name] Atrial flutter     a. Off amiodarone because of increased LFTs  . COPD (chronic obstructive pulmonary disease)     Chronic SOB, O2 dependent  . Myocardial infarction 1994  . Gastro - esophageal reflux   . Claudication   . BPH (benign prostatic  hyperplasia)   . Coronary artery disease     a. Myocardial infarction in 1984,  99 and 07. b. H/o multiple RCA stents. c.  LHC 04/2011: without obstructive disease. oLM 25%, mLAD 30%, pCFX 25%, prox to mid RCA stent ok, dRCA 30% and 70%, pPDA 25%, mPL 25-30%, EF 25%.  [name] Hypertension   . Atrial fibrillation     a. On Xarelto.  . Hemorrhoids   . Hyperlipidemia     statin intolerant 2/2 myalgias  . Hx of adenomatous colonic polyps   . Hx of colonoscopy   . Chronic renal insufficiency   . Biventricular cardiac pacemaker in situ 3.2013  . NSVT (nonsustained ventricular tachycardia)     a. 04/2012- 6 beats on tele.  [name] Shortness of breath   . Arthritis     hips ,back ,hands    Past Surgical History Past Surgical History  Procedure Laterality Date  . Cardiac catheterization  2008  . Cataract extraction    . Salivary gland surgery    . Coronary angioplasty with stent placement    . Insert / replace / remove pacemaker  06/16/2011  . Tee without cardioversion  05/08/2012    Procedure: TRANSESOPHAGEAL ECHOCARDIOGRAM (TEE);  Surgeon: Vesta Mixer, MD;  Location: [facility] ENDOSCOPY;  Service: Cardiovascular;  Laterality: N/A;     Allergies/Intolerances Allergies  Allergen Reactions  . Codeine Nausea And Vomiting  . Inspra (Eplerenone) Other (See Comments)    Stomach problems  . Spironolactone Other (See Comments)  Gynecomastia  . Doxycycline Itching and Rash  . Sulfonamide Derivatives Itching and Rash    Current Home Medications Current Outpatient Prescriptions  Medication Sig Dispense Refill  . albuterol (2.5 MG/3ML) 0.083% NEBU 3 mL, albuterol (5 MG/ML) 0.5% NEBU 0.5 mL Inhale 2.5 mg into the lungs 3 (three) times daily as needed. For shortness of breath.      . Albuterol Sulfate (PROAIR HFA IN) Inhale 2 puffs into the lungs every 8 (eight) hours as needed. Shortness of breath.      . ALPRAZolam (XANAX) 0.25 MG tablet Take 0.25 mg by mouth daily as needed. For anxiety.      .  carvedilol (COREG) 25 MG tablet Take 1 tablet (25 mg total) by mouth 2 (two) times daily.  120 tablet  1  . dextrose 5 % SOLN 50 mL with cefTRIAXone 2 G SOLR 2 g Inject 2 g into the vein every 12 (twelve) hours.  2 g  10  . esomeprazole (NEXIUM) 40 MG capsule Take 40 mg by mouth 2 (two) times daily.      . fluticasone (FLONASE) 50 MCG/ACT nasal spray Place 2 sprays into the nose daily.  16 g  2  . Fluticasone-Salmeterol (ADVAIR DISKUS) 250-50 MCG/DOSE AEPB Inhale 1 puff into the lungs 2 (two) times daily.  3 each  1  . furosemide (LASIX) 40 MG tablet Take 40-80 mg by mouth daily. Takes 1 tablet everyday; then based on weight gain from previous day, if >2 lbs. Take 1 additional tablet as needed.      . hydrALAZINE (APRESOLINE) 25 MG tablet Take 25 mg by mouth 3 (three) times daily.      [name] HYDROcodone-acetaminophen (VICODIN) 5-500 MG per tablet Take 1 tablet by mouth every 6 (six) hours as needed. For pain.      [name] losartan (COZAAR) 50 MG tablet Take 50 mg by mouth 2 (two) times daily.      . nitroGLYCERIN (NITROSTAT) 0.4 MG SL tablet Place 1 tablet (0.4 mg total) under the tongue every 5 (five) minutes as needed for chest pain.  25 tablet  6  . potassium chloride (K-DUR) 10 MEQ tablet For potassium level regulation, take potassium as needed when taking both lasix tablets.  30 tablet  12  . PROAIR HFA 108 (90 BASE) MCG/ACT inhaler INHALE 1 TO 2 PUFFS EVERY 6 HOURS AS NEEDED  1 each  0  . Rivaroxaban (XARELTO) 20 MG TABS Take 20 mg by mouth daily.  90 tablet  3  . sodium chloride 0.9 % SOLN 250 mL with vancomycin 1000 MG SOLR 1,000 mg Inject 1,000 mg into the vein every 12 (twelve) hours.      . Tamsulosin HCl (FLOMAX) 0.4 MG CAPS Take 0.4 mg by mouth 2 (two) times daily.       . [DISCONTINUED] metoprolol tartrate (LOPRESSOR) 25 MG tablet Take 1/2 tablet every other day   45 tablet  3   No current facility-administered medications for this visit.    Social History History   Social History  .  Marital Status: Married   Occupational History  . retired Designer, industrial/product work    Social History Main Topics  . Smoking status: Former Smoker -- 1.50 packs/day for 55 years    Quit date: 01/03/2009  . Smokeless tobacco: Never Used  . Alcohol Use: Yes     Comment: occasional  . Drug Use: No   Review of Systems General: No chills, fever, night sweats or weight changes Cardiovascular:  No chest pain, dyspnea on exertion, edema, orthopnea, palpitations, paroxysmal nocturnal dyspnea Dermatological: No rash, lesions or masses Respiratory: No cough, dyspnea Urologic: No hematuria, dysuria Abdominal: No nausea, vomiting, diarrhea, bright red blood per rectum, melena, or hematemesis Neurologic: No visual changes, weakness, changes in mental status All other systems reviewed and are otherwise negative except as noted above.  Physical Exam Blood pressure 150/75, pulse 65, height 6' (1.829 m), weight 181 lb (82.101 kg), SpO2 97.00%.  General: Well developed, well appearing 79 year old male in no acute distress. HEENT: Normocephalic, atraumatic. EOMs intact. Sclera nonicteric. Oropharynx clear.  Neck: Supple. No JVD. Lungs: Respirations regular and unlabored, CTA bilaterally. No wheezes, rales or rhonchi. Heart: RRR. S1, S2 present. No murmurs, rub, S3 or S4. Abdomen: Soft, non-tender, non-distended. BS present x 4 quadrants. No hepatosplenomegaly.  Extremities: No clubbing, cyanosis or edema. DP/PT/Radials 2+ and equal bilaterally. Psych: Normal affect. Neuro: Alert and oriented X 3. Moves all extremities spontaneously.   Diagnostics Device interrogation today shows normal BiV PPM function with good battery status and stable lead measurements/parameters; BiV pacing 98.8% of the time; 9 NSVT episodes, longest 4 seconds; 0 AT/AF episodes; OptiVol reviewed and appears to be trending down; device reprogrammed to appropriate safety margins otherwise no programming changes made; see PaceArt  report  Assessment and Plan 1. Ischemic CM with chronic systolic HF, BiV PPM in place for CRT Normal BiV PPM function See PaceArt report Continue medical therapy with carvedilol and ARB in addition to diuretics as directed by Dr. Antoine Poche Will set up for remote monitoring. Reviewed instructions and follow-up. He will send [employer] remote transmission on 10/02/2012. Will return to clinic for follow-up with Dr. Graciela Husbands in 6 months. 2. NSVT Asymptomatic, brief episodes; longest 4 seconds on device interrogation today Continue BB 3. PAF In SR currently Continue rate control Continue Xarelto for embolic prophylaxis 4. Recent enterococcal bacteremia Has follow-up scheduled this week with ID Will await their recommendations based on repeat blood cultures  Signed, Audrea Bolte, PA-C 06/27/2012, 10:30 AM

## 2012-06-29 ENCOUNTER — Other Ambulatory Visit: Payer: Self-pay | Admitting: Physician Assistant

## 2012-06-29 ENCOUNTER — Encounter: Payer: Self-pay | Admitting: Infectious Disease

## 2012-06-29 ENCOUNTER — Ambulatory Visit (INDEPENDENT_AMBULATORY_CARE_PROVIDER_SITE_OTHER): Payer: Medicare Other | Admitting: Infectious Disease

## 2012-06-29 ENCOUNTER — Other Ambulatory Visit: Payer: Self-pay | Admitting: Cardiology

## 2012-06-29 VITALS — BP 112/64 | HR 69 | Temp 97.6°F | Wt 181.0 lb

## 2012-06-29 DIAGNOSIS — Z5189 Encounter for other specified aftercare: Secondary | ICD-10-CM

## 2012-06-29 DIAGNOSIS — T82111D Breakdown (mechanical) of cardiac pulse generator (battery), subsequent encounter: Secondary | ICD-10-CM

## 2012-06-29 DIAGNOSIS — B952 Enterococcus as the cause of diseases classified elsewhere: Secondary | ICD-10-CM

## 2012-06-29 DIAGNOSIS — R7881 Bacteremia: Secondary | ICD-10-CM

## 2012-06-29 DIAGNOSIS — I255 Ischemic cardiomyopathy: Secondary | ICD-10-CM

## 2012-06-29 DIAGNOSIS — I2589 Other forms of chronic ischemic heart disease: Secondary | ICD-10-CM

## 2012-06-29 NOTE — Addendum Note (Signed)
Addended by: Starleen Arms D on: 06/29/2012 03:12 PM   Modules accepted: Orders

## 2012-06-29 NOTE — Progress Notes (Signed)
Subjective:    Patient ID: Andrew [name], male    DOB: 22-Nov-1930, 79 y.o.   MRN: [id number]  HPI   79 year old man with BiV PM and Amp S enterococcal bacteremia who was treated as an inpatient with high-dose ampicillin with ceftriaxone. Transesophageal echocardiogram failed to show evidence of endocarditis or vegetations on the pacemaker wires themselves.Pacemaker was not removed blood cultures did clear on the first of February. Patient was discharged to home on high-dose ampicillin and vancomycin.  Apparently there was an issue with the ampicillin which needs to be mixed fresh every 3 days. What I understand there had been plans for the wife to do some of the mixing but the wife herself has a severe penicillin allergy with hives. Because of this concern the patient had been changed to high-dose vancomycin and continued high-dose ceftriaxone by my partner Dr. Orvan Falconer while he sought a more workable solution to abx. Had extensive discussions with [facility] as well as with Dr. Orvan Falconer  At last visit we changed him to high dose ampicillin and ceftriaxone  He completed 6 weeks of therapy 2 weeks ago and returns for followup visit. He is without fevers chills malaise or nausea. PM was interogated yesterady by [facility].  We will perform repeat blood cultures to ensure clearance of his bacteremia and to make sure that there is no evidence of recurrence and pacemaker infection.   Review of Systems  Constitutional: Negative for fever, chills, diaphoresis, activity change, appetite change, fatigue and unexpected weight change.  HENT: Negative for congestion, sore throat, rhinorrhea, sneezing, trouble swallowing and sinus pressure.   Eyes: Negative for photophobia and visual disturbance.  Respiratory: Negative for cough, chest tightness, shortness of breath, wheezing and stridor.   Cardiovascular: Negative for chest pain, palpitations and leg swelling.  Gastrointestinal:  Negative for nausea, vomiting, abdominal pain, diarrhea, constipation, blood in stool, abdominal distention and anal bleeding.  Genitourinary: Negative for dysuria, hematuria, flank pain and difficulty urinating.  Musculoskeletal: Negative for myalgias, back pain, joint swelling, arthralgias and gait problem.  Skin: Negative for color change, pallor, rash and wound.  Neurological: Negative for dizziness, tremors, weakness and light-headedness.  Hematological: Negative for adenopathy. Does not bruise/bleed easily.  Psychiatric/Behavioral: Negative for behavioral problems, confusion, sleep disturbance, dysphoric mood, decreased concentration and agitation.       Objective:   Physical Exam  Constitutional: He is oriented to person, place, and time. He appears well-nourished. No distress.  HENT:  Head: Normocephalic and atraumatic.  Mouth/Throat: Oropharynx is clear and moist. No oropharyngeal exudate.  Eyes: Conjunctivae and EOM are normal. No scleral icterus.  Neck: Normal range of motion. Neck supple.  Cardiovascular: Normal rate, regular rhythm and normal heart sounds.  Exam reveals no gallop and no friction rub.   No murmur heard. Pulmonary/Chest: Effort normal and breath sounds normal. No respiratory distress. He has no wheezes. He has no rales. He exhibits no tenderness.  Abdominal: He exhibits no distension. There is no tenderness. There is no rebound and no guarding.  Musculoskeletal: He exhibits no edema and no tenderness.  Lymphadenopathy:    He has no cervical adenopathy.  Neurological: He is alert and oriented to person, place, and time. He exhibits normal muscle tone. Coordination normal.  Skin: Skin is warm and dry. He is not diaphoretic. No erythema. No pallor.     Psychiatric: He has a normal mood and affect. His behavior is normal. Judgment and thought content normal.  Assessment & Plan:  Ampicillin sensitive enterococcal bacteremia with pacemaker present:    Source was not clear. He was thought to possibly a pneumonia by the admitting physician for the last admission when he was diagnosed with enterococcal bacteremia. Usual suspects such as urine or GI were not investigated the time. Again transesophageal echocardiogram failed to show any evidence of vegetations. Sp 6 weeks of antibiotics now 2 weeks since he was last given a dose of antibiotics we'll check surveillance blood cultures today and hopefully it'll be negative.  Certainly he has recurrence of his enterococcal bacteremia will need extraction of the pacemaker performed.    CHF: Again some of his symptoms of dyspnea on exertion make me concerned that he may have some degree of worsening of his heart failure and encouraged to followup with his cardiologist.

## 2012-06-29 NOTE — Addendum Note (Signed)
Addended by: Mariea Clonts D on: 06/29/2012 03:56 PM   Modules accepted: Orders

## 2012-07-03 ENCOUNTER — Emergency Department (HOSPITAL_COMMUNITY)
Admission: EM | Admit: 2012-07-03 | Discharge: 2012-07-04 | Disposition: A | Discharge status: Home / Self Care | Payer: Medicare Other | Attending: Emergency Medicine | Admitting: Emergency Medicine

## 2012-07-03 ENCOUNTER — Encounter (HOSPITAL_COMMUNITY): Payer: Self-pay

## 2012-07-03 DIAGNOSIS — I4892 Unspecified atrial flutter: Secondary | ICD-10-CM | POA: Insufficient documentation

## 2012-07-03 DIAGNOSIS — S81811A Laceration without foreign body, right lower leg, initial encounter: Secondary | ICD-10-CM

## 2012-07-03 DIAGNOSIS — Z8709 Personal history of other diseases of the respiratory system: Secondary | ICD-10-CM | POA: Insufficient documentation

## 2012-07-03 DIAGNOSIS — S81009A Unspecified open wound, unspecified knee, initial encounter: Secondary | ICD-10-CM | POA: Insufficient documentation

## 2012-07-03 DIAGNOSIS — Z860100 Personal history of colon polyps, unspecified: Secondary | ICD-10-CM | POA: Insufficient documentation

## 2012-07-03 DIAGNOSIS — I252 Old myocardial infarction: Secondary | ICD-10-CM | POA: Insufficient documentation

## 2012-07-03 DIAGNOSIS — Z7901 Long term (current) use of anticoagulants: Secondary | ICD-10-CM | POA: Insufficient documentation

## 2012-07-03 DIAGNOSIS — Y9389 Activity, other specified: Secondary | ICD-10-CM | POA: Insufficient documentation

## 2012-07-03 DIAGNOSIS — J4489 Other specified chronic obstructive pulmonary disease: Secondary | ICD-10-CM | POA: Insufficient documentation

## 2012-07-03 DIAGNOSIS — M129 Arthropathy, unspecified: Secondary | ICD-10-CM | POA: Insufficient documentation

## 2012-07-03 DIAGNOSIS — Z862 Personal history of diseases of the blood and blood-forming organs and certain disorders involving the immune mechanism: Secondary | ICD-10-CM | POA: Insufficient documentation

## 2012-07-03 DIAGNOSIS — Z8679 Personal history of other diseases of the circulatory system: Secondary | ICD-10-CM | POA: Insufficient documentation

## 2012-07-03 DIAGNOSIS — N400 Enlarged prostate without lower urinary tract symptoms: Secondary | ICD-10-CM | POA: Insufficient documentation

## 2012-07-03 DIAGNOSIS — J449 Chronic obstructive pulmonary disease, unspecified: Secondary | ICD-10-CM | POA: Insufficient documentation

## 2012-07-03 DIAGNOSIS — K219 Gastro-esophageal reflux disease without esophagitis: Secondary | ICD-10-CM | POA: Insufficient documentation

## 2012-07-03 DIAGNOSIS — I4891 Unspecified atrial fibrillation: Secondary | ICD-10-CM | POA: Insufficient documentation

## 2012-07-03 DIAGNOSIS — Z79899 Other long term (current) drug therapy: Secondary | ICD-10-CM | POA: Insufficient documentation

## 2012-07-03 DIAGNOSIS — Z9861 Coronary angioplasty status: Secondary | ICD-10-CM | POA: Insufficient documentation

## 2012-07-03 DIAGNOSIS — Z87448 Personal history of other diseases of urinary system: Secondary | ICD-10-CM | POA: Insufficient documentation

## 2012-07-03 DIAGNOSIS — Z87891 Personal history of nicotine dependence: Secondary | ICD-10-CM | POA: Insufficient documentation

## 2012-07-03 DIAGNOSIS — Z8639 Personal history of other endocrine, nutritional and metabolic disease: Secondary | ICD-10-CM | POA: Insufficient documentation

## 2012-07-03 DIAGNOSIS — Y9289 Other specified places as the place of occurrence of the external cause: Secondary | ICD-10-CM | POA: Insufficient documentation

## 2012-07-03 DIAGNOSIS — Z950 Presence of cardiac pacemaker: Secondary | ICD-10-CM | POA: Insufficient documentation

## 2012-07-03 DIAGNOSIS — IMO0002 Reserved for concepts with insufficient information to code with codable children: Secondary | ICD-10-CM | POA: Insufficient documentation

## 2012-07-03 DIAGNOSIS — I10 Essential (primary) hypertension: Secondary | ICD-10-CM | POA: Insufficient documentation

## 2012-07-03 DIAGNOSIS — I5022 Chronic systolic (congestive) heart failure: Secondary | ICD-10-CM | POA: Insufficient documentation

## 2012-07-03 DIAGNOSIS — S91009A Unspecified open wound, unspecified ankle, initial encounter: Secondary | ICD-10-CM | POA: Insufficient documentation

## 2012-07-03 DIAGNOSIS — I2510 Atherosclerotic heart disease of native coronary artery without angina pectoris: Secondary | ICD-10-CM | POA: Insufficient documentation

## 2012-07-03 NOTE — ED Notes (Signed)
Tetanus up to date < 5 years

## 2012-07-03 NOTE — ED Notes (Signed)
Patient presents with a 7 cm skin tear to anterior right lower leg after accidentally bumping it on tv tray. Bleeding controlled at triage. Patient has very thin skin and is also on Xarelto. Patient usually has tingling to toes and reports no changes in sensation to foot/toes than normal.

## 2012-07-04 NOTE — ED Notes (Signed)
Pt wound cleaned out by RN and tech. Laceration on right lower leg about 7-8 in. Bleeding controlled

## 2012-07-04 NOTE — ED Notes (Signed)
Pt st's he bumped into a TV stand and has skin tear to right lower leg.  Dressing applied in triage and not removed at this time.

## 2012-07-04 NOTE — ED Notes (Signed)
Pt wound wrapped up and cleaned. Demonstrated wound cleaning with wife. Pt and family states they understand.

## 2012-07-04 NOTE — ED Provider Notes (Signed)
Medical screening examination/treatment/procedure(s) were performed by non-physician practitioner and as supervising physician I was immediately available for consultation/collaboration.   Benny Lennert, MD 07/04/12 [phone]

## 2012-07-04 NOTE — ED Notes (Signed)
PA at bedside. Suturing leg

## 2012-07-04 NOTE — ED Provider Notes (Signed)
History     CSN: [id number]  Arrival date & time 07/03/12  2325   None     Chief Complaint  Patient presents with  . Leg Injury    (Consider location/radiation/quality/duration/timing/severity/associated sxs/prior treatment) HPI History provided by pt.   Pt reports that a family member bumped into a tv tray beside him this evening and the tray fell, causing a skin tear to right lower leg.  Has moderate pain currently and bleeding is poorly controlled.  No associated acute paresthesias and is able to bear weight.  Is on Xarelto.  Believes his last tetanus was w/in the past 5 years.   Past Medical History  Diagnosis Date  . Chronic systolic heart failure     a. Chronic LV systolic failure. b. Echo in Dec 2011 showed EF of 25% - f/u echo 02/2011: Mild LVH, EF 15%, posterior lateral akinesis, inferior akinesis, grade 1 diastolic dysfunction, mild LAE. c. s/p BiV pacemaker 06/2011.  [name] Atrial flutter     a. Off amiodarone because of increased LFTs  . COPD (chronic obstructive pulmonary disease)     Chronic SOB, O2 dependent  . Myocardial infarction 1994  . Gastro - esophageal reflux   . Claudication   . BPH (benign prostatic hyperplasia)   . Coronary artery disease     a. Myocardial infarction in 1984,  99 and 07. b. H/o multiple RCA stents. c.  LHC 04/2011: without obstructive disease. oLM 25%, mLAD 30%, pCFX 25%, prox to mid RCA stent ok, dRCA 30% and 70%, pPDA 25%, mPL 25-30%, EF 25%.  [name] Hypertension   . Atrial fibrillation     a. On Xarelto.  . Hemorrhoids   . Hyperlipidemia     statin intolerant 2/2 myalgias  . Hx of adenomatous colonic polyps   . Hx of colonoscopy   . Chronic renal insufficiency   . Biventricular cardiac pacemaker in situ 3.2013  . NSVT (nonsustained ventricular tachycardia)     a. 04/2012- 6 beats on tele.  [name] Shortness of breath   . Arthritis     hips ,back ,hands    Past Surgical History  Procedure Laterality Date  . Cardiac catheterization  2008  .  Cataract extraction    . Salivary gland surgery    . Coronary angioplasty with stent placement    . Insert / replace / remove pacemaker  06/16/2011  . Tee without cardioversion  05/08/2012    Procedure: TRANSESOPHAGEAL ECHOCARDIOGRAM (TEE);  Surgeon: Vesta Mixer, MD;  Location: [facility] ENDOSCOPY;  Service: Cardiovascular;  Laterality: N/A;    Family History  Problem Relation Age of Onset  . Coronary artery disease Father   . Heart disease Mother   . Colon cancer Neg Hx   . Heart disease Brother     History  Substance Use Topics  . Smoking status: Former Smoker -- 1.50 packs/day for 55 years    Quit date: 01/03/2009  . Smokeless tobacco: Never Used  . Alcohol Use: Yes     Comment: occasional      Review of Systems  All other systems reviewed and are negative.    Allergies  Codeine; Inspra; Spironolactone; Doxycycline; and Sulfonamide derivatives  Home Medications   Current Outpatient Rx  Name  Route  Sig  Dispense  Refill  . albuterol (2.5 MG/3ML) 0.083% NEBU 3 mL, albuterol (5 MG/ML) 0.5% NEBU 0.5 mL   Inhalation   Inhale 2.5 mg into the lungs 3 (three) times daily as needed. For shortness  of breath.         [name] albuterol (PROVENTIL HFA;VENTOLIN HFA) 108 (90 BASE) MCG/ACT inhaler   Inhalation   Inhale 2 puffs into the lungs every 6 (six) hours as needed for wheezing.         [name] ALPRAZolam (XANAX) 0.25 MG tablet   Oral   Take 0.25 mg by mouth daily as needed. For anxiety.         . carvedilol (COREG) 25 MG tablet   Oral   Take 1 tablet (25 mg total) by mouth 2 (two) times daily.   120 tablet   1   . esomeprazole (NEXIUM) 40 MG capsule   Oral   Take 40 mg by mouth 2 (two) times daily.         . fluticasone (FLONASE) 50 MCG/ACT nasal spray   Nasal   Place 2 sprays into the nose daily.   16 g   2   . Fluticasone-Salmeterol (ADVAIR DISKUS) 250-50 MCG/DOSE AEPB   Inhalation   Inhale 1 puff into the lungs 2 (two) times daily.   3 each   1   .  furosemide (LASIX) 40 MG tablet   Oral   Take 40-80 mg by mouth daily. Takes 1 tablet everyday; then based on weight gain from previous day, if >2 lbs. Take 1 additional tablet as needed.         . hydrALAZINE (APRESOLINE) 25 MG tablet   Oral   Take 25 mg by mouth 3 (three) times daily.         [name] HYDROcodone-acetaminophen (VICODIN) 5-500 MG per tablet   Oral   Take 1 tablet by mouth every 6 (six) hours as needed. For pain.         [name] losartan (COZAAR) 50 MG tablet   Oral   Take 50 mg by mouth 2 (two) times daily.         . nitroGLYCERIN (NITROSTAT) 0.4 MG SL tablet   Sublingual   Place 1 tablet (0.4 mg total) under the tongue every 5 (five) minutes as needed for chest pain.   25 tablet   6   . Rivaroxaban (XARELTO) 20 MG TABS   Oral   Take 20 mg by mouth daily.   90 tablet   3   . Tamsulosin HCl (FLOMAX) 0.4 MG CAPS   Oral   Take 0.4 mg by mouth 2 (two) times daily.            BP 154/66  Pulse 82  Temp(Src) 98.1 F (36.7 C) (Oral)  Resp 18  SpO2 94%  Physical Exam  Nursing note and vitals reviewed. Constitutional: He is oriented to person, place, and time. He appears well-developed and well-nourished. No distress.  HENT:  Head: Normocephalic and atraumatic.  Eyes:  Normal appearance  Neck: Normal range of motion.  Pulmonary/Chest: Effort normal.  Musculoskeletal: Normal range of motion.  ~7.5cm diagonal lac through subq tissue over anterior right lower leg.  Clean.  Oozing blood at most distal aspect.  Ttp.  2+ DP pulse and distal sensation intact.   Neurological: He is alert and oriented to person, place, and time.  Psychiatric: He has a normal mood and affect. His behavior is normal.    ED Course  Procedures (including critical care time) LACERATION REPAIR Performed by: Otilio Miu Authorized by: Ruby Cola E Consent: Verbal consent obtained. Risks and benefits: risks, benefits and alternatives were discussed Consent  given by: patient Patient identity confirmed:  provided demographic data Prepped and Draped in normal sterile fashion Wound explored  Laceration Location: right lower leg2  Laceration Length: 7.5cm  No Foreign Bodies seen or palpated  Anesthesia: local infiltration  Local anesthetic: lidocaine 2% w/ epinephrine  Anesthetic total: 8 ml   Amount of cleaning: standard (nursing staff)  Skin closure: prolene 4.0  Number of sutures: 8  Technique: simple interrupted  Patient tolerance: Patient tolerated the procedure well with no immediate complications.  Labs Reviewed - No data to display No results found.   1. Laceration of right lower leg, initial encounter       MDM  Pt presents w/ laceration of right lower leg.  NV intact.  Tetanus up to date. Wound cleaned by nursing staff and sutured by myself.  Return precautions discussed. 2:38 AM         Otilio Miu, PA-C 07/04/12 [phone]

## 2012-07-05 ENCOUNTER — Encounter: Payer: Self-pay | Admitting: Infectious Disease

## 2012-07-06 ENCOUNTER — Other Ambulatory Visit: Payer: Self-pay | Admitting: Cardiology

## 2012-07-11 ENCOUNTER — Other Ambulatory Visit: Payer: Self-pay | Admitting: Cardiology

## 2012-07-14 ENCOUNTER — Emergency Department (INDEPENDENT_AMBULATORY_CARE_PROVIDER_SITE_OTHER)
Admission: EM | Admit: 2012-07-14 | Discharge: 2012-07-14 | Disposition: A | Discharge status: Home / Self Care | Payer: Medicare Other | Source: Home / Self Care | Attending: Family Medicine | Admitting: Family Medicine

## 2012-07-14 ENCOUNTER — Encounter (HOSPITAL_COMMUNITY): Payer: Self-pay | Admitting: *Deleted

## 2012-07-14 DIAGNOSIS — Z4802 Encounter for removal of sutures: Secondary | ICD-10-CM

## 2012-07-14 NOTE — ED Notes (Signed)
Pt   Here  For  Removal       Of  Sutures     That  Have been in  Place  For  10  Days

## 2012-07-14 NOTE — ED Provider Notes (Signed)
History     CSN: [id number]  Arrival date & time 07/14/12  1017   First MD Initiated Contact with Patient 07/14/12 1021      Chief Complaint  Patient presents with  . Suture / Staple Removal    (Consider location/radiation/quality/duration/timing/severity/associated sxs/prior treatment) Patient is a 79 y.o. male presenting with suture removal. The history is provided by the patient and a caregiver.  Suture / Staple Removal  The sutures were placed 7 to 10 days ago. Treatments since wound repair include antibiotic ointment use. There has been no drainage from the wound. There is no redness present.    Past Medical History  Diagnosis Date  . Chronic systolic heart failure     a. Chronic LV systolic failure. b. Echo in Dec 2011 showed EF of 25% - f/u echo 02/2011: Mild LVH, EF 15%, posterior lateral akinesis, inferior akinesis, grade 1 diastolic dysfunction, mild LAE. c. s/p BiV pacemaker 06/2011.  [name] Atrial flutter     a. Off amiodarone because of increased LFTs  . COPD (chronic obstructive pulmonary disease)     Chronic SOB, O2 dependent  . Myocardial infarction 1994  . Gastro - esophageal reflux   . Claudication   . BPH (benign prostatic hyperplasia)   . Coronary artery disease     a. Myocardial infarction in 1984,  99 and 07. b. H/o multiple RCA stents. c.  LHC 04/2011: without obstructive disease. oLM 25%, mLAD 30%, pCFX 25%, prox to mid RCA stent ok, dRCA 30% and 70%, pPDA 25%, mPL 25-30%, EF 25%.  [name] Hypertension   . Atrial fibrillation     a. On Xarelto.  . Hemorrhoids   . Hyperlipidemia     statin intolerant 2/2 myalgias  . Hx of adenomatous colonic polyps   . Hx of colonoscopy   . Chronic renal insufficiency   . Biventricular cardiac pacemaker in situ 3.2013  . NSVT (nonsustained ventricular tachycardia)     a. 04/2012- 6 beats on tele.  [name] Shortness of breath   . Arthritis     hips ,back ,hands    Past Surgical History  Procedure Laterality Date  . Cardiac  catheterization  2008  . Cataract extraction    . Salivary gland surgery    . Coronary angioplasty with stent placement    . Insert / replace / remove pacemaker  06/16/2011  . Tee without cardioversion  05/08/2012    Procedure: TRANSESOPHAGEAL ECHOCARDIOGRAM (TEE);  Surgeon: Vesta Mixer, MD;  Location: [facility] ENDOSCOPY;  Service: Cardiovascular;  Laterality: N/A;    Family History  Problem Relation Age of Onset  . Coronary artery disease Father   . Heart disease Mother   . Colon cancer Neg Hx   . Heart disease Brother     History  Substance Use Topics  . Smoking status: Former Smoker -- 1.50 packs/day for 55 years    Quit date: 01/03/2009  . Smokeless tobacco: Never Used  . Alcohol Use: Yes     Comment: occasional      Review of Systems  Constitutional: Negative.   Skin: Negative.     Allergies  Codeine; Inspra; Spironolactone; Doxycycline; and Sulfonamide derivatives  Home Medications   Current Outpatient Rx  Name  Route  Sig  Dispense  Refill  . albuterol (2.5 MG/3ML) 0.083% NEBU 3 mL, albuterol (5 MG/ML) 0.5% NEBU 0.5 mL   Inhalation   Inhale 2.5 mg into the lungs 3 (three) times daily as needed. For shortness of breath.         [name]  albuterol (PROVENTIL HFA;VENTOLIN HFA) 108 (90 BASE) MCG/ACT inhaler   Inhalation   Inhale 2 puffs into the lungs every 6 (six) hours as needed for wheezing.         [name] ALPRAZolam (XANAX) 0.25 MG tablet   Oral   Take 0.25 mg by mouth daily as needed. For anxiety.         . carvedilol (COREG) 25 MG tablet      TAKE 1 TABLET (25 MG TOTAL) BY MOUTH 2 (TWO) TIMES DAILY WITH A MEAL.   120 tablet   1   . esomeprazole (NEXIUM) 40 MG capsule   Oral   Take 40 mg by mouth 2 (two) times daily.         . fluticasone (FLONASE) 50 MCG/ACT nasal spray   Nasal   Place 2 sprays into the nose daily.   16 g   2   . Fluticasone-Salmeterol (ADVAIR DISKUS) 250-50 MCG/DOSE AEPB   Inhalation   Inhale 1 puff into the lungs 2 (two) times  daily.   3 each   1   . furosemide (LASIX) 40 MG tablet   Oral   Take 40-80 mg by mouth daily. Takes 1 tablet everyday; then based on weight gain from previous day, if >2 lbs. Take 1 additional tablet as needed.         . hydrALAZINE (APRESOLINE) 25 MG tablet   Oral   Take 25 mg by mouth 3 (three) times daily.         [name] HYDROcodone-acetaminophen (VICODIN) 5-500 MG per tablet   Oral   Take 1 tablet by mouth every 6 (six) hours as needed. For pain.         [name] KLOR-CON M10 10 MEQ tablet      TAKE 1 TABLET (10 MEQ TOTAL) BY MOUTH DAILY.   30 tablet   3   . losartan (COZAAR) 50 MG tablet   Oral   Take 50 mg by mouth 2 (two) times daily.         [name] EXPIRED: nitroGLYCERIN (NITROSTAT) 0.4 MG SL tablet   Sublingual   Place 1 tablet (0.4 mg total) under the tongue every 5 (five) minutes as needed for chest pain.   25 tablet   6   . Rivaroxaban (XARELTO) 20 MG TABS   Oral   Take 20 mg by mouth daily.   90 tablet   3   . Tamsulosin HCl (FLOMAX) 0.4 MG CAPS   Oral   Take 0.4 mg by mouth 2 (two) times daily.            BP 123/65  Pulse 66  Temp(Src) 98 F (36.7 C)  SpO2 94%  Physical Exam  Nursing note and vitals reviewed. Constitutional: He is oriented to person, place, and time. He appears well-developed and well-nourished.  Musculoskeletal: He exhibits no tenderness.  Neurological: He is alert and oriented to person, place, and time.  Skin: Skin is warm and dry.  Lac healed, 8 stitches removed. No problems.    ED Course  Procedures (including critical care time)  Labs Reviewed - No data to display No results found.   1. Encounter for removal of sutures       MDM          Linna Hoff, MD 07/14/12 1138

## 2012-07-28 ENCOUNTER — Other Ambulatory Visit: Payer: Self-pay | Admitting: Physician Assistant

## 2012-08-06 ENCOUNTER — Other Ambulatory Visit: Payer: Self-pay | Admitting: Cardiology

## 2012-08-16 ENCOUNTER — Telehealth: Payer: Self-pay | Admitting: Cardiology

## 2012-08-16 MED ORDER — HYDRALAZINE HCL 25 MG PO TABS: ORAL_TABLET | ORAL | Status: DC

## 2012-08-16 NOTE — Telephone Encounter (Signed)
Please ask him to reduce the hydralazine to 12.5 tid and see if this helps.

## 2012-08-16 NOTE — Telephone Encounter (Signed)
New problem    B/p running very low

## 2012-08-16 NOTE — Telephone Encounter (Signed)
Called patient back. He states that he has felt very tired the past few days. He checked his BP at home the past few days and it has been 100/55 to 65 with a heart rate in the 60's. His meds are the following: Coreg 25 mg BID Hydralazine 25 mg TID Cozaar 50 mg every day Flomax 0.mg BID  Advised will review with Dr.Hochrein for medication recommendation and call him back

## 2012-08-16 NOTE — Telephone Encounter (Signed)
Called patient back. He will reduce Hydralazine to 12.5mg  3 times per day. Will call us back if BP not better.

## 2012-08-25 ENCOUNTER — Ambulatory Visit (INDEPENDENT_AMBULATORY_CARE_PROVIDER_SITE_OTHER): Payer: Medicare Other | Admitting: Internal Medicine

## 2012-08-25 ENCOUNTER — Ambulatory Visit: Payer: Medicare Other

## 2012-08-25 ENCOUNTER — Other Ambulatory Visit: Payer: Self-pay | Admitting: Radiology

## 2012-08-25 ENCOUNTER — Encounter: Payer: Self-pay | Admitting: Internal Medicine

## 2012-08-25 VITALS — BP 136/72 | HR 64 | Temp 97.4°F | Resp 18 | Ht 70.5 in | Wt 183.0 lb

## 2012-08-25 DIAGNOSIS — J441 Chronic obstructive pulmonary disease with (acute) exacerbation: Secondary | ICD-10-CM

## 2012-08-25 DIAGNOSIS — R042 Hemoptysis: Secondary | ICD-10-CM

## 2012-08-25 MED ORDER — LEVOFLOXACIN 500 MG PO TABS: 500.0000 mg | ORAL_TABLET | Freq: Every day | ORAL | Status: DC

## 2012-08-25 MED ORDER — BENZONATATE 100 MG PO CAPS: 100.0000 mg | ORAL_CAPSULE | Freq: Two times a day (BID) | ORAL | Status: DC | PRN

## 2012-08-25 NOTE — Progress Notes (Signed)
  Subjective:    Patient ID: Andrew [name], male    DOB: [birth year], 79 y.o.   MRN: [id number]  HPIconcerned by Blood he coughed up over last 2 days COPD-is usually SOB O2 6 hrs at night advair bid Neb tid flonase  Review of Systems No wt loss No fever ,sweats No increase of shortness of breath    Objective:   Physical Exam BP 136/72  Pulse 64  Temp(Src) 97.4 F (36.3 C) (Oral)  Resp 18  Ht 5' 10.5" (1.791 m)  Wt 183 lb (83.008 kg)  BMI 25.88 kg/m2  SpO2 92%= about usual for him TMs clear Nares clear Throat clear Specimen is clearly blood tinged sputum/ Heart regular/pacemaker       [facility] reading (PRIMARY) by  Dr. Merla Riches ?RLL increased markings//similar to old xray 08-19-11 tho more pronounced   Assessment & Plan:  #1 hemoptysis #2 COPD  Chest x-ray to radiologist Meds ordered this encounter  Medications  . benzonatate (TESSALON) 100 MG capsule    Sig: Take 1 capsule (100 mg total) by mouth 2 (two) times daily as needed for cough.    Dispense:  20 capsule    Refill:  0  . levofloxacin (LEVAQUIN) 500 MG tablet    Sig: Take 1 tablet (500 mg total) by mouth daily.    Dispense:  7 tablet    Refill:  0    Tessalon Perles Levaquin 500 Notify x-ray results

## 2012-08-25 NOTE — Telephone Encounter (Signed)
Sent in Rx to [facility], [facility]. [facility] to [facility]

## 2012-09-25 ENCOUNTER — Other Ambulatory Visit: Payer: Self-pay | Admitting: Cardiology

## 2012-09-25 ENCOUNTER — Other Ambulatory Visit: Payer: Self-pay | Admitting: Emergency Medicine

## 2012-09-25 ENCOUNTER — Other Ambulatory Visit: Payer: Self-pay | Admitting: Physician Assistant

## 2012-09-26 ENCOUNTER — Other Ambulatory Visit: Payer: Self-pay

## 2012-09-26 MED ORDER — CARVEDILOL 25 MG PO TABS: ORAL_TABLET | ORAL | Status: DC

## 2012-10-02 ENCOUNTER — Ambulatory Visit (INDEPENDENT_AMBULATORY_CARE_PROVIDER_SITE_OTHER): Payer: Medicare Other | Admitting: *Deleted

## 2012-10-02 ENCOUNTER — Telehealth: Payer: Self-pay

## 2012-10-02 ENCOUNTER — Encounter: Payer: Self-pay | Admitting: Internal Medicine

## 2012-10-02 DIAGNOSIS — I255 Ischemic cardiomyopathy: Secondary | ICD-10-CM

## 2012-10-02 DIAGNOSIS — I5022 Chronic systolic (congestive) heart failure: Secondary | ICD-10-CM

## 2012-10-02 DIAGNOSIS — Z950 Presence of cardiac pacemaker: Secondary | ICD-10-CM

## 2012-10-02 DIAGNOSIS — I2589 Other forms of chronic ischemic heart disease: Secondary | ICD-10-CM

## 2012-10-02 MED ORDER — ALBUTEROL SULFATE (2.5 MG/3ML) 0.083% IN NEBU: 2.5000 mg | INHALATION_SOLUTION | Freq: Four times a day (QID) | RESPIRATORY_TRACT | Status: DC | PRN

## 2012-10-02 MED ORDER — IPRATROPIUM BROMIDE 0.02 % IN SOLN: 500.0000 ug | Freq: Four times a day (QID) | RESPIRATORY_TRACT | Status: DC

## 2012-10-02 NOTE — Telephone Encounter (Signed)
Which supplies does he require? He states he needs Albuterol and Atrovent these are pended please advise.

## 2012-10-02 NOTE — Telephone Encounter (Signed)
Patient would like a nurse to call him regarding getting his supplies from [facility] please call patient at [phone]

## 2012-10-02 NOTE — Telephone Encounter (Signed)
Rx printed and signed at TL desk

## 2012-10-03 ENCOUNTER — Telehealth: Payer: Self-pay | Admitting: *Deleted

## 2012-10-03 LAB — REMOTE PACEMAKER DEVICE
ATRIAL PACING PM: 1.78
BAMS-0001: 170 {beats}/min
BATTERY VOLTAGE: 3.0119 V
RV LEAD IMPEDENCE PM: 494 Ohm
RV LEAD THRESHOLD: 0.375 V
VENTRICULAR PACING PM: 99.63

## 2012-10-03 NOTE — Telephone Encounter (Signed)
Faxed to [facility]

## 2012-10-03 NOTE — Telephone Encounter (Signed)
Faxed signed prescription for nebulizer to [facility], per Dr Cleta Alberts. Confirmation page received.

## 2012-10-09 ENCOUNTER — Telehealth: Payer: Self-pay

## 2012-10-09 ENCOUNTER — Other Ambulatory Visit: Payer: Self-pay

## 2012-10-09 MED ORDER — ESOMEPRAZOLE MAGNESIUM 40 MG PO CPDR: 40.0000 mg | DELAYED_RELEASE_CAPSULE | Freq: Two times a day (BID) | ORAL | Status: DC

## 2012-10-09 NOTE — Telephone Encounter (Signed)
Pt is calling because he states that [facility] has been trying to get in touch with Dr. Cleta Alberts about getting verification on a certain prescription Call back number [phone]

## 2012-10-10 NOTE — Telephone Encounter (Signed)
Notified pt that Nexium Rx was sent in yesterday and that he is due for f/up before next RF. Pt agreed.

## 2012-10-11 ENCOUNTER — Encounter: Payer: Self-pay | Admitting: *Deleted

## 2012-11-07 ENCOUNTER — Ambulatory Visit: Payer: Medicare Other

## 2012-11-07 ENCOUNTER — Ambulatory Visit (INDEPENDENT_AMBULATORY_CARE_PROVIDER_SITE_OTHER): Payer: Medicare Other | Admitting: Family Medicine

## 2012-11-07 VITALS — BP 148/62 | HR 77 | Temp 98.1°F | Resp 20 | Ht 70.5 in | Wt 185.0 lb

## 2012-11-07 DIAGNOSIS — R0602 Shortness of breath: Secondary | ICD-10-CM

## 2012-11-07 DIAGNOSIS — Z8619 Personal history of other infectious and parasitic diseases: Secondary | ICD-10-CM

## 2012-11-07 DIAGNOSIS — J441 Chronic obstructive pulmonary disease with (acute) exacerbation: Secondary | ICD-10-CM

## 2012-11-07 LAB — POCT URINALYSIS DIPSTICK
Ketones, UA: NEGATIVE
Leukocytes, UA: NEGATIVE
Nitrite, UA: NEGATIVE
Protein, UA: 100
pH, UA: 6.5

## 2012-11-07 LAB — POCT UA - MICROSCOPIC ONLY
Casts, Ur, LPF, POC: NEGATIVE
Crystals, Ur, HPF, POC: NEGATIVE
Epithelial cells, urine per micros: NEGATIVE
Yeast, UA: NEGATIVE

## 2012-11-07 LAB — POCT CBC
HCT, POC: 31.6 % — AB (ref 43.5–53.7)
Lymph, poc: 1 (ref 0.6–3.4)
MCHC: 29.7 g/dL — AB (ref 31.8–35.4)
MCV: 90.7 fL (ref 80–97)
POC Granulocyte: 5.3 (ref 2–6.9)
POC LYMPH PERCENT: 14.8 %L (ref 10–50)
RDW, POC: 17.2 %

## 2012-11-07 LAB — BASIC METABOLIC PANEL
BUN: 23 mg/dL (ref 6–23)
Calcium: 8.6 mg/dL (ref 8.4–10.5)
Creat: 1.59 mg/dL — ABNORMAL HIGH (ref 0.50–1.35)
Glucose, Bld: 102 mg/dL — ABNORMAL HIGH (ref 70–99)

## 2012-11-07 LAB — BRAIN NATRIURETIC PEPTIDE: Brain Natriuretic Peptide: 468.7 pg/mL — ABNORMAL HIGH (ref 0.0–100.0)

## 2012-11-07 NOTE — Patient Instructions (Addendum)
I will contact you later today with the results of your potassium and heart failure tests.  If you have any other problems please let me know.   For the time being continue taking increased lasix (fluid pill) 3 or 4 pills a day I will advise you about your potassium regimen when your labs come in later today.   Please schedule a follow- up with your cardiology office soon.

## 2012-11-07 NOTE — Progress Notes (Signed)
[facility] [address] [place] [address] [phone]  Date:  11/07/2012   Name:  Andrew [name]   DOB:  Jun 29, 1930   MRN:  [id number]  PCP:  Lucilla Edin, MD    Chief Complaint: Shortness of Breath   History of Present Illness:  Andrew [name] is a 79 y.o. very pleasant male patient who presents with the following:  He is here today with increased SOB, perhaps due to CHF exacerbation.  He has noted increased SOB for 3 or 4 days.  Last had this problem in February of this year, and was treated with increased doses of lasix and potassium.  He did get better and felt well in between times.  In January of this year he was in the hospital with pneumonia and enterococcal bacteremia.  As of Monday he started taking increased dosage of his 40 mg lasix- he usually takes one pill, skips a day and takes 2 pills the second day.  Right now he is taking 4 pills a day for 160 mg total, he has done so for about 4 days.  He has also restarted his potasium- he is taking 20 meq TID.  He remembered that when he takes lasix he needs to increase his potassium.   No chest pain.  No fever.  Has not noted any flu like symptoms.    He has noted a mild cough, non- productive.   No swelling of his feet and legs.  However, he does note that his feet and legs feel numb all the time for the lasst 6- 12 months.   He does not have opthopnea- he uses one pillow at night, no change He uses oxygen at home at night only.  States his home O2 sat on room air is usually about 93%  He uses albuterol/ atrovent nebs at home TID as well as advair for his COPD.  He does not smoke anymore He does have a history of atrial flutter, ischemic cardiomyopathy, and CAD.  He had a pacemaker placed 3/13 and takes xarelto for chronic anticoagulation   He has noted increased urine flow since he increased his lasix.  However, no unusual dysuria or urine odor.  On flomax.   Wt Readings from Last 3 Encounters:   11/07/12 185 lb (83.915 kg)  08/25/12 183 lb (83.008 kg)  06/29/12 181 lb (82.101 kg)     Patient Active Problem List   Diagnosis Date Noted  . COPD with acute exacerbation 05/06/2012  . Sepsis 05/05/2012  . Community acquired pneumonia 05/04/2012  . Acute on chronic congestive heart failure 05/04/2012  . S/P biventricular cardiac pacemaker-[employer] 06/17/2011  . Acute renal insufficiency 04/06/2011  . Atrial fibrillation 04/06/2011  . Coronary atherosclerosis of native coronary artery 01/15/2011  . Hyperlipidemia 11/26/2010  . Paroxysmal ventricular tachycardia 11/05/2010  . Ischemic cardiomyopathy 10/14/2010  . Atrial flutter   . Chronic systolic heart failure   . Hypertension   . Nonspecific (abnormal) findings on radiological and other examination of body structure 01/12/2010  . ABNORMAL LUNG XRAY 01/12/2010  . COPD UNSPECIFIED 12/02/2009  . DYSPNEA 12/02/2009  . LIVER FUNCTION TESTS, ABNORMAL, HX OF 12/02/2009  . UNSPECIFIED ANEMIA 05/05/2009  . GERD 04/04/2007  . PERSONAL HISTORY OF COLONIC POLYPS 04/04/2007    Past Medical History  Diagnosis Date  . Chronic systolic heart failure     a. Chronic LV systolic failure. b. Echo in Dec 2011 showed EF of 25% - f/u  echo 02/2011: Mild LVH, EF 15%, posterior lateral akinesis, inferior akinesis, grade 1 diastolic dysfunction, mild LAE. c. s/p BiV pacemaker 06/2011.  [name] Atrial flutter     a. Off amiodarone because of increased LFTs  . COPD (chronic obstructive pulmonary disease)     Chronic SOB, O2 dependent  . Myocardial infarction 1994  . Gastro - esophageal reflux   . Claudication   . BPH (benign prostatic hyperplasia)   . Coronary artery disease     a. Myocardial infarction in 1984,  99 and 07. b. H/o multiple RCA stents. c.  LHC 04/2011: without obstructive disease. oLM 25%, mLAD 30%, pCFX 25%, prox to mid RCA stent ok, dRCA 30% and 70%, pPDA 25%, mPL 25-30%, EF 25%.  [name] Hypertension   . Atrial fibrillation     a. On  Xarelto.  . Hemorrhoids   . Hyperlipidemia     statin intolerant 2/2 myalgias  . Hx of adenomatous colonic polyps   . Hx of colonoscopy   . Chronic renal insufficiency   . Biventricular cardiac pacemaker in situ 3.2013  . NSVT (nonsustained ventricular tachycardia)     a. 04/2012- 6 beats on tele.  [name] Shortness of breath   . Arthritis     hips ,back ,hands    Past Surgical History  Procedure Laterality Date  . Cardiac catheterization  2008  . Cataract extraction    . Salivary gland surgery    . Coronary angioplasty with stent placement    . Insert / replace / remove pacemaker  06/16/2011  . Tee without cardioversion  05/08/2012    Procedure: TRANSESOPHAGEAL ECHOCARDIOGRAM (TEE);  Surgeon: Vesta Mixer, MD;  Location[facility] ENDOSCOPY;  Service: Cardiovascular;  Laterality: N/A;    History  Substance Use Topics  . Smoking status: Former Smoker -- 1.50 packs/day for 55 years    Quit date: 01/03/2009  . Smokeless tobacco: Never Used  . Alcohol Use: Yes     Comment: occasional    Family History  Problem Relation Age of Onset  . Coronary artery disease Father   . Heart disease Mother   . Colon cancer Neg Hx   . Heart disease Brother     Allergies  Allergen Reactions  . Codeine Nausea And Vomiting  . Inspra (Eplerenone) Other (See Comments)    Stomach problems  . Spironolactone Other (See Comments)    Gynecomastia  . Doxycycline Itching and Rash  . Sulfonamide Derivatives Itching and Rash    Medication list has been reviewed and updated.  Current Outpatient Prescriptions on File Prior to Visit  Medication Sig Dispense Refill  . albuterol (2.5 MG/3ML) 0.083% NEBU 3 mL, albuterol (5 MG/ML) 0.5% NEBU 0.5 mL Inhale 2.5 mg into the lungs 3 (three) times daily as needed. For shortness of breath.      [name] albuterol (PROVENTIL) (2.5 MG/3ML) 0.083% nebulizer solution Take 3 mLs (2.5 mg total) by nebulization every 6 (six) hours as needed for wheezing.  75 mL  12  . ALPRAZolam  (XANAX) 0.25 MG tablet Take 0.25 mg by mouth daily as needed. For anxiety.      . carvedilol (COREG) 25 MG tablet TAKE 1 TABLET (25 MG TOTAL) BY MOUTH 2 (TWO) TIMES DAILY WITH A MEAL.  120 tablet  1  . esomeprazole (NEXIUM) 40 MG capsule Take 1 capsule (40 mg total) by mouth 2 (two) times daily. PATIENT NEEDS OFFICE VISIT FOR ADDITIONAL REFILLS  60 capsule  0  . fluticasone (FLONASE) 50 MCG/ACT  nasal spray Place 2 sprays into the nose daily.  16 g  2  . Fluticasone-Salmeterol (ADVAIR DISKUS) 250-50 MCG/DOSE AEPB Inhale 1 puff into the lungs 2 (two) times daily.  3 each  1  . Fluticasone-Salmeterol (ADVAIR DISKUS) 250-50 MCG/DOSE AEPB Inhale 1 puff into the lungs 2 (two) times daily.  3 each  3  . furosemide (LASIX) 40 MG tablet Take 40-80 mg by mouth daily. Takes 1 tablet everyday; then based on weight gain from previous day, if >2 lbs. Take 1 additional tablet as needed.      . hydrALAZINE (APRESOLINE) 25 MG tablet TAKE 1 TABLET THREE TIMES A DAY  270 tablet  2  . HYDROcodone-acetaminophen (VICODIN) 5-500 MG per tablet Take 1 tablet by mouth every 6 (six) hours as needed. For pain.      [name] ipratropium (ATROVENT) 0.02 % nebulizer solution Take 2.5 mLs (500 mcg total) by nebulization 4 (four) times daily.  75 mL  12  . KLOR-CON M10 10 MEQ tablet TAKE 1 TABLET (10 MEQ TOTAL) BY MOUTH DAILY.  30 tablet  3  . losartan (COZAAR) 50 MG tablet Take 50 mg by mouth 2 (two) times daily.      [name] NITROSTAT 0.4 MG SL tablet PLACE 1 TABLET (0.4 MG TOTAL) UNDER THE TONGUE EVERY 5 (FIVE) MINUTES AS NEEDED FOR CHEST PAIN.  25 tablet  1  . XARELTO 20 MG TABS TAKE 1 TABLET DAILY  90 tablet  3  . benzonatate (TESSALON) 100 MG capsule Take 1 capsule (100 mg total) by mouth 2 (two) times daily as needed for cough.  20 capsule  0  . levofloxacin (LEVAQUIN) 500 MG tablet Take 1 tablet (500 mg total) by mouth daily.  7 tablet  0  . PROAIR HFA 108 (90 BASE) MCG/ACT inhaler INHALE 1 TO 2 PUFFS EVERY 6 HOURS AS NEEDED  1 each  3   . Tamsulosin HCl (FLOMAX) 0.4 MG CAPS Take 0.4 mg by mouth 2 (two) times daily.       . [DISCONTINUED] metoprolol tartrate (LOPRESSOR) 25 MG tablet Take 1/2 tablet every other day   45 tablet  3   No current facility-administered medications on file prior to visit.    Review of Systems:  As per HPI- otherwise negative.   Physical Examination: Filed Vitals:   11/07/12 1356  BP: 148/62  Pulse: 77  Temp: 98.1 F (36.7 C)  Resp: 20   Filed Vitals:   11/07/12 1356  Height: 5' 10.5" (1.791 m)  Weight: 185 lb (83.915 kg)   Body mass index is 26.16 kg/(m^2). Ideal Body Weight: Weight in (lb) to have BMI = 25: 176.4  GEN: WDWN, NAD, Non-toxic, Alert, appears stated age but well HEENT: Atraumatic, Normocephalic. Neck supple. No masses, No LAD.  Bilateral TM wnl, oropharynx normal.  PEERL,EOMI.   Ears and Nose: No external deformity. CV: RRR, No M/G/R. No JVD. No thrill. No extra heart sounds. PULM: CTA B, no wheezes, crackles, rhonchi. No retractions. No resp. distress. No accessory muscle use. ABD: S, NT, ND. No rebound. No HSM. EXTR: No c/c, no edema NEURO Normal gait for age PSYCH: Normally interactive. Conversant. Not depressed or anxious appearing.  Calm demeanor.   [facility] reading (PRIMARY) by  Dr. Patsy Lager. CXR: pacemaker in place, do not appreciate any effusion or infiltrate  CHEST - 2 VIEW  Comparison: 08/25/2012  Findings: Cardiomediastinal silhouette is stable. Three leads cardiac pacemaker is unchanged in position. No acute infiltrate or pulmonary edema.  Hyperinflation again noted. Stable chronic reticulonodular interstitial prominence. No segmental infiltrate or pulmonary edema.  IMPRESSION: No active disease. Stable chronic reticulonodular interstitial prominence.  Clinically significant discrepancy from primary report, if provided: None  EKG: paced rhythm  Results for orders placed in visit on 11/07/12  POCT CBC      Result Value Range   WBC 7.0  4.6  - 10.2 K/uL   Lymph, poc 1.0  0.6 - 3.4   POC LYMPH PERCENT 14.8  10 - 50 %L   MID (cbc) 0.7  0 - 0.9   POC MID % 9.4  0 - 12 %M   POC Granulocyte 5.3  2 - 6.9   Granulocyte percent 75.8  37 - 80 %G   RBC 3.48 (*) 4.69 - 6.13 M/uL   Hemoglobin 9.4 (*) 14.1 - 18.1 g/dL   HCT, POC 13.2 (*) 44.0 - 53.7 %   MCV 90.7  80 - 97 fL   MCH, POC 27.0  27 - 31.2 pg   MCHC 29.7 (*) 31.8 - 35.4 g/dL   RDW, POC 10.2     Platelet Count, POC 336  142 - 424 K/uL   MPV 8.1  0 - 99.8 fL  POCT UA - MICROSCOPIC ONLY      Result Value Range   WBC, Ur, HPF, POC 0-1     RBC, urine, microscopic 0-4     Bacteria, U Microscopic trace     Mucus, UA neg     Epithelial cells, urine per micros neg     Crystals, Ur, HPF, POC neg     Casts, Ur, LPF, POC neg     Yeast, UA neg    POCT URINALYSIS DIPSTICK      Result Value Range   Color, UA yellow     Clarity, UA clear     Glucose, UA neg     Bilirubin, UA neg     Ketones, UA neg     Spec Grav, UA 1.015     Blood, UA trace-lysed     pH, UA 6.5     Protein, UA 100     Urobilinogen, UA 0.2     Nitrite, UA neg     Leukocytes, UA Negative     Hg 2/14- 9.9  Assessment and Plan: COPD exacerbation - Plan: POCT CBC, Basic metabolic panel, DG Chest 2 View  SOB (shortness of breath) - Plan: Brain natriuretic peptide, DG Chest 2 View, EKG 12-Lead  History of sepsis - Plan: Urine culture, POCT UA - Microscopic Only, POCT urinalysis dipstick, Anaerobic culture, AFB culture, blood  Gleason is here today with SOB for the lsst few days.  His weight is up a few lbs.  No orthopnea or pedal edema.  He has already increased his lasix to 160 mg a day for the last 3 days, and has also increased his potassium to 60 meg daily.  Consider COPD exacerbation as well.  PE unlikely as he is on xarelto.   Await his BMP and BNP for further information.  Also, given his recent history of bacteremia/ sepsis will add blood cultures and a urine culture.    Signed Abbe Amsterdam,  MD  Received his BNP and BMP.  It does appear that he has a CHF exacerbation- BNP is 468.  His potassium is ok at 5.1, creat is a little elevated.  Will give him a call in the morning.

## 2012-11-08 ENCOUNTER — Encounter: Payer: Self-pay | Admitting: Internal Medicine

## 2012-11-08 ENCOUNTER — Ambulatory Visit (INDEPENDENT_AMBULATORY_CARE_PROVIDER_SITE_OTHER): Payer: Medicare Other | Admitting: Internal Medicine

## 2012-11-08 ENCOUNTER — Other Ambulatory Visit: Payer: Self-pay

## 2012-11-08 ENCOUNTER — Telehealth: Payer: Self-pay | Admitting: Radiology

## 2012-11-08 VITALS — BP 112/55 | HR 73 | Ht 71.0 in | Wt 182.4 lb

## 2012-11-08 DIAGNOSIS — I4891 Unspecified atrial fibrillation: Secondary | ICD-10-CM

## 2012-11-08 DIAGNOSIS — R0602 Shortness of breath: Secondary | ICD-10-CM

## 2012-11-08 DIAGNOSIS — I2589 Other forms of chronic ischemic heart disease: Secondary | ICD-10-CM

## 2012-11-08 DIAGNOSIS — I255 Ischemic cardiomyopathy: Secondary | ICD-10-CM

## 2012-11-08 DIAGNOSIS — J441 Chronic obstructive pulmonary disease with (acute) exacerbation: Secondary | ICD-10-CM

## 2012-11-08 DIAGNOSIS — I509 Heart failure, unspecified: Secondary | ICD-10-CM

## 2012-11-08 DIAGNOSIS — Z950 Presence of cardiac pacemaker: Secondary | ICD-10-CM

## 2012-11-08 DIAGNOSIS — I2510 Atherosclerotic heart disease of native coronary artery without angina pectoris: Secondary | ICD-10-CM

## 2012-11-08 LAB — PACEMAKER DEVICE OBSERVATION
ATRIAL PACING PM: 2.09
RV LEAD IMPEDENCE PM: 475 Ohm
RV LEAD THRESHOLD: 0.5 V
VENTRICULAR PACING PM: 99.66

## 2012-11-08 NOTE — Telephone Encounter (Signed)
Corinda Gubler is going to work patient in today at 1:45pm. Called patient to advise. Spoke to the patients wife to advise. They are asking to wait until Friday, advised, no ,he is to go today. To you FYI

## 2012-11-08 NOTE — Telephone Encounter (Signed)
Patient has CHF exacerbation, was seen by Dr Patsy Lager, is there any way patient can be worked in with your office today? Below is from office visit with Dr Patsy Lager.  COPD exacerbation - Plan: POCT CBC, Basic metabolic panel, DG Chest 2 View  SOB (shortness of breath) - Plan: Brain natriuretic peptide, DG Chest 2 View, EKG 12-Lead  History of sepsis - Plan: Urine culture, POCT UA - Microscopic Only, POCT urinalysis dipstick, Anaerobic culture, AFB culture, blood  Andrew [name] is here today with SOB for the lsst few days. His weight is up a few lbs. No orthopnea or pedal edema. He has already increased his lasix to 160 mg a day for the last 3 days, and has also increased his potassium to 60 meg daily. Consider COPD exacerbation as well. PE unlikely as he is on xarelto.    Received his BNP and BMP. It does appear that he has a CHF exacerbation- BNP is 468. His potassium is ok at 5.1, creat is a little elevated.   Please advise phone # [phone] or you can send this message back. Thanks Amy /Dr Copland

## 2012-11-08 NOTE — Patient Instructions (Addendum)
Your physician recommends that you schedule a follow-up appointment in: 1-2 weeks with Norma Fredrickson and 2 months with Dr Antoine Poche  Take 80mg  of Furosemide today tomorrow and take extra Potassium then go back to regular schedule

## 2012-11-12 DIAGNOSIS — R0602 Shortness of breath: Secondary | ICD-10-CM | POA: Insufficient documentation

## 2012-11-12 NOTE — Progress Notes (Signed)
PCP:  Lucilla Edin, MD  The patient presents today for DOD visit.  He has recently had progressive shortness of breath.  He reports that for the past week he has been more short of breath.  He has increased his lasix and found some improvements with this.  He denies fevers or cough.  Today, he denies symptoms of palpitations, chest pain,  orthopnea, PND, lower extremity edema, dizziness, presyncope, syncope, or neurologic sequela.  The patient feels that he is tolerating medications without difficulties and is otherwise without complaint today.   Past Medical History  Diagnosis Date  . Chronic systolic heart failure     a. Chronic LV systolic failure. b. Echo in Dec 2011 showed EF of 25% - f/u echo 02/2011: Mild LVH, EF 15%, posterior lateral akinesis, inferior akinesis, grade 1 diastolic dysfunction, mild LAE. c. s/p BiV pacemaker 06/2011.  [name] Atrial flutter     a. Off amiodarone because of increased LFTs  . COPD (chronic obstructive pulmonary disease)     Chronic SOB, O2 dependent  . Myocardial infarction 1994  . Gastro - esophageal reflux   . Claudication   . BPH (benign prostatic hyperplasia)   . Coronary artery disease     a. Myocardial infarction in 1984,  99 and 07. b. H/o multiple RCA stents. c.  LHC 04/2011: without obstructive disease. oLM 25%, mLAD 30%, pCFX 25%, prox to mid RCA stent ok, dRCA 30% and 70%, pPDA 25%, mPL 25-30%, EF 25%.  [name] Hypertension   . Atrial fibrillation     a. On Xarelto.  . Hemorrhoids   . Hyperlipidemia     statin intolerant 2/2 myalgias  . Hx of adenomatous colonic polyps   . Hx of colonoscopy   . Chronic renal insufficiency   . Biventricular cardiac pacemaker in situ 3.2013  . NSVT (nonsustained ventricular tachycardia)     a. 04/2012- 6 beats on tele.  [name] Shortness of breath   . Arthritis     hips ,back ,hands   Past Surgical History  Procedure Laterality Date  . Cardiac catheterization  2008  . Cataract extraction    . Salivary gland  surgery    . Coronary angioplasty with stent placement    . Insert / replace / remove pacemaker  06/16/2011  . Tee without cardioversion  05/08/2012    Procedure: TRANSESOPHAGEAL ECHOCARDIOGRAM (TEE);  Surgeon: Vesta Mixer, MD;  Location: [facility] ENDOSCOPY;  Service: Cardiovascular;  Laterality: N/A;    Current Outpatient Prescriptions  Medication Sig Dispense Refill  . albuterol (2.5 MG/3ML) 0.083% NEBU 3 mL, albuterol (5 MG/ML) 0.5% NEBU 0.5 mL Inhale 2.5 mg into the lungs 4 (four) times daily. For shortness of breath.      [name] albuterol (PROVENTIL) (2.5 MG/3ML) 0.083% nebulizer solution Take 3 mLs (2.5 mg total) by nebulization every 6 (six) hours as needed for wheezing.  75 mL  12  . ALPRAZolam (XANAX) 0.25 MG tablet Take 0.25 mg by mouth daily as needed. For anxiety.      . carvedilol (COREG) 25 MG tablet TAKE 1 TABLET (25 MG TOTAL) BY MOUTH 2 (TWO) TIMES DAILY WITH A MEAL.  120 tablet  1  . esomeprazole (NEXIUM) 40 MG capsule Take 1 capsule (40 mg total) by mouth 2 (two) times daily. PATIENT NEEDS OFFICE VISIT FOR ADDITIONAL REFILLS  60 capsule  0  . fluticasone (FLONASE) 50 MCG/ACT nasal spray Place 2 sprays into the nose daily.  16 g  2  .  Fluticasone-Salmeterol (ADVAIR DISKUS) 250-50 MCG/DOSE AEPB Inhale 1 puff into the lungs 2 (two) times daily.  3 each  3  . furosemide (LASIX) 40 MG tablet Take 40-80 mg by mouth daily. Takes 1 tablet everyday; then based on weight gain from previous day, if >2 lbs. Take 1 additional tablet as needed.      . hydrALAZINE (APRESOLINE) 25 MG tablet       . HYDROcodone-acetaminophen (NORCO/VICODIN) 5-325 MG per tablet Take 1 tablet by mouth every 6 (six) hours as needed for pain.      [name] ipratropium (ATROVENT) 0.02 % nebulizer solution Take 2.5 mLs (500 mcg total) by nebulization 4 (four) times daily.  75 mL  12  . losartan (COZAAR) 50 MG tablet Take 50 mg by mouth 2 (two) times daily.      [name] NITROSTAT 0.4 MG SL tablet PLACE 1 TABLET (0.4 MG TOTAL) UNDER THE  TONGUE EVERY 5 (FIVE) MINUTES AS NEEDED FOR CHEST PAIN.  25 tablet  1  . potassium chloride (KLOR-CON M10) 10 MEQ tablet       . PROAIR HFA 108 (90 BASE) MCG/ACT inhaler INHALE 1 TO 2 PUFFS EVERY 6 HOURS AS NEEDED  1 each  3  . Tamsulosin HCl (FLOMAX) 0.4 MG CAPS Take 0.4 mg by mouth 2 (two) times daily.       [name] 20 MG TABS TAKE 1 TABLET DAILY  90 tablet  3  . [DISCONTINUED] metoprolol tartrate (LOPRESSOR) 25 MG tablet Take 1/2 tablet every other day   45 tablet  3   No current facility-administered medications for this visit.    Allergies  Allergen Reactions  . Codeine Nausea And Vomiting  . Inspra (Eplerenone) Other (See Comments)    Stomach problems  . Spironolactone Other (See Comments)    Gynecomastia  . Doxycycline Itching and Rash  . Sulfonamide Derivatives Itching and Rash    History   Social History  . Marital Status: Married    Spouse Name: N/A    Number of Children: N/A  . Years of Education: N/A   Occupational History  . retired Designer, industrial/product work    Social History Main Topics  . Smoking status: Former Smoker -- 1.50 packs/day for 55 years    Quit date: 01/03/2009  . Smokeless tobacco: Never Used  . Alcohol Use: Yes     Comment: occasional  . Drug Use: No  . Sexually Active: Not Currently   Other Topics Concern  . Not on file   Social History Narrative  . No narrative on file    Family History  Problem Relation Age of Onset  . Coronary artery disease Father   . Heart disease Mother   . Colon cancer Neg Hx   . Heart disease Brother     ROS-  All systems are reviewed and are negative except as outlined in the HPI above  Physical Exam: Filed Vitals:   11/08/12 1416  BP: 112/55  Pulse: 73  Height: 5\' 11"  (1.803 m)  Weight: 182 lb 6.4 oz (82.736 kg)    GEN- The patient is elderly appearing, alert and oriented x 3 today.   Head- normocephalic, atraumatic Eyes-  Sclera clear, conjunctiva pink Ears- hearing intact Oropharynx-  clear Neck- supple,JVP 7cm Lungs- prolonged expiratory phase, no wheezes or rales, normal work of breathing Heart- Regular rate and rhythm  GI- soft, NT, ND, + BS Extremities- no clubbing, cyanosis, +edema MS- age appropriate atrophy Skin- no rash or lesion Psych- euthymic  mood, full affect Neuro- strength and sensation are intact  BiV pacemakar interrogation is reviewed and normal.  No atrial arrhythmias.  BiV paced 100%.  Optivol is not elevated  Assessment and Plan:  1. Shortness of breath I am not convinced that this is all cardiac.  Exam today is unremarkable and his weight is stable.  Device interrogation does not reflect elevation in optivol.  He does admit that with recent increase in lasix that he is much better at this point.   I would therefore recommend that he gradually reduce his lasix back to baseline.  2 gram sodium restriction is advised.   He will continue to follow with primary care for his chronic lung disease.    2. Acute on chronic systolic dysfunction As above  3. COPD As above  4. CAD Continue current medical therapy  Return for followup in the next few weeks

## 2012-11-13 ENCOUNTER — Ambulatory Visit (INDEPENDENT_AMBULATORY_CARE_PROVIDER_SITE_OTHER): Payer: Medicare Other | Admitting: Emergency Medicine

## 2012-11-13 VITALS — BP 120/58 | HR 77 | Temp 98.0°F | Resp 17 | Ht 70.0 in | Wt 183.0 lb

## 2012-11-13 DIAGNOSIS — I5023 Acute on chronic systolic (congestive) heart failure: Secondary | ICD-10-CM

## 2012-11-13 DIAGNOSIS — D649 Anemia, unspecified: Secondary | ICD-10-CM

## 2012-11-13 DIAGNOSIS — R5383 Other fatigue: Secondary | ICD-10-CM

## 2012-11-13 LAB — POCT CBC
HCT, POC: 33.7 % — AB (ref 43.5–53.7)
Hemoglobin: 10.3 g/dL — AB (ref 14.1–18.1)
Lymph, poc: 1.2 (ref 0.6–3.4)
MCH, POC: 27.3 pg (ref 27–31.2)
MCHC: 30.6 g/dL — AB (ref 31.8–35.4)
MCV: 89.4 fL (ref 80–97)
MPV: 8.2 fL (ref 0–99.8)
POC MID %: 7.8 %M (ref 0–12)
WBC: 8.9 10*3/uL (ref 4.6–10.2)

## 2012-11-13 LAB — RETICULOCYTES: ABS Retic: 36.7 10*3/uL (ref 19.0–186.0)

## 2012-11-13 MED ORDER — GNP PRENATAL VITAMINS 28-0.8 MG PO TABS: 1.0000 | ORAL_TABLET | Freq: Every day | ORAL | Status: DC

## 2012-11-13 MED ORDER — ESOMEPRAZOLE MAGNESIUM 40 MG PO CPDR: 40.0000 mg | DELAYED_RELEASE_CAPSULE | Freq: Two times a day (BID) | ORAL | Status: DC

## 2012-11-13 NOTE — Progress Notes (Signed)
  Subjective:    Patient ID: Andrew [name], male    DOB: 1931/03/08, 79 y.o.   MRN: [id number]  HPI  Pt here for a recheck. Saw Dr. Patsy Lager last week for SOB and cough. Pt still feels weak and fatigued. O2 levels at home have been about 93%. Pt was anemic on blood work done last week. No noticeable bleeds anywhere. Last colonoscopy was in 2009.  Review of Systems     Objective:   Physical Exam patient is alert and cooperative he is in no distress. His neck is supple. Chest revealed decreased breath sounds in the bases no rales cardiac revealed a regular rhythm. Extremities are without edema there is significant stasis changes . Results for orders placed in visit on 11/13/12  POCT CBC      Result Value Range   WBC 8.9  4.6 - 10.2 K/uL   Lymph, poc 1.2  0.6 - 3.4   POC LYMPH PERCENT 13.5  10 - 50 %L   MID (cbc) 0.7  0 - 0.9   POC MID % 7.8  0 - 12 %M   POC Granulocyte 7.0 (*) 2 - 6.9   Granulocyte percent 78.7  37 - 80 %G   RBC 3.77 (*) 4.69 - 6.13 M/uL   Hemoglobin 10.3 (*) 14.1 - 18.1 g/dL   HCT, POC 08.6 (*) 57.8 - 53.7 %   MCV 89.4  80 - 97 fL   MCH, POC 27.3  27 - 31.2 pg   MCHC 30.6 (*) 31.8 - 35.4 g/dL   RDW, POC 46.9     Platelet Count, POC 382  142 - 424 K/uL   MPV 8.2  0 - 99.8 fL  IFOBT (OCCULT BLOOD)      Result Value Range   IFOBT Negative           Assessment & Plan:  Patient is in chronic congestive heart failure. He definitely has an unspecified anemia and I am concerned this may be the source of his fatigue. We'll check and rule out multiple myeloma as well as B12 folic acid or iron deficiency. Also had done a repeat count. He will follow up with Dr. Patsy Lager in the next week. He was expecting hemoglobin of 15-17 with his underlying COPD but it may be low due to chronic disease. other illnesses need to be ruled out the

## 2012-11-14 LAB — IRON AND TIBC
TIBC: 223 ug/dL (ref 215–435)
UIBC: 204 ug/dL (ref 125–400)

## 2012-11-14 LAB — MAGNESIUM: Magnesium: 2.4 mg/dL (ref 1.5–2.5)

## 2012-11-15 LAB — IMMUNOFIXATION ELECTROPHORESIS: IgM, Serum: 136 mg/dL (ref 41–251)

## 2012-11-15 LAB — PROTEIN ELECTROPHORESIS, SERUM
Alpha-1-Globulin: 8.8 % — ABNORMAL HIGH (ref 2.9–4.9)
Alpha-2-Globulin: 16.1 % — ABNORMAL HIGH (ref 7.1–11.8)
Beta 2: 6.5 % (ref 3.2–6.5)
Beta Globulin: 5.9 % (ref 4.7–7.2)
Gamma Globulin: 16 % (ref 11.1–18.8)

## 2012-11-18 ENCOUNTER — Encounter: Payer: Self-pay | Admitting: Family Medicine

## 2012-11-24 ENCOUNTER — Ambulatory Visit (INDEPENDENT_AMBULATORY_CARE_PROVIDER_SITE_OTHER): Payer: Medicare Other | Admitting: Nurse Practitioner

## 2012-11-24 ENCOUNTER — Encounter: Payer: Self-pay | Admitting: Nurse Practitioner

## 2012-11-24 VITALS — BP 100/58 | HR 68 | Ht 70.5 in | Wt 181.8 lb

## 2012-11-24 DIAGNOSIS — R0609 Other forms of dyspnea: Secondary | ICD-10-CM

## 2012-11-24 DIAGNOSIS — R0600 Dyspnea, unspecified: Secondary | ICD-10-CM

## 2012-11-24 LAB — BASIC METABOLIC PANEL
BUN: 26 mg/dL — ABNORMAL HIGH (ref 6–23)
CO2: 26 mEq/L (ref 19–32)
Calcium: 8.7 mg/dL (ref 8.4–10.5)
Chloride: 100 mEq/L (ref 96–112)
Creatinine, Ser: 1.6 mg/dL — ABNORMAL HIGH (ref 0.4–1.5)
GFR: 45.02 mL/min — ABNORMAL LOW (ref >60.00)
Glucose, Bld: 99 mg/dL (ref 70–99)
Potassium: 4.7 mEq/L (ref 3.5–5.1)
Sodium: 132 mEq/L — ABNORMAL LOW (ref 135–145)

## 2012-11-24 LAB — CBC WITH DIFFERENTIAL/PLATELET
Basophils Absolute: 0 10*3/uL (ref 0.0–0.1)
Basophils Relative: 0.3 % (ref 0.0–3.0)
Eosinophils Absolute: 0.4 10*3/uL (ref 0.0–0.7)
Eosinophils Relative: 4.8 % (ref 0.0–5.0)
HCT: 29.5 % — ABNORMAL LOW (ref 39.0–52.0)
Hemoglobin: 9.8 g/dL — ABNORMAL LOW (ref 13.0–17.0)
Lymphocytes Relative: 7.2 % — ABNORMAL LOW (ref 12.0–46.0)
Lymphs Abs: 0.6 10*3/uL — ABNORMAL LOW (ref 0.7–4.0)
MCHC: 33.2 g/dL (ref 30.0–36.0)
MCV: 84.1 fl (ref 78.0–100.0)
Monocytes Absolute: 1 10*3/uL (ref 0.1–1.0)
Monocytes Relative: 12.6 % — ABNORMAL HIGH (ref 3.0–12.0)
Neutro Abs: 5.9 10*3/uL (ref 1.4–7.7)
Neutrophils Relative %: 75.1 % (ref 43.0–77.0)
Platelets: 329 10*3/uL (ref 150.0–400.0)
RBC: 3.51 Mil/uL — ABNORMAL LOW (ref 4.22–5.81)
RDW: 16.3 % — ABNORMAL HIGH (ref 11.5–14.6)
WBC: 7.8 10*3/uL (ref 4.5–10.5)

## 2012-11-24 LAB — BRAIN NATRIURETIC PEPTIDE: Pro B Natriuretic peptide (BNP): 433 pg/mL — ABNORMAL HIGH (ref 0.0–100.0)

## 2012-11-24 NOTE — Progress Notes (Signed)
Andrew [name] Date of Birth: [birth year] Medical Record #[id number]  History of Present Illness: Andrew [name] is seen back today for a follow up visit. Seen for Dr. Lily Lovings. He has a very complex medical history which includes chronic systolic heart failure. Now with BiV device in place. On Xarelto for his atrial flutter and off of amiodarone due to increased LFT's in the remote past. Has COPD. On oxygen only at night. Has known CAD with remote MIs and multiple stents. EF is 15% per echo back in 2012. Other issues include HTN, HLD, CRI. Was admitted back earlier in 2014 with sepsis - had ampicillin sensitive enterococcal bacteremia.  His TEE was negative for a vegetation on his device.   I have not seen him since February of 2014. Has seen Dr. Johney Frame earlier this month as a work in visit for dyspnea - had been to UC and was seen there for SOB - felt to be more CHF but interrogation of his device really did not show this and his BNP although elevated was actually improved.  Continues to have progressive dyspnea. Only using his oxygen at night. Has gotten anemic - looks to be iron deficient. Now back on iron therapy.   Comes back today. Here with his wife, Andrew [name]. He remains short of breath with exertion. Was hypoxic with walking in here to the office - sats down to 80%. Has been just using his oxygen at night. No longer seen by Dr. Sherene Sires - looks like he has not seen him in over 2 years. Back on iron. Andrew [name] notes that he may be feeling a little better over the last 2 to 3 days. No chest pain. Does get dizzy. Weight is stable. Uses hydralazine if his pressure is above 140 and otherwise does not take.   Current Outpatient Prescriptions  Medication Sig Dispense Refill  . albuterol (2.5 MG/3ML) 0.083% NEBU 3 mL, albuterol (5 MG/ML) 0.5% NEBU 0.5 mL Inhale 2.5 mg into the lungs 4 (four) times daily. For shortness of breath.      Andrew [name] albuterol (PROVENTIL) (2.5 MG/3ML) 0.083% nebulizer solution Take 3 mLs (2.5 mg  total) by nebulization every 6 (six) hours as needed for wheezing.  75 mL  12  . ALPRAZolam (XANAX) 0.25 MG tablet Take 0.25 mg by mouth daily as needed. For anxiety.      . carvedilol (COREG) 25 MG tablet TAKE 1 TABLET (25 MG TOTAL) BY MOUTH 2 (TWO) TIMES DAILY WITH A MEAL.  120 tablet  1  . esomeprazole (NEXIUM) 40 MG capsule Take 1 capsule (40 mg total) by mouth 2 (two) times daily. PATIENT NEEDS OFFICE VISIT FOR ADDITIONAL REFILLS  180 capsule  3  . Ferrous Sulfate (FE TABS PO) Take 28 mg by mouth daily.      . fluticasone (FLONASE) 50 MCG/ACT nasal spray Place 2 sprays into the nose daily.  16 g  2  . Fluticasone-Salmeterol (ADVAIR DISKUS) 250-50 MCG/DOSE AEPB Inhale 1 puff into the lungs 2 (two) times daily.  3 each  3  . furosemide (LASIX) 40 MG tablet Take 40-60 mg by mouth daily. every other day 40 mg and every other day 60 mg      . hydrALAZINE (APRESOLINE) 25 MG tablet 25 mg as needed. Prn      . HYDROcodone-acetaminophen (NORCO/VICODIN) 5-325 MG per tablet Take 1 tablet by mouth every 6 (six) hours as needed for pain.      Andrew [name] ipratropium (ATROVENT) 0.02 % nebulizer solution Take 2.5  mLs (500 mcg total) by nebulization 4 (four) times daily.  75 mL  12  . losartan (COZAAR) 50 MG tablet Take 50 mg by mouth 2 (two) times daily.      Andrew [name] NITROSTAT 0.4 MG SL tablet PLACE 1 TABLET (0.4 MG TOTAL) UNDER THE TONGUE EVERY 5 (FIVE) MINUTES AS NEEDED FOR CHEST PAIN.  25 tablet  1  . potassium chloride (KLOR-CON M10) 10 MEQ tablet Take 10 mEq by mouth daily. Every other day      . Prenatal Vit-Fe Fumarate-FA (GNP PRENATAL VITAMINS) 28-0.8 MG TABS Take 1 tablet by mouth daily.  90 tablet  3  . PROAIR HFA 108 (90 BASE) MCG/ACT inhaler INHALE 1 TO 2 PUFFS EVERY 6 HOURS AS NEEDED  1 each  3  . Tamsulosin HCl (FLOMAX) 0.4 MG CAPS Take 0.4 mg by mouth 2 (two) times daily.       Andrew [name] 20 MG TABS TAKE 1 TABLET DAILY  90 tablet  3  . [DISCONTINUED] metoprolol tartrate (LOPRESSOR) 25 MG tablet Take 1/2 tablet  every other day   45 tablet  3   No current facility-administered medications for this visit.    Allergies  Allergen Reactions  . Codeine Nausea And Vomiting  . Inspra [Eplerenone] Other (See Comments)    Stomach problems  . Spironolactone Other (See Comments)    Gynecomastia  . Doxycycline Itching and Rash  . Sulfonamide Derivatives Itching and Rash    Past Medical History  Diagnosis Date  . Chronic systolic heart failure     a. Chronic LV systolic failure. b. Echo in Dec 2011 showed EF of 25% - f/u echo 02/2011: Mild LVH, EF 15%, posterior lateral akinesis, inferior akinesis, grade 1 diastolic dysfunction, mild LAE. c. s/p BiV pacemaker 06/2011.  Andrew [name] Atrial flutter     a. Off amiodarone because of increased LFTs  . COPD (chronic obstructive pulmonary disease)     Chronic SOB, O2 dependent  . Myocardial infarction 1994  . Gastro - esophageal reflux   . Claudication   . BPH (benign prostatic hyperplasia)   . Coronary artery disease     a. Myocardial infarction in 1984,  99 and 07. b. H/o multiple RCA stents. c.  LHC 04/2011: without obstructive disease. oLM 25%, mLAD 30%, pCFX 25%, prox to mid RCA stent ok, dRCA 30% and 70%, pPDA 25%, mPL 25-30%, EF 25%.  Andrew [name] Hypertension   . Atrial fibrillation     a. On Xarelto.  . Hemorrhoids   . Hyperlipidemia     statin intolerant 2/2 myalgias  . Hx of adenomatous colonic polyps   . Hx of colonoscopy   . Chronic renal insufficiency   . Biventricular cardiac pacemaker in situ 3.2013  . NSVT (nonsustained ventricular tachycardia)     a. 04/2012- 6 beats on tele.  Andrew [name] Shortness of breath   . Arthritis     hips ,back ,hands    Past Surgical History  Procedure Laterality Date  . Cardiac catheterization  2008  . Cataract extraction    . Salivary gland surgery    . Coronary angioplasty with stent placement    . Insert / replace / remove pacemaker  06/16/2011  . Tee without cardioversion  05/08/2012    Procedure: TRANSESOPHAGEAL  ECHOCARDIOGRAM (TEE);  Surgeon: Vesta Mixer, MD;  Location: [facility] ENDOSCOPY;  Service: Cardiovascular;  Laterality: N/A;    History  Smoking status  . Former Smoker -- 1.50 packs/day for 55 years  . Quit  date: 01/03/2009  Smokeless tobacco  . Never Used    History  Alcohol Use  . Yes    Comment: occasional    Family History  Problem Relation Age of Onset  . Coronary artery disease Father   . Heart disease Mother   . Colon cancer Neg Hx   . Heart disease Brother     Review of Systems: The review of systems is per the HPI.  All other systems were reviewed and are negative.  Physical Exam: BP 100/58  Pulse 68  Ht 5' 10.5" (1.791 m)  Wt 181 lb 12.8 oz (82.464 kg)  BMI 25.71 kg/m2  SpO2 90% Patient is very pleasant and in no acute distress. He does look chronically ill. Oxygen sats down to 80% with walking here in the office - slow to recover back to the mid 80's. Skin is warm and dry. Color is normal.  HEENT is unremarkable. Normocephalic/atraumatic. PERRL. Sclera are nonicteric. Neck is supple. No masses. No JVD. Lungs show decreased breath sounds. Cardiac exam shows a regular rate and rhythm. Abdomen is soft. Extremities are without edema. Gait and ROM are intact. No gross neurologic deficits noted.  LABORATORY DATA: Lab Results  Component Value Date   WBC 8.9 11/13/2012   HGB 10.3* 11/13/2012   HCT 33.7* 11/13/2012   PLT 254.0 05/29/2012   GLUCOSE 102* 11/07/2012   CHOL 136 03/17/2012   TRIG 113.0 03/17/2012   HDL 30.70* 03/17/2012   LDLDIRECT 155.7 10/14/2010   LDLCALC 83 03/17/2012   ALT 9 05/04/2012   AST 18 05/04/2012   NA 134* 11/07/2012   K 5.1 11/07/2012   CL 101 11/07/2012   CREATININE 1.59* 11/07/2012   BUN 23 11/07/2012   CO2 26 11/07/2012   TSH 4.286 08/09/2011   INR 1.77* 05/03/2012   HGBA1C 6.0* 03/10/2011     Assessment / Plan: 1. Dyspnea - I suspect this is more pulmonary and related to his anemia - His last BNP while elevated, was not that high for him as it  has been in the past - weight is stable. No swelling and he looks compensated from a cardiac standpoint. Now back on iron therapy. I think he needs to be wearing his oxygen more - especially with activities - if he fails to improve after improvement of his blood counts, would consider sending him back to pulmonary. He will see me in a month, Dr. Antoine Poche in October.   2. COPD - past use of amiodarone  3. CAD - managed medically - no chest pain reported.  4. Iron deficiency anemia - back on oral therapy - may need more aggressive therapy with IV iron - I don't think he ever had a GI evaluation but given his multiple comorbidities - I do not think this is in his best interest.   Patient is agreeable to this plan and will call if any problems develop in the interim.   Rosalio Macadamia, RN, ANP-C [facility] [address] [place], [place]  [address]

## 2012-11-24 NOTE — Patient Instructions (Addendum)
Cancel the visit with Dr. Graciela Husbands and change that for 6 months  I will see you in a month  See Dr. Antoine Poche in October  We will check labs today  Wear your oxygen more in the daytime now  Call the [facility] office at [phone] if you have any questions, problems or concerns.

## 2012-11-30 ENCOUNTER — Telehealth: Payer: Self-pay | Admitting: Emergency Medicine

## 2012-11-30 NOTE — Telephone Encounter (Signed)
Called him to advise, left message for him to call and check your schedule. Please clarify, Would this be lab only? Or do you need to see him?

## 2012-11-30 NOTE — Telephone Encounter (Signed)
Please call patient and have him followup with me next week so I can repeat his hemoglobin and be sure it is not declining

## 2012-12-01 NOTE — Telephone Encounter (Signed)
I would like to see the patient. He can come to see me at 102 and I can do his hemoglobin there

## 2012-12-04 NOTE — Telephone Encounter (Signed)
Great, I have advised on recording for him to come here and see you for this. I will call again.

## 2012-12-05 ENCOUNTER — Other Ambulatory Visit: Payer: Self-pay

## 2012-12-05 MED ORDER — FUROSEMIDE 40 MG PO TABS: 40.0000 mg | ORAL_TABLET | Freq: Every day | ORAL | Status: DC

## 2012-12-06 ENCOUNTER — Telehealth: Payer: Self-pay | Admitting: Hematology & Oncology

## 2012-12-06 ENCOUNTER — Ambulatory Visit (INDEPENDENT_AMBULATORY_CARE_PROVIDER_SITE_OTHER): Payer: Medicare Other | Admitting: Emergency Medicine

## 2012-12-06 VITALS — BP 114/64 | HR 70 | Temp 97.5°F | Resp 18 | Ht 69.0 in | Wt 178.8 lb

## 2012-12-06 DIAGNOSIS — R8281 Pyuria: Secondary | ICD-10-CM

## 2012-12-06 DIAGNOSIS — D509 Iron deficiency anemia, unspecified: Secondary | ICD-10-CM

## 2012-12-06 DIAGNOSIS — R718 Other abnormality of red blood cells: Secondary | ICD-10-CM

## 2012-12-06 DIAGNOSIS — D649 Anemia, unspecified: Secondary | ICD-10-CM

## 2012-12-06 LAB — POCT CBC
Granulocyte percent: 79.5 %G (ref 37–80)
Lymph, poc: 1.1 (ref 0.6–3.4)
MCHC: 29.9 g/dL — AB (ref 31.8–35.4)
MID (cbc): 0.5 (ref 0–0.9)
MPV: 7.8 fL (ref 0–99.8)
POC Granulocyte: 6 (ref 2–6.9)
POC LYMPH PERCENT: 14.3 %L (ref 10–50)
POC MID %: 6.2 %M (ref 0–12)
Platelet Count, POC: 279 10*3/uL (ref 142–424)
RDW, POC: 18.2 %

## 2012-12-06 LAB — POCT URINALYSIS DIPSTICK
Blood, UA: NEGATIVE
Glucose, UA: NEGATIVE
Ketones, UA: NEGATIVE
Spec Grav, UA: 1.02
Urobilinogen, UA: 0.2

## 2012-12-06 LAB — POCT UA - MICROSCOPIC ONLY
Crystals, Ur, HPF, POC: NEGATIVE
Mucus, UA: NEGATIVE

## 2012-12-06 LAB — IFOBT (OCCULT BLOOD): IFOBT: NEGATIVE

## 2012-12-06 NOTE — Telephone Encounter (Signed)
He did come in today

## 2012-12-06 NOTE — Telephone Encounter (Signed)
Amy from Dr. Cleta Alberts called [phone] to refer pt wanted [place] to see next week. I took chart to Dr. Myna Hidalgo he wanted to speak to Dr. Cleta Alberts I transferred Dr. Cleta Alberts to Dr. Myna Hidalgo

## 2012-12-06 NOTE — Progress Notes (Signed)
  Subjective:    Patient ID: Andrew [name], male    DOB: [birth year], 79 y.o.   MRN: [id number]  HPI   79 y.o. Male presents to clinic today for recheck of hemaglobin. Was 9.8 on 8/22. Has been feeling fatigued. Currently taken a iron and prenatal pill. Has had black stools, but denies any blood in stools.  Also states that COPD has been bothering him recently. He has low on studies and a low reticulocyte count. His immunofixation and protein electrophoresis did not show any definite signs of myeloma. He has been chronically ill the last couple of years due to congestive heart failure and recurrent respiratory infections  Review of Systems     Objective:   Physical Exam patient is alert and cooperative and in no distress rectal exam wasn't done to obtain a hemmosure  Results for orders placed in visit on 12/06/12  IFOBT (OCCULT BLOOD)      Result Value Range   IFOBT Negative    POCT URINALYSIS DIPSTICK      Result Value Range   Color, UA yellow     Clarity, UA clear     Glucose, UA neg     Bilirubin, UA neg     Ketones, UA neg     Spec Grav, UA 1.020     Blood, UA neg     pH, UA 6.0     Protein, UA 100     Urobilinogen, UA 0.2     Nitrite, UA neg     Leukocytes, UA Negative    POCT UA - MICROSCOPIC ONLY      Result Value Range   WBC, Ur, HPF, POC 8-28     RBC, urine, microscopic 1-3     Bacteria, U Microscopic trace     Mucus, UA negative     Epithelial cells, urine per micros 0-3     Crystals, Ur, HPF, POC neg     Casts, Ur, LPF, POC neg     Yeast, UA neg        Assessment & Plan:  Referral will be made to hematology. We'll check his stool for blood today. I suspect his anemia could be due to myelofibrosis with low retake count it could be due to chronic disease since he has been so ill last 2 years with congestive heart failure and his lung disease. There is also a possibility of a faint restricted [name] present on his protein electrophoresis. I'm also ordered a  erythropoietin level at the suggestion of Dr. Myna [name]  because [name] also has diminished renal

## 2012-12-07 LAB — ERYTHROPOIETIN: Erythropoietin: 23.6 m[IU]/mL — ABNORMAL HIGH (ref 2.6–18.5)

## 2012-12-08 ENCOUNTER — Telehealth: Payer: Self-pay | Admitting: Hematology & Oncology

## 2012-12-08 LAB — URINE CULTURE: Colony Count: NO GROWTH

## 2012-12-08 NOTE — Telephone Encounter (Signed)
Pt aware of 9-12 appointment. Per MD schedule for 30 min

## 2012-12-14 ENCOUNTER — Encounter: Payer: Medicare Other | Admitting: Internal Medicine

## 2012-12-15 ENCOUNTER — Ambulatory Visit (HOSPITAL_BASED_OUTPATIENT_CLINIC_OR_DEPARTMENT_OTHER): Payer: Medicare Other | Admitting: Hematology & Oncology

## 2012-12-15 ENCOUNTER — Ambulatory Visit: Payer: Medicare Other

## 2012-12-15 ENCOUNTER — Ambulatory Visit (HOSPITAL_BASED_OUTPATIENT_CLINIC_OR_DEPARTMENT_OTHER): Payer: Medicare Other | Admitting: Lab

## 2012-12-15 VITALS — BP 132/54 | HR 62 | Temp 94.8°F | Resp 20 | Ht 70.0 in | Wt 180.0 lb

## 2012-12-15 DIAGNOSIS — D649 Anemia, unspecified: Secondary | ICD-10-CM

## 2012-12-15 DIAGNOSIS — D551 Anemia due to other disorders of glutathione metabolism: Secondary | ICD-10-CM

## 2012-12-15 LAB — CBC WITH DIFFERENTIAL (CANCER CENTER ONLY)
BASO#: 0 10*3/uL (ref 0.0–0.2)
BASO%: 0.2 % (ref 0.0–2.0)
EOS%: 2.6 % (ref 0.0–7.0)
HGB: 10.5 g/dL — ABNORMAL LOW (ref 13.0–17.1)
LYMPH#: 0.8 10*3/uL — ABNORMAL LOW (ref 0.9–3.3)
MCH: 27.9 pg — ABNORMAL LOW (ref 28.0–33.4)
MCHC: 31.2 g/dL — ABNORMAL LOW (ref 32.0–35.9)
MONO%: 10.1 % (ref 0.0–13.0)
NEUT#: 6.8 10*3/uL — ABNORMAL HIGH (ref 1.5–6.5)
RDW: 16.6 % — ABNORMAL HIGH (ref 11.1–15.7)

## 2012-12-15 LAB — IRON AND TIBC CHCC
%SAT: 14 % — ABNORMAL LOW (ref 20–55)
Iron: 32 ug/dL — ABNORMAL LOW (ref 42–163)
UIBC: 188 ug/dL (ref 117–376)

## 2012-12-15 LAB — FERRITIN CHCC: Ferritin: 37 ng/ml (ref 22–316)

## 2012-12-16 NOTE — Progress Notes (Signed)
This office note has been dictated.

## 2012-12-16 NOTE — Progress Notes (Signed)
CC:   Andrew [name]. Andrew [name], M.D.  DIAGNOSES: 1. Normochromic normocytic anemia. 2. Erythropoietin deficiency.  HISTORY OF PRESENT ILLNESS:  Andrew [name] is a very nice 79 year old white gentleman.  He is followed by Andrew [name].  He has a few medical problems.  He has COPD.  He has hypertension.  He has a history of atrial fibrillation.  He is on Xarelto.  He has been found to have anemia.  Going back to February of this year, a CBC was done.  This showed a white cell count of 6.9, hemoglobin 9.5, hematocrit 28.1, platelet count 233.  MCV was 89.  In August, his hemoglobin was 9.4.  MCV was 91.  On September 3rd, a CBC was done which showed a white cell count of 7.6, hemoglobin 8.6, hematocrit 28.8, platelet count 325.  Again, MCV was 88.  He had an erythropoietin level done on September 3rd.  This was only 24.  A metabolic panel done back in August showed a BUN of 26, creatinine 1.6.  He had an elevated pro beta-natriuretic peptide.  A monoclonal study was done.  This did not show a monoclonal spike in his serum.  Iron studies were done back in early August.  He was iron deficient. His ferritin was only 51, with iron saturation of 9%.  He has been on some oral iron.  His stools have been black.  I think he has been endoscoped.  So far, there has been no obvious source for any kind of GI bleeding.  He has not lost weight.  He has had no cough.  He is chronically short of breath because of the COPD.  He has had no rashes.  There has been some occasional leg swelling.  He has had occasional headache.  Overall, his performance status is ECOG 2.  He has had a decent appetite.  He is not a vegetarian.  He has had no nausea or vomiting.  PAST MEDICAL HISTORY: 1. COPD. 2. Congestive heart failure. 3. Atrial fibrillation. 4. Hypertension. 5. Osteoarthritis. 6. GERD.  ALLERGIES: 1. Codeine. 2. Inspra. 3. Aldactone. 4. Doxycycline. 5. Sulfa medications.  MEDICATIONS: 1.  Albuterol nebulizer 2.5 mg q.6 hours. 2. Xanax 0.25 mg q. day p.r.n. 3. ProAir inhaler 1-2 puffs q.6 hours p.r.n. 4. Coreg 25 mg p.o. b.i.d. 5. Nexium 40 mg p.o. b.i.d. 6. Iron pills 1 p.o. q. day. 7. Flonase nasal spray 2 sprays daily. 8. Advair Diskus (250/50) 1 puff b.i.d. 9. Lasix 40 mg p.o. q. day, alternating with 60 mg p.o. q. day. 10.Hydralazine 25 mg as needed. 11.Vicodin (5/325) 1 p.o. q.6 hours p.r.n. 12.Cozaar 50 mg p.o. q. day. 13.Xarelto 20 mg p.o. q. day. 14.Flomax 0.4 mg p.o. b.i.d.  SOCIAL HISTORY:  Remarkable for past tobacco use.  There is no history of significant alcohol use.  He has no obvious occupational exposures.  FAMILY HISTORY:  Noncontributory.  There is no obvious anemia in the family.  REVIEW OF SYSTEMS:  As stated in the History of Present Illness.  No additional findings are noted on a 12-system review.  PHYSICAL EXAMINATION:  General:  This is an elderly white gentleman in no obvious distress.  Vital signs:  Temperature of 98.4, pulse 62, respiratory rate 20, blood pressure 132/54.  Weight is 180 pounds.  [name] and neck:  Normocephalic, atraumatic skull.  There are no ocular or oral lesions.  There are no palpable cervical or supraclavicular lymph nodes. Lungs:  Clear bilaterally.  Cardiac:  Regular rate and  rhythm, with an occasional extra beat.  He has a 1/6 systolic ejection murmur.  Abdomen: Soft.  He has decent bowel sounds.  There is no fluid wave.  There is no palpable hepatosplenomegaly.  Back:  No tenderness over the spine, ribs, or hips.  Extremities:  Show some 1+ edema in his legs.  He has some slight stasis dermatitis changes in the legs.  He has some osteoarthritic changes in his joints.  Muscle strength is symmetric bilaterally.  Neurological:  No focal neurological deficits.  Skin:  No rashes, ecchymoses or petechiae.  LABORATORY STUDIES:  White cell count 8.7, hemoglobin 10.5, hematocrit 33.7, platelet count 276.  Vitamin B12  is 8.9.  Ferritin is 37, with an iron saturation of only 14%.  LDH is 161.  Corrected reticulocyte count is 0.9.  BUN and creatinine are 23 and 1.42.  Total protein is 6.7, with an albumin of 3.8.  Peripheral smear shows a slightly microcytic population of red blood cells.  He has no nucleated red blood cells.  There are some hypochromic red cells.  I see no schistocyte.  There are no spherocytes.  He has no rouleaux formation.  Target cells not noted.  White cells appear normal in morphology and maturation.  There are no hypersegmented polys.  I see no immature myeloid or lymphoid forms.  I see no blasts.  Platelets are adequate in number and size.  IMPRESSION:  Andrew [name] is an 79 year old gentleman.  He has a normochromic normocytic anemia.  I believe that his anemia is multifactorial.  With his blood smear, I do not see any obvious myelodysplastic changes.  However, at age 79, this is always a possibility.  I do think, however, that this is a low possibility.  I really believe that this is a combination of erythropoietin deficiency and iron deficiency.  His hemoglobin is better today that it was when he had it checked about 3 weeks ago.  I really believe that he is probably going to need Aranesp or Procrit to try to get his bone marrow stimulated.  His erythropoietin level of only 23 is low given his degree of anemia.  I do not see any evidence of myeloma.  I do not see any evidence of lymphoma or leukemia when I looked at his blood smear.  He is on quite a few medications.  I suppose some of his medications could certainly be a problem.  Again, I think that his hemoglobin is improving.  However, his iron studies still show a low iron.  I think he is probably going to need IV iron.  He is on Nexium, so this would really limit how much iron he can absorb.  I think we should try the IV iron first.  If the IV iron does not get his hemoglobin above 11, then I think we  can try one of the ESA products.  I spent a good 90 minutes with him and his wife.  They are both very, very nice people.  I reviewed his lab work with him.  I gave him my recommendations.  We will call him about the iron levels.  We will try to get him in in a week or so for IV iron.  I will then see him back probably in about 4 weeks or so.  If we do not see an improvement in his hemoglobin at that point, then we are going to probably need to institute a ESA product.   ______________________________ Andrew [name]  Andrew [name], M.D. PRE/MEDQ  D:  12/16/2012  T:  12/16/2012  Job:  [id number]

## 2012-12-18 ENCOUNTER — Telehealth: Payer: Self-pay | Admitting: Hematology & Oncology

## 2012-12-18 NOTE — Telephone Encounter (Signed)
Pt aware of 9-17 iron infusion

## 2012-12-19 LAB — PROTEIN ELECTROPHORESIS, SERUM, WITH REFLEX: Total Protein, Serum Electrophoresis: 6.7 g/dL (ref 6.0–8.3)

## 2012-12-19 LAB — COMPREHENSIVE METABOLIC PANEL
ALT: 9 U/L (ref 0–53)
AST: 16 U/L (ref 0–37)
BUN: 23 mg/dL (ref 6–23)
Creatinine, Ser: 1.42 mg/dL — ABNORMAL HIGH (ref 0.50–1.35)
Total Bilirubin: 0.4 mg/dL (ref 0.3–1.2)

## 2012-12-19 LAB — HAPTOGLOBIN: Haptoglobin: 233 mg/dL — ABNORMAL HIGH (ref 45–215)

## 2012-12-19 LAB — IFE INTERPRETATION

## 2012-12-19 LAB — RETICULOCYTES (CHCC)
RBC.: 3.85 MIL/uL — ABNORMAL LOW (ref 4.22–5.81)
Retic Ct Pct: 1.4 % (ref 0.4–2.3)

## 2012-12-19 LAB — LACTATE DEHYDROGENASE: LDH: 161 U/L (ref 94–250)

## 2012-12-20 ENCOUNTER — Ambulatory Visit (HOSPITAL_BASED_OUTPATIENT_CLINIC_OR_DEPARTMENT_OTHER): Payer: Medicare Other

## 2012-12-20 DIAGNOSIS — D649 Anemia, unspecified: Secondary | ICD-10-CM

## 2012-12-20 DIAGNOSIS — D509 Iron deficiency anemia, unspecified: Secondary | ICD-10-CM

## 2012-12-20 MED ORDER — SODIUM CHLORIDE 0.9 % IV SOLN
Freq: Once | INTRAVENOUS | Status: AC
  Administered 2012-12-20: 12:00:00 via INTRAVENOUS

## 2012-12-20 MED ORDER — SODIUM CHLORIDE 0.9 % IV SOLN
1020.0000 mg | Freq: Once | INTRAVENOUS | Status: AC
  Administered 2012-12-20: 1020 mg via INTRAVENOUS
  Filled 2012-12-20: qty 34

## 2012-12-20 NOTE — Patient Instructions (Signed)
Ferumoxytol injection What is this medicine? FERUMOXYTOL is an iron complex. Iron is used to make healthy red blood cells, which carry oxygen and nutrients throughout the body. This medicine is used to treat iron deficiency anemia in people with chronic kidney disease. This medicine may be used for other purposes; ask your health care provider or pharmacist if you have questions. What should I tell my health care provider before I take this medicine? They need to know if you have any of these conditions: -anemia not caused by low iron levels -high levels of iron in the blood -magnetic resonance imaging (MRI) test scheduled -an unusual or allergic reaction to iron, other medicines, foods, dyes, or preservatives -pregnant or trying to get pregnant -breast-feeding How should I use this medicine? This medicine is for infusion into a vein. It is given by a health care professional in a hospital or clinic setting. Talk to your pediatrician regarding the use of this medicine in children. Special care may be needed. Overdosage: If you think you've taken too much of this medicine contact a poison control center or emergency room at once. Overdosage: If you think you have taken too much of this medicine contact a poison control center or emergency room at once. NOTE: This medicine is only for you. Do not share this medicine with others. What if I miss a dose? It is important not to miss your dose. Call your doctor or health care professional if you are unable to keep an appointment. What may interact with this medicine? This medicine may interact with the following medications: -other iron products This list may not describe all possible interactions. Give your health care provider a list of all the medicines, herbs, non-prescription drugs, or dietary supplements you use. Also tell them if you smoke, drink alcohol, or use illegal drugs. Some items may interact with your medicine. What should I watch  for while using this medicine? Visit your doctor or healthcare professional regularly. Tell your doctor or healthcare professional if your symptoms do not start to get better or if they get worse. You may need blood work done while you are taking this medicine. You may need to follow a special diet. Talk to your doctor. Foods that contain iron include: whole grains/cereals, dried fruits, beans, or peas, leafy green vegetables, and organ meats (liver, kidney). What side effects may I notice from receiving this medicine? Side effects that you should report to your doctor or health care professional as soon as possible: -allergic reactions like skin rash, itching or hives, swelling of the face, lips, or tongue -breathing problems -changes in blood pressure -feeling faint or lightheaded, falls -fever or chills -flushing, sweating, or hot feelings -swelling of the ankles or feet Side effects that usually do not require medical attention (Report these to your doctor or health care professional if they continue or are bothersome.): -diarrhea -headache -nausea, vomiting -stomach pain This list may not describe all possible side effects. Call your doctor for medical advice about side effects. You may report side effects to FDA at 1-800-FDA-1088. Where should I keep my medicine? This drug is given in a hospital or clinic and will not be stored at home. NOTE: This sheet is a summary. It may not cover all possible information. If you have questions about this medicine, talk to your doctor, pharmacist, or health care provider.  2013, [employer]. (12/13/2007 9:48:25 PM)  

## 2012-12-22 ENCOUNTER — Telehealth: Payer: Self-pay

## 2012-12-22 NOTE — Telephone Encounter (Signed)
Pt is wanting to talk with dr Cleta Alberts about paper work from [employer] .  [facility] has left a message on patient voicemail that dr Cleta Alberts has refused to sign a form and he doesn't understand  What is going on [phone]

## 2012-12-23 NOTE — Telephone Encounter (Signed)
Scan in EPIC shows that Dr Cleta Alberts signed order for nebulizer and neb meds in July for a year. Called pt and he reported that Andrew [name] at [facility] wanted pt to try a type of equipment called Trilogy having to do w/O2 at night. I advised pt that we will try to contact [facility] next week and find out what is being requested and get the form for Dr Cleta Alberts to review. I don't see any documentation of this form being addressed already.

## 2012-12-25 ENCOUNTER — Telehealth: Payer: Self-pay

## 2012-12-25 ENCOUNTER — Ambulatory Visit (INDEPENDENT_AMBULATORY_CARE_PROVIDER_SITE_OTHER): Payer: Medicare Other | Admitting: Nurse Practitioner

## 2012-12-25 ENCOUNTER — Encounter: Payer: Self-pay | Admitting: Nurse Practitioner

## 2012-12-25 VITALS — BP 118/64 | HR 68 | Ht 69.0 in | Wt 183.8 lb

## 2012-12-25 DIAGNOSIS — I5022 Chronic systolic (congestive) heart failure: Secondary | ICD-10-CM

## 2012-12-25 NOTE — Telephone Encounter (Signed)
Have you gotten any correspondence from [facility]? Do you know what he needs?

## 2012-12-25 NOTE — Telephone Encounter (Signed)
Call patient let him know I do not have the form. Have him contact [facility] to refax the  form to me

## 2012-12-25 NOTE — Telephone Encounter (Signed)
PT WOULD LIKE TO SPEAK WITH DR DAUB ABOUT THE REASON HE ISN'T SIGNING OFF ON A FORM THAT [facility] HAVE BEEN FAXING TO HIM PLEASE CALL [phone]

## 2012-12-25 NOTE — Progress Notes (Signed)
Andrew [name] Date of Birth: Mar 17, 1931 Medical Record #[id number]  History of Present Illness: Andrew [name] is seen back today for a one month check. Seen for Dr. Zannie [name]. He has a very complex medical history which includes chronic systolic heart failure. Now with BiV device in place. On Xarelto for his atrial flutter and off of amiodarone due to increased LFT's in the remote past. Has COPD. On oxygen only at night. Has known CAD with remote MIs and multiple stents. EF is 15% per echo back in 2012. Other issues include HTN, HLD, CRI. Was admitted back earlier in 2014 with sepsis - had ampicillin sensitive enterococcal bacteremia. His TEE was negative for a vegetation on his device.   I had not seen him since February of 2014. Has seen Dr. Johney [name] in early August 2014 as a work in visit for dyspnea - had been to UC and was seen there for SOB - felt to be more CHF but interrogation of his device really did not show this and his BNP although elevated was actually improved. Continues to have progressive dyspnea. Only using his oxygen at night. Has gotten anemic - looks to be iron deficient. Now back on iron therapy.   Seen last month by me. Was hypoxic. Had just received an iron infusion.   Comes back today. Here alone today. Feeling so much better since he has had some IV iron. Breathing has improved. No swelling. Weight is doing well at home. Tolerating his medicines. Not dizzy or lightheaded. No chest pain. No shocks reported.     Current Outpatient Prescriptions  Medication Sig Dispense Refill  . albuterol (2.5 MG/3ML) 0.083% NEBU 3 mL, albuterol (5 MG/ML) 0.5% NEBU 0.5 mL Inhale 2.5 mg into the lungs 4 (four) times daily. For shortness of breath.      [name] albuterol (PROVENTIL) (2.5 MG/3ML) 0.083% nebulizer solution Take 3 mLs (2.5 mg total) by nebulization every 6 (six) hours as needed for wheezing.  75 mL  12  . ALPRAZolam (XANAX) 0.25 MG tablet Take 0.25 mg by mouth daily as needed. For anxiety.       . carvedilol (COREG) 25 MG tablet TAKE 1 TABLET (25 MG TOTAL) BY MOUTH 2 (TWO) TIMES DAILY WITH A MEAL.  120 tablet  1  . esomeprazole (NEXIUM) 40 MG capsule Take 1 capsule (40 mg total) by mouth 2 (two) times daily. PATIENT NEEDS OFFICE VISIT FOR ADDITIONAL REFILLS  180 capsule  3  . fluticasone (FLONASE) 50 MCG/ACT nasal spray Place 2 sprays into the nose daily.  16 g  2  . Fluticasone-Salmeterol (ADVAIR DISKUS) 250-50 MCG/DOSE AEPB Inhale 1 puff into the lungs 2 (two) times daily.  3 each  3  . furosemide (LASIX) 40 MG tablet Take 1-1.5 tablets (40-60 mg total) by mouth daily. every other day 40 mg and every other day 60 mg  135 tablet  6  . hydrALAZINE (APRESOLINE) 25 MG tablet 25 mg as needed. Prn      . HYDROcodone-acetaminophen (NORCO/VICODIN) 5-325 MG per tablet Take 1 tablet by mouth every 6 (six) hours as needed for pain.      [name] ipratropium (ATROVENT) 0.02 % nebulizer solution Take 2.5 mLs (500 mcg total) by nebulization 4 (four) times daily.  75 mL  12  . losartan (COZAAR) 50 MG tablet Take 50 mg by mouth 2 (two) times daily.      [name] NITROSTAT 0.4 MG SL tablet PLACE 1 TABLET (0.4 MG TOTAL) UNDER THE TONGUE EVERY 5 (  FIVE) MINUTES AS NEEDED FOR CHEST PAIN.  25 tablet  1  . potassium chloride (KLOR-CON M10) 10 MEQ tablet Take 10 mEq by mouth daily. Every other day      . PROAIR HFA 108 (90 BASE) MCG/ACT inhaler INHALE 1 TO 2 PUFFS EVERY 6 HOURS AS NEEDED  1 each  3  . Tamsulosin HCl (FLOMAX) 0.4 MG CAPS Take 0.4 mg by mouth 2 (two) times daily.       Andrew [name] 20 MG TABS TAKE 1 TABLET DAILY  90 tablet  3  . [DISCONTINUED] metoprolol tartrate (LOPRESSOR) 25 MG tablet Take 1/2 tablet every other day   45 tablet  3   No current facility-administered medications for this visit.    Allergies  Allergen Reactions  . Codeine Nausea And Vomiting  . Inspra [Eplerenone] Other (See Comments)    Stomach problems  . Spironolactone Other (See Comments)    Gynecomastia  . Doxycycline Itching  and Rash  . Sulfonamide Derivatives Itching and Rash    Past Medical History  Diagnosis Date  . Chronic systolic heart failure     a. Chronic LV systolic failure. b. Echo in Dec 2011 showed EF of 25% - f/u echo 02/2011: Mild LVH, EF 15%, posterior lateral akinesis, inferior akinesis, grade 1 diastolic dysfunction, mild LAE. c. s/p BiV pacemaker 06/2011.  [name] Atrial flutter     a. Off amiodarone because of increased LFTs  . COPD (chronic obstructive pulmonary disease)     Chronic SOB, O2 dependent  . Myocardial infarction 1994  . Gastro - esophageal reflux   . Claudication   . BPH (benign prostatic hyperplasia)   . Coronary artery disease     a. Myocardial infarction in 1984,  99 and 07. b. H/o multiple RCA stents. c.  LHC 04/2011: without obstructive disease. oLM 25%, mLAD 30%, pCFX 25%, prox to mid RCA stent ok, dRCA 30% and 70%, pPDA 25%, mPL 25-30%, EF 25%.  [name] Hypertension   . Atrial fibrillation     a. On Xarelto.  . Hemorrhoids   . Hyperlipidemia     statin intolerant 2/2 myalgias  . Hx of adenomatous colonic polyps   . Hx of colonoscopy   . Chronic renal insufficiency   . Biventricular cardiac pacemaker in situ 3.2013  . NSVT (nonsustained ventricular tachycardia)     a. 04/2012- 6 beats on tele.  [name] Shortness of breath   . Arthritis     hips ,back ,hands    Past Surgical History  Procedure Laterality Date  . Cardiac catheterization  2008  . Cataract extraction    . Salivary gland surgery    . Coronary angioplasty with stent placement    . Insert / replace / remove pacemaker  06/16/2011  . Tee without cardioversion  05/08/2012    Procedure: TRANSESOPHAGEAL ECHOCARDIOGRAM (TEE);  Surgeon: Vesta Mixer, MD;  Location: [facility] ENDOSCOPY;  Service: Cardiovascular;  Laterality: N/A;    History  Smoking status  . Former Smoker -- 1.50 packs/day for 55 years  . Quit date: 01/03/2009  Smokeless tobacco  . Never Used    History  Alcohol Use  . Yes    Comment: occasional     Family History  Problem Relation Age of Onset  . Coronary artery disease Father   . Heart disease Mother   . Colon cancer Neg Hx   . Heart disease Brother     Review of Systems: The review of systems is per the HPI.  All other systems were reviewed and are negative.  Physical Exam: BP 118/64  Pulse 68  Ht 5\' 9"  (1.753 m)  Wt 183 lb 12.8 oz (83.371 kg)  BMI 27.13 kg/m2 Patient is very pleasant and in no acute distress. He looks like he is feeling better today. Skin is warm and dry. Color is normal.  HEENT is unremarkable. Normocephalic/atraumatic. PERRL. Sclera are nonicteric. Neck is supple. No masses. No JVD. Lungs are clear with decreased breath sounds. Cardiac exam shows a regular rate and rhythm. Abdomen is soft. Extremities are without edema. Gait and ROM are intact. No gross neurologic deficits noted.  LABORATORY DATA:  Lab Results  Component Value Date   WBC 8.7 12/15/2012   HGB 10.5* 12/15/2012   HCT 33.7* 12/15/2012   PLT 276 12/15/2012   GLUCOSE 86 12/15/2012   CHOL 136 03/17/2012   TRIG 113.0 03/17/2012   HDL 30.70* 03/17/2012   LDLDIRECT 155.7 10/14/2010   LDLCALC 83 03/17/2012   ALT 9 12/15/2012   AST 16 12/15/2012   NA 136 12/15/2012   K 4.4 12/15/2012   CL 102 12/15/2012   CREATININE 1.42* 12/15/2012   BUN 23 12/15/2012   CO2 23 12/15/2012   TSH 4.286 08/09/2011   INR 1.77* 05/03/2012   HGBA1C 6.0* 03/10/2011     Assessment / Plan: 1. Systolic HF - looks compensated - feeling better clinically - no change in his current regimen.   2. COPD - past use of amiodarone  3. CAD - managed medically - no symptoms reported.   4. Iron deficiency anemia - much better with IV iron infusion.   He looks better. I have left him on his current regimen - he is seeing Dr. Antoine Poche back in October. I will be happy to follow as well.  Patient is agreeable to this plan and will call if any problems develop in the interim.   Rosalio Macadamia, RN, ANP-C [facility] [address] [place], [place]  [address]

## 2012-12-25 NOTE — Patient Instructions (Signed)
I think you are doing well  Stay on your current medicines  See Dr. Antoine Poche next month  Call the [facility] office at [phone] if you have any questions, problems or concerns.

## 2012-12-26 NOTE — Telephone Encounter (Signed)
Thank you . I have called patient to advise. He will have [employer] send the form to me.

## 2013-01-02 ENCOUNTER — Other Ambulatory Visit: Payer: Self-pay | Admitting: Cardiology

## 2013-01-08 ENCOUNTER — Ambulatory Visit (INDEPENDENT_AMBULATORY_CARE_PROVIDER_SITE_OTHER): Payer: Medicare Other | Admitting: *Deleted

## 2013-01-08 DIAGNOSIS — Z23 Encounter for immunization: Secondary | ICD-10-CM

## 2013-01-12 ENCOUNTER — Other Ambulatory Visit: Payer: Self-pay | Admitting: Cardiology

## 2013-01-16 ENCOUNTER — Ambulatory Visit (INDEPENDENT_AMBULATORY_CARE_PROVIDER_SITE_OTHER): Payer: Medicare Other | Admitting: Cardiology

## 2013-01-16 VITALS — BP 136/76 | HR 68 | Ht 71.0 in | Wt 184.0 lb

## 2013-01-16 DIAGNOSIS — R0989 Other specified symptoms and signs involving the circulatory and respiratory systems: Secondary | ICD-10-CM

## 2013-01-16 NOTE — Progress Notes (Signed)
HPI The patient presents for followup of his cardiomyopathy. Since I last saw him he has seen Mrs. Tyrone Sage.   He has been receiving iron infusions and doing well. He feels much better than he was a while ago. He denies any acute shortness of breath, PND or orthopnea. He's had no palpitations, presyncope or syncope. His weights have been stable. Labs have been followed and creatinine and potassium have been stable.  Allergies  Allergen Reactions  . Codeine Nausea And Vomiting  . Inspra [Eplerenone] Other (See Comments)    Stomach problems  . Spironolactone Other (See Comments)    Gynecomastia  . Doxycycline Itching and Rash  . Sulfonamide Derivatives Itching and Rash    Current Outpatient Prescriptions  Medication Sig Dispense Refill  . albuterol (2.5 MG/3ML) 0.083% NEBU 3 mL, albuterol (5 MG/ML) 0.5% NEBU 0.5 mL Inhale 2.5 mg into the lungs 4 (four) times daily. For shortness of breath.      [name] albuterol (PROVENTIL) (2.5 MG/3ML) 0.083% nebulizer solution Take 3 mLs (2.5 mg total) by nebulization every 6 (six) hours as needed for wheezing.  75 mL  12  . ALPRAZolam (XANAX) 0.25 MG tablet Take 0.25 mg by mouth daily as needed. For anxiety.      . carvedilol (COREG) 25 MG tablet TAKE 1 TABLET TWICE A DAY WITH A MEAL  180 tablet  3  . esomeprazole (NEXIUM) 40 MG capsule Take 1 capsule (40 mg total) by mouth 2 (two) times daily. PATIENT NEEDS OFFICE VISIT FOR ADDITIONAL REFILLS  180 capsule  3  . fluticasone (FLONASE) 50 MCG/ACT nasal spray Place 2 sprays into the nose daily.  16 g  2  . Fluticasone-Salmeterol (ADVAIR DISKUS) 250-50 MCG/DOSE AEPB Inhale 1 puff into the lungs 2 (two) times daily.  3 each  3  . furosemide (LASIX) 40 MG tablet Take 1-1.5 tablets (40-60 mg total) by mouth daily. every other day 40 mg and every other day 60 mg  135 tablet  6  . hydrALAZINE (APRESOLINE) 25 MG tablet 25 mg as needed. Prn      . HYDROcodone-acetaminophen (NORCO/VICODIN) 5-325 MG per tablet Take 1  tablet by mouth every 6 (six) hours as needed for pain.      [name] ipratropium (ATROVENT) 0.02 % nebulizer solution Take 2.5 mLs (500 mcg total) by nebulization 4 (four) times daily.  75 mL  12  . losartan (COZAAR) 50 MG tablet TAKE 1 TABLET TWICE A DAY  180 tablet  2  . NITROSTAT 0.4 MG SL tablet PLACE 1 TABLET (0.4 MG TOTAL) UNDER THE TONGUE EVERY 5 (FIVE) MINUTES AS NEEDED FOR CHEST PAIN.  25 tablet  1  . potassium chloride (KLOR-CON M10) 10 MEQ tablet Take 10 mEq by mouth daily. Every other day      . PROAIR HFA 108 (90 BASE) MCG/ACT inhaler INHALE 1 TO 2 PUFFS EVERY 6 HOURS AS NEEDED  1 each  3  . Tamsulosin HCl (FLOMAX) 0.4 MG CAPS Take 0.4 mg by mouth 2 (two) times daily.       [name] 20 MG TABS TAKE 1 TABLET DAILY  90 tablet  3  . [DISCONTINUED] metoprolol tartrate (LOPRESSOR) 25 MG tablet Take 1/2 tablet every other day   45 tablet  3   No current facility-administered medications for this visit.    Past Medical History  Diagnosis Date  . Chronic systolic heart failure     a. Chronic LV systolic failure. b. Echo in Dec 2011  showed EF of 25% - f/u echo 02/2011: Mild LVH, EF 15%, posterior lateral akinesis, inferior akinesis, grade 1 diastolic dysfunction, mild LAE. c. s/p BiV pacemaker 06/2011.  [name] Atrial flutter     a. Off amiodarone because of increased LFTs  . COPD (chronic obstructive pulmonary disease)     Chronic SOB, O2 dependent  . Myocardial infarction 1994  . Gastro - esophageal reflux   . Claudication   . BPH (benign prostatic hyperplasia)   . Coronary artery disease     a. Myocardial infarction in 1984,  99 and 07. b. H/o multiple RCA stents. c.  LHC 04/2011: without obstructive disease. oLM 25%, mLAD 30%, pCFX 25%, prox to mid RCA stent ok, dRCA 30% and 70%, pPDA 25%, mPL 25-30%, EF 25%.  [name] Hypertension   . Atrial fibrillation     a. On Xarelto.  . Hemorrhoids   . Hyperlipidemia     statin intolerant 2/2 myalgias  . Hx of adenomatous colonic polyps   . Hx of  colonoscopy   . Chronic renal insufficiency   . Biventricular cardiac pacemaker in situ 3.2013  . NSVT (nonsustained ventricular tachycardia)     a. 04/2012- 6 beats on tele.  [name] Shortness of breath   . Arthritis     hips ,back ,hands    Past Surgical History  Procedure Laterality Date  . Cardiac catheterization  2008  . Cataract extraction    . Salivary gland surgery    . Coronary angioplasty with stent placement    . Insert / replace / remove pacemaker  06/16/2011  . Tee without cardioversion  05/08/2012    Procedure: TRANSESOPHAGEAL ECHOCARDIOGRAM (TEE);  Surgeon: Vesta Mixer, MD;  Location: [facility] ENDOSCOPY;  Service: Cardiovascular;  Laterality: N/A;    ROS:  As stated in the HPI and negative for all other systems.  PHYSICAL EXAM BP 136/76  Pulse 68  Ht 5\' 11"  (1.803 m)  Wt 184 lb (83.462 kg)  BMI 25.67 kg/m2 GENERAL: Frail appearing  HEENT: Pupils equal round and reactive, fundi not visualized, oral mucosa unremarkable, dentures  NECK:  Jugular venous distention 10 cm, waveform within normal limits, carotid upstroke brisk and symmetric, left carotid bruit, no thyromegaly  LYMPHATICS: No cervical, inguinal adenopathy  LUNGS: Clear to auscultation bilaterally  BACK: No CVA tenderness  CHEST: Well healed ICD scar HEART: PMI not displaced or sustained,S1 and S2 within normal limits, no S3, no S4, no clicks, no rubs, no murmurs  ABD: Flat, positive bowel sounds normal in frequency in pitch, no bruits, no rebound, no guarding, no midline pulsatile mass, no hepatomegaly, no splenomegaly  EXT: 2 plus pulses throughout, mild ankle edema, no cyanosis no clubbing, chronic venous stasis changes.  SKIN: Bruising   ASSESSMENT AND PLAN  Cardiomyopathy -  He seems to be euvolemic.  At this point, no change in therapy is indicated.  We have reviewed salt and fluid restrictions.  No further cardiovascular testing is indicated.  Hypertension -  The blood pressure is at target. No change  in medications is indicated. We will continue with therapeutic lifestyle changes (TLC).  Atrial fibrillation -  He will continue the meds as listed. He's had no symptomatic paroxysms.  Carotid bruit  - He has a very soft left-sided bruit I didn't appreciate this before. I will order carotid Doppler

## 2013-01-16 NOTE — Patient Instructions (Signed)
The current medical regimen is effective;  continue present plan and medications.  Your physician has requested that you have a carotid duplex. This test is an ultrasound of the carotid arteries in your neck. It looks at blood flow through these arteries that supply the brain with blood. Allow one hour for this exam. There are no restrictions or special instructions.  Follow up with Norma Fredrickson, NP in 3 months.

## 2013-01-17 ENCOUNTER — Other Ambulatory Visit (HOSPITAL_BASED_OUTPATIENT_CLINIC_OR_DEPARTMENT_OTHER): Payer: Medicare Other | Admitting: Lab

## 2013-01-17 ENCOUNTER — Ambulatory Visit (HOSPITAL_BASED_OUTPATIENT_CLINIC_OR_DEPARTMENT_OTHER): Payer: Medicare Other | Admitting: Hematology & Oncology

## 2013-01-17 VITALS — BP 145/58 | HR 61 | Temp 97.8°F | Resp 18 | Ht 71.0 in | Wt 183.0 lb

## 2013-01-17 DIAGNOSIS — I10 Essential (primary) hypertension: Secondary | ICD-10-CM

## 2013-01-17 DIAGNOSIS — D509 Iron deficiency anemia, unspecified: Secondary | ICD-10-CM

## 2013-01-17 DIAGNOSIS — D649 Anemia, unspecified: Secondary | ICD-10-CM

## 2013-01-17 DIAGNOSIS — I4891 Unspecified atrial fibrillation: Secondary | ICD-10-CM

## 2013-01-17 LAB — CBC WITH DIFFERENTIAL (CANCER CENTER ONLY)
BASO%: 0.4 % (ref 0.0–2.0)
EOS%: 2.4 % (ref 0.0–7.0)
HCT: 38.4 % — ABNORMAL LOW (ref 38.7–49.9)
LYMPH%: 10.7 % — ABNORMAL LOW (ref 14.0–48.0)
MCHC: 31.5 g/dL — ABNORMAL LOW (ref 32.0–35.9)
MCV: 93 fL (ref 82–98)
NEUT%: 76.4 % (ref 40.0–80.0)
RDW: 17.8 % — ABNORMAL HIGH (ref 11.1–15.7)

## 2013-01-17 LAB — BASIC METABOLIC PANEL - CANCER CENTER ONLY
BUN, Bld: 18 mg/dL (ref 7–22)
CO2: 31 mEq/L (ref 18–33)
Calcium: 8.9 mg/dL (ref 8.0–10.3)
Creat: 1.6 mg/dl — ABNORMAL HIGH (ref 0.6–1.2)
Potassium: 4.5 mEq/L (ref 3.3–4.7)
Sodium: 136 mEq/L (ref 128–145)

## 2013-01-17 LAB — FERRITIN CHCC: Ferritin: 201 ng/ml (ref 22–316)

## 2013-01-17 LAB — IRON AND TIBC CHCC
TIBC: 183 ug/dL — ABNORMAL LOW (ref 202–409)
UIBC: 137 ug/dL (ref 117–376)

## 2013-01-17 NOTE — Progress Notes (Signed)
CC:   Andrew Andrew [name]. Andrew Andrew [name], M.D.  DIAGNOSES: 1. Iron-deficiency anemia. 2. 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  CURRENT THERAPY:  Patient is status post IV iron on 12/20/2012.  INTERIM HISTORY:  Andrew Andrew [name] comes in for a followup.  This is his second office visit.  I saw him back in early September.  When I first saw him, his ferritin was 37 with an iron saturation of 14%.  I felt that he was iron deficient.  His erythropoietin level was 29.  We went ahead and gave him IV iron.  We gave him a dose of Feraheme at 1020 mg.  He did well with this.  He is feeling better.  He has a little bit more energy.  When we saw him, we also did other studies.  There was no monoclonal spike in his serum.  His vitamin B12 level was 850.  His haptoglobin was 233.  Again, he is feeling a little bit better.  He does have other health issues that he has to watch out with.  He has had no bleeding or bruising.  He has had no fevers, sweats, or chills.  There has been no leg swelling.  He continues on Xarelto.  He has atrial fibrillation.  There has been no bleeding on the Xarelto.  PHYSICAL EXAMINATION:  General:  This is an elderly white gentleman in no obvious distress.  Vital signs:  Temperature of 97.8, pulse 61, respiratory rate 18, blood pressure 145/58.  Weight is 183 pounds.  Andrew [name] and neck:  Normocephalic, atraumatic skull.  There are no ocular or oral lesions.  There are no palpable cervical or supraclavicular lymph nodes. Lungs:  Clear bilaterally.  He has a slight decrease at the bases.  No wheezes are noted.  Cardiac:  Irregular rate and rhythm consistent with atrial fibrillation.  He has a 1/6 systolic ejection murmur.  Abdomen: Soft.  He has good bowel sounds.  There is no fluid wave.  There is no palpable abdominal mass.  There is no palpable hepatosplenomegaly. Extremities:  Some slight chronic nonpitting edema in the lower legs. He has decent range of motion of his joints.   He has good muscle strength in the upper and lower extremities.  Neurological:  No focal neurological deficits.  LABORATORY STUDIES:  White cell count 7.9, hemoglobin 12.1, hematocrit 38.4, platelet count 247,000.  MCV is 93.  I looked at his blood smear.  His red cells appeared normochromic and normocytic.  I did not see target cells.  He had no anisocytosis.  There was no rouleaux formation.  I saw no spherocytes.  White cells appeared normal in morphology and maturation.  He has no immature myeloid or lymphoid forms.  He has no blasts.  Platelets are adequate in number and size.  IMPRESSION:  Andrew Andrew [name] is a nice 79 year old gentleman with multifactorial anemia.  We have improved his anemia nicely just with IV iron.  Hopefully, this is all that we will need to use.  His erythropoietin level is still on the low side.  As such, he may need an erythropoiesis-stimulating agent at some point down the road.  We will see what his iron levels are right now.  We will plan to get him back in another 2 months.  If his iron is still low, we can certainly give another dose.    ______________________________ Josph Macho, M.D. PRE/MEDQ  D:  01/16/2013  T:  01/17/2013  Job:  [id number]

## 2013-01-17 NOTE — Progress Notes (Signed)
This office note has been dictated.

## 2013-01-18 ENCOUNTER — Encounter: Payer: Self-pay | Admitting: Cardiology

## 2013-01-19 ENCOUNTER — Telehealth: Payer: Self-pay | Admitting: Nurse Practitioner

## 2013-01-19 NOTE — Telephone Encounter (Addendum)
Message copied by Glee Arvin on Fri Jan 19, 2013  3:26 PM ------      Message from: Arlan Organ R      Created: Wed Jan 17, 2013  4:24 PM       Call - iron is  Much better!!!  Cindee Lame ------Spoke w/pt and he verbalized his understanding and appreciation.

## 2013-01-20 ENCOUNTER — Ambulatory Visit: Payer: Medicare Other

## 2013-01-20 ENCOUNTER — Ambulatory Visit (INDEPENDENT_AMBULATORY_CARE_PROVIDER_SITE_OTHER): Payer: Medicare Other | Admitting: Family Medicine

## 2013-01-20 VITALS — BP 128/68 | HR 64 | Temp 97.3°F | Resp 18 | Ht 70.5 in | Wt 181.6 lb

## 2013-01-20 DIAGNOSIS — R05 Cough: Secondary | ICD-10-CM

## 2013-01-20 LAB — POCT CBC
Hemoglobin: 12.6 g/dL — AB (ref 14.1–18.1)
Lymph, poc: 1.1 (ref 0.6–3.4)
MCH, POC: 29 pg (ref 27–31.2)
MCHC: 30.7 g/dL — AB (ref 31.8–35.4)
MCV: 94.7 fL (ref 80–97)
MID (cbc): 0.5 (ref 0–0.9)
MPV: 8.6 fL (ref 0–99.8)
POC MID %: 6 %M (ref 0–12)
Platelet Count, POC: 279 10*3/uL (ref 142–424)
RBC: 4.34 M/uL — AB (ref 4.69–6.13)
WBC: 7.9 10*3/uL (ref 4.6–10.2)

## 2013-01-20 MED ORDER — BENZONATATE 100 MG PO CAPS: 100.0000 mg | ORAL_CAPSULE | Freq: Three times a day (TID) | ORAL | Status: DC | PRN

## 2013-01-20 MED ORDER — LEVOFLOXACIN 500 MG PO TABS: 500.0000 mg | ORAL_TABLET | Freq: Every day | ORAL | Status: DC

## 2013-01-20 NOTE — Patient Instructions (Signed)
Use the antibiotic as directed.  If you are not better in the next few days please let me know- Sooner if worse.

## 2013-01-20 NOTE — Progress Notes (Signed)
[facility] [address] [place] [address] [phone]  Date:  01/20/2013   Name:  Andrew [name]   DOB:  1931/03/09   MRN:  [id number]  PCP:  Lucilla Edin, MD    Chief Complaint: coughing up blood   History of Present Illness:  Andrew [name] is a 79 y.o. very pleasant male patient who presents with the following:  History of Fe deficiency anemia.  He uses xarelto for a fib.  History of cardiomyopathy.  Has a pacemaker.   He chronically has a cough- this is not unusal for him.  It seems to wax and wane.  However, he is concerned because he coughed up some bloody phlegm yesterday and twice today   He showed me a sample and it appears like dark yellow sputum He feels congested in his chest, but does not feel SOB.  His SOB is much better since he had his pacemaker placed last year.  No CP.  He does not feel bad, but he continues to cough a lot.    He has not noted any fever.   He has not noted any other bleeding such as blood in his stool or blood in his urine.    He does not have any swelling of his ankles.  He is taking lasix 30mg  TID.  He does take some K as well No orthopnea.   He uses O2 at night- 2 liters.  This is not new; he has used this for 3 years now.  No O2 during the day.    He was tx with levaquin in May of this year and did ok.    Cards is Dr. Caprice Red  Patient Active Problem List   Diagnosis Date Noted  . Shortness of breath 11/12/2012  . COPD with acute exacerbation 05/06/2012  . Sepsis 05/05/2012  . Community acquired pneumonia 05/04/2012  . Acute on chronic systolic CHF (congestive heart failure) 05/04/2012  . S/P biventricular cardiac pacemaker-[employer]tr[employer] 06/17/2011  . Acute renal insufficiency 04/06/2011  . Atrial fibrillation 04/06/2011  . Coronary atherosclerosis of native coronary artery 01/15/2011  . Hyperlipidemia 11/26/2010  . Paroxysmal ventricular tachycardia 11/05/2010  . Ischemic cardiomyopathy 10/14/2010  .  Atrial flutter   . Chronic systolic heart failure   . Hypertension   . Nonspecific (abnormal) findings on radiological and other examination of body structure 01/12/2010  . ABNORMAL LUNG XRAY 01/12/2010  . COPD UNSPECIFIED 12/02/2009  . DYSPNEA 12/02/2009  . LIVER FUNCTION TESTS, ABNORMAL, HX OF 12/02/2009  . UNSPECIFIED ANEMIA 05/05/2009  . GERD 04/04/2007  . PERSONAL HISTORY OF COLONIC POLYPS 04/04/2007    Past Medical History  Diagnosis Date  . Chronic systolic heart failure     a. Chronic LV systolic failure. b. Echo in Dec 2011 showed EF of 25% - f/u echo 02/2011: Mild LVH, EF 15%, posterior lateral akinesis, inferior akinesis, grade 1 diastolic dysfunction, mild LAE. c. s/p BiV pacemaker 06/2011.  [name] Atrial flutter     a. Off amiodarone because of increased LFTs  . COPD (chronic obstructive pulmonary disease)     Chronic SOB, O2 dependent  . Myocardial infarction 1994  . Gastro - esophageal reflux   . Claudication   . BPH (benign prostatic hyperplasia)   . Coronary artery disease     a. Myocardial infarction in 1984,  99 and 07. b. H/o multiple RCA stents. c.  LHC 04/2011: without obstructive disease. oLM 25%, mLAD 30%, pCFX 25%, prox to  mid RCA stent ok, dRCA 30% and 70%, pPDA 25%, mPL 25-30%, EF 25%.  [name] Hypertension   . Atrial fibrillation     a. On Xarelto.  . Hemorrhoids   . Hyperlipidemia     statin intolerant 2/2 myalgias  . Hx of adenomatous colonic polyps   . Hx of colonoscopy   . Chronic renal insufficiency   . Biventricular cardiac pacemaker in situ 3.2013  . NSVT (nonsustained ventricular tachycardia)     a. 04/2012- 6 beats on tele.  [name] Shortness of breath   . Arthritis     hips ,back ,hands    Past Surgical History  Procedure Laterality Date  . Cardiac catheterization  2008  . Cataract extraction    . Salivary gland surgery    . Coronary angioplasty with stent placement    . Insert / replace / remove pacemaker  06/16/2011  . Tee without cardioversion   05/08/2012    Procedure: TRANSESOPHAGEAL ECHOCARDIOGRAM (TEE);  Surgeon: Vesta Mixer, MD;  Location: [facility] ENDOSCOPY;  Service: Cardiovascular;  Laterality: N/A;    History  Substance Use Topics  . Smoking status: Former Smoker -- 1.50 packs/day for 55 years    Quit date: 01/03/2009  . Smokeless tobacco: Never Used  . Alcohol Use: Yes     Comment: occasional    Family History  Problem Relation Age of Onset  . Coronary artery disease Father   . Heart disease Mother   . Colon cancer Neg Hx   . Heart disease Brother     Allergies  Allergen Reactions  . Codeine Nausea And Vomiting  . Inspra [Eplerenone] Other (See Comments)    Stomach problems  . Spironolactone Other (See Comments)    Gynecomastia  . Doxycycline Itching and Rash  . Sulfonamide Derivatives Itching and Rash    Medication list has been reviewed and updated.  Current Outpatient Prescriptions on File Prior to Visit  Medication Sig Dispense Refill  . albuterol (PROVENTIL) (2.5 MG/3ML) 0.083% nebulizer solution Take 3 mLs (2.5 mg total) by nebulization every 6 (six) hours as needed for wheezing.  75 mL  12  . ALPRAZolam (XANAX) 0.25 MG tablet Take 0.25 mg by mouth daily as needed. For anxiety.      . carvedilol (COREG) 25 MG tablet TAKE 1 TABLET TWICE A DAY WITH A MEAL  180 tablet  3  . esomeprazole (NEXIUM) 40 MG capsule Take 1 capsule (40 mg total) by mouth 2 (two) times daily. PATIENT NEEDS OFFICE VISIT FOR ADDITIONAL REFILLS  180 capsule  3  . fluticasone (FLONASE) 50 MCG/ACT nasal spray Place 2 sprays into the nose as needed.      . Fluticasone-Salmeterol (ADVAIR DISKUS) 250-50 MCG/DOSE AEPB Inhale 1 puff into the lungs 2 (two) times daily.  3 each  3  . furosemide (LASIX) 40 MG tablet Take 90 mg by mouth daily. 90 MG. DAILY      . hydrALAZINE (APRESOLINE) 25 MG tablet 25 mg as needed. Prn      . HYDROcodone-acetaminophen (NORCO/VICODIN) 5-325 MG per tablet Take 1 tablet by mouth every 6 (six) hours as needed for  pain.      [name] ipratropium (ATROVENT) 0.02 % nebulizer solution Take 2.5 mLs (500 mcg total) by nebulization 4 (four) times daily.  75 mL  12  . losartan (COZAAR) 50 MG tablet TAKE 1 TABLET TWICE A DAY  180 tablet  2  . NITROSTAT 0.4 MG SL tablet PLACE 1 TABLET (0.4 MG TOTAL) UNDER  THE TONGUE EVERY 5 (FIVE) MINUTES AS NEEDED FOR CHEST PAIN.  25 tablet  1  . potassium chloride (KLOR-CON M10) 10 MEQ tablet Take 10 mEq by mouth daily. Every other day      . PROAIR HFA 108 (90 BASE) MCG/ACT inhaler INHALE 1 TO 2 PUFFS EVERY 6 HOURS AS NEEDED  1 each  3  . Tamsulosin HCl (FLOMAX) 0.4 MG CAPS Take 0.4 mg by mouth 2 (two) times daily.       [name] 20 MG TABS TAKE 1 TABLET DAILY  90 tablet  3  . albuterol (2.5 MG/3ML) 0.083% NEBU 3 mL, albuterol (5 MG/ML) 0.5% NEBU 0.5 mL Inhale 2.5 mg into the lungs 4 (four) times daily. For shortness of breath.      . [DISCONTINUED] metoprolol tartrate (LOPRESSOR) 25 MG tablet Take 1/2 tablet every other day   45 tablet  3   No current facility-administered medications on file prior to visit.    Review of Systems:  As per HPI- otherwise negative.   Physical Examination: Filed Vitals:   01/20/13 1316  BP: 128/68  Pulse: 64  Temp: 97.3 F (36.3 C)  Resp: 18   Filed Vitals:   01/20/13 1316  Height: 5' 10.5" (1.791 m)  Weight: 181 lb 9.6 oz (82.373 kg)   Body mass index is 25.68 kg/(m^2). Ideal Body Weight: Weight in (lb) to have BMI = 25: 176.4  GEN: WDWN, NAD, Non-toxic, A & O x 3, looks well HEENT: Atraumatic, Normocephalic. Neck supple. No masses, No LAD.  Bilateral TM wnl, oropharynx normal.  PEERL,EOMI.   Ears and Nose: No external deformity. CV: RRR, No M/G/R. No JVD. No thrill. No extra heart sounds. PULM: CTA B, no wheezes, crackles, rhonchi. No retractions. No resp. distress. No accessory muscle use. ABD: S, NT, ND. No rebound. No HSM. EXTR: No c/c/e NEURO Normal gait.  PSYCH: Normally interactive. Conversant. Not depressed or anxious  appearing.  Calm demeanor.    Wt Readings from Last 3 Encounters:  01/20/13 181 lb 9.6 oz (82.373 kg)  01/17/13 183 lb (83.008 kg)  01/16/13 184 lb (83.462 kg)    [facility] reading (PRIMARY) by  Dr. Patsy Lager. CXR: compared to CXR from August.  Chest appears more clear today, no definite infiltrate CHEST 2 VIEW  COMPARISON: 11/07/2012  FINDINGS: Cardiac silhouette is normal in size. The mediastinum is normal in contour. No hilar masses.  Interstitial thickening leads to a reticular nodular pattern. This is stable. There is stable apical pleural parenchymal scarring. The lungs are hyperexpanded. No pleural effusion or pneumothorax. Stable left anterior chest wall sequential pacemaker.  IMPRESSION: No acute cardiopulmonary disease.  COPD.   Results for orders placed in visit on 01/20/13  POCT CBC      Result Value Range   WBC 7.9  4.6 - 10.2 K/uL   Lymph, poc 1.1  0.6 - 3.4   POC LYMPH PERCENT 13.3  10 - 50 %L   MID (cbc) 0.5  0 - 0.9   POC MID % 6.0  0 - 12 %M   POC Granulocyte 6.4  2 - 6.9   Granulocyte percent 80.7 (*) 37 - 80 %G   RBC 4.34 (*) 4.69 - 6.13 M/uL   Hemoglobin 12.6 (*) 14.1 - 18.1 g/dL   HCT, POC 40.9 (*) 81.1 - 53.7 %   MCV 94.7  80 - 97 fL   MCH, POC 29.0  27 - 31.2 pg   MCHC 30.7 (*) 31.8 -  35.4 g/dL   RDW, POC 16.1     Platelet Count, POC 279  142 - 424 K/uL   MPV 8.6  0 - 99.8 fL     Assessment and Plan: Cough - Plan: DG Chest 2 View, POCT CBC, levofloxacin (LEVAQUIN) 500 MG tablet, benzonatate (TESSALON) 100 MG capsule  Patient with severe COPD, now with bloody sputum.  Will cover with levaquin and tessalon.  Did discuss with cardiologist on call regarding use of levaquin in pts with long Qt- as he is paced we cannot accurately determine his Qt and he can likley safely use levaquin.    Anemia is much better.   Signed Abbe Amsterdam, MD

## 2013-02-25 ENCOUNTER — Other Ambulatory Visit: Payer: Self-pay | Admitting: Cardiology

## 2013-03-09 ENCOUNTER — Ambulatory Visit (INDEPENDENT_AMBULATORY_CARE_PROVIDER_SITE_OTHER): Payer: Medicare Other | Admitting: Emergency Medicine

## 2013-03-09 ENCOUNTER — Ambulatory Visit: Payer: Medicare Other

## 2013-03-09 VITALS — BP 138/74 | HR 74 | Temp 97.8°F | Resp 17 | Ht 70.0 in | Wt 188.0 lb

## 2013-03-09 DIAGNOSIS — R05 Cough: Secondary | ICD-10-CM

## 2013-03-09 DIAGNOSIS — J209 Acute bronchitis, unspecified: Secondary | ICD-10-CM

## 2013-03-09 MED ORDER — BENZONATATE 100 MG PO CAPS: 100.0000 mg | ORAL_CAPSULE | Freq: Three times a day (TID) | ORAL | Status: DC | PRN

## 2013-03-09 MED ORDER — LEVOFLOXACIN 500 MG PO TABS: 500.0000 mg | ORAL_TABLET | Freq: Every day | ORAL | Status: DC

## 2013-03-09 NOTE — Progress Notes (Signed)
Subjective:    Patient ID: Andrew [name], male    DOB: [birth year], 79 y.o.   MRN: [id number]  This chart was scribed for Andrew Gobble, MD by Dorothey Baseman, ED Scribe.   Chief Complaint  Patient presents with  . chest congestion x3 days    HPI Andrew [name] is a 79 y.o. Male with a history of COPD who presents to [facility] complaining of congestion with associated cough and wheezes. He reports that his cough started to become productive this morning. He reports taking Mucinex at home with temporary relief. He reports using his atrovent and albuterol nebulizer treatment at home around 4.5 hours ago. Patient reports that he was seen here in October for similar complaints and was started on a course of antibiotics, which he states was effective at relieving his symptoms until his current onset. Patient also has a history of chronic systolic heart failure, HTN, hyperlipidemia, and coronary atherosclerosis.  Past Medical History  Diagnosis Date  . Chronic systolic heart failure     a. Chronic LV systolic failure. b. Echo in Dec 2011 showed EF of 25% - f/u echo 02/2011: Mild LVH, EF 15%, posterior lateral akinesis, inferior akinesis, grade 1 diastolic dysfunction, mild LAE. c. s/p BiV pacemaker 06/2011.  Andrew [name] Atrial flutter     a. Off amiodarone because of increased LFTs  . COPD (chronic obstructive pulmonary disease)     Chronic SOB, O2 dependent  . Myocardial infarction 1994  . Gastro - esophageal reflux   . Claudication   . BPH (benign prostatic hyperplasia)   . Coronary artery disease     a. Myocardial infarction in 1984,  99 and 07. b. H/o multiple RCA stents. c.  LHC 04/2011: without obstructive disease. oLM 25%, mLAD 30%, pCFX 25%, prox to mid RCA stent ok, dRCA 30% and 70%, pPDA 25%, mPL 25-30%, EF 25%.  Andrew [name] Hypertension   . Atrial fibrillation     a. On Xarelto.  . Hemorrhoids   . Hyperlipidemia     statin intolerant 2/2 myalgias  . Hx of adenomatous colonic  polyps   . Hx of colonoscopy   . Chronic renal insufficiency   . Biventricular cardiac pacemaker in situ 3.2013  . NSVT (nonsustained ventricular tachycardia)     a. 04/2012- 6 beats on tele.  Andrew [name] Shortness of breath   . Arthritis     hips ,back ,hands   Current Outpatient Prescriptions on File Prior to Visit  Medication Sig Dispense Refill  . albuterol (2.5 MG/3ML) 0.083% NEBU 3 mL, albuterol (5 MG/ML) 0.5% NEBU 0.5 mL Inhale 2.5 mg into the lungs 4 (four) times daily. For shortness of breath.      Andrew [name] albuterol (PROVENTIL) (2.5 MG/3ML) 0.083% nebulizer solution Take 3 mLs (2.5 mg total) by nebulization every 6 (six) hours as needed for wheezing.  75 mL  12  . ALPRAZolam (XANAX) 0.25 MG tablet Take 0.25 mg by mouth daily as needed. For anxiety.      . benzonatate (TESSALON) 100 MG capsule Take 1 capsule (100 mg total) by mouth 3 (three) times daily as needed for cough.  40 capsule  0  . carvedilol (COREG) 25 MG tablet TAKE 1 TABLET TWICE A DAY WITH A MEAL  180 tablet  3  . esomeprazole (NEXIUM) 40 MG capsule Take 1 capsule (40 mg total) by mouth 2 (two) times daily. PATIENT NEEDS OFFICE VISIT FOR ADDITIONAL REFILLS  180 capsule  3  .  fluticasone (FLONASE) 50 MCG/ACT nasal spray Place 2 sprays into the nose as needed.      . Fluticasone-Salmeterol (ADVAIR DISKUS) 250-50 MCG/DOSE AEPB Inhale 1 puff into the lungs 2 (two) times daily.  3 each  3  . furosemide (LASIX) 40 MG tablet TAKE AS DIRECTED  135 tablet  0  . hydrALAZINE (APRESOLINE) 25 MG tablet 25 mg 3 (three) times daily. Prn      . HYDROcodone-acetaminophen (NORCO/VICODIN) 5-325 MG per tablet Take 1 tablet by mouth every 6 (six) hours as needed for pain.      Andrew [name] ipratropium (ATROVENT) 0.02 % nebulizer solution Take 2.5 mLs (500 mcg total) by nebulization 4 (four) times daily.  75 mL  12  . levofloxacin (LEVAQUIN) 500 MG tablet Take 1 tablet (500 mg total) by mouth daily.  7 tablet  0  . losartan (COZAAR) 50 MG tablet TAKE 1 TABLET TWICE A  DAY  180 tablet  2  . NITROSTAT 0.4 MG SL tablet PLACE 1 TABLET (0.4 MG TOTAL) UNDER THE TONGUE EVERY 5 (FIVE) MINUTES AS NEEDED FOR CHEST PAIN.  25 tablet  1  . potassium chloride (KLOR-CON M10) 10 MEQ tablet Take 10 mEq by mouth daily. Every other day      . PROAIR HFA 108 (90 BASE) MCG/ACT inhaler INHALE 1 TO 2 PUFFS EVERY 6 HOURS AS NEEDED  1 each  3  . Tamsulosin HCl (FLOMAX) 0.4 MG CAPS Take 0.4 mg by mouth 2 (two) times daily.       Andrew [name] 20 MG TABS TAKE 1 TABLET DAILY  90 tablet  3  . [DISCONTINUED] metoprolol tartrate (LOPRESSOR) 25 MG tablet Take 1/2 tablet every other day   45 tablet  3   No current facility-administered medications on file prior to visit.   Allergies  Allergen Reactions  . Codeine Nausea And Vomiting  . Inspra [Eplerenone] Other (See Comments)    Stomach problems  . Spironolactone Other (See Comments)    Gynecomastia  . Doxycycline Itching and Rash  . Sulfonamide Derivatives Itching and Rash   Review of Systems  HENT: Positive for congestion.   Respiratory: Positive for cough and wheezing.        Objective:   Physical Exam  CONSTITUTIONAL: Well developed/well nourished HEAD: Normocephalic/atraumatic EYES: EOMI/PERRL ENMT: Mucous membranes moist, ears (external, canal, TMs) normal bilaterally, nose normal, oropharynx is clear and moist NECK: supple no meningeal signs, no anterior cervical lymphadenopathy SPINE:entire spine nontender CV: S1/S2 noted, no murmurs/rubs/gallops noted LUNGS: Lungs are clear to auscultation bilaterally, no apparent distress, decreased breath sounds in both bases, bilateral wheezes with good air exchange ABDOMEN: soft, nontender, no rebound or guarding GU:no cva tenderness NEURO: Pt is awake/alert, moves all extremitiesx4 EXTREMITIES: pulses normal, full ROM SKIN: warm, color normal PSYCH: no abnormalities of mood noted  BP 138/74  Pulse 74  Temp(Src) 97.8 F (36.6 C) (Oral)  Resp 17  Ht 5\' 10"  (1.778 m)  Wt  188 lb (85.276 kg)  BMI 26.98 kg/m2  SpO2 94% [facility] reading (PRIMARY) by  Dr. Cleta Alberts patient has COPD. He has a pacemaker in place. There are no pneumonic infiltrates.     1:55 PM- Preliminary x-ray results reviewed by Dr. Cleta Alberts. Chest x-ray does not indicate pneumonia.     Assessment & Plan:  1:41 PM- Will order a chest x-ray to rule out pneumonia. Discussed treatment plan with patient at bedside and patient verbalized agreement. Patient today with acute bronchitis we'll treat with Levaquin 500  one a day for 7 days refill given of his [employer]. He will continue his nebulizers treatments as previous.

## 2013-03-16 ENCOUNTER — Ambulatory Visit: Payer: Medicare Other

## 2013-03-16 ENCOUNTER — Ambulatory Visit (INDEPENDENT_AMBULATORY_CARE_PROVIDER_SITE_OTHER): Payer: Medicare Other | Admitting: Emergency Medicine

## 2013-03-16 VITALS — BP 132/76 | HR 74 | Temp 97.3°F | Resp 16 | Ht 70.0 in | Wt 186.6 lb

## 2013-03-16 DIAGNOSIS — R05 Cough: Secondary | ICD-10-CM

## 2013-03-16 DIAGNOSIS — J189 Pneumonia, unspecified organism: Secondary | ICD-10-CM

## 2013-03-16 DIAGNOSIS — R0989 Other specified symptoms and signs involving the circulatory and respiratory systems: Secondary | ICD-10-CM

## 2013-03-16 DIAGNOSIS — J209 Acute bronchitis, unspecified: Secondary | ICD-10-CM

## 2013-03-16 LAB — POCT CBC
Granulocyte percent: 82.2 %G — AB (ref 37–80)
HCT, POC: 37.1 % — AB (ref 43.5–53.7)
Lymph, poc: 1.4 (ref 0.6–3.4)
MCHC: 30.5 g/dL — AB (ref 31.8–35.4)
MCV: 96.9 fL (ref 80–97)
MID (cbc): 0.6 (ref 0–0.9)
MPV: 8.2 fL (ref 0–99.8)
POC Granulocyte: 9.4 — AB (ref 2–6.9)
POC LYMPH PERCENT: 12.5 %L (ref 10–50)
POC MID %: 5.3 %M (ref 0–12)
Platelet Count, POC: 359 10*3/uL (ref 142–424)
RDW, POC: 16.5 %

## 2013-03-16 MED ORDER — BENZONATATE 100 MG PO CAPS: 100.0000 mg | ORAL_CAPSULE | Freq: Three times a day (TID) | ORAL | Status: DC | PRN

## 2013-03-16 MED ORDER — LEVOFLOXACIN 500 MG PO TABS: 500.0000 mg | ORAL_TABLET | Freq: Every day | ORAL | Status: DC

## 2013-03-16 NOTE — Progress Notes (Addendum)
This chart was scribed for Lesle Chris, MD by Joaquin Music, ED Scribe. This patient was seen in room Room/bed 1 and the patient's care was started at 3:43 PM. Subjective:    Patient ID: Andrew [name], male    DOB: 16-Feb-1931, 79 y.o.   MRN: [id number] Chief Complaint  Patient presents with  . Follow-up    cough, chest pain (left side)x 2 weeks   HPI Andrew [name] is a 79 y.o. male who presents to the [facility] complaining of F/U apt. Pt states his L sided CP and cough has begun once again. He states his CP "had gone away until recently". Pt states he is on O2 all night and states at times uses during the day when he needs it. He states he is not having "very think phlegm". Pt reports his cough keeps him up in his sleep. He states he has been trying to drink fluids. Pt states he was taking mucinex until recently when he was prescribed abx.  Pt states he has had his flu shot this season. He states he has had his shingles vaccine recently.  Active Ambulatory Problems    Diagnosis Date Noted  . UNSPECIFIED ANEMIA 05/05/2009  . COPD UNSPECIFIED 12/02/2009  . GERD 04/04/2007  . DYSPNEA 12/02/2009  . Nonspecific (abnormal) findings on radiological and other examination of body structure 01/12/2010  . LIVER FUNCTION TESTS, ABNORMAL, HX OF 12/02/2009  . PERSONAL HISTORY OF COLONIC POLYPS 04/04/2007  . ABNORMAL LUNG XRAY 01/12/2010  . Atrial flutter   . Chronic systolic heart failure   . Hypertension   . Ischemic cardiomyopathy 10/14/2010  . Paroxysmal ventricular tachycardia 11/05/2010  . Hyperlipidemia 11/26/2010  . Coronary atherosclerosis of native coronary artery 01/15/2011  . Acute renal insufficiency 04/06/2011  . Atrial fibrillation 04/06/2011  . S/P biventricular cardiac pacemaker-[employer] 06/17/2011  . Community acquired pneumonia 05/04/2012  . Acute on chronic systolic CHF (congestive heart failure) 05/04/2012  . Sepsis 05/05/2012  . COPD with acute exacerbation  05/06/2012  . Shortness of breath 11/12/2012   Resolved Ambulatory Problems    Diagnosis Date Noted  . No Resolved Ambulatory Problems   Past Medical History  Diagnosis Date  . COPD (chronic obstructive pulmonary disease)   . Myocardial infarction 1994  . Gastro - esophageal reflux   . Claudication   . BPH (benign prostatic hyperplasia)   . Coronary artery disease   . Hemorrhoids   . Hx of adenomatous colonic polyps   . Hx of colonoscopy   . Chronic renal insufficiency   . Biventricular cardiac pacemaker in situ 3.2013  . NSVT (nonsustained ventricular tachycardia)   . Arthritis    Review of Systems  Respiratory: Positive for cough.   Cardiovascular: Positive for chest pain.   Objective:   Physical Exam CONSTITUTIONAL: Well developed/well nourished HEAD: Normocephalic/atraumatic EYES: EOMI/PERRL ENMT: Mucous membranes moist NECK: supple no meningeal signs SPINE:entire spine nontender CV: S1/S2 noted, no murmurs/rubs/gallops noted LUNGS: Lungs are clear to auscultation bilaterally, no apparent distress. Sounds are symmetrical. There are dry rub-like sounds in both bases. There is no dullness to percussion. ABDOMEN: soft, nontender, no rebound or guarding GU:no cva tenderness NEURO: Pt is awake/alert, moves all extremitiesx4 EXTREMITIES: pulses normal, full ROM SKIN: warm, color normal PSYCH: no abnormalities of mood noted [facility] reading (PRIMARY) by  Dr. Cleta Alberts COPD changes pacemaker in place. Please comment on patchy area in the left base and compared to previous x-ray   Results for orders placed in visit on 03/16/13  POCT CBC      Result Value Range   WBC 11.4 (*) 4.6 - 10.2 K/uL   Lymph, poc 1.4  0.6 - 3.4   POC LYMPH PERCENT 12.5  10 - 50 %L   MID (cbc) 0.6  0 - 0.9   POC MID % 5.3  0 - 12 %M   POC Granulocyte 9.4 (*) 2 - 6.9   Granulocyte percent 82.2 (*) 37 - 80 %G   RBC 3.83 (*) 4.69 - 6.13 M/uL   Hemoglobin 11.3 (*) 14.1 - 18.1 g/dL   HCT, POC 16.1 (*) 09.6  - 53.7 %   MCV 96.9  80 - 97 fL   MCH, POC 29.5  27 - 31.2 pg   MCHC 30.5 (*) 31.8 - 35.4 g/dL   RDW, POC 04.5     Platelet Count, POC 359  142 - 424 K/uL   MPV 8.2  0 - 99.8 fL    Triage Vitals:BP 132/76  Pulse 74  Temp(Src) 97.3 F (36.3 C) (Oral)  Resp 16  Ht 5\' 10"  (1.778 m)  Wt 186 lb 9.6 oz (84.641 kg)  BMI 26.77 kg/m2  SpO2 88% Assessment & Plan:  No diagnosis found.  Meds ordered this encounter  Medications  . guaiFENesin (MUCINEX) 600 MG 12 hr tablet    Sig: Take 1,200 mg by mouth 2 (two) times daily.   white count 11 4. We'll go ahead and refill Levaquin for one more week. He will see me in a week and if his symptoms are persistent we'll go ahead and do a CT scan at that time the radiologist feels there is still an ill-defined density in the left base we need to follow until clearance. Patient understands. I personally performed the services described in this documentation, which was scribed in my presence. The recorded information has been reviewed and is accurate.

## 2013-03-20 ENCOUNTER — Ambulatory Visit: Payer: Medicare Other

## 2013-03-20 ENCOUNTER — Ambulatory Visit (HOSPITAL_BASED_OUTPATIENT_CLINIC_OR_DEPARTMENT_OTHER): Payer: Medicare Other | Admitting: Hematology & Oncology

## 2013-03-20 ENCOUNTER — Encounter: Payer: Self-pay | Admitting: Hematology & Oncology

## 2013-03-20 ENCOUNTER — Other Ambulatory Visit (HOSPITAL_BASED_OUTPATIENT_CLINIC_OR_DEPARTMENT_OTHER): Payer: Medicare Other | Admitting: Lab

## 2013-03-20 VITALS — BP 134/47 | HR 68 | Temp 97.4°F | Resp 18 | Ht 70.0 in | Wt 185.0 lb

## 2013-03-20 DIAGNOSIS — D509 Iron deficiency anemia, unspecified: Secondary | ICD-10-CM

## 2013-03-20 DIAGNOSIS — D631 Anemia in chronic kidney disease: Secondary | ICD-10-CM

## 2013-03-20 DIAGNOSIS — D649 Anemia, unspecified: Secondary | ICD-10-CM

## 2013-03-20 DIAGNOSIS — Z862 Personal history of diseases of the blood and blood-forming organs and certain disorders involving the immune mechanism: Secondary | ICD-10-CM

## 2013-03-20 HISTORY — DX: Iron deficiency anemia, unspecified: D50.9

## 2013-03-20 LAB — IRON AND TIBC CHCC
Iron: 34 ug/dL — ABNORMAL LOW (ref 42–163)
TIBC: 146 ug/dL — ABNORMAL LOW (ref 202–409)

## 2013-03-20 LAB — BASIC METABOLIC PANEL
BUN: 18 mg/dL (ref 6–23)
CO2: 27 mEq/L (ref 19–32)
Calcium: 8.8 mg/dL (ref 8.4–10.5)
Creatinine, Ser: 1.28 mg/dL (ref 0.50–1.35)
Glucose, Bld: 106 mg/dL — ABNORMAL HIGH (ref 70–99)
Sodium: 132 mEq/L — ABNORMAL LOW (ref 135–145)

## 2013-03-20 LAB — RETICULOCYTES (CHCC)
ABS Retic: 42.2 10*3/uL (ref 19.0–186.0)
RBC.: 3.84 MIL/uL — ABNORMAL LOW (ref 4.22–5.81)
Retic Ct Pct: 1.1 % (ref 0.4–2.3)

## 2013-03-20 LAB — CBC WITH DIFFERENTIAL (CANCER CENTER ONLY)
BASO%: 0.2 % (ref 0.0–2.0)
EOS%: 1.5 % (ref 0.0–7.0)
Eosinophils Absolute: 0.2 10*3/uL (ref 0.0–0.5)
LYMPH%: 6.3 % — ABNORMAL LOW (ref 14.0–48.0)
MCH: 30.2 pg (ref 28.0–33.4)
MCV: 92 fL (ref 82–98)
MONO#: 0.9 10*3/uL (ref 0.1–0.9)
MONO%: 7.6 % (ref 0.0–13.0)
NEUT#: 10.1 10*3/uL — ABNORMAL HIGH (ref 1.5–6.5)
Platelets: 349 10*3/uL (ref 145–400)
RBC: 3.81 10*6/uL — ABNORMAL LOW (ref 4.20–5.70)
RDW: 15 % (ref 11.1–15.7)
WBC: 11.9 10*3/uL — ABNORMAL HIGH (ref 4.0–10.0)

## 2013-03-20 LAB — TECHNOLOGIST REVIEW CHCC SATELLITE

## 2013-03-20 NOTE — Progress Notes (Signed)
This office note has been dictated.

## 2013-03-22 NOTE — Progress Notes (Signed)
CC:   Andrew [name]. Andrew [name], M.D.  DIAGNOSES: 1. Iron-deficiency anemia. 2. Erythropoietin deficiency.  CURRENT THERAPY:  IV iron as indicated.  INTERIM HISTORY:  Andrew [name] comes in for his followup.  He is feeling a little bit rough.  He apparently was diagnosed with pneumonia.  He had a chest x-ray done on December 5th.  This did seem to show some left basilar pneumonia.  This was repeated 4 days ago.  There is still some infiltrate in the left base.  He is on Levaquin for this.  He has a little bit of "bloating."  He has had no nausea or vomiting. He has had no diarrhea.  When we last saw him in October, his ferritin was up to 201 with iron saturation of 25%.  He has had no bleeding.  He has had no rashes.  He is on quite a few medications.  PHYSICAL EXAMINATION:  General:  This is an elderly white gentleman in no obvious distress.  Vital Signs:  Temperature of 97.4, pulse 60, respiratory rate 18, blood pressure 134/47.  Weight is 185 pounds.  [name] and Neck:  Shows a normocephalic, atraumatic skull.  There are no ocular or oral lesions.  There are no palpable cervical or supraclavicular lymph nodes.  Lungs:  Clear bilaterally.  Cardiac:  Regular rate and rhythm with a normal S1, S2.  There are no murmurs, rubs, or bruits. Abdomen:  Soft.  He has good bowel sounds.  There is no fluid wave. There is no palpable abdominal mass.  There is no palpable hepatosplenomegaly.  Extremities:  Show no clubbing, cyanosis, or edema. Skin:  No rashes, ecchymosis, or petechiae.  LABORATORY STUDIES:  White cell count is 11.9, hemoglobin 11.5, hematocrit 35.2, platelet count 349.  MCV is 92.  IMPRESSION:  Andrew [name] is a nice 79 year old gentleman with iron- deficiency anemia.  He also has a low erythropoietin level.  When we first saw him, his erythropoietin level was only 23.  We will see what his iron studies show.  It is also possible that he may need another dose of IV iron.  I will  plan to see him back myself in probably another 6-8 weeks.  Hopefully, he will feel better with his pneumonia.  I reviewed all of his lab work with his wife.  I looked at his blood smear, I do not see anything that look unusual. He had no nucleated red cells.  He has good maturity of his white cells.    ______________________________ Josph Macho, M.D. PRE/MEDQ  D:  03/20/2013  T:  03/21/2013  Job:  [id number]

## 2013-03-28 ENCOUNTER — Telehealth: Payer: Self-pay | Admitting: Nurse Practitioner

## 2013-03-28 NOTE — Telephone Encounter (Addendum)
Message copied by Glee Arvin on Wed Mar 28, 2013 10:47 AM ------      Message from: Arlan Organ R      Created: Wed Mar 21, 2013  1:40 PM       Call - iron is dropping a little  Please set up feraheme at 1020mg  x 1 dose.  [name] ------LVM for pt to contact our office and set up an appointment for feraheme at his convience sometime within the next 2 weeks.

## 2013-03-30 ENCOUNTER — Telehealth: Payer: Self-pay | Admitting: Hematology & Oncology

## 2013-03-30 NOTE — Telephone Encounter (Signed)
Pt aware of 1-2 iron infusion

## 2013-04-02 ENCOUNTER — Other Ambulatory Visit: Payer: Self-pay | Admitting: *Deleted

## 2013-04-02 MED ORDER — RIVAROXABAN 20 MG PO TABS: ORAL_TABLET | ORAL | Status: DC

## 2013-04-06 ENCOUNTER — Ambulatory Visit (HOSPITAL_BASED_OUTPATIENT_CLINIC_OR_DEPARTMENT_OTHER): Payer: Medicare Other

## 2013-04-06 VITALS — BP 165/76 | HR 66 | Temp 96.9°F | Resp 20

## 2013-04-06 DIAGNOSIS — D509 Iron deficiency anemia, unspecified: Secondary | ICD-10-CM

## 2013-04-06 MED ORDER — SODIUM CHLORIDE 0.9 % IV SOLN
1020.0000 mg | Freq: Once | INTRAVENOUS | Status: AC
  Administered 2013-04-06: 1020 mg via INTRAVENOUS
  Filled 2013-04-06: qty 34

## 2013-04-06 MED ORDER — SODIUM CHLORIDE 0.9 % IV SOLN
Freq: Once | INTRAVENOUS | Status: AC
  Administered 2013-04-06: 14:00:00 via INTRAVENOUS

## 2013-04-06 NOTE — Patient Instructions (Signed)

## 2013-05-02 ENCOUNTER — Encounter: Payer: Self-pay | Admitting: Cardiology

## 2013-05-02 ENCOUNTER — Encounter (HOSPITAL_COMMUNITY): Payer: Medicare Other

## 2013-05-02 ENCOUNTER — Ambulatory Visit (INDEPENDENT_AMBULATORY_CARE_PROVIDER_SITE_OTHER): Payer: Medicare Other | Admitting: Nurse Practitioner

## 2013-05-02 ENCOUNTER — Encounter: Payer: Self-pay | Admitting: Nurse Practitioner

## 2013-05-02 ENCOUNTER — Ambulatory Visit (HOSPITAL_COMMUNITY): Payer: Medicare Other | Attending: Cardiology

## 2013-05-02 VITALS — BP 110/64 | HR 68 | Ht 70.0 in | Wt 186.4 lb

## 2013-05-02 DIAGNOSIS — Z87891 Personal history of nicotine dependence: Secondary | ICD-10-CM | POA: Insufficient documentation

## 2013-05-02 DIAGNOSIS — R0989 Other specified symptoms and signs involving the circulatory and respiratory systems: Secondary | ICD-10-CM | POA: Insufficient documentation

## 2013-05-02 DIAGNOSIS — J449 Chronic obstructive pulmonary disease, unspecified: Secondary | ICD-10-CM | POA: Insufficient documentation

## 2013-05-02 DIAGNOSIS — I6529 Occlusion and stenosis of unspecified carotid artery: Secondary | ICD-10-CM | POA: Insufficient documentation

## 2013-05-02 DIAGNOSIS — I5022 Chronic systolic (congestive) heart failure: Secondary | ICD-10-CM

## 2013-05-02 DIAGNOSIS — E785 Hyperlipidemia, unspecified: Secondary | ICD-10-CM | POA: Insufficient documentation

## 2013-05-02 DIAGNOSIS — I2510 Atherosclerotic heart disease of native coronary artery without angina pectoris: Secondary | ICD-10-CM | POA: Insufficient documentation

## 2013-05-02 DIAGNOSIS — R0602 Shortness of breath: Secondary | ICD-10-CM

## 2013-05-02 DIAGNOSIS — J4489 Other specified chronic obstructive pulmonary disease: Secondary | ICD-10-CM | POA: Insufficient documentation

## 2013-05-02 DIAGNOSIS — I10 Essential (primary) hypertension: Secondary | ICD-10-CM | POA: Insufficient documentation

## 2013-05-02 DIAGNOSIS — I658 Occlusion and stenosis of other precerebral arteries: Secondary | ICD-10-CM | POA: Insufficient documentation

## 2013-05-02 LAB — HEPATIC FUNCTION PANEL
ALT: 11 U/L (ref 0–53)
AST: 14 U/L (ref 0–37)
Albumin: 3.6 g/dL (ref 3.5–5.2)
Alkaline Phosphatase: 70 U/L (ref 39–117)
Bilirubin, Direct: 0 mg/dL (ref 0.0–0.3)
Total Bilirubin: 0.6 mg/dL (ref 0.3–1.2)
Total Protein: 7.2 g/dL (ref 6.0–8.3)

## 2013-05-02 LAB — LDL CHOLESTEROL, DIRECT: Direct LDL: 119.4 mg/dL

## 2013-05-02 LAB — BASIC METABOLIC PANEL
BUN: 19 mg/dL (ref 6–23)
CO2: 30 mEq/L (ref 19–32)
Calcium: 9 mg/dL (ref 8.4–10.5)
Chloride: 100 mEq/L (ref 96–112)
Creatinine, Ser: 1.4 mg/dL (ref 0.4–1.5)
GFR: 52.58 mL/min — ABNORMAL LOW (ref >60.00)
Glucose, Bld: 96 mg/dL (ref 70–99)
Potassium: 4.2 mEq/L (ref 3.5–5.1)
Sodium: 134 mEq/L — ABNORMAL LOW (ref 135–145)

## 2013-05-02 LAB — LIPID PANEL
Cholesterol: 184 mg/dL (ref 0–200)
HDL: 29.5 mg/dL — ABNORMAL LOW (ref >39.00)
Total CHOL/HDL Ratio: 6
Triglycerides: 243 mg/dL — ABNORMAL HIGH (ref 0.0–149.0)
VLDL: 48.6 mg/dL — ABNORMAL HIGH (ref 0.0–40.0)

## 2013-05-02 NOTE — Patient Instructions (Addendum)
Stay on your current medicines  We will check labs today  See me in 3 months  Stay active  Call the [facility] office at [phone] if you have any questions, problems or concerns.

## 2013-05-02 NOTE — Progress Notes (Signed)
Andrew [name] Date of Birth: 1930-10-30 Medical Record #[id number]  History of Present Illness: Andrew [name] is seen back today for a 3 month check. Seen for Dr. Zannie Kehr. He has a very complex medical history which includes chronic systolic heart failure. Now with BiV device in place. On Xarelto for his atrial flutter and off of amiodarone due to increased LFT's in the remote past. Has COPD. On oxygen only at night. Has known CAD with remote MIs and multiple stents. EF is 15% per echo back in 2012. Other issues include HTN, HLD, CRI, iron deficiency anemia - receiving IV iron. Was admitted back earlier in 2014 with sepsis - had ampicillin sensitive enterococcal bacteremia. His TEE was negative for a vegetation on his device.   Last seen by me back in September. Saw Dr. Antoine Poche back in October.   Comes back today. Here alone. Doing ok. Has had his carotid doppler study - those results pending but preliminary looked ok. He is doing ok for the most part. Weight is stable. Breathing ok. Has had some bouts of bronchitis but now resolved. No chest pain. Feels ok on his medicines. Wanting his lipids checked. Notes that the IV iron really makes him feel better.   Current Outpatient Prescriptions  Medication Sig Dispense Refill  . albuterol (PROVENTIL) (2.5 MG/3ML) 0.083% nebulizer solution Take 3 mLs (2.5 mg total) by nebulization every 6 (six) hours as needed for wheezing.  75 mL  12  . ALPRAZolam (XANAX) 0.25 MG tablet Take 0.25 mg by mouth daily as needed. For anxiety.      . carvedilol (COREG) 25 MG tablet TAKE 1 TABLET TWICE A DAY WITH A MEAL  180 tablet  3  . esomeprazole (NEXIUM) 40 MG capsule Take 1 capsule (40 mg total) by mouth 2 (two) times daily. PATIENT NEEDS OFFICE VISIT FOR ADDITIONAL REFILLS  180 capsule  3  . fluticasone (FLONASE) 50 MCG/ACT nasal spray Place 2 sprays into the nose as needed.      . Fluticasone-Salmeterol (ADVAIR DISKUS) 250-50 MCG/DOSE AEPB Inhale 1 puff into the lungs  2 (two) times daily.  3 each  3  . furosemide (LASIX) 40 MG tablet TAKE AS DIRECTED  135 tablet  0  . guaiFENesin (MUCINEX) 600 MG 12 hr tablet Take 1,200 mg by mouth 2 (two) times daily.      . hydrALAZINE (APRESOLINE) 25 MG tablet 25 mg 3 (three) times daily. Prn      . HYDROcodone-acetaminophen (NORCO/VICODIN) 5-325 MG per tablet Take 1 tablet by mouth every 6 (six) hours as needed for pain.      [name] ipratropium (ATROVENT) 0.02 % nebulizer solution Take 2.5 mLs (500 mcg total) by nebulization 4 (four) times daily.  75 mL  12  . losartan (COZAAR) 50 MG tablet TAKE 1 TABLET TWICE A DAY  180 tablet  2  . NITROSTAT 0.4 MG SL tablet PLACE 1 TABLET (0.4 MG TOTAL) UNDER THE TONGUE EVERY 5 (FIVE) MINUTES AS NEEDED FOR CHEST PAIN.  25 tablet  1  . potassium chloride (KLOR-CON M10) 10 MEQ tablet Take 10 mEq by mouth daily. Every other day      . PROAIR HFA 108 (90 BASE) MCG/ACT inhaler INHALE 1 TO 2 PUFFS EVERY 6 HOURS AS NEEDED  1 each  3  . Rivaroxaban (XARELTO) 20 MG TABS tablet TAKE 1 TABLET DAILY  90 tablet  2  . Tamsulosin HCl (FLOMAX) 0.4 MG CAPS Take 0.4 mg by mouth 2 (two) times daily.       . [  DISCONTINUED] metoprolol tartrate (LOPRESSOR) 25 MG tablet Take 1/2 tablet every other day   45 tablet  3   No current facility-administered medications for this visit.    Allergies  Allergen Reactions  . Codeine Nausea And Vomiting  . Inspra [Eplerenone] Other (See Comments)    Stomach problems  . Spironolactone Other (See Comments)    Gynecomastia  . Doxycycline Itching and Rash  . Sulfonamide Derivatives Itching and Rash    Past Medical History  Diagnosis Date  . Chronic systolic heart failure     a. Chronic LV systolic failure. b. Echo in Dec 2011 showed EF of 25% - f/u echo 02/2011: Mild LVH, EF 15%, posterior lateral akinesis, inferior akinesis, grade 1 diastolic dysfunction, mild LAE. c. s/p BiV pacemaker 06/2011.  [name]. Atrial flutter     a. Off amiodarone because of increased LFTs  .  COPD (chronic obstructive pulmonary disease)     Chronic SOB, O2 dependent  . Myocardial infarction 1994  . Gastro - esophageal reflux   . Claudication   . BPH (benign prostatic hyperplasia)   . Coronary artery disease     a. Myocardial infarction in 1984,  99 and 07. b. H/o multiple RCA stents. c.  LHC 04/2011: without obstructive disease. oLM 25%, mLAD 30%, pCFX 25%, prox to mid RCA stent ok, dRCA 30% and 70%, pPDA 25%, mPL 25-30%, EF 25%.  [name]. Hypertension   . Atrial fibrillation     a. On Xarelto.  . Hemorrhoids   . Hyperlipidemia     statin intolerant 2/2 myalgias  . Hx of adenomatous colonic polyps   . Hx of colonoscopy   . Chronic renal insufficiency   . Biventricular cardiac pacemaker in situ 3.2013  . NSVT (nonsustained ventricular tachycardia)     a. 04/2012- 6 beats on tele.  [name]. Shortness of breath   . Arthritis     hips ,back ,hands  . Iron deficiency anemia, unspecified 03/20/2013    Past Surgical History  Procedure Laterality Date  . Cardiac catheterization  2008  . Cataract extraction    . Salivary gland surgery    . Coronary angioplasty with stent placement    . Insert / replace / remove pacemaker  06/16/2011  . Tee without cardioversion  05/08/2012    Procedure: TRANSESOPHAGEAL ECHOCARDIOGRAM (TEE);  Surgeon: Vesta MixerPhilip J Nahser, MD;  [facility];  Service: Cardiovascular;  Laterality: N/A;    History  Smoking status  . Former Smoker -- 1.50 packs/day for 55 years  . Quit date: 01/03/2009  Smokeless tobacco  . Never Used    History  Alcohol Use  . Yes    Comment: occasional    Family History  Problem Relation Age of Onset  . Coronary artery disease Father   . Heart disease Mother   . Colon cancer Neg Hx   . Heart disease Brother     Review of Systems: The review of systems is per the HPI.  All other systems were reviewed and are negative.  Physical Exam: BP 110/64  Pulse 68  Ht 5\' 10"  (1.778 m)  Wt 186 lb 6.4 oz (84.55 kg)  BMI 26.75  kg/m2 Patient is very pleasant and in no acute distress. Skin is warm and dry. Color is normal.  HEENT is unremarkable. Normocephalic/atraumatic. PERRL. Sclera are nonicteric. Neck is supple. No masses. No JVD. Lungs are clear. Cardiac exam shows a regular rate and rhythm. Abdomen is soft. Extremities are without edema. Gait and ROM are  intact. No gross neurologic deficits noted.  Wt Readings from Last 3 Encounters:  05/02/13 186 lb 6.4 oz (84.55 kg)  03/20/13 185 lb (83.915 kg)  03/16/13 186 lb 9.6 oz (84.641 kg)     LABORATORY DATA: PENDING  Lab Results  Component Value Date   WBC 11.9* 03/20/2013   HGB 11.5* 03/20/2013   HCT 35.2* 03/20/2013   PLT 349 03/20/2013   GLUCOSE 106* 03/20/2013   CHOL 136 03/17/2012   TRIG 113.0 03/17/2012   HDL 30.70* 03/17/2012   LDLDIRECT 155.7 10/14/2010   LDLCALC 83 03/17/2012   ALT 9 12/15/2012   AST 16 12/15/2012   NA 132* 03/20/2013   K 4.3 03/20/2013   CL 96 03/20/2013   CREATININE 1.28 03/20/2013   BUN 18 03/20/2013   CO2 27 03/20/2013   TSH 4.286 08/09/2011   INR 1.77* 05/03/2012   HGBA1C 6.0* 03/10/2011     Assessment / Plan: 1. Systolic HF - looks compensated - feeling better clinically - no change in his current regimen.   2. COPD - past use of amiodarone - looks stable.   3. CAD - managed medically - no symptoms reported.   4. Iron deficiency anemia - continues with IV iron infusions.   5. BiV PPM - seeing Dr. Graciela Husbands next month for his check.   I will see him in about 3 months. Check labs today. Continue with current regimen.  Patient is agreeable to this plan and will call if any problems develop in the interim.   Rosalio Macadamia, RN, ANP-C  [facility]  [address]  [place], [place] [address]

## 2013-05-11 ENCOUNTER — Other Ambulatory Visit: Payer: Self-pay | Admitting: Cardiology

## 2013-05-21 ENCOUNTER — Encounter: Payer: Self-pay | Admitting: Hematology & Oncology

## 2013-05-21 ENCOUNTER — Ambulatory Visit (HOSPITAL_BASED_OUTPATIENT_CLINIC_OR_DEPARTMENT_OTHER): Payer: Medicare Other | Admitting: Hematology & Oncology

## 2013-05-21 ENCOUNTER — Other Ambulatory Visit (HOSPITAL_BASED_OUTPATIENT_CLINIC_OR_DEPARTMENT_OTHER): Payer: Medicare Other | Admitting: Lab

## 2013-05-21 ENCOUNTER — Ambulatory Visit: Payer: Medicare Other

## 2013-05-21 VITALS — BP 134/61 | HR 60 | Temp 97.4°F | Resp 18 | Ht 70.0 in | Wt 189.0 lb

## 2013-05-21 DIAGNOSIS — D649 Anemia, unspecified: Secondary | ICD-10-CM

## 2013-05-21 DIAGNOSIS — N039 Chronic nephritic syndrome with unspecified morphologic changes: Secondary | ICD-10-CM

## 2013-05-21 DIAGNOSIS — D509 Iron deficiency anemia, unspecified: Secondary | ICD-10-CM

## 2013-05-21 DIAGNOSIS — D631 Anemia in chronic kidney disease: Secondary | ICD-10-CM

## 2013-05-21 LAB — CBC WITH DIFFERENTIAL (CANCER CENTER ONLY)
BASO#: 0 10*3/uL (ref 0.0–0.2)
BASO%: 0.2 % (ref 0.0–2.0)
EOS ABS: 0.3 10*3/uL (ref 0.0–0.5)
EOS%: 4.8 % (ref 0.0–7.0)
HCT: 36.6 % — ABNORMAL LOW (ref 38.7–49.9)
HGB: 12 g/dL — ABNORMAL LOW (ref 13.0–17.1)
LYMPH#: 0.9 10*3/uL (ref 0.9–3.3)
LYMPH%: 15.8 % (ref 14.0–48.0)
MCH: 31.5 pg (ref 28.0–33.4)
MCHC: 32.8 g/dL (ref 32.0–35.9)
MCV: 96 fL (ref 82–98)
MONO#: 0.6 10*3/uL (ref 0.1–0.9)
MONO%: 10.5 % (ref 0.0–13.0)
NEUT#: 3.9 10*3/uL (ref 1.5–6.5)
NEUT%: 68.7 % (ref 40.0–80.0)
Platelets: 191 10*3/uL (ref 145–400)
RBC: 3.81 10*6/uL — ABNORMAL LOW (ref 4.20–5.70)
RDW: 14.4 % (ref 11.1–15.7)
WBC: 5.6 10*3/uL (ref 4.0–10.0)

## 2013-05-21 LAB — IRON AND TIBC CHCC
%SAT: 34 % (ref 20–55)
Iron: 56 ug/dL (ref 42–163)
TIBC: 164 ug/dL — ABNORMAL LOW (ref 202–409)
UIBC: 107 ug/dL — AB (ref 117–376)

## 2013-05-21 LAB — FERRITIN CHCC: FERRITIN: 282 ng/mL (ref 22–316)

## 2013-05-21 LAB — CHCC SATELLITE - SMEAR

## 2013-05-22 ENCOUNTER — Encounter: Payer: Self-pay | Admitting: Nurse Practitioner

## 2013-05-23 NOTE — Progress Notes (Signed)
[facility] [address] [place], [place]  [id number] [phone] (fax)   Your Providers PCP: Collene GobbleSteven A Daub, MD,  [phone]) Referring Provider: Collene GobbleSteven A Daub, MD,  [phone])    DIAGNOSES:  Problem List Items Addressed This Visit   Iron deficiency anemia, unspecified - Primary (Chronic)   Relevant Orders      CBC with Differential ([facility])      Iron and TIBC      Ferritin      Reticulocyte Count ([facility])      CURRENT THERAPY: IV iron with Feraheme INTERIM HISTORY: Andrew [name] comes in for followup. He is doing well. He feels better. He has had 3 doses of IV iron. His last was given on February 1. He's done well with this.  He's had no bleeding. He's had no change in bowel or bladder habits. There's been no problems with swelling. He's had no cough. He's had no pain issues.  PHYSICAL EXAMINATION: Well-developed well-nourished gentleman. He is elderly. Head and neck exam shows no ocular or oral lesions. There is no scleral icterus. He has no adenopathy in the neck. Lungs are clear. Cardiac exam regular in rhythm. Abdomen is soft. Has good bowel sounds. There is no fluid wave. There is no palpable liver or spleen. Extremities shows no clubbing cyanosis or edema. Skin exam no rashes. Neurological exam no focal neurological deficits.   Vital signs:   Filed Vitals:   05/21/13 1214  BP: 134/61  Pulse: 60  Temp: 97.4 F (36.3 C)  TempSrc: Oral  Resp: 18  Height: 5\' 10"  (1.778 m)  Weight: 189 lb (85.73 kg)      LABORATORY STUDIES:  CBC    Component Value Date/Time   WBC 5.6 05/21/2013 1126   WBC 11.4* 03/16/2013 1606   WBC 7.8 11/24/2012 1243   RBC 3.85* 05/21/2013 1126   RBC 3.83* 03/16/2013 1606   RBC 3.51* 11/24/2012 1243   HGB 12.0* 05/21/2013 1126   HGB 11.3* 03/16/2013 1606   HGB 9.8* 11/24/2012 1243   HCT 36.6* 05/21/2013 1126   HCT 37.1* 03/16/2013 1606   HCT 29.5* 11/24/2012 1243   PLT 191  05/21/2013 1126   PLT 329.0 11/24/2012 1243   MCV 96 05/21/2013 1126   MCV 96.9 03/16/2013 1606   MCV 84.1 11/24/2012 1243   MCH 31.5 05/21/2013 1126   MCH 29.5 03/16/2013 1606   MCH 29.2 05/10/2012 0520   MCHC 32.8 05/21/2013 1126   MCHC 30.5* 03/16/2013 1606   MCHC 33.2 11/24/2012 1243   RDW 14.4 05/21/2013 1126   RDW 16.3* 11/24/2012 1243   LYMPHSABS 0.9 05/21/2013 1126   LYMPHSABS 0.6* 11/24/2012 1243   MONOABS 1.0 11/24/2012 1243   EOSABS 0.3 05/21/2013 1126   EOSABS 0.4 11/24/2012 1243   BASOSABS 0.0 05/21/2013 1126   BASOSABS 0.0 11/24/2012 1243   h His iron studies show a Ferritin of 285 with an iron saturation of 34%.    IMPRESSION: Andrew [name] is a 80 y.o. male with a history of iron deficiency anemia. He is responding to iron.  There is no obvious blood loss. I think this might be a problem not absorbing well.  He also has erythropoietin deficiency. I don't think that we have to give him any Aranesp.  We'll get him back in another couple months.[name].  PLAN: We will plan to get him back to see us in another 2 months. If there is any problem  in between visits, he can certainly come back and see [place].   ______________________________  Josph Macho, M.D.  05/23/2013 9:53 AM

## 2013-05-24 ENCOUNTER — Ambulatory Visit (INDEPENDENT_AMBULATORY_CARE_PROVIDER_SITE_OTHER): Payer: Medicare Other | Admitting: Internal Medicine

## 2013-05-24 ENCOUNTER — Encounter: Payer: Self-pay | Admitting: Internal Medicine

## 2013-05-24 VITALS — BP 184/88 | HR 65 | Ht 70.5 in | Wt 188.0 lb

## 2013-05-24 DIAGNOSIS — I472 Ventricular tachycardia: Secondary | ICD-10-CM

## 2013-05-24 DIAGNOSIS — I4729 Other ventricular tachycardia: Secondary | ICD-10-CM

## 2013-05-24 DIAGNOSIS — I4891 Unspecified atrial fibrillation: Secondary | ICD-10-CM

## 2013-05-24 DIAGNOSIS — I5022 Chronic systolic (congestive) heart failure: Secondary | ICD-10-CM

## 2013-05-24 DIAGNOSIS — I255 Ischemic cardiomyopathy: Secondary | ICD-10-CM

## 2013-05-24 DIAGNOSIS — I2589 Other forms of chronic ischemic heart disease: Secondary | ICD-10-CM

## 2013-05-24 DIAGNOSIS — Z950 Presence of cardiac pacemaker: Secondary | ICD-10-CM

## 2013-05-24 DIAGNOSIS — I10 Essential (primary) hypertension: Secondary | ICD-10-CM

## 2013-05-24 LAB — MDC_IDC_ENUM_SESS_TYPE_INCLINIC
Battery Remaining Longevity: 67 mo
Battery Voltage: 3.01 V
Brady Statistic AP VS Percent: 0 %
Brady Statistic AS VS Percent: 0.19 %
Brady Statistic RA Percent Paced: 2.17 %
Brady Statistic RV Percent Paced: 99.81 %
Date Time Interrogation Session: 20150219171659
Lead Channel Impedance Value: 399 Ohm
Lead Channel Impedance Value: 456 Ohm
Lead Channel Impedance Value: 608 Ohm
Lead Channel Pacing Threshold Amplitude: 0.625 V
Lead Channel Pacing Threshold Pulse Width: 0.4 ms
Lead Channel Sensing Intrinsic Amplitude: 11 mV
Lead Channel Sensing Intrinsic Amplitude: 27.875 mV
Lead Channel Setting Pacing Amplitude: 2 V
Lead Channel Setting Pacing Amplitude: 2.5 V
Lead Channel Setting Pacing Pulse Width: 0.4 ms
Lead Channel Setting Sensing Sensitivity: 0.9 mV
MDC IDC MSMT LEADCHNL LV IMPEDANCE VALUE: 1330 Ohm
MDC IDC MSMT LEADCHNL LV IMPEDANCE VALUE: 703 Ohm
MDC IDC MSMT LEADCHNL LV IMPEDANCE VALUE: 893 Ohm
MDC IDC MSMT LEADCHNL LV IMPEDANCE VALUE: 988 Ohm
MDC IDC MSMT LEADCHNL RA IMPEDANCE VALUE: 361 Ohm
MDC IDC MSMT LEADCHNL RA IMPEDANCE VALUE: 513 Ohm
MDC IDC MSMT LEADCHNL RA PACING THRESHOLD AMPLITUDE: 0.625 V
MDC IDC MSMT LEADCHNL RA SENSING INTR AMPL: 2.75 mV
MDC IDC MSMT LEADCHNL RA SENSING INTR AMPL: 3.25 mV
MDC IDC MSMT LEADCHNL RV PACING THRESHOLD PULSEWIDTH: 0.4 ms
MDC IDC SET LEADCHNL LV PACING AMPLITUDE: 2 V
MDC IDC SET LEADCHNL LV PACING PULSEWIDTH: 0.4 ms
MDC IDC STAT BRADY AP VP PERCENT: 2.17 %
MDC IDC STAT BRADY AS VP PERCENT: 97.64 %
Zone Setting Detection Interval: 350 ms
Zone Setting Detection Interval: 400 ms

## 2013-05-24 LAB — RETICULOCYTES (CHCC)
ABS Retic: 42.4 10*3/uL (ref 19.0–186.0)
RBC.: 3.85 MIL/uL — ABNORMAL LOW (ref 4.22–5.81)
Retic Ct Pct: 1.1 % (ref 0.4–2.3)

## 2013-05-24 LAB — TRANSFERRIN RECEPTOR, SOLUABLE: Transferrin Receptor, Soluble: 1.11 mg/L (ref 0.76–1.76)

## 2013-05-24 NOTE — Assessment & Plan Note (Signed)
Cont current meds.

## 2013-05-24 NOTE — Progress Notes (Signed)
Patient Care Team: [name], MD as PCP - General (Family Medicine)   HPI  Andrew [name] is a 80 y.o. male Seen in followup for CRT-P implanted in the setting of ischemic cardiomyopathy. He also history of atrial fibrillation previously treated with amiodarone but this was discontinued 2/2  elevated liver function tests. He has a history of oxygen-dependent COPD  He is much improved following CRT implantation.    Past Medical History  Diagnosis Date  . Chronic systolic heart failure     a. Chronic LV systolic failure. b. Echo in Dec 2011 showed EF of 25% - f/u echo 02/2011: Mild LVH, EF 15%, posterior lateral akinesis, inferior akinesis, grade 1 diastolic dysfunction, mild LAE. c. s/p BiV pacemaker 06/2011.  [name] Atrial flutter     a. Off amiodarone because of increased LFTs  . COPD (chronic obstructive pulmonary disease)     Chronic SOB, O2 dependent  . Myocardial infarction 1994  . Gastro - esophageal reflux   . Claudication   . BPH (benign prostatic hyperplasia)   . Coronary artery disease     a. Myocardial infarction in 1984,  99 and 07. b. H/o multiple RCA stents. c.  LHC 04/2011: without obstructive disease. oLM 25%, mLAD 30%, pCFX 25%, prox to mid RCA stent ok, dRCA 30% and 70%, pPDA 25%, mPL 25-30%, EF 25%.  [name] Hypertension   . Atrial fibrillation     a. On Xarelto.  . Hemorrhoids   . Hyperlipidemia     statin intolerant 2/2 myalgias  . Hx of adenomatous colonic polyps   . Hx of colonoscopy   . Chronic renal insufficiency   . Biventricular cardiac pacemaker in situ 3.2013  . NSVT (nonsustained ventricular tachycardia)     a. 04/2012- 6 beats on tele.  [name] Shortness of breath   . Arthritis     hips ,back ,hands  . Iron deficiency anemia, unspecified 03/20/2013    Past Surgical History  Procedure Laterality Date  . Cardiac catheterization  2008  . Cataract extraction    . Salivary gland surgery    . Coronary angioplasty with stent placement    . Insert /  replace / remove pacemaker  06/16/2011  . Tee without cardioversion  05/08/2012    Procedure: TRANSESOPHAGEAL ECHOCARDIOGRAM (TEE);  Surgeon: Vesta Mixer, MD;  Location: [facility] ENDOSCOPY;  Service: Cardiovascular;  Laterality: N/A;    Current Outpatient Prescriptions  Medication Sig Dispense Refill  . albuterol (PROVENTIL) (2.5 MG/3ML) 0.083% nebulizer solution Take 3 mLs (2.5 mg total) by nebulization every 6 (six) hours as needed for wheezing.  75 mL  12  . ALPRAZolam (XANAX) 0.25 MG tablet Take 0.25 mg by mouth daily as needed. For anxiety.      . carvedilol (COREG) 25 MG tablet TAKE 1 TABLET TWICE A DAY WITH A MEAL  180 tablet  3  . esomeprazole (NEXIUM) 40 MG capsule Take 1 capsule (40 mg total) by mouth 2 (two) times daily. PATIENT NEEDS OFFICE VISIT FOR ADDITIONAL REFILLS  180 capsule  3  . fluticasone (FLONASE) 50 MCG/ACT nasal spray Place 2 sprays into the nose as needed.      . Fluticasone-Salmeterol (ADVAIR DISKUS) 250-50 MCG/DOSE AEPB Inhale 1 puff into the lungs 2 (two) times daily.  3 each  3  . furosemide (LASIX) 40 MG tablet TAKE AS DIRECTED  135 tablet  0  . guaiFENesin (MUCINEX) 600 MG 12 hr tablet Take 1,200 mg by mouth  2 (two) times daily.      . hydrALAZINE (APRESOLINE) 25 MG tablet 25 mg 3 (three) times daily. Prn      . HYDROcodone-acetaminophen (NORCO/VICODIN) 5-325 MG per tablet Take 1 tablet by mouth 2 (two) times daily. Arthritis pain      . ipratropium (ATROVENT) 0.02 % nebulizer solution Take 2.5 mLs (500 mcg total) by nebulization 4 (four) times daily.  75 mL  12  . losartan (COZAAR) 50 MG tablet TAKE 1 TABLET TWICE A DAY  180 tablet  2  . NITROSTAT 0.4 MG SL tablet PLACE 1 TABLET (0.4 MG TOTAL) UNDER THE TONGUE EVERY 5 (FIVE) MINUTES AS NEEDED FOR CHEST PAIN.  25 tablet  1  . potassium chloride (KLOR-CON M10) 10 MEQ tablet Take 10 mEq by mouth daily. Every other day      . PROAIR HFA 108 (90 BASE) MCG/ACT inhaler INHALE 1 TO 2 PUFFS EVERY 6 HOURS AS NEEDED  1 each  3    . Rivaroxaban (XARELTO) 20 MG TABS tablet TAKE 1 TABLET DAILY  90 tablet  2  . Tamsulosin HCl (FLOMAX) 0.4 MG CAPS Take 0.4 mg by mouth 2 (two) times daily.       . [DISCONTINUED] metoprolol tartrate (LOPRESSOR) 25 MG tablet Take 1/2 tablet every other day   45 tablet  3   No current facility-administered medications for this visit.    Allergies  Allergen Reactions  . Codeine Nausea And Vomiting  . Inspra [Eplerenone] Other (See Comments)    Stomach problems  . Spironolactone Other (See Comments)    Gynecomastia  . Doxycycline Itching and Rash  . Sulfonamide Derivatives Itching and Rash    Review of Systems negative except from HPI and PMH  Physical Exam BP 184/88  Pulse 65  Ht 5' 10.5" (1.791 m)  Wt 188 lb (85.276 kg)  BMI 26.58 kg/m2 Well developed and well nourished in no acute distress HENT normal E scleral and icterus clear Neck Supple JVP flat; carotids brisk and full Clear to ausculation m,Regular rate and rhythm, no murmurs gallops or rub Soft with active bowel sounds No clubbing cyanosis none Edema Alert and oriented, grossly normal motor and sensory function Skin Warm and Dry  nsr  P-synchronous/ AV  pacing   Assessment and  Plan

## 2013-05-24 NOTE — Assessment & Plan Note (Signed)
Poorly controlled.

## 2013-05-24 NOTE — Patient Instructions (Signed)
Your physician recommends that you continue on your current medications as directed. Please refer to the Current Medication list given to you today.  Remote monitoring is used to monitor your Pacemaker of ICD from home. This monitoring reduces the number of office visits required to check your device to one time per year. It allows us to keep an eye on the functioning of your device to ensure it is working properly. You are scheduled for a device check from home on 08/28/13. You may send your transmission at any time that day. If you have a wireless device, the transmission will be sent automatically. After your physician reviews your transmission, you will receive a postcard with your next transmission date.  Your physician wants you to follow-up in: 1 year with Dr. Graciela HusbandsKlein.  You will receive a reminder letter in the mail two months in advance. If you don't receive a letter, please call our office to schedule the follow-up appointment.

## 2013-05-24 NOTE — Assessment & Plan Note (Signed)
No intercurrent Ventricular tachycardia  

## 2013-05-24 NOTE — Assessment & Plan Note (Signed)
The patient's device was interrogated.  The information was reviewed. No changes were made in the programming.    

## 2013-05-24 NOTE — Assessment & Plan Note (Signed)
euvolemic 

## 2013-06-12 ENCOUNTER — Telehealth: Payer: Self-pay | Admitting: Cardiology

## 2013-06-12 NOTE — Telephone Encounter (Signed)
In reviewing chart - pt was started on Xarelto after his hospitalization 12/12.  This should not be a new medication for him.  Left message to call back to discuss.

## 2013-06-12 NOTE — Telephone Encounter (Signed)
New message    Patient wants to know why he receive a new rx, when he did not know anything about it.  - xarelto.

## 2013-06-13 NOTE — Telephone Encounter (Signed)
Left another message for pt to call back.

## 2013-06-13 NOTE — Telephone Encounter (Signed)
Spoke with pt - he reports he received at [facility]ill [facility] for Xarelto but he still has a lot (about 60 tablets) left.  He denies missing doses.  Advised that we filled the RX 12/14 and he should have received it by mid January at the latest.  With this being the case he should only have a few more tablets left and that is why [facility] send out the refill.  Advised pt to make sure he is taking EVERY DAY!!! He reports again that he is.  He has other questions about why [facility] sent the RX under a different refill number.  Instructed pt he will need to call them about that.  He states understanding.

## 2013-06-25 ENCOUNTER — Inpatient Hospital Stay (HOSPITAL_COMMUNITY)
Admission: EM | Admit: 2013-06-25 | Discharge: 2013-06-27 | DRG: 191 | Disposition: A | Discharge status: Home Health | Payer: Medicare Other | Attending: Family Medicine | Admitting: Family Medicine

## 2013-06-25 ENCOUNTER — Other Ambulatory Visit: Payer: Self-pay | Admitting: Nurse Practitioner

## 2013-06-25 ENCOUNTER — Ambulatory Visit (INDEPENDENT_AMBULATORY_CARE_PROVIDER_SITE_OTHER): Payer: Medicare Other | Admitting: Family Medicine

## 2013-06-25 ENCOUNTER — Telehealth: Payer: Self-pay | Admitting: Hematology & Oncology

## 2013-06-25 ENCOUNTER — Other Ambulatory Visit (HOSPITAL_BASED_OUTPATIENT_CLINIC_OR_DEPARTMENT_OTHER): Payer: Medicare Other

## 2013-06-25 ENCOUNTER — Encounter (HOSPITAL_COMMUNITY): Payer: Self-pay | Admitting: Emergency Medicine

## 2013-06-25 ENCOUNTER — Ambulatory Visit: Payer: Medicare Other

## 2013-06-25 VITALS — BP 112/65 | HR 91 | Temp 98.3°F | Resp 20 | Wt 185.0 lb

## 2013-06-25 DIAGNOSIS — R0600 Dyspnea, unspecified: Secondary | ICD-10-CM | POA: Diagnosis present

## 2013-06-25 DIAGNOSIS — Z9849 Cataract extraction status, unspecified eye: Secondary | ICD-10-CM

## 2013-06-25 DIAGNOSIS — N401 Enlarged prostate with lower urinary tract symptoms: Secondary | ICD-10-CM | POA: Diagnosis present

## 2013-06-25 DIAGNOSIS — N138 Other obstructive and reflux uropathy: Secondary | ICD-10-CM | POA: Diagnosis present

## 2013-06-25 DIAGNOSIS — I4891 Unspecified atrial fibrillation: Secondary | ICD-10-CM | POA: Diagnosis present

## 2013-06-25 DIAGNOSIS — I472 Ventricular tachycardia, unspecified: Secondary | ICD-10-CM | POA: Diagnosis present

## 2013-06-25 DIAGNOSIS — J449 Chronic obstructive pulmonary disease, unspecified: Secondary | ICD-10-CM

## 2013-06-25 DIAGNOSIS — Z950 Presence of cardiac pacemaker: Secondary | ICD-10-CM

## 2013-06-25 DIAGNOSIS — I5022 Chronic systolic (congestive) heart failure: Secondary | ICD-10-CM | POA: Diagnosis present

## 2013-06-25 DIAGNOSIS — I4729 Other ventricular tachycardia: Secondary | ICD-10-CM | POA: Diagnosis present

## 2013-06-25 DIAGNOSIS — I2589 Other forms of chronic ischemic heart disease: Secondary | ICD-10-CM | POA: Diagnosis present

## 2013-06-25 DIAGNOSIS — D649 Anemia, unspecified: Secondary | ICD-10-CM

## 2013-06-25 DIAGNOSIS — Z9981 Dependence on supplemental oxygen: Secondary | ICD-10-CM

## 2013-06-25 DIAGNOSIS — I129 Hypertensive chronic kidney disease with stage 1 through stage 4 chronic kidney disease, or unspecified chronic kidney disease: Secondary | ICD-10-CM | POA: Diagnosis present

## 2013-06-25 DIAGNOSIS — R630 Anorexia: Secondary | ICD-10-CM

## 2013-06-25 DIAGNOSIS — I10 Essential (primary) hypertension: Secondary | ICD-10-CM | POA: Diagnosis present

## 2013-06-25 DIAGNOSIS — R0602 Shortness of breath: Secondary | ICD-10-CM

## 2013-06-25 DIAGNOSIS — J4489 Other specified chronic obstructive pulmonary disease: Secondary | ICD-10-CM

## 2013-06-25 DIAGNOSIS — Z881 Allergy status to other antibiotic agents status: Secondary | ICD-10-CM

## 2013-06-25 DIAGNOSIS — Z882 Allergy status to sulfonamides status: Secondary | ICD-10-CM

## 2013-06-25 DIAGNOSIS — Z951 Presence of aortocoronary bypass graft: Secondary | ICD-10-CM

## 2013-06-25 DIAGNOSIS — R339 Retention of urine, unspecified: Secondary | ICD-10-CM | POA: Diagnosis present

## 2013-06-25 DIAGNOSIS — I509 Heart failure, unspecified: Secondary | ICD-10-CM | POA: Diagnosis present

## 2013-06-25 DIAGNOSIS — Z8249 Family history of ischemic heart disease and other diseases of the circulatory system: Secondary | ICD-10-CM

## 2013-06-25 DIAGNOSIS — I252 Old myocardial infarction: Secondary | ICD-10-CM

## 2013-06-25 DIAGNOSIS — R0902 Hypoxemia: Secondary | ICD-10-CM

## 2013-06-25 DIAGNOSIS — I4892 Unspecified atrial flutter: Secondary | ICD-10-CM | POA: Diagnosis present

## 2013-06-25 DIAGNOSIS — J441 Chronic obstructive pulmonary disease with (acute) exacerbation: Secondary | ICD-10-CM | POA: Diagnosis present

## 2013-06-25 DIAGNOSIS — D72829 Elevated white blood cell count, unspecified: Secondary | ICD-10-CM

## 2013-06-25 DIAGNOSIS — I739 Peripheral vascular disease, unspecified: Secondary | ICD-10-CM | POA: Diagnosis present

## 2013-06-25 DIAGNOSIS — Z888 Allergy status to other drugs, medicaments and biological substances status: Secondary | ICD-10-CM

## 2013-06-25 DIAGNOSIS — Z860100 Personal history of colon polyps, unspecified: Secondary | ICD-10-CM

## 2013-06-25 DIAGNOSIS — Z9861 Coronary angioplasty status: Secondary | ICD-10-CM

## 2013-06-25 DIAGNOSIS — E785 Hyperlipidemia, unspecified: Secondary | ICD-10-CM | POA: Diagnosis present

## 2013-06-25 DIAGNOSIS — D509 Iron deficiency anemia, unspecified: Secondary | ICD-10-CM

## 2013-06-25 DIAGNOSIS — Z87891 Personal history of nicotine dependence: Secondary | ICD-10-CM

## 2013-06-25 DIAGNOSIS — Z66 Do not resuscitate: Secondary | ICD-10-CM | POA: Diagnosis present

## 2013-06-25 DIAGNOSIS — I2510 Atherosclerotic heart disease of native coronary artery without angina pectoris: Secondary | ICD-10-CM | POA: Diagnosis present

## 2013-06-25 DIAGNOSIS — K219 Gastro-esophageal reflux disease without esophagitis: Secondary | ICD-10-CM | POA: Diagnosis present

## 2013-06-25 LAB — CBC & DIFF AND RETIC
BASO%: 0.1 % (ref 0.0–2.0)
Basophils Absolute: 0 10*3/uL (ref 0.0–0.1)
EOS ABS: 0 10*3/uL (ref 0.0–0.5)
EOS%: 0.1 % (ref 0.0–7.0)
HCT: 33.3 % — ABNORMAL LOW (ref 38.4–49.9)
HGB: 11.1 g/dL — ABNORMAL LOW (ref 13.0–17.1)
Immature Retic Fract: 5.3 % (ref 3.00–10.60)
LYMPH#: 0.5 10*3/uL — AB (ref 0.9–3.3)
LYMPH%: 2.8 % — ABNORMAL LOW (ref 14.0–49.0)
MCH: 31.5 pg (ref 27.2–33.4)
MCHC: 33.3 g/dL (ref 32.0–36.0)
MCV: 94.6 fL (ref 79.3–98.0)
MONO#: 0.6 10*3/uL (ref 0.1–0.9)
MONO%: 3.2 % (ref 0.0–14.0)
NEUT%: 93.8 % — ABNORMAL HIGH (ref 39.0–75.0)
NEUTROS ABS: 18 10*3/uL — AB (ref 1.5–6.5)
Platelets: 230 10*3/uL (ref 140–400)
RBC: 3.52 10*6/uL — AB (ref 4.20–5.82)
RDW: 14.3 % (ref 11.0–14.6)
RETIC %: 1.14 % (ref 0.80–1.80)
Retic Ct Abs: 40.13 10*3/uL (ref 34.80–93.90)
WBC: 19.1 10*3/uL — AB (ref 4.0–10.3)

## 2013-06-25 LAB — URINE MICROSCOPIC-ADD ON

## 2013-06-25 LAB — BASIC METABOLIC PANEL
BUN: 32 mg/dL — ABNORMAL HIGH (ref 6–23)
CO2: 25 meq/L (ref 19–32)
Calcium: 8.8 mg/dL (ref 8.4–10.5)
Chloride: 94 mEq/L — ABNORMAL LOW (ref 96–112)
Creatinine, Ser: 1.98 mg/dL — ABNORMAL HIGH (ref 0.50–1.35)
GFR calc Af Amer: 35 mL/min — ABNORMAL LOW (ref >90)
GFR, EST NON AFRICAN AMERICAN: 30 mL/min — AB (ref >90)
Glucose, Bld: 110 mg/dL — ABNORMAL HIGH (ref 70–99)
Potassium: 4.2 mEq/L (ref 3.7–5.3)
SODIUM: 133 meq/L — AB (ref 137–147)

## 2013-06-25 LAB — URINALYSIS, ROUTINE W REFLEX MICROSCOPIC
BILIRUBIN URINE: NEGATIVE
GLUCOSE, UA: NEGATIVE mg/dL
HGB URINE DIPSTICK: NEGATIVE
KETONES UR: NEGATIVE mg/dL
Leukocytes, UA: NEGATIVE
Nitrite: NEGATIVE
PROTEIN: 100 mg/dL — AB
Specific Gravity, Urine: 1.019 (ref 1.005–1.030)
UROBILINOGEN UA: 0.2 mg/dL (ref 0.0–1.0)
pH: 5 (ref 5.0–8.0)

## 2013-06-25 LAB — FERRITIN CHCC: FERRITIN: 630 ng/mL — AB (ref 22–316)

## 2013-06-25 LAB — IRON AND TIBC CHCC
%SAT: 12 % — ABNORMAL LOW (ref 20–55)
Iron: 15 ug/dL — ABNORMAL LOW (ref 42–163)
TIBC: 125 ug/dL — ABNORMAL LOW (ref 202–409)
UIBC: 110 ug/dL — ABNORMAL LOW (ref 117–376)

## 2013-06-25 LAB — I-STAT TROPONIN, ED: TROPONIN I, POC: 0.06 ng/mL (ref 0.00–0.08)

## 2013-06-25 LAB — PRO B NATRIURETIC PEPTIDE: Pro B Natriuretic peptide (BNP): 6196 pg/mL — ABNORMAL HIGH (ref 0–450)

## 2013-06-25 MED ORDER — SODIUM CHLORIDE 0.9 % IJ SOLN
3.0000 mL | Freq: Two times a day (BID) | INTRAMUSCULAR | Status: DC
  Administered 2013-06-26 – 2013-06-27 (×3): 3 mL via INTRAVENOUS

## 2013-06-25 MED ORDER — SENNOSIDES-DOCUSATE SODIUM 8.6-50 MG PO TABS
1.0000 | ORAL_TABLET | Freq: Every evening | ORAL | Status: DC | PRN
  Filled 2013-06-25: qty 1

## 2013-06-25 MED ORDER — IPRATROPIUM BROMIDE 0.02 % IN SOLN
0.5000 mg | Freq: Once | RESPIRATORY_TRACT | Status: AC
  Administered 2013-06-25: 0.5 mg via RESPIRATORY_TRACT
  Filled 2013-06-25: qty 2.5

## 2013-06-25 MED ORDER — TAMSULOSIN HCL 0.4 MG PO CAPS
0.4000 mg | ORAL_CAPSULE | Freq: Two times a day (BID) | ORAL | Status: DC
  Administered 2013-06-26 – 2013-06-27 (×3): 0.4 mg via ORAL
  Filled 2013-06-25 (×4): qty 1

## 2013-06-25 MED ORDER — NITROGLYCERIN 0.4 MG SL SUBL: 0.4000 mg | SUBLINGUAL_TABLET | SUBLINGUAL | Status: DC | PRN

## 2013-06-25 MED ORDER — ALPRAZOLAM 0.25 MG PO TABS
0.2500 mg | ORAL_TABLET | Freq: Every day | ORAL | Status: DC
Start: 2013-06-26 — End: 2013-06-27
  Filled 2013-06-25 (×2): qty 1

## 2013-06-25 MED ORDER — BISACODYL 5 MG PO TBEC: 5.0000 mg | DELAYED_RELEASE_TABLET | Freq: Every day | ORAL | Status: DC | PRN

## 2013-06-25 MED ORDER — FUROSEMIDE 10 MG/ML IJ SOLN
40.0000 mg | Freq: Once | INTRAMUSCULAR | Status: AC
  Administered 2013-06-25: 40 mg via INTRAVENOUS
  Filled 2013-06-25: qty 4

## 2013-06-25 MED ORDER — ALBUTEROL (5 MG/ML) CONTINUOUS INHALATION SOLN
10.0000 mg/h | INHALATION_SOLUTION | Freq: Once | RESPIRATORY_TRACT | Status: AC
  Administered 2013-06-25: 10 mg/h via RESPIRATORY_TRACT
  Filled 2013-06-25: qty 40

## 2013-06-25 MED ORDER — HYDROCODONE-ACETAMINOPHEN 5-325 MG PO TABS
1.0000 | ORAL_TABLET | Freq: Two times a day (BID) | ORAL | Status: DC
  Administered 2013-06-26 – 2013-06-27 (×3): 1 via ORAL
  Filled 2013-06-25 (×3): qty 1

## 2013-06-25 MED ORDER — LOSARTAN POTASSIUM 50 MG PO TABS
50.0000 mg | ORAL_TABLET | Freq: Two times a day (BID) | ORAL | Status: DC
  Administered 2013-06-26 – 2013-06-27 (×3): 50 mg via ORAL
  Filled 2013-06-25 (×4): qty 1

## 2013-06-25 MED ORDER — SODIUM CHLORIDE 0.9 % IV SOLN
INTRAVENOUS | Status: DC
Start: 2013-06-26 — End: 2013-06-26
  Administered 2013-06-26: via INTRAVENOUS

## 2013-06-25 MED ORDER — METHYLPREDNISOLONE SODIUM SUCC 125 MG IJ SOLR
125.0000 mg | Freq: Once | INTRAMUSCULAR | Status: AC
  Administered 2013-06-25: 125 mg via INTRAVENOUS
  Filled 2013-06-25: qty 2

## 2013-06-25 MED ORDER — IPRATROPIUM-ALBUTEROL 0.5-2.5 (3) MG/3ML IN SOLN
3.0000 mL | RESPIRATORY_TRACT | Status: DC | PRN
  Administered 2013-06-26: 3 mL via RESPIRATORY_TRACT

## 2013-06-25 MED ORDER — SODIUM CHLORIDE 0.9 % IV SOLN
Freq: Once | INTRAVENOUS | Status: AC
  Administered 2013-06-25: 19:00:00 via INTRAVENOUS

## 2013-06-25 MED ORDER — PREDNISONE 20 MG PO TABS
40.0000 mg | ORAL_TABLET | Freq: Every day | ORAL | Status: DC
  Administered 2013-06-26 – 2013-06-27 (×2): 40 mg via ORAL
  Filled 2013-06-25 (×3): qty 2

## 2013-06-25 MED ORDER — ACETAMINOPHEN 650 MG RE SUPP: 650.0000 mg | Freq: Four times a day (QID) | RECTAL | Status: DC | PRN

## 2013-06-25 MED ORDER — IPRATROPIUM-ALBUTEROL 0.5-2.5 (3) MG/3ML IN SOLN
3.0000 mL | RESPIRATORY_TRACT | Status: DC
  Administered 2013-06-26: 3 mL via RESPIRATORY_TRACT
  Filled 2013-06-25: qty 3

## 2013-06-25 MED ORDER — CARVEDILOL 25 MG PO TABS
25.0000 mg | ORAL_TABLET | Freq: Two times a day (BID) | ORAL | Status: DC
  Administered 2013-06-26 – 2013-06-27 (×3): 25 mg via ORAL
  Filled 2013-06-25 (×5): qty 1

## 2013-06-25 MED ORDER — LEVOFLOXACIN IN D5W 500 MG/100ML IV SOLN
500.0000 mg | Freq: Once | INTRAVENOUS | Status: AC
  Administered 2013-06-25: 500 mg via INTRAVENOUS
  Filled 2013-06-25: qty 100

## 2013-06-25 MED ORDER — ACETAMINOPHEN 325 MG PO TABS: 650.0000 mg | ORAL_TABLET | Freq: Four times a day (QID) | ORAL | Status: DC | PRN

## 2013-06-25 MED ORDER — PANTOPRAZOLE SODIUM 40 MG PO TBEC
40.0000 mg | DELAYED_RELEASE_TABLET | Freq: Every day | ORAL | Status: DC
  Administered 2013-06-26 – 2013-06-27 (×2): 40 mg via ORAL
  Filled 2013-06-25 (×2): qty 1

## 2013-06-25 MED ORDER — POTASSIUM CHLORIDE CRYS ER 10 MEQ PO TBCR
10.0000 meq | EXTENDED_RELEASE_TABLET | Freq: Every day | ORAL | Status: DC
  Administered 2013-06-26 – 2013-06-27 (×2): 10 meq via ORAL
  Filled 2013-06-25 (×2): qty 1

## 2013-06-25 MED ORDER — LEVOFLOXACIN 750 MG PO TABS
750.0000 mg | ORAL_TABLET | ORAL | Status: DC
  Filled 2013-06-25: qty 1

## 2013-06-25 MED ORDER — RIVAROXABAN 20 MG PO TABS: 20.0000 mg | ORAL_TABLET | Freq: Every day | ORAL | Status: DC

## 2013-06-25 NOTE — ED Notes (Signed)
Admitting physician at bedside

## 2013-06-25 NOTE — Telephone Encounter (Signed)
Left pt message to call and schedule appointment °

## 2013-06-25 NOTE — ED Notes (Signed)
MD student at bedside

## 2013-06-25 NOTE — Progress Notes (Deleted)
Chief Complaint:  Chief Complaint  Patient presents with  . Shortness of Breath    HPI: Andrew [name] is a 80 y.o. male with PMH oxygen dependent COPD, Atrial FIb on xarelto ( was previously on amiodarone but had to be dc due to elevated liver enzymes) , ischemic cardiomyopathy, CHF  with  EF 35% last echo. He is here for cough and SOB since Friday, was feeling worse by Sunday and then today was supposed to get labs for Dr Twanna Hy who is treating him for anemia of chronic disease and he was having problems breathing and so came here. He has been using all his COPD meds regular, nebs every 6 hrs. He denies CP. He has had wheezing. HE usually take supplemental at home at night 2L and O2 runs about 93%. IN the clinic today he is 89% on 2L. No recent hospitalizations, no recent prednisone use, no recent abx use. Denies weight gain or leg swelling. Poor PO intake.   Study Conclusions TEE on 05/2012  - Left ventricle: Systolic function was moderately to severely reduced. The estimated ejection fraction was in the range of 30% to 35%. - Aortic valve: No evidence of vegetation. - Mitral valve: No evidence of vegetation. - Left atrium: No evidence of thrombus in the atrial cavity or appendage. - Tricuspid valve: No evidence of vegetation. - Pulmonic valve: No evidence of vegetation. - Line: A venous pacing wire was visualized in the right atrial cavity and right ventricular cavity, with its tip at the RV apex. Impressions:  - The 3 pacing wires were seen fairly well. There were no vegetations seen on the pacing wires. No TEE evidence of endocarditis seen. Transesophageal echocardiography. 2D and color Doppler. Height: Height: 182.9cm. Height: 72in. Weight: Weight: 85.5kg. Weight: 188lb. Body mass index: BMI: 25.6kg/m^2. Body surface area: BSA: 2.41m^2. Blood pressure: 148/56. Patient status: Inpatient. Location: Endoscopy.     Past Medical History  Diagnosis Date  . Chronic  systolic heart failure     a. Chronic LV systolic failure. b. Echo in Dec 2011 showed EF of 25% - f/u echo 02/2011: Mild LVH, EF 15%, posterior lateral akinesis, inferior akinesis, grade 1 diastolic dysfunction, mild LAE. c. s/p BiV pacemaker 06/2011.  [name] Atrial flutter     a. Off amiodarone because of increased LFTs  . COPD (chronic obstructive pulmonary disease)     Chronic SOB, O2 dependent  . Myocardial infarction 1994  . Gastro - esophageal reflux   . Claudication   . BPH (benign prostatic hyperplasia)   . Coronary artery disease     a. Myocardial infarction in 1984,  99 and 07. b. H/o multiple RCA stents. c.  LHC 04/2011: without obstructive disease. oLM 25%, mLAD 30%, pCFX 25%, prox to mid RCA stent ok, dRCA 30% and 70%, pPDA 25%, mPL 25-30%, EF 25%.  [name] Hypertension   . Atrial fibrillation     a. On Xarelto.  . Hemorrhoids   . Hyperlipidemia     statin intolerant 2/2 myalgias  . Hx of adenomatous colonic polyps   . Hx of colonoscopy   . Chronic renal insufficiency   . Biventricular cardiac pacemaker in situ 3.2013  . NSVT (nonsustained ventricular tachycardia)     a. 04/2012- 6 beats on tele.  [name] Shortness of breath   . Arthritis     hips ,back ,hands  . Iron deficiency anemia, unspecified 03/20/2013   Past Surgical History  Procedure Laterality Date  . Cardiac  catheterization  2008  . Cataract extraction    . Salivary gland surgery    . Coronary angioplasty with stent placement    . Insert / replace / remove pacemaker  06/16/2011  . Tee without cardioversion  05/08/2012    Procedure: TRANSESOPHAGEAL ECHOCARDIOGRAM (TEE);  Surgeon: Vesta Mixer, MD;  Location: [facility] ENDOSCOPY;  Service: Cardiovascular;  Laterality: N/A;   History   Social History  . Marital Status: Married    Spouse Name: N/A    Number of Children: N/A  . Years of Education: N/A   Occupational History  . retired Designer, industrial/product work    Social History Main Topics  . Smoking status: Former Smoker --  1.50 packs/day for 55 years    Types: Cigarettes    Start date: 05/21/1954    Quit date: 01/03/2009  . Smokeless tobacco: Never Used     Comment: quit 4 years ago  . Alcohol Use: Yes     Comment: occasional  . Drug Use: No  . Sexual Activity: Not Currently   Other Topics Concern  . None   Social History Narrative  . None   Family History  Problem Relation Age of Onset  . Coronary artery disease Father   . Heart disease Mother   . Colon cancer Neg Hx   . Heart disease Brother    Allergies  Allergen Reactions  . Codeine Nausea And Vomiting  . Inspra [Eplerenone] Other (See Comments)    Stomach problems  . Spironolactone Other (See Comments)    Gynecomastia  . Doxycycline Itching and Rash  . Sulfonamide Derivatives Itching and Rash   Prior to Admission medications   Medication Sig Start Date End Date Taking? Authorizing Provider  albuterol (PROVENTIL) (2.5 MG/3ML) 0.083% nebulizer solution Take 3 mLs (2.5 mg total) by nebulization every 6 (six) hours as needed for wheezing. 10/02/12  Yes Eleanore Delia Chimes, PA-C  ALPRAZolam (XANAX) 0.25 MG tablet Take 0.25 mg by mouth daily as needed. For anxiety. 06/23/10  Yes Roger Shelter, MD  carvedilol (COREG) 25 MG tablet TAKE 1 TABLET TWICE A DAY WITH A MEAL 01/02/13  Yes Rollene Rotunda, MD  esomeprazole (NEXIUM) 40 MG capsule Take 1 capsule (40 mg total) by mouth 2 (two) times daily. PATIENT NEEDS OFFICE VISIT FOR ADDITIONAL REFILLS 11/13/12  Yes [name], MD  Fluticasone-Salmeterol (ADVAIR DISKUS) 250-50 MCG/DOSE AEPB Inhale 1 puff into the lungs 2 (two) times daily. 09/25/12  Yes Godfrey Pick, PA-C  furosemide (LASIX) 40 MG tablet TAKE AS DIRECTED 05/11/13  Yes Rollene Rotunda, MD  guaiFENesin (MUCINEX) 600 MG 12 hr tablet Take 1,200 mg by mouth 2 (two) times daily.   Yes Historical Provider, MD  hydrALAZINE (APRESOLINE) 25 MG tablet 25 mg 3 (three) times daily. Prn 09/25/12  Yes Rollene Rotunda, MD  HYDROcodone-acetaminophen  (NORCO/VICODIN) 5-325 MG per tablet Take 1 tablet by mouth 2 (two) times daily. Arthritis pain   Yes Historical Provider, MD  ipratropium (ATROVENT) 0.02 % nebulizer solution Take 2.5 mLs (500 mcg total) by nebulization 4 (four) times daily. 10/02/12  Yes Eleanore E Debbra Riding, PA-C  losartan (COZAAR) 50 MG tablet TAKE 1 TABLET TWICE A DAY 01/12/13  Yes Rollene Rotunda, MD  NITROSTAT 0.4 MG SL tablet PLACE 1 TABLET (0.4 MG TOTAL) UNDER THE TONGUE EVERY 5 (FIVE) MINUTES AS NEEDED FOR CHEST PAIN. 07/28/12  Yes Beatrice Lecher, PA-C  potassium chloride (KLOR-CON M10) 10 MEQ tablet Take 10 mEq by mouth daily. Every other day 07/11/12  Yes Rollene Rotunda, MD  PROAIR HFA 108 [phone] BASE) MCG/ACT inhaler INHALE 1 TO 2 PUFFS EVERY 6 HOURS AS NEEDED 09/25/12  Yes Eleanore Delia Chimes, PA-C  Rivaroxaban (XARELTO) 20 MG TABS tablet TAKE 1 TABLET DAILY 04/02/13  Yes Rollene Rotunda, MD  Tamsulosin HCl (FLOMAX) 0.4 MG CAPS Take 0.4 mg by mouth 2 (two) times daily.    Yes Historical Provider, MD  fluticasone (FLONASE) 50 MCG/ACT nasal spray Place 2 sprays into the nose as needed. 06/19/12 06/19/13  Eleanore Delia Chimes, PA-C     ROS: The patient denies chills, night sweats, unintentional weight loss, chest pain, palpitations,  nausea, vomiting, abdominal pain, dysuria, hematuria, melena, numbness, weakness, or tingling. + wheezing, dyspnea on exertion,subjective fevers.   All other systems have been reviewed and were otherwise negative with the exception of those mentioned in the HPI and as above.    PHYSICAL EXAM: Filed Vitals:   06/25/13 1507  BP: 112/65  Pulse: 91  Temp: 98.3 F (36.8 C)  Resp: 20   Filed Vitals:   06/25/13 1507  Weight: 185 lb (83.915 kg)   Body mass index is 26.16 kg/(m^2).  General: Alert, thin white male, mild acute distress. MInimal increase WOB  HEENT:  Normocephalic, atraumatic, oropharynx patent. EOMI, PERRLA Cardiovascular:  no rubs + murmurs  No gallops.  radial pulse intact. No pedal edema.    Respiratory: Clear to auscultation bilaterally.  + min wheezes, no  rales, +rhonchi.   GI: No organomegaly, abdomen is soft and non-tender, positive bowel sounds.  No masses. Skin: No rashes. Neurologic: Facial musculature symmetric. Psychiatric: Patient is appropriate throughout our interaction. Lymphatic: No cervical lymphadenopathy Musculoskeletal: Gait in wheelchair, he was unable to walk with ocygen, he is able to do that at home but not well but we tried to walk him in the office and he was not able to do it, he was fatigued just getting up and down from wheel chair   LABS: Results for orders placed in visit on 06/25/13  IRON AND TIBC [facility]      Result Value Ref Range   Iron 15 (*) 42 - 163 ug/dL   TIBC 109 (*) 604 - 540 ug/dL   UIBC 981 (*) 191 - 478 ug/dL   %SAT 12 (*) 20 - 55 %  FERRITIN [facility]      Result Value Ref Range   Ferritin 630 (*) 22 - 316 ng/ml  CBC & DIFF AND RETIC      Result Value Ref Range   WBC 19.1 (*) 4.0 - 10.3 10e3/uL   NEUT# 18.0 (*) 1.5 - 6.5 10e3/uL   HGB 11.1 (*) 13.0 - 17.1 g/dL   HCT 29.5 (*) 62.1 - 30.8 %   Platelets 230  140 - 400 10e3/uL   MCV 94.6  79.3 - 98.0 fL   MCH 31.5  27.2 - 33.4 pg   MCHC 33.3  32.0 - 36.0 g/dL   RBC 6.57 (*) 8.46 - 9.62 10e6/uL   RDW 14.3  11.0 - 14.6 %   lymph# 0.5 (*) 0.9 - 3.3 10e3/uL   MONO# 0.6  0.1 - 0.9 10e3/uL   Eosinophils Absolute 0.0  0.0 - 0.5 10e3/uL   Basophils Absolute 0.0  0.0 - 0.1 10e3/uL   NEUT% 93.8 (*) 39.0 - 75.0 %   LYMPH% 2.8 (*) 14.0 - 49.0 %   MONO% 3.2  0.0 - 14.0 %   EOS% 0.1  0.0 - 7.0 %   BASO% 0.1  0.0 - 2.0 %   Retic % 1.14  0.80 - 1.80 %   Retic Ct Abs 40.13  34.80 - 93.90 10e3/uL   Immature Retic Fract 5.30  3.00 - 10.60 %     EKG/XRAY:   Primary read [facility] ? pulmonary fibrosis vs increase density in Left perihilar Unchange in RLL No effusion ,    ASSESSMENT/PLAN: Encounter Diagnoses  Name Primary?  . SOB (shortness of breath)   . COPD  (chronic obstructive pulmonary disease)   . Hypoxia   . Leukocytosis, unspecified Yes  . Anorexia     80 y/o gentleman with COPD on supplemental 2L oxygen at night only, CHF EF 30-35%, afib on xarelto, anemia with iron infusion  with worsening SOB and WBC of 19 on today's blood work for his anemia.  He has had severe increase WOB, unable to walk or get up without SOB. He has had subjective fevers, low grade for him, poor po and fluid intake. He has been taking his nebulizer treatments every 6 hours without relief. Due to his medical comorbidities needs [facility] eval and [facility] .   O2 sat on 2L upon initial eval was 89% at rest.. Baseline is 93%.  Patient and wife declined to go via ambulance. Risk and benefits expalined. They will go by private vehicle to ER with his supplemental o2.  [facility] let [facility] know he is coming for further workup of hypoxia, leukocytosis of unknown etiology ( no recent steroids, failed outpatient nebs q6 hrs at home and worsening SOB, no urinary sxs)  I did not do blood work since got it done prior to coming, WBC was 19, one month ago was 5.6 F/u prn   Gross sideeffects, risk and benefits, and alternatives of medications d/w patient. Patient is aware that all medications have potential sideeffects and we are unable to predict every sideeffect or drug-drug interaction that may occur.  Hamilton CapriLE, THAO PHUONG, DO 06/25/2013 4:12 PM

## 2013-06-25 NOTE — Progress Notes (Signed)
Chief Complaint:  Chief Complaint  Patient presents with  . Shortness of Breath    HPI: Andrew [name] is a 80 y.o. male with PMH oxygen dependent COPD, Atrial FIb on xarelto ( was previously on amiodarone but had to be dc due to elevated liver enzymes) , ischemic cardiomyopathy, CHF  with  EF 35% last echo of who is here for cough and SOB since Friday, was feeling worse by Sunday and then today was supposed to get labs for Dr Twanna HyEnever who is treating him for anemai of chronic disease and he was having problems breathing and just felt generalized weakness, thought he might have PNA. He denies CP. He has had wheezing. HE usually take supplemental at home at night 2L and runs about 93%. IN the clinic today he is 89% on 2L. No recent hospitalizations, no recent prednisone use, no recent abx use. He has been taking his nebs q6hrs and has been on his COPD meds as scheduled. No releif with those meds.   Study Conclusions TEE on 05/2012  - Left ventricle: Systolic function was moderately to severely reduced. The estimated ejection fraction was in the range of 30% to 35%. - Aortic valve: No evidence of vegetation. - Mitral valve: No evidence of vegetation. - Left atrium: No evidence of thrombus in the atrial cavity or appendage. - Tricuspid valve: No evidence of vegetation. - Pulmonic valve: No evidence of vegetation. - Line: A venous pacing wire was visualized in the right atrial cavity and right ventricular cavity, with its tip at the RV apex. Impressions:  - The 3 pacing wires were seen fairly well. There were no vegetations seen on the pacing wires. No TEE evidence of endocarditis seen. Transesophageal echocardiography. 2D and color Doppler. Height: Height: 182.9cm. Height: 72in. Weight: Weight: 85.5kg. Weight: 188lb. Body mass index: BMI: 25.6kg/m^2. Body surface area: BSA: 2.421m^2. Blood pressure: 148/56. Patient status: Inpatient. Location: Endoscopy.     Past Medical  History  Diagnosis Date  . Chronic systolic heart failure     a. Chronic LV systolic failure. b. Echo in Dec 2011 showed EF of 25% - f/u echo 02/2011: Mild LVH, EF 15%, posterior lateral akinesis, inferior akinesis, grade 1 diastolic dysfunction, mild LAE. c. s/p BiV pacemaker 06/2011.  [name]. Atrial flutter     a. Off amiodarone because of increased LFTs  . COPD (chronic obstructive pulmonary disease)     Chronic SOB, O2 dependent  . Myocardial infarction 1994  . Gastro - esophageal reflux   . Claudication   . BPH (benign prostatic hyperplasia)   . Coronary artery disease     a. Myocardial infarction in 1984,  99 and 07. b. H/o multiple RCA stents. c.  LHC 04/2011: without obstructive disease. oLM 25%, mLAD 30%, pCFX 25%, prox to mid RCA stent ok, dRCA 30% and 70%, pPDA 25%, mPL 25-30%, EF 25%.  [name]. Hypertension   . Atrial fibrillation     a. On Xarelto.  . Hemorrhoids   . Hyperlipidemia     statin intolerant 2/2 myalgias  . Hx of adenomatous colonic polyps   . Hx of colonoscopy   . Chronic renal insufficiency   . Biventricular cardiac pacemaker in situ 3.2013  . NSVT (nonsustained ventricular tachycardia)     a. 04/2012- 6 beats on tele.  [name]. Shortness of breath   . Arthritis     hips ,back ,hands  . Iron deficiency anemia, unspecified 03/20/2013   Past Surgical History  Procedure  Laterality Date  . Cardiac catheterization  2008  . Cataract extraction    . Salivary gland surgery    . Coronary angioplasty with stent placement    . Insert / replace / remove pacemaker  06/16/2011  . Tee without cardioversion  05/08/2012    Procedure: TRANSESOPHAGEAL ECHOCARDIOGRAM (TEE);  Surgeon: Vesta Mixer, MD;  Location: [facility] ENDOSCOPY;  Service: Cardiovascular;  Laterality: N/A;   History   Social History  . Marital Status: Married    Spouse Name: N/A    Number of Children: N/A  . Years of Education: N/A   Occupational History  . retired Designer, industrial/product work    Social History Main Topics  .  Smoking status: Former Smoker -- 1.50 packs/day for 55 years    Types: Cigarettes    Start date: 05/21/1954    Quit date: 01/03/2009  . Smokeless tobacco: Never Used     Comment: quit 4 years ago  . Alcohol Use: Yes     Comment: occasional  . Drug Use: No  . Sexual Activity: Not Currently   Other Topics Concern  . None   Social History Narrative  . None   Family History  Problem Relation Age of Onset  . Coronary artery disease Father   . Heart disease Mother   . Colon cancer Neg Hx   . Heart disease Brother    Allergies  Allergen Reactions  . Codeine Nausea And Vomiting  . Inspra [Eplerenone] Other (See Comments)    Stomach problems  . Spironolactone Other (See Comments)    Gynecomastia  . Doxycycline Itching and Rash  . Sulfonamide Derivatives Itching and Rash   Prior to Admission medications   Medication Sig Start Date End Date Taking? Authorizing Provider  albuterol (PROVENTIL) (2.5 MG/3ML) 0.083% nebulizer solution Take 3 mLs (2.5 mg total) by nebulization every 6 (six) hours as needed for wheezing. 10/02/12  Yes Eleanore Delia Chimes, PA-C  ALPRAZolam (XANAX) 0.25 MG tablet Take 0.25 mg by mouth daily as needed. For anxiety. 06/23/10  Yes Roger Shelter, MD  carvedilol (COREG) 25 MG tablet TAKE 1 TABLET TWICE A DAY WITH A MEAL 01/02/13  Yes Rollene Rotunda, MD  esomeprazole (NEXIUM) 40 MG capsule Take 1 capsule (40 mg total) by mouth 2 (two) times daily. PATIENT NEEDS OFFICE VISIT FOR ADDITIONAL REFILLS 11/13/12  Yes [name], MD  Fluticasone-Salmeterol (ADVAIR DISKUS) 250-50 MCG/DOSE AEPB Inhale 1 puff into the lungs 2 (two) times daily. 09/25/12  Yes Godfrey Pick, PA-C  furosemide (LASIX) 40 MG tablet TAKE AS DIRECTED 05/11/13  Yes Rollene Rotunda, MD  guaiFENesin (MUCINEX) 600 MG 12 hr tablet Take 1,200 mg by mouth 2 (two) times daily.   Yes Historical Provider, MD  hydrALAZINE (APRESOLINE) 25 MG tablet 25 mg 3 (three) times daily. Prn 09/25/12  Yes Rollene Rotunda, MD    HYDROcodone-acetaminophen (NORCO/VICODIN) 5-325 MG per tablet Take 1 tablet by mouth 2 (two) times daily. Arthritis pain   Yes Historical Provider, MD  ipratropium (ATROVENT) 0.02 % nebulizer solution Take 2.5 mLs (500 mcg total) by nebulization 4 (four) times daily. 10/02/12  Yes Eleanore E Debbra Riding, PA-C  losartan (COZAAR) 50 MG tablet TAKE 1 TABLET TWICE A DAY 01/12/13  Yes Rollene Rotunda, MD  NITROSTAT 0.4 MG SL tablet PLACE 1 TABLET (0.4 MG TOTAL) UNDER THE TONGUE EVERY 5 (FIVE) MINUTES AS NEEDED FOR CHEST PAIN. 07/28/12  Yes Beatrice Lecher, PA-C  potassium chloride (KLOR-CON M10) 10 MEQ tablet Take 10 mEq by  mouth daily. Every other day 07/11/12  Yes Rollene Rotunda, MD  PROAIR HFA 108 [phone] BASE) MCG/ACT inhaler INHALE 1 TO 2 PUFFS EVERY 6 HOURS AS NEEDED 09/25/12  Yes Eleanore Delia Chimes, PA-C  Rivaroxaban (XARELTO) 20 MG TABS tablet TAKE 1 TABLET DAILY 04/02/13  Yes Rollene Rotunda, MD  Tamsulosin HCl (FLOMAX) 0.4 MG CAPS Take 0.4 mg by mouth 2 (two) times daily.    Yes Historical Provider, MD  fluticasone (FLONASE) 50 MCG/ACT nasal spray Place 2 sprays into the nose as needed. 06/19/12 06/19/13  Eleanore Delia Chimes, PA-C     ROS: The patient denies chills, night sweats, unintentional weight loss, chest pain, palpitations,  nausea, vomiting, abdominal pain, dysuria, hematuria, melena, numbness, weakness, or tingling. + wheezing, dyspnea on exertion,subjective fevers.   All other systems have been reviewed and were otherwise negative with the exception of those mentioned in the HPI and as above.    PHYSICAL EXAM: Filed Vitals:   06/25/13 1507  BP: 112/65  Pulse: 91  Temp: 98.3 F (36.8 C)  Resp: 20   Filed Vitals:   06/25/13 1507  Weight: 185 lb (83.915 kg)   Body mass index is 26.16 kg/(m^2).  General: Alert, thin , mild acute distress. MInimal increase WOB  HEENT:  Normocephalic, atraumatic, oropharynx patent. EOMI, PERRLA Cardiovascular:  Regular rate and rhythm, no rubs + murmurs or +gallops.   Radial pulse intact. No pedal edema.  Respiratory: Clear to auscultation bilaterally.  + minimal wheezes, + rhonchi.   GI: No organomegaly, abdomen is soft and non-tender, positive bowel sounds.  No masses. Skin: No rashes. Neurologic: Facial musculature symmetric. Psychiatric: Patient is appropriate throughout our interaction. Lymphatic: No cervical lymphadenopathy Musculoskeletal: In wheelchair, increase WOB with getting up and down from wheelchair. Was unable to walk for me for short distance without fatigue. He was desating as he was walking.    LABS: Results for orders placed in visit on 06/25/13  IRON AND TIBC [facility]      Result Value Ref Range   Iron 15 (*) 42 - 163 ug/dL   TIBC 109 (*) 604 - 540 ug/dL   UIBC 981 (*) 191 - 478 ug/dL   %SAT 12 (*) 20 - 55 %  FERRITIN [facility]      Result Value Ref Range   Ferritin 630 (*) 22 - 316 ng/ml  CBC & DIFF AND RETIC      Result Value Ref Range   WBC 19.1 (*) 4.0 - 10.3 10e3/uL   NEUT# 18.0 (*) 1.5 - 6.5 10e3/uL   HGB 11.1 (*) 13.0 - 17.1 g/dL   HCT 29.5 (*) 62.1 - 30.8 %   Platelets 230  140 - 400 10e3/uL   MCV 94.6  79.3 - 98.0 fL   MCH 31.5  27.2 - 33.4 pg   MCHC 33.3  32.0 - 36.0 g/dL   RBC 6.57 (*) 8.46 - 9.62 10e6/uL   RDW 14.3  11.0 - 14.6 %   lymph# 0.5 (*) 0.9 - 3.3 10e3/uL   MONO# 0.6  0.1 - 0.9 10e3/uL   Eosinophils Absolute 0.0  0.0 - 0.5 10e3/uL   Basophils Absolute 0.0  0.0 - 0.1 10e3/uL   NEUT% 93.8 (*) 39.0 - 75.0 %   LYMPH% 2.8 (*) 14.0 - 49.0 %   MONO% 3.2  0.0 - 14.0 %   EOS% 0.1  0.0 - 7.0 %   BASO% 0.1  0.0 - 2.0 %   Retic % 1.14  0.80 -  1.80 %   Retic Ct Abs 40.13  34.80 - 93.90 10e3/uL   Immature Retic Fract 5.30  3.00 - 10.60 %     EKG/XRAY:   Primary read interpreted by Dr. Conley Rolls at [facility]. ? pulmonary fibrosis vs increase density in Left perihilar Unchange in RLL No effusion ,    ASSESSMENT/PLAN: Encounter Diagnoses  Name Primary?  . SOB (shortness of breath)   . COPD (chronic obstructive  pulmonary disease)   . Hypoxia   . Leukocytosis, unspecified Yes  . Anorexia     80 y/o gentleman with COPD on supplemental 2L oxygen at night only, CHF EF 30-35%, afib on xarelto, anemia with iron infusion  with worsening SOB and WBC of 19 on today's blood work for his anemia. He has had severe increase WOB, unable to walk or get up without SOB. He has had subjective fevers, low grade for him, poor po and fluid intake. He has been taking his nebulizer treatments every 6 hours without relief. . Due to his medical comorbidities needs further eval and treatment at [facility] .  O2 sat on 2L upon initial eval was 89% at rest. PAtient and wife declined to go via ambulance. Risk and benefits expalined. They will go by private vehicle to ER with his supplemental o2.  Called ER to let them know he is coming for further workup of hypoxia, leukocytosis of unknown etiology ( no recent steroids, failed outpatient COPD meds and  nebs q6 hrs at home and worsening SOB)  I did not do blood work since got it done prior to coming, WBC was 19, one month ago was 5.6 F/u prn   Gross sideeffects, risk and benefits, and alternatives of medications d/w patient. Patient is aware that all medications have potential sideeffects and we are unable to predict every sideeffect or drug-drug interaction that may occur.  Teandra Harlan PHUONG, DO 06/25/2013 4:12 PM   D/w patient XRay results and reason fro sending to Er for further eval.  FINDINGS:  Left pacer remains in place, unchanged. There is hyperinflation of  the lungs compatible with COPD. Biapical and bibasilar scarring.  Mild interstitial prominence, likely chronic interstitial lung  disease/fibrosis. No confluent airspace opacities or effusions.  IMPRESSION:  COPD/chronic changes. No active disease.

## 2013-06-25 NOTE — H&P (Signed)
Family Medicine Teaching [facility] Admission History and Physical Service Pager: [phone]  Patient name: Andrew [name] Medical record number: [id number] Date of birth: 1930/12/27 Age: 80 y.o. Gender: male  Primary Care Provider: Lucilla Edin, MD Consultants: none Code Status: DNR/DNI (discussed with patient and his wife upon admission)   Chief Complaint: dyspnea  Assessment and Plan: THIMOTHY BARRETTA is a 80 y.o. male presenting with dyspnea . PMH is significant for oxygen dependent COPD, paroxysmal Atrial FIb on xarelto/a-flutter (was previously on amiodarone but had to be dc due to elevated liver enzymes) , ischemic cardiomyopathy, CHF with EF 35% last echo with biventricular pacer, CAD s/p multiple MI (multiple stenting, remote hx of CABG).   #Dyspnea/COPD vs HFrEF exacerbation: most likely multifactorial, history seems most consistent with COPD exacerbation.  He notes that his belly feeling fuller and his pants were tight today. Weight upon admission of 185 and says his dry weight is 182 lbs. He appears to be slightly dehydrated on admission. He doesn't have any signs on exam of fluid overload and CXR is without evidence. His only indication is BNP. He is elevated from his baseline O2 of 2L QHS.  Increased cough with more production.  He was using 3L during the day yesterday and today with no relief.  Troponin negative x 1 in ED. S/p 40 mg IV lasix in ED.    - admitted to telemtry, Dr. McDiarmid attending  - prednisone 40 mg daily  - levaquin 750 mg Q48 based on CCr.  - Duonebs Q4, Q2 PRN  - holding home lasix 40 mg; may restart tomorrow after gentle hydration  - NS 50 mL/hr for 12 hours; as he appeared dehydrated on exam  - holding ECHO for now as he doesn't appear fluid overloaded.  - Daily weights  - Strict i/o's  - BMP AM   #Leukocytosis: could be related to a stress response as the patient was well appearing on exam. Afebrile and no signs of infection. Has received  steroids in the ED and will be started on prednisone so going forward this lab will be falsley elevated. Will need to base the idea of infection on his clinical exam and any fevers.  - CBC am  - UA: pending.   #HTN: Bp fluctuations between 112-158/65-66.  Losartan and Coreg and hydralazine TID as needed  - Losartan 50 mg BID  - Coreg 25 mg BID  - holding hydralazine as to determine what the as needed detail is   #Iron def. Anemia: (Hx of adenomatous colonic polyps): followed by Dr. Myna Hidalgo.  Receiving IV iron and feraheme and has an erythropoietin def but not receiving Aranesp based on review of his chart. F/u every 2 months.   #Paroxysmal AFib on xarelto/A flutter: s/p biventricular pacer. Controlled.  - Telemetry  - Xarelto 15 mg instead of home dose 20 mg based on CCr.  #BPH: stable and continued on home flomax   #GERD: stable and given protonix based on formulary   FEN/GI: Heart healthy diet/ NS 50 mL/hr  Prophylaxis: on xarelto   Disposition: admitted to the family medicine teaching service for dyspnea, Dr. McDiarmid attending   History of Present Illness: Andrew [name] is a 80 y.o. male presenting with dyspnea. This started on Thursday or Friday. He was having a cough at night and felt like he had something in his throat that he couldn't get up.  His dyspnea worsened to where he couldn't walk more than a few feet without  becoming severely short of breath.  He was taking his breathing treatments to no relief.  He was not able to see Dr. Myna Hidalgo today but he was able to get his labs drawn at [facility]. Based on his results, his WBC 19, his wife decided they needed to be seen at [facility]. He was severely short of breath at [facility] and based on his lab results, they directed he be seen at the [facility] ED.   He reports no swelling in his legs. He does report that his belly feels fuller and his pants felt tighter today.  His dry weight is 182 lbs.  He had a loose stool today and is having  flatus. He denies any fevers.  Reports increase in productive cough the last day or so.  ED course: Received a dose 40 mg IV lasix, methylprednisolone, and NS 150 mL/hr.    Review Of Systems: Per HPI with the following additions: See HPI  Otherwise 12 point review of systems was performed and was unremarkable.  Patient Active Problem List   Diagnosis Date Noted  . Iron deficiency anemia, unspecified 03/20/2013  . S/P biventricular cardiac pacemaker-[employer] 06/17/2011  . Atrial fibrillation 04/06/2011  . Coronary atherosclerosis of native coronary artery 01/15/2011  . Hyperlipidemia 11/26/2010  . Paroxysmal ventricular tachycardia 11/05/2010  . Ischemic cardiomyopathy 10/14/2010  . Atrial flutter   . Chronic systolic heart failure   . Hypertension   . Nonspecific (abnormal) findings on radiological and other examination of body structure 01/12/2010  . ABNORMAL LUNG XRAY 01/12/2010  . DYSPNEA 12/02/2009  . LIVER FUNCTION TESTS, ABNORMAL, HX OF 12/02/2009  . UNSPECIFIED ANEMIA 05/05/2009  . GERD 04/04/2007  . PERSONAL HISTORY OF COLONIC POLYPS 04/04/2007   Past Medical History: Past Medical History  Diagnosis Date  . Chronic systolic heart failure     a. Chronic LV systolic failure. b. Echo in Dec 2011 showed EF of 25% - f/u echo 02/2011: Mild LVH, EF 15%, posterior lateral akinesis, inferior akinesis, grade 1 diastolic dysfunction, mild LAE. c. s/p BiV pacemaker 06/2011.  [name] Atrial flutter     a. Off amiodarone because of increased LFTs  . COPD (chronic obstructive pulmonary disease)     Chronic SOB, O2 dependent  . Myocardial infarction 1994  . Gastro - esophageal reflux   . Claudication   . BPH (benign prostatic hyperplasia)   . Coronary artery disease     a. Myocardial infarction in 1984,  99 and 07. b. H/o multiple RCA stents. c.  LHC 04/2011: without obstructive disease. oLM 25%, mLAD 30%, pCFX 25%, prox to mid RCA stent ok, dRCA 30% and 70%, pPDA 25%, mPL 25-30%, EF 25%.   [name] Hypertension   . Atrial fibrillation     a. On Xarelto.  . Hemorrhoids   . Hyperlipidemia     statin intolerant 2/2 myalgias  . Hx of adenomatous colonic polyps   . Hx of colonoscopy   . Chronic renal insufficiency   . Biventricular cardiac pacemaker in situ 3.2013  . NSVT (nonsustained ventricular tachycardia)     a. 04/2012- 6 beats on tele.  [name] Shortness of breath   . Arthritis     hips ,back ,hands  . Iron deficiency anemia, unspecified 03/20/2013   Past Surgical History: Past Surgical History  Procedure Laterality Date  . Cardiac catheterization  2008  . Cataract extraction    . Salivary gland surgery    . Coronary angioplasty with stent placement    . Insert /  replace / remove pacemaker  06/16/2011  . Tee without cardioversion  05/08/2012    Procedure: TRANSESOPHAGEAL ECHOCARDIOGRAM (TEE);  Surgeon: Vesta Mixer, MD;  Location: [facility] ENDOSCOPY;  Service: Cardiovascular;  Laterality: N/A;   Social History: History  Substance Use Topics  . Smoking status: Former Smoker -- 1.50 packs/day for 55 years    Types: Cigarettes    Start date: 05/21/1954    Quit date: 01/03/2009  . Smokeless tobacco: Never Used     Comment: quit 4 years ago  . Alcohol Use: No     Comment: occasional   Additional social history: none  Please also refer to relevant sections of EMR.  Family History: Family History  Problem Relation Age of Onset  . Coronary artery disease Father   . Heart disease Mother   . Colon cancer Neg Hx   . Heart disease Brother    Allergies and Medications: Allergies  Allergen Reactions  . Codeine Nausea And Vomiting  . Inspra [Eplerenone] Other (See Comments)    Stomach problems  . Spironolactone Other (See Comments)    Gynecomastia  . Doxycycline Itching and Rash  . Sulfonamide Derivatives Itching and Rash   No current facility-administered medications on file prior to encounter.   Current Outpatient Prescriptions on File Prior to Encounter  Medication  Sig Dispense Refill  . albuterol (PROVENTIL) (2.5 MG/3ML) 0.083% nebulizer solution Take 3 mLs (2.5 mg total) by nebulization every 6 (six) hours as needed for wheezing.  75 mL  12  . ALPRAZolam (XANAX) 0.25 MG tablet Take 0.25 mg by mouth daily as needed. For anxiety.      [name] esomeprazole (NEXIUM) 40 MG capsule Take 1 capsule (40 mg total) by mouth 2 (two) times daily. PATIENT NEEDS OFFICE VISIT FOR ADDITIONAL REFILLS  180 capsule  3  . fluticasone (FLONASE) 50 MCG/ACT nasal spray Place 2 sprays into the nose as needed for allergies.       . Fluticasone-Salmeterol (ADVAIR DISKUS) 250-50 MCG/DOSE AEPB Inhale 1 puff into the lungs 2 (two) times daily.  3 each  3  . hydrALAZINE (APRESOLINE) 25 MG tablet Take 12.5 mg by mouth 3 (three) times daily. Prn      . HYDROcodone-acetaminophen (NORCO/VICODIN) 5-325 MG per tablet Take 1 tablet by mouth 2 (two) times daily. Arthritis pain      . ipratropium (ATROVENT) 0.02 % nebulizer solution Take 2.5 mLs (500 mcg total) by nebulization 4 (four) times daily.  75 mL  12  . potassium chloride (KLOR-CON M10) 10 MEQ tablet Take 10 mEq by mouth daily.       . Tamsulosin HCl (FLOMAX) 0.4 MG CAPS Take 0.4 mg by mouth 2 (two) times daily.       . [DISCONTINUED] metoprolol tartrate (LOPRESSOR) 25 MG tablet Take 1/2 tablet every other day   45 tablet  3    Objective: BP 148/80  Pulse 92  Temp(Src) 97.9 F (36.6 C) (Oral)  Resp 21  Ht 5\' 10"  (1.778 m)  Wt 185 lb (83.915 kg)  BMI 26.54 kg/m2  SpO2 93% Exam: General: NAD, alert, coopeartive with exam, Elderly, Caucasin male, 3L [place]  HEENT: /AT, tacky MM, EOMI  Cardiovascular: RRR, S1S2, no murmurs, rubs or gallops, CR brisk  Respiratory: CTAB, minimal wheezing, no crackles or rhonchi, no extra work of breathing  Abdomen: soft, +BS, NTND, no HSM Extremities: moves all extremities freely, DP and PT pulses +2 b/l; no edema Skin: no rashes  Neuro: alert and oriented, no gross  focal deficits.   Labs and  Imaging: CBC BMET   Recent Labs Lab 06/25/13 1338  WBC 19.1*  HGB 11.1*  HCT 33.3*  PLT 230    Recent Labs Lab 06/25/13 1714  NA 133*  K 4.2  CL 94*  CO2 25  BUN 32*  CREATININE 1.98*  GLUCOSE 110*  CALCIUM 8.8      istat Trop: 0.06   Recent Labs Lab 06/25/13 1714  PROBNP 6196.0*    Myra RudeJeremy E Schmitz, MD 06/25/2013, 9:43 PM PGY-1, [place] [facility] FPTS Intern pager: [phone], text pages welcome  PGY-3 Addendum I have seen and examined this patient.  I agree with the above note with my edits in pink.  Duaine Radin 06/26/2013, 8:11 AM

## 2013-06-25 NOTE — ED Notes (Signed)
PT reports SOB onset Thursday night. He wears 2 L [place] at night normally, but has been wearing it all day today for SOB. PT reports having iron infusions in the past for SOB, but MD was unavailable.

## 2013-06-26 ENCOUNTER — Telehealth: Payer: Self-pay | Admitting: Hematology & Oncology

## 2013-06-26 ENCOUNTER — Encounter: Payer: Self-pay | Admitting: *Deleted

## 2013-06-26 ENCOUNTER — Other Ambulatory Visit: Payer: Self-pay

## 2013-06-26 DIAGNOSIS — J441 Chronic obstructive pulmonary disease with (acute) exacerbation: Principal | ICD-10-CM

## 2013-06-26 DIAGNOSIS — R339 Retention of urine, unspecified: Secondary | ICD-10-CM | POA: Diagnosis present

## 2013-06-26 LAB — CBC
HEMATOCRIT: 32.1 % — AB (ref 39.0–52.0)
HEMOGLOBIN: 11.2 g/dL — AB (ref 13.0–17.0)
MCH: 32.3 pg (ref 26.0–34.0)
MCHC: 34.9 g/dL (ref 30.0–36.0)
MCV: 92.5 fL (ref 78.0–100.0)
Platelets: 233 10*3/uL (ref 150–400)
RBC: 3.47 MIL/uL — ABNORMAL LOW (ref 4.22–5.81)
RDW: 14.2 % (ref 11.5–15.5)
WBC: 11.3 10*3/uL — AB (ref 4.0–10.5)

## 2013-06-26 LAB — BASIC METABOLIC PANEL
BUN: 35 mg/dL — ABNORMAL HIGH (ref 6–23)
CHLORIDE: 97 meq/L (ref 96–112)
CO2: 20 mEq/L (ref 19–32)
Calcium: 8.8 mg/dL (ref 8.4–10.5)
Creatinine, Ser: 1.55 mg/dL — ABNORMAL HIGH (ref 0.50–1.35)
GFR calc non Af Amer: 40 mL/min — ABNORMAL LOW (ref >90)
GFR, EST AFRICAN AMERICAN: 47 mL/min — AB (ref >90)
Glucose, Bld: 171 mg/dL — ABNORMAL HIGH (ref 70–99)
POTASSIUM: 3.7 meq/L (ref 3.7–5.3)
SODIUM: 133 meq/L — AB (ref 137–147)

## 2013-06-26 MED ORDER — IPRATROPIUM-ALBUTEROL 0.5-2.5 (3) MG/3ML IN SOLN
3.0000 mL | Freq: Three times a day (TID) | RESPIRATORY_TRACT | Status: DC
  Administered 2013-06-26 – 2013-06-27 (×4): 3 mL via RESPIRATORY_TRACT
  Filled 2013-06-26 (×5): qty 3

## 2013-06-26 MED ORDER — FINASTERIDE 5 MG PO TABS
5.0000 mg | ORAL_TABLET | Freq: Every day | ORAL | Status: DC
  Filled 2013-06-26: qty 1

## 2013-06-26 MED ORDER — SODIUM CHLORIDE 0.9 % IV SOLN
1020.0000 mg | Freq: Once | INTRAVENOUS | Status: AC
  Administered 2013-06-26: 1020 mg via INTRAVENOUS
  Filled 2013-06-26: qty 34

## 2013-06-26 MED ORDER — RIVAROXABAN 15 MG PO TABS
15.0000 mg | ORAL_TABLET | Freq: Every day | ORAL | Status: DC
  Administered 2013-06-26: 15 mg via ORAL
  Filled 2013-06-26 (×2): qty 1

## 2013-06-26 NOTE — Progress Notes (Signed)
Multiple runs of non sustained vtach noted, pt asymptomatic, refer to doc flow sheets for record of vital signs.  Notified MD on call Jordan LikesSchmitz, no new orders.  Will continue to monitor and evaluate patient.

## 2013-06-26 NOTE — H&P (Signed)
I have seen and examined this patient. I have discussed with Drs Jordan LikesSchmitz & Piedad ClimesHonig.  I agree with their findings and plans as documented in their admission note.  Acute Issues 1. Dyspnea on Exertion, acute on chronic - Working diagnosis: Acute Exacerbation of COPD - Other contributors include HFrEF with ADHF - Patient with acute cough, purulent sputum and worsening dyspnea c/w AECOPD - While ADHF is supported by the elevated Pro-BNP, the CHEST XRAY and Physical exam do not show signs of acute congestive heart failure.  Additionally, the elevated serum creatinine improved with addition of gentle IVF hydratiion.  2. Urinary Retention - 700 ml on catheterization, patient's feeling of abdominal distention improved after cathertization (In & Out) - Longstanding BPH followed at [facility]. Has been told he retains some urine after voiding by urology.  - On Flomax 0.4 mg twice a day - Medications that can contribute to retention: Vicodin - No UTI symptoms. Urinalysis with Negative Nitritie and Negative LE and Negative Hemoglobin.  - Suspect that this retention is chronic, perhaps increased volume from patient's diuretics.  Plan: - Continue Levaquin, Prednisone and shoart activing bronchodilators - Consult Physical Therapy to assess patient ability to be ambulate without supervision. - Repeat Post void Residual before discharge. - Contact [facility] to see if they want to see Andrew [name] sooner as outpatient or inpatient.  - Could consider addition of 5-alpha reductase inhibitor like finasteride, though its potential urinary bladder emptying benefit would take several months to manifest.  Patient would want to discuss this addition with his Urologist.

## 2013-06-26 NOTE — Progress Notes (Signed)
FMTS Attending Daily Note: Andrew Neal MD [phone] pager office [phone] I  have seen and examined this patient, reviewed their chart. I have discussed this patient with the resident. I agree with the resident's findings, assessment and care plan. 

## 2013-06-26 NOTE — Discharge Instructions (Addendum)
[name], you were admitted for a COPD exacerbation and being a little bit dehydrated.  Your breathing improved with steroids. We will send you home to complete a 5 day dose of steroids. We will also be changing your Xarelto.  We are changing this to 15 mg daily. This is because of your kidney function.    I spoke to your Urology clinic and they recommended that you call them in order to set up an appointment.    Please follow up with Dr. Cleta Albertsaub in the walk in clinic within a week.    Information on my medicine - XARELTO (Rivaroxaban)  This medication education was reviewed with me or my healthcare representative as part of my discharge preparation.  The pharmacist that spoke with me during my hospital stay was:  Andrew [name], Andrew [name], Andrew [name]  Why was Xarelto prescribed for you? Xarelto was prescribed for you to reduce the risk of a blood clot forming that can cause a stroke if you have a medical condition called atrial fibrillation (a type of irregular heartbeat).  What do you need to know about xarelto ? Take your Xarelto ONCE DAILY at the same time every day with your evening meal. If you have difficulty swallowing the tablet whole, you may crush it and mix in applesauce just prior to taking your dose.  Take Xarelto exactly as prescribed by your doctor and DO NOT stop taking Xarelto without talking to the doctor who prescribed the medication.  Stopping without other stroke prevention medication to take the place of Xarelto may increase your risk of developing a clot that causes a stroke.  Refill your prescription before you run out.  After discharge, you should have regular check-up appointments with your healthcare provider that is prescribing your Xarelto.  In the future your dose may need to be changed if your kidney function or weight changes by a significant amount.  What do you do if you miss a dose? If you are taking Xarelto ONCE DAILY and you miss a dose, take it as  soon as you remember on the same day then continue your regularly scheduled once daily regimen the next day. Do not take two doses of Xarelto at the same time or on the same day.   Important Safety Information A possible side effect of Xarelto is bleeding. You should call your healthcare provider right away if you experience any of the following:   Bleeding from an injury or your nose that does not stop.   Unusual colored urine (red or dark brown) or unusual colored stools (red or black).   Unusual bruising for unknown reasons.   A serious fall or if you hit your head (even if there is no bleeding).  Some medicines may interact with Xarelto and might increase your risk of bleeding while on Xarelto. To help avoid this, consult your healthcare provider or pharmacist prior to using any new prescription or non-prescription medications, including herbals, vitamins, non-steroidal anti-inflammatory drugs (NSAIDs) and supplements.  This website has more information on Xarelto: [email or web address].[email or web address].

## 2013-06-26 NOTE — Evaluation (Signed)
Physical Therapy Evaluation Patient Details Name: Andrew [name] MRN: [id number] DOB: 1931-01-10 Today's Date: 06/26/2013   History of Present Illness  patient presents with COPD exacerbation  Clinical Impression  Patient demonstrates deficits in functional mobility as indicated below. Patient steady initially but as SpO2 decreased patient becomes very dizzy and requires assist. Patient reports that he does not typically use oxygen at home except for at night, recommend trial of ambulation on O2 to see if patient is able to tolerated longer bouts of mobility. Will see as indicated and progress as tolerated.    Follow Up Recommendations Home health PT;Supervision - Intermittent (if conditioning improves may not need HHPT)    Equipment Recommendations  None recommended by PT    Recommendations for Other Services       Precautions / Restrictions Precautions Precautions: Fall      Mobility  Bed Mobility Overal bed mobility: Modified Independent             General bed mobility comments: increased time to perform  Transfers Overall transfer level: Modified independent                  Ambulation/Gait Ambulation/Gait assistance: Min assist (supervision initially, but as O2 dropped assist needed) Ambulation Distance (Feet): 180 Feet Assistive device: 1 person hand held assist;None (none at first, 1 person HHA when O2 dropped) Gait Pattern/deviations: Step-through pattern;Narrow base of support Gait velocity: decreased   General Gait Details: patient initiatlly very steady with mobility, but as patient continued to ambulate SpO2 dropped and patient became very dizzy.  Despite patient reports that he only uses oxygen at night, may need to ambulate with O2 at this time  Stairs            Wheelchair Mobility    Modified Rankin (Stroke Patients Only)       Balance                                     Pertinent Vitals/Pain SpO2 on 2 liters  98% prior to activity, on room air 94% at rest, with ambulation on room air patient desaturated quickly to 82%.  Educated on pursed lip breathing and rest, began to rebound to 86% returned to room placed back on O2 and improved to 98% quickly.  Patient about to receive breathing treatment at conclusion of session.    Home Living Family/patient expects to be discharged to:: Private residence Living Arrangements: Spouse/significant other Available Help at Discharge: Family;Available PRN/intermittently (wife works) Type of Home: House Home Access: Stairs to enter Entrance Stairs-Rails: Right Entrance Stairs-Number of Steps: 2 Home Layout: One level Home Equipment: Environmental consultant - 2 wheels;Cane - single point      Prior Function Level of Independence: Independent               Hand Dominance   Dominant Hand: Right    Extremity/Trunk Assessment   Upper Extremity Assessment: Overall WFL for tasks assessed           Lower Extremity Assessment: Overall WFL for tasks assessed         Communication   Communication: No difficulties  Cognition Arousal/Alertness: Awake/alert Behavior During Therapy: WFL for tasks assessed/performed Overall Cognitive Status: Within Functional Limits for tasks assessed                      General Comments  Exercises        Assessment/Plan    PT Assessment Patient needs continued PT services  PT Diagnosis Difficulty walking   PT Problem List Decreased activity tolerance;Decreased balance  PT Treatment Interventions DME instruction;Gait training;Stair training;Functional mobility training;Therapeutic activities;Therapeutic exercise;Balance training;Patient/family education   PT Goals (Current goals can be found in the Care Plan section) Acute Rehab PT Goals Patient Stated Goal: to go home PT Goal Formulation: With patient Time For Goal Achievement: 07/03/13 Potential to Achieve Goals: Good    Frequency Min 4X/week    Barriers to discharge   wife works during the day    End of Session Equipment Utilized During Treatment: Gait belt;Oxygen Activity Tolerance: Patient tolerated treatment well;Treatment limited secondary to medical complications (Comment) (SpO2 dropped to 82% during ambulation on rm air) Patient left: in bed;with call bell/phone within reach;Other (comment) (with respiratory therapist in room)         Time: [id number] PT Time Calculation (min): 18 min   Charges:   PT Evaluation $Initial PT Evaluation Tier I: 1 Procedure PT Treatments $Gait Training: 8-22 mins   PT G CodesFabio Asa:          Caera Enwright J 06/26/2013, 4:02 PM Charlotte Crumbevon Jabre Heo, PT DPT  [phone]

## 2013-06-26 NOTE — Progress Notes (Signed)
UR completed Ovie Cornelio K. Giulietta Prokop, RN, BSN, MSHL, CCM  06/26/2013 10:40 AM

## 2013-06-26 NOTE — Progress Notes (Signed)
Family Medicine Teaching Service Daily Progress Note Intern Pager: [phone]  Patient name: Andrew [name] Medical record number: [id number] Date of birth: [birth year] Age: 80 y.o. Gender: male  Primary Care Provider: Lucilla Edin, MD Consultants: none Code Status: DNR/DNI  Pt Overview and Major Events to Date:  3/23: admitted for dyspnea   Assessment and Plan: Andrew [name] is a 80 y.o. male presenting with dyspnea . PMH is significant for oxygen dependent COPD, paroxysmal Atrial FIb on xarelto/a-flutter (was previously on amiodarone but had to be dc due to elevated liver enzymes) , ischemic cardiomyopathy, CHF with EF 35% last echo with biventricular pacer, CAD s/p multiple MI (multiple stenting, remote hx of CABG).   #Dyspnea/COPD vs HFrEF exacerbation: history seems most consistent with COPD exacerbation.  -230 mL since admission. Weight unchanged.  - prednisone 40 mg daily  - levaquin 750 mg Q48 based on CCr.  - Duonebs Q4, Q2 PRN  - holding home lasix 40 mg; may restart tomorrow after gentle hydration  - Stopped NS 50 mL/hr for 12 hours;  - Daily weights  - Strict i/o's   #Leukocytosis: Improving and afebrile  - UA: no signs of infection   #HTN: Bp fluctuations between 112-158/65-66. Losartan and Coreg and hydralazine TID as needed  - Losartan 50 mg BID  - Coreg 25 mg BID  - holding hydralazine as to determine what the as needed detail is   #Iron def. Anemia: (Hx of adenomatous colonic polyps): followed by Dr. Myna Hidalgo. Receiving IV iron and feraheme and has an erythropoietin def but not receiving Aranesp based on review of his chart. F/u every 2 months.   #Paroxysmal AFib on xarelto/A flutter: s/p biventricular pacer. Asymptomatic short runs of Vtach overnight.   - Telemetry  - Xarelto 15 mg instead of home dose 20 mg based on CCr.   #BPH: Patient had urinary rentention overnight. In and out cath revealed 700 mL. This is after receiving a dose of IV lasix in the ED.  -  flomax .4 mg BID  - added finasteride   #GERD: stable and given protonix based on formulary   FEN/GI: Heart healthy diet/ Saline lock   Prophylaxis: on xarelto   Disposition: pending improvement   Subjective: Patient is feeling much better today. He had a 700 mL of urine this morning.   Objective: Temp:  [97.4 F (36.3 C)-98.3 F (36.8 C)] 97.4 F (36.3 C) (03/24 0016) Pulse Rate:  [67-92] 78 (03/24 0321) Resp:  [20-29] 22 (03/24 0016) BP: (112-158)/(59-80) 158/74 mmHg (03/24 0321) SpO2:  [89 %-99 %] 98 % (03/24 0321) Weight:  [185 lb (83.915 kg)-185 lb 14.4 oz (84.324 kg)] 185 lb 14.4 oz (84.324 kg) (03/24 0016) Physical Exam: General: NAD, alert, coopeartive with exam, Elderly, Caucasin male, 3L [place]  HEENT: [place]/AT, tacky MM, EOMI  Cardiovascular: RRR, S1S2, no murmurs, rubs or gallops, CR brisk  Respiratory: CTAB, minimal wheezing, no crackles or rhonchi, no extra work of breathing  Abdomen: soft, +BS, NTND, no HSM  Extremities: moves all extremities freely, DP and PT pulses +2 b/l; no edema  Skin: no rashes  Neuro: alert and oriented, no gross focal deficits.    Laboratory:  Recent Labs Lab 06/25/13 1338 06/26/13 0540  WBC 19.1* 11.3*  HGB 11.1* 11.2*  HCT 33.3* 32.1*  PLT 230 233    Recent Labs Lab 06/25/13 1714 06/26/13 0540  NA 133* 133*  K 4.2 3.7  CL 94* 97  CO2 25 20  BUN 32*  35*  CREATININE 1.98* 1.55*  CALCIUM 8.8 8.8  GLUCOSE 110* 171*    Urinalysis    Component Value Date/Time   COLORURINE AMBER* 06/25/2013 2255   APPEARANCEUR CLOUDY* 06/25/2013 2255   LABSPEC 1.019 06/25/2013 2255   PHURINE 5.0 06/25/2013 2255   GLUCOSEU NEGATIVE 06/25/2013 2255   HGBUR NEGATIVE 06/25/2013 2255   BILIRUBINUR NEGATIVE 06/25/2013 2255   BILIRUBINUR neg 12/06/2012 1030   KETONESUR NEGATIVE 06/25/2013 2255   PROTEINUR 100* 06/25/2013 2255   UROBILINOGEN 0.2 06/25/2013 2255   UROBILINOGEN 0.2 12/06/2012 1030   NITRITE NEGATIVE 06/25/2013 2255   NITRITE neg 12/06/2012  1030   LEUKOCYTESUR NEGATIVE 06/25/2013 2255    Imaging/Diagnostic Tests:   Myra RudeJeremy E Golden Gilreath, MD 06/26/2013, 8:39 AM PGY-1, [facility] FPTS Intern pager: [phone], text pages welcome

## 2013-06-26 NOTE — Telephone Encounter (Signed)
Family called after pt had add on lab yesterday pt wasn't feeling well he was admitted to hospital. I left RN message to triage

## 2013-06-26 NOTE — Progress Notes (Signed)
Patient complained of difficulty urinating, urine output at 200 ml since admission.  Bladder scan done, showed >521 ml. Notified provider on call, ordered for in and out.  Output from in and out totalled 700 ml.  Post intermittent cath bladder scan performed showed 30 ml.  Will continue to monitor and evaluate patient.

## 2013-06-26 NOTE — Progress Notes (Signed)
Pt's family called to let us know that [facility].  Dr. Myna HidalgoEnnever made aware.

## 2013-06-26 NOTE — Progress Notes (Signed)
Able to move about room with minimal assistance.  Respirations even and unlabored.  Tolearated iron preparation without difficulty.

## 2013-06-26 NOTE — Care Management Note (Addendum)
    Page 1 of 1   06/28/2013     2:38:55 PM   CARE MANAGEMENT NOTE 06/28/2013  Patient:  Andrew [name],Andrew [name]   Account Number:  [id number]  Date Initiated:  06/26/2013  Documentation initiated by:  HUTCHINSON,CRYSTAL  Subjective/Objective Assessment:   Admitted with SOB, COPD     Action/Plan:   CM to follow for disposition needs   Anticipated DC Date:  06/29/2013   Anticipated DC Plan:  HOME/SELF CARE      DC Planning Services  CM consult      PAC Choice  NA   Choice offered to / List presented to:  C-1 Patient        HH arranged  HH - 11 Patient Refused      Status of service:  Completed, signed off Medicare Important Message given?   (If response is "NO", the following Medicare IM given date fields will be blank) Date Medicare IM given:   Date Additional Medicare IM given:    Discharge Disposition:  HOME/SELF CARE  Per UR Regulation:  Reviewed for med. necessity/level of care/duration of stay  If discussed at Long Length of Stay Meetings, dates discussed:    Comments:  06/27/13 1015 Oletta Cohnamellia Wood, RN, BSN, [place] [phone] I have recommended that this patient have Home Health PT but he declines at this time. I have discussed the risks and benefits of this Home Health PT with him. The patient verbalizes understanding.  06/27/2013 ED SAT 89% Dispositon Plan:  pending. Crystal Hutchinson RN, BSN, [place], [place] 06/27/2013

## 2013-06-27 DIAGNOSIS — I5022 Chronic systolic (congestive) heart failure: Secondary | ICD-10-CM

## 2013-06-27 DIAGNOSIS — I4891 Unspecified atrial fibrillation: Secondary | ICD-10-CM

## 2013-06-27 LAB — BASIC METABOLIC PANEL
BUN: 38 mg/dL — ABNORMAL HIGH (ref 6–23)
CALCIUM: 9.1 mg/dL (ref 8.4–10.5)
CO2: 20 meq/L (ref 19–32)
Chloride: 100 mEq/L (ref 96–112)
Creatinine, Ser: 1.28 mg/dL (ref 0.50–1.35)
GFR calc Af Amer: 59 mL/min — ABNORMAL LOW (ref >90)
GFR calc non Af Amer: 51 mL/min — ABNORMAL LOW (ref >90)
Glucose, Bld: 155 mg/dL — ABNORMAL HIGH (ref 70–99)
POTASSIUM: 4 meq/L (ref 3.7–5.3)
SODIUM: 135 meq/L — AB (ref 137–147)

## 2013-06-27 LAB — CBC
HCT: 33.1 % — ABNORMAL LOW (ref 39.0–52.0)
Hemoglobin: 11.4 g/dL — ABNORMAL LOW (ref 13.0–17.0)
MCH: 32.3 pg (ref 26.0–34.0)
MCHC: 34.4 g/dL (ref 30.0–36.0)
MCV: 93.8 fL (ref 78.0–100.0)
PLATELETS: 292 10*3/uL (ref 150–400)
RBC: 3.53 MIL/uL — AB (ref 4.22–5.81)
RDW: 13.9 % (ref 11.5–15.5)
WBC: 15.4 10*3/uL — AB (ref 4.0–10.5)

## 2013-06-27 MED ORDER — FUROSEMIDE 20 MG PO TABS
20.0000 mg | ORAL_TABLET | Freq: Three times a day (TID) | ORAL | Status: DC
  Administered 2013-06-27: 20 mg via ORAL
  Filled 2013-06-27 (×3): qty 1

## 2013-06-27 MED ORDER — PREDNISONE 20 MG PO TABS: 40.0000 mg | ORAL_TABLET | Freq: Every day | ORAL | Status: DC

## 2013-06-27 MED ORDER — RIVAROXABAN 15 MG PO TABS
15.0000 mg | ORAL_TABLET | Freq: Every day | ORAL | Status: DC
Start: 2013-06-27 — End: 2013-06-27

## 2013-06-27 MED ORDER — RIVAROXABAN 15 MG PO TABS: 15.0000 mg | ORAL_TABLET | Freq: Every day | ORAL | Status: DC

## 2013-06-27 NOTE — Progress Notes (Signed)
Physical Therapy Treatment Patient Details Name: Andrew [name] MRN: [id number] DOB: 1931-02-15 Today's Date: 06/27/2013    History of Present Illness patient presents with COPD exacerbation    PT Comments    Pt admitted with above. Pt currently with functional limitations due to balance and endurance deficits listed below (see PT Problem List). Pt will benefit from skilled PT to increase their independence and safety with mobility to allow discharge to the venue listed below.   Follow Up Recommendations  Home health PT;Supervision - Intermittent     Equipment Recommendations  None recommended by PT    Recommendations for Other Services       Precautions / Restrictions Precautions Precautions: Fall Restrictions Weight Bearing Restrictions: No    Mobility  Bed Mobility Overal bed mobility: Modified Independent             General bed mobility comments: increased time to perform  Transfers Overall transfer level: Modified independent                  Ambulation/Gait Ambulation/Gait assistance: Min guard Ambulation Distance (Feet): 180 Feet Assistive device: None Gait Pattern/deviations: Step-through pattern;Narrow base of support Gait velocity: decreased Gait velocity interpretation: Below normal speed for age/gender General Gait Details: Pt steady with mobility today.  No LOB.  Did not challenge pt.     Stairs            Wheelchair Mobility    Modified Rankin (Stroke Patients Only)       Balance Overall balance assessment: Needs assistance         Standing balance support: No upper extremity supported;During functional activity Standing balance-Leahy Scale: Fair Standing balance comment: can stand statically iwhtout assist.                     Cognition Arousal/Alertness: Awake/alert Behavior During Therapy: WFL for tasks assessed/performed Overall Cognitive Status: Within Functional Limits for tasks assessed                      Exercises General Exercises - Lower Extremity Ankle Circles/Pumps: AROM;5 reps;Both;Supine Heel Slides: AROM;Both;5 reps;Standing Hip ABduction/ADduction: AROM;Both;5 reps;Standing Hip Flexion/Marching: AROM;Both;5 reps;Standing    General Comments        Pertinent Vitals/Pain Sat at rest 94% with 2LO2.  93% on RA at rest.  Pt desat to 74% on RA with ambulation.  Replaced O2 and sat at 83% on 2L with ambulation and 92% on 3L with ambulation.  Appears to need to ambulate with 3LO2 to keep sats >90%.  Cues for pursed lip breathing and to not talk while ambulating and pt agrees.  No pain.     Home Living                      Prior Function            PT Goals (current goals can now be found in the care plan section) Progress towards PT goals: Progressing toward goals    Frequency  Min 4X/week    PT Plan Current plan remains appropriate    End of Session Equipment Utilized During Treatment: Gait belt;Oxygen Activity Tolerance: Patient tolerated treatment well;Treatment limited secondary to medical complications (Comment) (SpO2 dropped to 74% during ambulation on rm air) Patient left: in bed;with call bell/phone within reach;with family/visitor present     Time: 1140-1205 PT Time Calculation (min): 25 min  Charges:  $Gait Training: 8-22 mins $Therapeutic Exercise: 8-22  mins                    G Codes:      INGOLD,Roberth Berling 06/27/2013, 1:04 PM [facility] In[facility],PT Acute Rehabilitation [phone] (pager)

## 2013-06-27 NOTE — Progress Notes (Signed)
The patient did not have any acute changes overnight.  He states that he is feeling much better this morning.  He complained of back pain this morning and received his scheduled Vicodin.  The patient's output was 850 ml overnight.

## 2013-06-27 NOTE — Clinical Documentation Improvement (Signed)
Possible Clinical Conditions?  Acute Respiratory Failure Acute on Chronic Respiratory Failure Chronic Respiratory Failure Acute Respiratory Insufficiency Acute Respiratory Insufficiency following surgery or trauma Other Condition Cannot Clinically Determine   Supporting Information:  Signs & Symptoms:"PMH is significant for oxygen dependent COPD, paroxysmal Atrial FIb on xarelto/a-flutter (was previously on amiodarone but had to be dc due to elevated liver enzymes) , ischemic cardiomyopathy, CHF with EF 35% last echo with biventricular pacer, CAD s/p multiple MI (multiple stenting, remote hx of CABG)." Dyspnea, SpO2 93%   Treatment: O2 @ 2l/m via [place]  Thank You, Nevin BloodgoodJoan B Beverlyn Mcginness, RN, BSN, CCDS, Clinical Documentation Specialist:  [phone]   Cell= [phone] [facility]- Health Information Management

## 2013-06-27 NOTE — Discharge Summary (Signed)
Family Medicine Teaching [facility] Discharge Summary  Patient name: Andrew [name] Medical record number: [id number] Date of birth: 16-Oct-1930 Age: 80 y.o. Gender: male Date of Admission: 06/25/2013  Date of Discharge: 06/27/13 Admitting Physician: Leighton Roach McDiarmid, MD  Primary Care Provider: Lucilla Edin, MD Consultants: none   Indication for Hospitalization: dyspnea   Discharge Diagnoses/Problem List:  Patient Active Problem List   Diagnosis Date Noted  . Urinary retention 06/26/2013  . COPD exacerbation 06/25/2013  . Iron deficiency anemia, unspecified 03/20/2013  . S/P biventricular cardiac pacemaker-[employer] 06/17/2011  . Atrial fibrillation 04/06/2011  . Coronary atherosclerosis of native coronary artery 01/15/2011  . Hyperlipidemia 11/26/2010  . Paroxysmal ventricular tachycardia 11/05/2010  . Ischemic cardiomyopathy 10/14/2010  . Atrial flutter   . Chronic systolic heart failure   . Hypertension   . Nonspecific (abnormal) findings on radiological and other examination of body structure 01/12/2010  . ABNORMAL LUNG XRAY 01/12/2010  . DYSPNEA 12/02/2009  . LIVER FUNCTION TESTS, ABNORMAL, HX OF 12/02/2009  . UNSPECIFIED ANEMIA 05/05/2009  . GERD 04/04/2007  . PERSONAL HISTORY OF COLONIC POLYPS 04/04/2007   Disposition: home with home health PT   Discharge Condition: improved   Discharge Exam:  General: NAD, alert, coopeartive with exam, Elderly, Caucasin male, 2L [place]  HEENT: [place]/AT, EOMI  Cardiovascular: RRR, S1S2, no murmurs, rubs or gallops, CR brisk  Respiratory: CTAB, minimal wheezing, no crackles or rhonchi, no extra work of breathing  Extremities: moves all extremities freely, DP and PT pulses +2 b/l; no edema  Skin: no rashes  Neuro: alert and oriented, no gross focal deficits.   Brief Hospital Course:  Andrew [name] is a 80 y.o. male presenting with dyspnea . PMH is significant for oxygen dependent COPD, paroxysmal Atrial FIb on xarelto/a-flutter  (was previously on amiodarone but had to be dc due to elevated liver enzymes) , ischemic cardiomyopathy, CHF with EF 35% last echo with biventricular pacer, CAD s/p multiple MI (multiple stenting, remote hx of CABG).   #Dyspnea/COPD vs HFrEF exacerbation: patient was found to be hypoxemic on admission with an increased O2 need. He uses 2L QHS but was on 3L in the ED. He had no extra work of breathing and no crackles heard on exam.  His presentation was most consistent for a COPD exacerbation.  He appeared slightly dehydrated as well and given gently fluids.  He improved greatly with the fluid administration.  His BNP was elevated but didn't appear to be volume overloaded with no evidence on CXR. He received a dose of levaquin in the ED and started on prednisone 40 mg daily. He was not continued on any antibiotics.  He was restarted on his home lasix.  He had hypoxemic upon ambulation (74%) that qualified him for home oxygen.  Patient was feeling much improved prior to discharge. He was given a 5 day course of prednisone 40 mg.   #Leukocytosis: Improved prior to the initiation of the steroids. He remained afebrile with no signs of infection.    #HTN: He was continued on Losartan 50 mg BID and Coreg 25 mg BID with blood pressures remaining stable.   #Iron def. Anemia: (Hx of adenomatous colonic polyps): Pateint see Andrew [name] for IV feraheme. Patient was given 1020 g of IV feraheme during his admission.   #Paroxysmal AFib on xarelto/A flutter: s/p biventricular pacer. Asymptomatic short runs of Vtach during his stay.  Upon admission his xarelto was changed from 20 mg to 15 mg daily due to his  creatinine clearance. He was discharged on the 15 mg for the same reason. This was discussed with in house pharmacy prior to the change.    #BPH: patient was given a dose of lasix in the ED. He was found to have ~700 mL on a post void residual. He was in and out cathed. He didn't want to start a medication until he  could talk with his urologist first.  He was continued on his home flomax.  Prior to discharge a bladder scan showed 257 mL. He was given the warning indications and return precautions.  [facility] was called and advised for the patient to call upon discharge in order to set up a hospital follow up appointment.   #GERD: stable and given protonix based on formulary  Issues for Follow Up:  1. Spoke to Pharmacy about his anticoagulation and based on his creatinine clearence they recommended 15 mg daily instead of his home dose of 20 mg daily of xarelto.  2. Called [facility] and they advised having the patient call upon discharge to schedule a follow up. This is based on his post void residual volume of ~700 mL on the second day of admission. Will talk to his urologist about the possibility of starting proscar.  3. Consider EKG at f/u for QT prolongation. Patient only received one dose of Levaquin.   Significant Procedures: none  Significant Labs and Imaging:   Recent Labs Lab 06/25/13 1338 06/26/13 0540 06/27/13 0622  WBC 19.1* 11.3* 15.4*  HGB 11.1* 11.2* 11.4*  HCT 33.3* 32.1* 33.1*  PLT 230 233 292    Recent Labs Lab 06/25/13 1714 06/26/13 0540 06/27/13 0622  NA 133* 133* 135*  K 4.2 3.7 4.0  CL 94* 97 100  CO2 25 20 20   GLUCOSE 110* 171* 155*  BUN 32* 35* 38*  CREATININE 1.98* 1.55* 1.28  CALCIUM 8.8 8.8 9.1   istat Trop: 0.06   Recent Labs Lab 06/25/13 1714  PROBNP 6196.0*   Results/Tests Pending at Time of Discharge: none  Discharge Medications:    Medication List         albuterol 108 (90 BASE) MCG/ACT inhaler  Commonly known as:  PROVENTIL HFA;VENTOLIN HFA  Inhale 1 puff into the lungs every 6 (six) hours as needed for wheezing or shortness of breath.     albuterol (2.5 MG/3ML) 0.083% nebulizer solution  Commonly known as:  PROVENTIL  Take 3 mLs (2.5 mg total) by nebulization every 6 (six) hours as needed for wheezing.     ALPRAZolam 0.25  MG tablet  Commonly known as:  XANAX  Take 0.25 mg by mouth daily as needed. For anxiety.     carvedilol 25 MG tablet  Commonly known as:  COREG  Take 25 mg by mouth 2 (two) times daily with a meal.     esomeprazole 40 MG capsule  Commonly known as:  NEXIUM  Take 1 capsule (40 mg total) by mouth 2 (two) times daily. PATIENT NEEDS OFFICE VISIT FOR ADDITIONAL REFILLS     fluticasone 50 MCG/ACT nasal spray  Commonly known as:  FLONASE  Place 2 sprays into the nose as needed for allergies.     Fluticasone-Salmeterol 250-50 MCG/DOSE Aepb  Commonly known as:  ADVAIR DISKUS  Inhale 1 puff into the lungs 2 (two) times daily.     furosemide 40 MG tablet  Commonly known as:  LASIX  Take 20 mg by mouth 3 (three) times daily.     hydrALAZINE 25  MG tablet  Commonly known as:  APRESOLINE  Take 12.5 mg by mouth 3 (three) times daily. Prn     HYDROcodone-acetaminophen 5-325 MG per tablet  Commonly known as:  NORCO/VICODIN  Take 1 tablet by mouth 2 (two) times daily. Arthritis pain     ipratropium 0.02 % nebulizer solution  Commonly known as:  ATROVENT  Take 2.5 mLs (500 mcg total) by nebulization 4 (four) times daily.     KLOR-CON M10 10 MEQ tablet  Generic drug:  potassium chloride  Take 10 mEq by mouth daily.     losartan 50 MG tablet  Commonly known as:  COZAAR  Take 50 mg by mouth 2 (two) times daily.     nitroGLYCERIN 0.4 MG SL tablet  Commonly known as:  NITROSTAT  Place 0.4 mg under the tongue every 5 (five) minutes as needed for chest pain.     predniSONE 20 MG tablet  Commonly known as:  DELTASONE  Take 2 tablets (40 mg total) by mouth daily with breakfast.     pyridOXINE 100 MG tablet  Commonly known as:  VITAMIN B-6  Take 100 mg by mouth daily.     Rivaroxaban 15 MG Tabs tablet  Commonly known as:  XARELTO  Take 1 tablet (15 mg total) by mouth daily with supper.     tamsulosin 0.4 MG Caps capsule  Commonly known as:  FLOMAX  Take 0.4 mg by mouth 2 (two) times  daily.        Discharge Instructions: Please refer to Patient Instructions section of EMR for full details.  Patient was counseled important signs and symptoms that should prompt return to medical care, changes in medications, dietary instructions, activity restrictions, and follow up appointments.   Follow-Up Appointments: Follow-up Information   Follow up with DAUB, STEVE A, MD In 1 week. [facility](Walk-in Clinic will do follow -up )    Specialty:  Family Medicine   Contact information:   [address] [place] [address] [phone]       Myra RudeJeremy E Traivon Morrical, MD 06/30/2013, 2:02 PM PGY-1, [facility]

## 2013-06-27 NOTE — Progress Notes (Signed)
Patient discharged to home with wife on 2L of oxygen.  IV removed prior to discharge; IV site clean, dry, and intact.  Discharge instructions, education, and information discussed with patient and patient's wife.  Both voiced understanding of discharge information.  MD notified that patient's mail-order prescriptions would not be available this afternoon; [facility].

## 2013-06-27 NOTE — Progress Notes (Signed)
Patient alert and oriented x4, wife at bedside.  Vital signs stable.  Patient and wife deny any questions or concerns at this time.  Post-void residual using bladder scan.  MD notified.  Patient had 13 beat run of v-tach this morning.  Asymptomatic, MD notified.  Will continue to monitor.

## 2013-06-27 NOTE — Progress Notes (Signed)
Family Medicine Teaching Service Daily Progress Note Intern Pager: [phone]  Patient name: Andrew Gallusdward L Bruni Medical record number: [id number] Date of birth: 23-Sep-1930 Age: 80 y.o. Gender: male  Primary Care Provider: Lucilla EdinAUB, STEVE A, MD Consultants: none Code Status: DNR/DNI  Pt Overview and Major Events to Date:  3/23: admitted for dyspnea  3/24: IV feraheme    Assessment and Plan: Andrew [name] is a 80 y.o. male presenting with dyspnea . PMH is significant for oxygen dependent COPD, paroxysmal Atrial FIb on xarelto/a-flutter (was previously on amiodarone but had to be dc due to elevated liver enzymes) , ischemic cardiomyopathy, CHF with EF 35% last echo with biventricular pacer, CAD s/p multiple MI (multiple stenting, remote hx of CABG).   #Dyspnea/COPD vs HFrEF exacerbation: history seems most consistent with COPD exacerbation.  -230 mL since admission. Weight 185>187 - prednisone 40 mg daily   - Duonebs Q4, Q2 PRN  - restarted home lasix   - Stopped NS 50 mL/hr for 12 hours;  - PT: home health   #Leukocytosis: Increasing but started on steroids as above.   #HTN: Bp fluctuations between 159-160/71-73. Losartan and Coreg and hydralazine TID as needed  - Losartan 50 mg BID  - Coreg 25 mg BID  - holding hydralazine as to determine what the as needed detail is   #Iron def. Anemia: (Hx of adenomatous colonic polyps):  - IV feraheme yesterday    #Paroxysmal AFib on xarelto/A flutter: s/p biventricular pacer. Asymptomatic short runs of Vtach overnight.   - Telemetry  - Xarelto 15 mg instead of home dose 20 mg based on CCr.   #BPH: he has been urinating with no problems since the in and out cath. [facility] was called yesterday and advised for the patient to call them for a hospital f/u upon discharge.   - flomax .4 mg BID  - f/u bladder scan   #GERD: stable and given protonix based on formulary   FEN/GI: Heart healthy diet/ Saline lock   Prophylaxis: on xarelto   Disposition:  pending improvement   Subjective: Patient feeling much better today. His breathing is at its baseline.   Objective: Temp:  [97.3 F (36.3 C)-97.5 F (36.4 C)] 97.3 F (36.3 C) (03/25 0557) Pulse Rate:  [67-76] 67 (03/25 0557) Resp:  [17-18] 17 (03/25 0557) BP: (159-160)/(71-81) 159/71 mmHg (03/25 0557) SpO2:  [93 %-97 %] 97 % (03/25 0557) Weight:  [187 lb 2.7 oz (84.9 kg)] 187 lb 2.7 oz (84.9 kg) (03/25 0557) Physical Exam: General: NAD, alert, coopeartive with exam, Elderly, Caucasin male, 2L [place]  HEENT: /AT, EOMI  Cardiovascular: RRR, S1S2, no murmurs, rubs or gallops, CR brisk  Respiratory: CTAB, minimal wheezing, no crackles or rhonchi, no extra work of breathing  Extremities: moves all extremities freely, DP and PT pulses +2 b/l; no edema  Skin: no rashes  Neuro: alert and oriented, no gross focal deficits.    Laboratory:  Recent Labs Lab 06/25/13 1338 06/26/13 0540 06/27/13 0622  WBC 19.1* 11.3* 15.4*  HGB 11.1* 11.2* 11.4*  HCT 33.3* 32.1* 33.1*  PLT 230 233 292    Recent Labs Lab 06/25/13 1714 06/26/13 0540 06/27/13 0622  NA 133* 133* 135*  K 4.2 3.7 4.0  CL 94* 97 100  CO2 25 20 20   BUN 32* 35* 38*  CREATININE 1.98* 1.55* 1.28  CALCIUM 8.8 8.8 9.1  GLUCOSE 110* 171* 155*    Imaging/Diagnostic Tests:   Myra RudeJeremy E Yalexa Blust, MD 06/27/2013, 7:47 AM PGY-1, [place]  Family Medicine FPTS Intern pager: [phone], text pages welcome

## 2013-06-27 NOTE — Plan of Care (Signed)
Problem: Phase I Progression Outcomes Goal: EF % per last Echo/documented,Core Reminder form on chart Outcome: Completed/Met Date Met:  06/27/13 30-35% as of 05/2012

## 2013-06-27 NOTE — Progress Notes (Signed)
SATURATION QUALIFICATIONS: (This note is used to comply with regulatory documentation for home oxygen)  Patient Saturations on Room Air at Rest = 94%  Patient Saturations on Room Air while Ambulating = 74%  Patient Saturations on 3 Liters of oxygen while Ambulating = 92%  Please briefly explain why patient needs home oxygen:Pt desats with activity without O2.  Thanks. [facility] HospitalDawn Ingold,PT Acute Rehabilitation [phone] (pager)

## 2013-06-27 NOTE — Progress Notes (Signed)
FMTS Attending Daily Note: Andrew Borchers MD [phone] pager office [phone] I  have seen and examined this patient, reviewed their chart. I have discussed this patient with the resident. I agree with the resident's findings, assessment and care plan. 

## 2013-06-27 NOTE — ED Provider Notes (Signed)
CSN: [id number]     Arrival date & time 06/25/13  1700 History   First MD Initiated Contact with Patient 06/25/13 1722     Chief Complaint  Patient presents with  . Shortness of Breath  . Dizziness     (Consider location/radiation/quality/duration/timing/severity/associated sxs/prior Treatment) HPI Comments: 80 y.o. male presenting with dyspnea . PMH is significant for oxygen dependent COPD, paroxysmal Atrial FIb on xarelto/a-flutter (was previously on amiodarone but had to be dc due to elevated liver enzymes) , ischemic cardiomyopathy, CHF with EF 35% last echo with biventricular pacer, CAD s/p multiple MI (multiple stenting, remote hx of CABG). Pt states that lately, he has been needing more O2 over the past few days. He has been easily getting short of breath. Pt has been having a cough at night and felt like he had something in his throat that he couldn't get up. His cough is new, and on occasion he has had brown sputum. No chest pains. No fevers, chills, or increased wheezing.   Patient is a 80 y.o. male presenting with shortness of breath and dizziness. The history is provided by the patient and medical records.  Shortness of Breath Associated symptoms: cough   Associated symptoms: no abdominal pain, no chest pain and no wheezing   Dizziness Associated symptoms: shortness of breath   Associated symptoms: no chest pain     Past Medical History  Diagnosis Date  . Chronic systolic heart failure     a. Chronic LV systolic failure. b. Echo in Dec 2011 showed EF of 25% - f/u echo 02/2011: Mild LVH, EF 15%, posterior lateral akinesis, inferior akinesis, grade 1 diastolic dysfunction, mild LAE. c. s/p BiV pacemaker 06/2011.  [name] Atrial flutter     a. Off amiodarone because of increased LFTs  . COPD (chronic obstructive pulmonary disease)     Chronic SOB, O2 dependent  . Myocardial infarction 1994  . Gastro - esophageal reflux   . Claudication   . BPH (benign prostatic hyperplasia)   .  Coronary artery disease     a. Myocardial infarction in 1984,  99 and 07. b. H/o multiple RCA stents. c.  LHC 04/2011: without obstructive disease. oLM 25%, mLAD 30%, pCFX 25%, prox to mid RCA stent ok, dRCA 30% and 70%, pPDA 25%, mPL 25-30%, EF 25%.  [name] Hypertension   . Atrial fibrillation     a. On Xarelto.  . Hemorrhoids   . Hyperlipidemia     statin intolerant 2/2 myalgias  . Hx of adenomatous colonic polyps   . Hx of colonoscopy   . Chronic renal insufficiency   . Biventricular cardiac pacemaker in situ 3.2013  . NSVT (nonsustained ventricular tachycardia)     a. 04/2012- 6 beats on tele.  [name] Shortness of breath   . Arthritis     hips ,back ,hands  . Iron deficiency anemia, unspecified 03/20/2013   Past Surgical History  Procedure Laterality Date  . Cardiac catheterization  2008  . Cataract extraction    . Salivary gland surgery    . Coronary angioplasty with stent placement    . Insert / replace / remove pacemaker  06/16/2011  . Tee without cardioversion  05/08/2012    Procedure: TRANSESOPHAGEAL ECHOCARDIOGRAM (TEE);  Surgeon: Vesta Mixer, MD;  Location: [facility] ENDOSCOPY;  Service: Cardiovascular;  Laterality: N/A;   Family History  Problem Relation Age of Onset  . Coronary artery disease Father   . Heart disease Mother   . Colon cancer Neg Hx   .  Heart disease Brother    History  Substance Use Topics  . Smoking status: Former Smoker -- 1.50 packs/day for 55 years    Types: Cigarettes    Start date: 05/[birth year]/1954    Quit date: 01/03/2009  . Smokeless tobacco: Never Used     Comment: quit 4 years ago  . Alcohol Use: No     Comment: occasional    Review of Systems  Constitutional: Negative for activity change and appetite change.  Respiratory: Positive for cough and shortness of breath. Negative for wheezing.   Cardiovascular: Negative for chest pain.  Gastrointestinal: Negative for abdominal pain.  Genitourinary: Negative for dysuria.  Neurological: Negative for  dizziness.  All other systems reviewed and are negative.      Allergies  Codeine; Inspra; Spironolactone; Doxycycline; and Sulfonamide derivatives  Home Medications   Current Outpatient Rx  Name  Route  Sig  Dispense  Refill  . albuterol (PROVENTIL HFA;VENTOLIN HFA) 108 (90 BASE) MCG/ACT inhaler   Inhalation   Inhale 1 puff into the lungs every 6 (six) hours as needed for wheezing or shortness of breath.         [name] albuterol (PROVENTIL) (2.5 MG/3ML) 0.083% nebulizer solution   Nebulization   Take 3 mLs (2.5 mg total) by nebulization every 6 (six) hours as needed for wheezing.   75 mL   12   . ALPRAZolam (XANAX) 0.25 MG tablet   Oral   Take 0.25 mg by mouth daily as needed. For anxiety.         . carvedilol (COREG) 25 MG tablet   Oral   Take 25 mg by mouth 2 (two) times daily with a meal.         . esomeprazole (NEXIUM) 40 MG capsule   Oral   Take 1 capsule (40 mg total) by mouth 2 (two) times daily. PATIENT NEEDS OFFICE VISIT FOR ADDITIONAL REFILLS   180 capsule   3   . EXPIRED: fluticasone (FLONASE) 50 MCG/ACT nasal spray   Nasal   Place 2 sprays into the nose as needed for allergies.          . Fluticasone-Salmeterol (ADVAIR DISKUS) 250-50 MCG/DOSE AEPB   Inhalation   Inhale 1 puff into the lungs 2 (two) times daily.   3 each   3   . furosemide (LASIX) 40 MG tablet   Oral   Take 20 mg by mouth 3 (three) times daily.          . hydrALAZINE (APRESOLINE) 25 MG tablet   Oral   Take 12.5 mg by mouth 3 (three) times daily. Prn         . HYDROcodone-acetaminophen (NORCO/VICODIN) 5-325 MG per tablet   Oral   Take 1 tablet by mouth 2 (two) times daily. Arthritis pain         . ipratropium (ATROVENT) 0.02 % nebulizer solution   Nebulization   Take 2.5 mLs (500 mcg total) by nebulization 4 (four) times daily.   75 mL   12   . losartan (COZAAR) 50 MG tablet   Oral   Take 50 mg by mouth 2 (two) times daily.         . nitroGLYCERIN (NITROSTAT)  0.4 MG SL tablet   Sublingual   Place 0.4 mg under the tongue every 5 (five) minutes as needed for chest pain.         . potassium chloride (KLOR-CON M10) 10 MEQ tablet   Oral   Take 10  mEq by mouth daily.          [name]. pyridOXINE (VITAMIN B-6) 100 MG tablet   Oral   Take 100 mg by mouth daily.         . Tamsulosin HCl (FLOMAX) 0.4 MG CAPS   Oral   Take 0.4 mg by mouth 2 (two) times daily.          . predniSONE (DELTASONE) 20 MG tablet   Oral   Take 2 tablets (40 mg total) by mouth daily with breakfast.   4 tablet   0   . Rivaroxaban (XARELTO) 15 MG TABS tablet   Oral   Take 1 tablet (15 mg total) by mouth daily with supper.   30 tablet   0    BP 175/72  Pulse 70  Temp(Src) 97.3 F (36.3 C) (Oral)  Resp 18  Ht 5\' 10"  (1.778 m)  Wt 187 lb 2.7 oz (84.9 kg)  BMI 26.86 kg/m2  SpO2 98% Physical Exam  Nursing note and vitals reviewed. Constitutional: He is oriented to person, place, and time. He appears well-developed.  HENT:  Head: Normocephalic and atraumatic.  Eyes: Conjunctivae and EOM are normal. Pupils are equal, round, and reactive to light.  Neck: Normal range of motion. Neck supple.  Cardiovascular: Normal rate and regular rhythm.   Pulmonary/Chest: Effort normal and breath sounds normal. No respiratory distress. He has no wheezes.  tachypneic - 24 - but no accessory muscle use, or retractions  Abdominal: Soft. Bowel sounds are normal. He exhibits no distension. There is no tenderness. There is no rebound and no guarding.  Musculoskeletal: He exhibits no edema.  Neurological: He is alert and oriented to person, place, and time.  Skin: Skin is warm.    ED Course  Procedures (including critical care time) Labs Review Labs Reviewed  BASIC METABOLIC PANEL - Abnormal; Notable for the following:    Sodium 133 (*)    Chloride 94 (*)    Glucose, Bld 110 (*)    BUN 32 (*)    Creatinine, Ser 1.98 (*)    GFR calc non Af Amer 30 (*)    GFR calc Af Amer 35  (*)    All other components within normal limits  PRO B NATRIURETIC PEPTIDE - Abnormal; Notable for the following:    Pro B Natriuretic peptide (BNP) 6196.0 (*)    All other components within normal limits  URINALYSIS, ROUTINE W REFLEX MICROSCOPIC - Abnormal; Notable for the following:    Color, Urine AMBER (*)    APPearance CLOUDY (*)    Protein, ur 100 (*)    All other components within normal limits  URINE MICROSCOPIC-ADD ON - Abnormal; Notable for the following:    Bacteria, UA FEW (*)    Casts GRANULAR CAST (*)    All other components within normal limits  BASIC METABOLIC PANEL - Abnormal; Notable for the following:    Sodium 133 (*)    Glucose, Bld 171 (*)    BUN 35 (*)    Creatinine, Ser 1.55 (*)    GFR calc non Af Amer 40 (*)    GFR calc Af Amer 47 (*)    All other components within normal limits  CBC - Abnormal; Notable for the following:    WBC 11.3 (*)    RBC 3.47 (*)    Hemoglobin 11.2 (*)    HCT 32.1 (*)    All other components within normal limits  CBC - Abnormal; Notable for  the following:    WBC 15.4 (*)    RBC 3.53 (*)    Hemoglobin 11.4 (*)    HCT 33.1 (*)    All other components within normal limits  BASIC METABOLIC PANEL - Abnormal; Notable for the following:    Sodium 135 (*)    Glucose, Bld 155 (*)    BUN 38 (*)    GFR calc non Af Amer 51 (*)    GFR calc Af Amer 59 (*)    All other components within normal limits  I-STAT TROPOININ, ED   Imaging Review No results found.   EKG Interpretation None      MDM   Final diagnoses:  COPD exacerbation  CHF exacerbation  Hypoxia     Date: 06/27/2013  Rate: 80  Rhythm: ventricular paced  QRS Axis: left  Intervals: normal  ST/T Wave abnormalities: nonspecific ST/T changes  Conduction Disutrbances:none  Narrative Interpretation:   Old EKG Reviewed: none available and unchanged -UNCHANGED  Pt comes in with cc of dib. He is noted to be hypoxic. Has hx of COPD, on O2. Also has CHF. Hx  suggestive of COPD exacerbation vs. PNA. No evidence of CHF based on the cardiac exam and CXR - however, BNP is elevated. Could be pulm HTN, or just related to cor-pulmonale.  Given his significant cardiopulm co morbidities, hypoxia, and tachypnea - we will start him on antibiotics and steroids. Medicine to admit.      Derwood Kaplan, MD 06/27/13 [phone]

## 2013-06-29 ENCOUNTER — Telehealth: Payer: Self-pay | Admitting: Hematology & Oncology

## 2013-06-29 NOTE — Telephone Encounter (Signed)
Called to check on pt to see if we could schedule iron infusion. I left message to call.

## 2013-07-02 ENCOUNTER — Other Ambulatory Visit: Payer: Self-pay | Admitting: *Deleted

## 2013-07-02 ENCOUNTER — Telehealth: Payer: Self-pay | Admitting: Hematology & Oncology

## 2013-07-02 MED ORDER — RIVAROXABAN 15 MG PO TABS: 15.0000 mg | ORAL_TABLET | Freq: Every day | ORAL | Status: DC

## 2013-07-02 NOTE — Discharge Summary (Signed)
Family Medicine Teaching Service  Discharge Note : Attending Quin Mcpherson MD Pager [phone] Office [phone] I have seen and examined this patient, reviewed their chart and discussed discharge planning wit the resident at the time of discharge. I agree with the discharge plan as above.  

## 2013-07-02 NOTE — Telephone Encounter (Signed)
Pt got iron in hospital, Andrew [name] will let me know if 5-11 follow up still ok.

## 2013-07-03 ENCOUNTER — Telehealth: Payer: Self-pay | Admitting: Nurse Practitioner

## 2013-07-03 NOTE — Telephone Encounter (Addendum)
Message copied by PICKENPACK-COUSAR, ATHEGlee ArvinNA N on Tue Jul 03, 2013  3:04 PM ------      Message from: Arlan OrganENNEVER, PETER R      Created: Mon Jun 25, 2013  5:22 PM       Please call and let his wife know that his iron is on the low side. He needs one dose of Feraheme 1020 mg x1 dose. Please set this up for this week or next. Thanks. [name] ------Pt was recently discharged from the hospital and received IV iron during his admission. He will keep his upcoming appointment with Dr. Myna HidalgoEnnever as scheduled.

## 2013-07-04 ENCOUNTER — Ambulatory Visit (INDEPENDENT_AMBULATORY_CARE_PROVIDER_SITE_OTHER): Payer: Medicare Other | Admitting: Emergency Medicine

## 2013-07-04 VITALS — BP 148/64 | HR 71 | Temp 97.9°F | Resp 18 | Ht 69.75 in | Wt 188.8 lb

## 2013-07-04 DIAGNOSIS — M549 Dorsalgia, unspecified: Secondary | ICD-10-CM

## 2013-07-04 DIAGNOSIS — R3911 Hesitancy of micturition: Secondary | ICD-10-CM

## 2013-07-04 DIAGNOSIS — J449 Chronic obstructive pulmonary disease, unspecified: Secondary | ICD-10-CM

## 2013-07-04 DIAGNOSIS — R300 Dysuria: Secondary | ICD-10-CM

## 2013-07-04 LAB — POCT UA - MICROSCOPIC ONLY
Bacteria, U Microscopic: NEGATIVE
Casts, Ur, LPF, POC: NEGATIVE
Crystals, Ur, HPF, POC: NEGATIVE
Mucus, UA: NEGATIVE
YEAST UA: NEGATIVE

## 2013-07-04 LAB — POCT URINALYSIS DIPSTICK
Bilirubin, UA: NEGATIVE
Glucose, UA: NEGATIVE
Ketones, UA: NEGATIVE
Leukocytes, UA: NEGATIVE
NITRITE UA: NEGATIVE
PH UA: 7
Protein, UA: 100
Spec Grav, UA: 1.015
UROBILINOGEN UA: 0.2

## 2013-07-04 MED ORDER — POTASSIUM CHLORIDE CRYS ER 10 MEQ PO TBCR: 10.0000 meq | EXTENDED_RELEASE_TABLET | Freq: Every day | ORAL | Status: DC

## 2013-07-04 MED ORDER — ESOMEPRAZOLE MAGNESIUM 40 MG PO CPDR: DELAYED_RELEASE_CAPSULE | ORAL | Status: DC

## 2013-07-04 MED ORDER — FLUTICASONE PROPIONATE 50 MCG/ACT NA SUSP: 2.0000 | NASAL | Status: DC | PRN

## 2013-07-04 MED ORDER — METHOCARBAMOL 500 MG PO TABS: 500.0000 mg | ORAL_TABLET | Freq: Three times a day (TID) | ORAL | Status: DC | PRN

## 2013-07-04 NOTE — Progress Notes (Signed)
Subjective:    Patient ID: Andrew [name], male    DOB: [birth year], 80 y.o.   MRN: [id number] This chart was scribed for [name], MD by Valera Castle, ED Scribe. This patient was seen in room 02 and the patient's care was started at 1:30 PM.  Chief Complaint  Patient presents with  . Follow-up    hospital visit  . Hematuria    this am   HPI Andrew [name] is a 80 y.o. male Pt presents to the [facility] for a follow up of a catheterization, hospital visit 1 week ago. He reports dysuria onset since discharge from the hospital, with associated hematuria this morning. He reports being given antibiotics at the hospital, but denies having been given a Rx for antibiotics. He denies any other associated symptoms.   PCP - Lucilla Edin, MD  Patient Active Problem List   Diagnosis Date Noted  . Urinary retention 06/26/2013  . COPD exacerbation 06/25/2013  . Iron deficiency anemia, unspecified 03/20/2013  . S/P biventricular cardiac pacemaker-[employer] 06/17/2011  . Atrial fibrillation 04/06/2011  . Coronary atherosclerosis of native coronary artery 01/15/2011  . Hyperlipidemia 11/26/2010  . Paroxysmal ventricular tachycardia 11/05/2010  . Ischemic cardiomyopathy 10/14/2010  . Atrial flutter   . Chronic systolic heart failure   . Hypertension   . Nonspecific (abnormal) findings on radiological and other examination of body structure 01/12/2010  . ABNORMAL LUNG XRAY 01/12/2010  . DYSPNEA 12/02/2009  . LIVER FUNCTION TESTS, ABNORMAL, HX OF 12/02/2009  . UNSPECIFIED ANEMIA 05/05/2009  . GERD 04/04/2007  . PERSONAL HISTORY OF COLONIC POLYPS 04/04/2007   Prior to Admission medications   Medication Sig Start Date End Date Taking? Authorizing Provider  albuterol (PROVENTIL HFA;VENTOLIN HFA) 108 (90 BASE) MCG/ACT inhaler Inhale 1 puff into the lungs every 6 (six) hours as needed for wheezing or shortness of breath.   Yes Historical Provider, MD  albuterol (PROVENTIL) (2.5 MG/3ML) 0.083%  nebulizer solution Take 3 mLs (2.5 mg total) by nebulization every 6 (six) hours as needed for wheezing. 10/02/12  Yes Eleanore Delia Chimes, PA-C  ALPRAZolam (XANAX) 0.25 MG tablet Take 0.25 mg by mouth daily as needed. For anxiety. 06/23/10  Yes Roger Shelter, MD  carvedilol (COREG) 25 MG tablet Take 25 mg by mouth 2 (two) times daily with a meal.   Yes Historical Provider, MD  esomeprazole (NEXIUM) 40 MG capsule Take 1 capsule (40 mg total) by mouth 2 (two) times daily. PATIENT NEEDS OFFICE VISIT FOR ADDITIONAL REFILLS 11/13/12  Yes [name], MD  Fluticasone-Salmeterol (ADVAIR DISKUS) 250-50 MCG/DOSE AEPB Inhale 1 puff into the lungs 2 (two) times daily. 09/25/12  Yes Eleanore Delia Chimes, PA-C  furosemide (LASIX) 40 MG tablet Take 20 mg by mouth 3 (three) times daily.    Yes Historical Provider, MD  hydrALAZINE (APRESOLINE) 25 MG tablet Take 12.5 mg by mouth 3 (three) times daily. Prn 09/25/12  Yes Rollene Rotunda, MD  HYDROcodone-acetaminophen (NORCO/VICODIN) 5-325 MG per tablet Take 1 tablet by mouth every 6 (six) hours. Arthritis pain   Yes Historical Provider, MD  ipratropium (ATROVENT) 0.02 % nebulizer solution Take 2.5 mLs (500 mcg total) by nebulization 4 (four) times daily. 10/02/12  Yes Eleanore E Egan, PA-C  losartan (COZAAR) 50 MG tablet Take 50 mg by mouth 2 (two) times daily.   Yes Historical Provider, MD  nitroGLYCERIN (NITROSTAT) 0.4 MG SL tablet Place 0.4 mg under the tongue every 5 (five) minutes as needed for chest pain.  Yes Historical Provider, MD  potassium chloride (KLOR-CON M10) 10 MEQ tablet Take 10 mEq by mouth daily.  07/11/12  Yes Rollene RotundaJames Hochrein, MD  pyridOXINE (VITAMIN B-6) 100 MG tablet Take 100 mg by mouth daily.   Yes Historical Provider, MD  Rivaroxaban (XARELTO) 15 MG TABS tablet Take 1 tablet (15 mg total) by mouth daily with supper. 07/02/13  Yes Rollene RotundaJames Hochrein, MD  Tamsulosin HCl (FLOMAX) 0.4 MG CAPS Take 0.4 mg by mouth 2 (two) times daily.    Yes Historical Provider, MD    fluticasone (FLONASE) 50 MCG/ACT nasal spray Place 2 sprays into the nose as needed for allergies.  06/19/12 06/25/13  Eleanore Delia ChimesE Egan, PA-C  predniSONE (DELTASONE) 20 MG tablet Take 2 tablets (40 mg total) by mouth daily with breakfast. 06/27/13   Tawni CarnesAndrew Wight, MD   Review of Systems  Constitutional: Negative for fever.  Gastrointestinal: Negative for nausea and vomiting.  Genitourinary: Positive for dysuria. Negative for hematuria.   BP 148/64  Pulse 71  Temp(Src) 97.9 F (36.6 C) (Oral)  Resp 18  Ht 5' 9.75" (1.772 m)  Wt 188 lb 12.8 oz (85.639 kg)  BMI 27.27 kg/m2  SpO2 91%     Objective:   Physical Exam  Nursing note and vitals reviewed. Constitutional: He is oriented to person, place, and time. He appears well-developed and well-nourished. No distress.  HENT:  Head: Normocephalic and atraumatic.  Eyes: EOM are normal.  Neck: Neck supple.  Cardiovascular: Normal rate, regular rhythm and normal heart sounds.   No murmur heard. Pulmonary/Chest: Effort normal. No respiratory distress. He has no wheezes. He has no rales. He exhibits no tenderness.  Decreased breath sounds in both bases.   Abdominal: Soft. There is tenderness (suprapubic area).  No definite bladder distention.   Musculoskeletal: Normal range of motion.  Neurological: He is alert and oriented to person, place, and time.  Skin: Skin is warm and dry.  Psychiatric: He has a normal mood and affect. His behavior is normal.   Results for orders placed in visit on 07/04/13  POCT UA - MICROSCOPIC ONLY      Result Value Ref Range   WBC, Ur, HPF, POC 1-3     RBC, urine, microscopic 0-5     Bacteria, U Microscopic neg     Mucus, UA neg     Epithelial cells, urine per micros 0-2     Crystals, Ur, HPF, POC neg     Casts, Ur, LPF, POC neg     Yeast, UA neg    POCT URINALYSIS DIPSTICK      Result Value Ref Range   Color, UA yellow     Clarity, UA clear     Glucose, UA neg     Bilirubin, UA neg     Ketones, UA  neg     Spec Grav, UA 1.015     Blood, UA trace     pH, UA 7.0     Protein, UA 100     Urobilinogen, UA 0.2     Nitrite, UA neg     Leukocytes, UA Negative        Assessment & Plan:   Patient having urinary symptoms post catheterization. No evidence of urinary tract infection. I did not change his medication but advised him to call the urologist see about other options for treatment. I did give him a muscle relaxant because of some muscle cramps he has had recently in his lumbar spine.  I personally performed  the services described in this documentation, which was scribed in my presence. The recorded information has been reviewed and is accurate.

## 2013-07-05 ENCOUNTER — Telehealth: Payer: Self-pay

## 2013-07-05 MED ORDER — ESOMEPRAZOLE MAGNESIUM 40 MG PO CPDR: DELAYED_RELEASE_CAPSULE | ORAL | Status: DC

## 2013-07-05 NOTE — Telephone Encounter (Signed)
pts states that he was seen yesterday by Dr.daub, he states that the [facility],  instead of [facility] like the rest of his medications. Best# [phone]

## 2013-07-05 NOTE — Telephone Encounter (Signed)
[facility]

## 2013-07-06 LAB — URINE CULTURE
Colony Count: NO GROWTH
Organism ID, Bacteria: NO GROWTH

## 2013-07-30 ENCOUNTER — Ambulatory Visit: Payer: Medicare Other | Admitting: Nurse Practitioner

## 2013-08-09 ENCOUNTER — Other Ambulatory Visit: Payer: Self-pay | Admitting: Cardiology

## 2013-08-10 ENCOUNTER — Ambulatory Visit (INDEPENDENT_AMBULATORY_CARE_PROVIDER_SITE_OTHER): Payer: Medicare Other | Admitting: Nurse Practitioner

## 2013-08-10 ENCOUNTER — Encounter: Payer: Self-pay | Admitting: Nurse Practitioner

## 2013-08-10 VITALS — BP 150/70 | HR 66 | Ht 70.5 in | Wt 189.0 lb

## 2013-08-10 DIAGNOSIS — I5022 Chronic systolic (congestive) heart failure: Secondary | ICD-10-CM

## 2013-08-10 DIAGNOSIS — Z79899 Other long term (current) drug therapy: Secondary | ICD-10-CM

## 2013-08-10 DIAGNOSIS — I255 Ischemic cardiomyopathy: Secondary | ICD-10-CM

## 2013-08-10 DIAGNOSIS — I2589 Other forms of chronic ischemic heart disease: Secondary | ICD-10-CM

## 2013-08-10 LAB — BASIC METABOLIC PANEL
BUN: 18 mg/dL (ref 6–23)
CO2: 29 mEq/L (ref 19–32)
Calcium: 9 mg/dL (ref 8.4–10.5)
Chloride: 100 mEq/L (ref 96–112)
Creatinine, Ser: 1.4 mg/dL (ref 0.4–1.5)
GFR: 51.25 mL/min — ABNORMAL LOW (ref >60.00)
Glucose, Bld: 94 mg/dL (ref 70–99)
Potassium: 4.1 mEq/L (ref 3.5–5.1)
Sodium: 135 mEq/L (ref 135–145)

## 2013-08-10 LAB — CBC
HCT: 36.3 % — ABNORMAL LOW (ref 39.0–52.0)
Hemoglobin: 12.2 g/dL — ABNORMAL LOW (ref 13.0–17.0)
MCHC: 33.6 g/dL (ref 30.0–36.0)
MCV: 95.4 fl (ref 78.0–100.0)
Platelets: 238 10*3/uL (ref 150.0–400.0)
RBC: 3.8 Mil/uL — ABNORMAL LOW (ref 4.22–5.81)
RDW: 14.7 % (ref 11.5–15.5)
WBC: 8.2 10*3/uL (ref 4.0–10.5)

## 2013-08-10 MED ORDER — RIVAROXABAN 20 MG PO TABS: 20.0000 mg | ORAL_TABLET | Freq: Every day | ORAL | Status: DC

## 2013-08-10 NOTE — Patient Instructions (Addendum)
Stay on your current medicines  We will go back to the 20 mg of Xarelto - this has been sent to your mail order  Check labs today  See me in 3 months  [facility] office at [phone] if you have any questions, problems or concerns.

## 2013-08-10 NOTE — Progress Notes (Signed)
Andrew [name] Date of Birth: October 27, 1930 Medical Record #[id number]  History of Present Illness: [name] is seen back today for a follow up visit. Seen for Dr. Zannie [name]/Andrew [name]. This is a 3 month check.  He has a very complex medical history which includes chronic systolic heart failure. Now with BiV device in place. On Xarelto for his atrial flutter and off of amiodarone due to increased LFT's in the remote past. Has COPD. On oxygen only at night. Has known CAD with remote MIs and multiple stents. EF is 15% per echo back in 2012.   Other issues include HTN, HLD, CRI, iron deficiency anemia - receiving IV iron. Was admitted back earlier in 2014 with sepsis - had ampicillin sensitive enterococcal bacteremia. His TEE was negative for a vegetation on his device.   Last seen by me in January of 2015. Seemed to be holding his own.   Was admitted at the end of March with COPD exacerbation and urinary retention. Xarelto dose was cut back due to his kidney function - has now normalized.   Comes back today. Here alone. Notes more issues with his back since he was in the hospital. On narcotic and muscle relaxer. No falls. No chest pain. Not dizzy. No palpitations. Having trouble getting his Xarelto RX refilled. Sees Dr. Myna [name] on Monday for possible iron infusion. Weight stable at home. Breathing stable. On home oxygen at night. Some swelling in his ankles - this is chronic. BP higher in the afternoons. He gets a full dose of Hydralazine every day - sometimes needs the extra 1/2. Restricts his salt. Balance is ok.   Current Outpatient Prescriptions  Medication Sig Dispense Refill  . albuterol (PROVENTIL HFA;VENTOLIN HFA) 108 (90 BASE) MCG/ACT inhaler Inhale 1 puff into the lungs every 6 (six) hours as needed for wheezing or shortness of breath.      [name]. albuterol (PROVENTIL) (2.5 MG/3ML) 0.083% nebulizer solution Take 3 mLs (2.5 mg total) by nebulization every 6 (six) hours as needed for wheezing.  75 mL   12  . ALPRAZolam (XANAX) 0.25 MG tablet Take 0.25 mg by mouth daily as needed. For anxiety.      . carvedilol (COREG) 25 MG tablet Take 25 mg by mouth 2 (two) times daily with a meal.      . esomeprazole (NEXIUM) 40 MG capsule Take twice a day  180 capsule  3  . fluticasone (FLONASE) 50 MCG/ACT nasal spray Place 2 sprays into both nostrils as needed for allergies.  16 g  11  . Fluticasone-Salmeterol (ADVAIR DISKUS) 250-50 MCG/DOSE AEPB Inhale 1 puff into the lungs 2 (two) times daily.  3 each  3  . furosemide (LASIX) 40 MG tablet TAKE AS DIRECTED  135 tablet  3  . hydrALAZINE (APRESOLINE) 25 MG tablet Take 12.5 mg by mouth 3 (three) times daily. Prn      . HYDROcodone-acetaminophen (NORCO/VICODIN) 5-325 MG per tablet Take 1 tablet by mouth every 6 (six) hours. Arthritis pain      . ipratropium (ATROVENT) 0.02 % nebulizer solution Take 2.5 mLs (500 mcg total) by nebulization 4 (four) times daily.  75 mL  12  . losartan (COZAAR) 50 MG tablet Take 50 mg by mouth 2 (two) times daily.      . methocarbamol (ROBAXIN) 500 MG tablet Take 1 tablet (500 mg total) by mouth every 8 (eight) hours as needed for muscle spasms.  30 tablet  1  . nitroGLYCERIN (NITROSTAT) 0.4 MG SL tablet Place  0.4 mg under the tongue every 5 (five) minutes as needed for chest pain.      . potassium chloride (KLOR-CON M10) 10 MEQ tablet Take 1 tablet (10 mEq total) by mouth daily.  90 tablet  3  . Rivaroxaban (XARELTO) 15 MG TABS tablet Take 1 tablet (15 mg total) by mouth daily with supper.  90 tablet  0  . Tamsulosin HCl (FLOMAX) 0.4 MG CAPS Take 0.4 mg by mouth 2 (two) times daily.       . [DISCONTINUED] metoprolol tartrate (LOPRESSOR) 25 MG tablet Take 1/2 tablet every other day   45 tablet  3   No current facility-administered medications for this visit.    Allergies  Allergen Reactions  . Codeine Nausea And Vomiting  . Inspra [Eplerenone] Other (See Comments)    Stomach problems  . Spironolactone Other (See Comments)      Gynecomastia  . Doxycycline Itching and Rash  . Sulfonamide Derivatives Itching and Rash    Past Medical History  Diagnosis Date  . Chronic systolic heart failure     a. Chronic LV systolic failure. b. Echo in Dec 2011 showed EF of 25% - f/u echo 02/2011: Mild LVH, EF 15%, posterior lateral akinesis, inferior akinesis, grade 1 diastolic dysfunction, mild LAE. c. s/p BiV pacemaker 06/2011.  [name]. Atrial flutter     a. Off amiodarone because of increased LFTs  . COPD (chronic obstructive pulmonary disease)     Chronic SOB, O2 dependent  . Myocardial infarction 1994  . Gastro - esophageal reflux   . Claudication   . BPH (benign prostatic hyperplasia)   . Coronary artery disease     a. Myocardial infarction in 1984,  99 and 07. b. H/o multiple RCA stents. c.  LHC 04/2011: without obstructive disease. oLM 25%, mLAD 30%, pCFX 25%, prox to mid RCA stent ok, dRCA 30% and 70%, pPDA 25%, mPL 25-30%, EF 25%.  [name]. Hypertension   . Atrial fibrillation     a. On Xarelto.  . Hemorrhoids   . Hyperlipidemia     statin intolerant 2/2 myalgias  . Hx of adenomatous colonic polyps   . Hx of colonoscopy   . Chronic renal insufficiency   . Biventricular cardiac pacemaker in situ 3.2013  . NSVT (nonsustained ventricular tachycardia)     a. 04/2012- 6 beats on tele.  [name]. Shortness of breath   . Arthritis     hips ,back ,hands  . Iron deficiency anemia, unspecified 03/20/2013    Past Surgical History  Procedure Laterality Date  . Cardiac catheterization  2008  . Cataract extraction    . Salivary gland surgery    . Coronary angioplasty with stent placement    . Insert / replace / remove pacemaker  06/16/2011  . Tee without cardioversion  05/08/2012    Procedure: TRANSESOPHAGEAL ECHOCARDIOGRAM (TEE);  Surgeon: Vesta MixerPhilip J Nahser, MD;  [facility];  Service: Cardiovascular;  Laterality: N/A;    History  Smoking status  . Former Smoker -- 1.50 packs/day for 55 years  . Types: Cigarettes  . Start  date: 05/21/1954  . Quit date: 01/03/2009  Smokeless tobacco  . Never Used    Comment: quit 4 years ago    History  Alcohol Use No    Comment: occasional    Family History  Problem Relation Age of Onset  . Coronary artery disease Father   . Heart disease Mother   . Colon cancer Neg Hx   . Heart disease Brother  Review of Systems: The review of systems is per the HPI.  All other systems were reviewed and are negative.  Physical Exam: BP 150/70  Pulse 66  Ht 5' 10.5" (1.791 m)  Wt 189 lb (85.73 kg)  BMI 26.73 kg/m2  SpO2 97% Patient is very pleasant and in no acute distress. He does look chronically ill. Skin is warm and dry. Color is normal.  HEENT is unremarkable. Normocephalic/atraumatic. PERRL. Sclera are nonicteric. Neck is supple. No masses. No JVD. Lungs are clear. Cardiac exam shows a fairly regular rate and rhythm. Abdomen is soft. Extremities are with trace edema. Multiple varicosities. Gait and ROM are intact. No gross neurologic deficits noted.  Wt Readings from Last 3 Encounters:  08/10/13 189 lb (85.73 kg)  07/04/13 188 lb 12.8 oz (85.639 kg)  06/27/13 187 lb 2.7 oz (84.9 kg)     LABORATORY DATA: PENDING Lab Results  Component Value Date   WBC 15.4* 06/27/2013   HGB 11.4* 06/27/2013   HCT 33.1* 06/27/2013   PLT 292 06/27/2013   GLUCOSE 155* 06/27/2013   CHOL 184 05/02/2013   TRIG 243.0* 05/02/2013   HDL 29.50* 05/02/2013   LDLDIRECT 119.4 05/02/2013   LDLCALC 83 03/17/2012   ALT 11 05/02/2013   AST 14 05/02/2013   NA 135* 06/27/2013   K 4.0 06/27/2013   CL 100 06/27/2013   CREATININE 1.28 06/27/2013   BUN 38* 06/27/2013   CO2 20 06/27/2013   TSH 4.286 08/09/2011   INR 1.77* 05/03/2012   HGBA1C 6.0* 03/10/2011     Assessment / Plan: 1. Systolic HF - looks compensated -  no change in his current regimen.   2. COPD - with past use of amiodarone - admitted for exacerbation at the end of March - now improved.    3. CAD - managed medically - no symptoms  reported.   4. Iron deficiency anemia - continues with IV iron infusions. Sees Dr. Myna Hidalgo  5. BiV PPM - followed by Dr. Graciela Husbands  6. History of atrial flutter/fib/VT - no longer on amiodarone  7. Chronic anticoagulation  8. Back pain - seems to be his most limiting factor at this time.   He is doing ok. I will see him in about 3 months. Check labs today. Continue with current regimen. Discussed his anticoagulation with the pharmacist  - we both calculated CrCl over 50 - will place back on his 20 mg of Xarelto.   Patient is agreeable to this plan and will call if any problems develop in the interim.   Rosalio Macadamia, RN, ANP-C [facility] [address] [place], [place]  [address] [phone]

## 2013-08-13 ENCOUNTER — Other Ambulatory Visit (HOSPITAL_BASED_OUTPATIENT_CLINIC_OR_DEPARTMENT_OTHER): Payer: Medicare Other | Admitting: Lab

## 2013-08-13 ENCOUNTER — Other Ambulatory Visit: Payer: Self-pay | Admitting: Nurse Practitioner

## 2013-08-13 ENCOUNTER — Ambulatory Visit (HOSPITAL_BASED_OUTPATIENT_CLINIC_OR_DEPARTMENT_OTHER): Payer: Medicare Other | Admitting: Hematology & Oncology

## 2013-08-13 DIAGNOSIS — D509 Iron deficiency anemia, unspecified: Secondary | ICD-10-CM

## 2013-08-13 LAB — CBC WITH DIFFERENTIAL (CANCER CENTER ONLY)
BASO#: 0 10*3/uL (ref 0.0–0.2)
BASO%: 0.4 % (ref 0.0–2.0)
EOS%: 2.1 % (ref 0.0–7.0)
Eosinophils Absolute: 0.2 10*3/uL (ref 0.0–0.5)
HCT: 37.4 % — ABNORMAL LOW (ref 38.7–49.9)
HGB: 12.2 g/dL — ABNORMAL LOW (ref 13.0–17.1)
LYMPH#: 0.9 10*3/uL (ref 0.9–3.3)
LYMPH%: 12.5 % — ABNORMAL LOW (ref 14.0–48.0)
MCH: 32.1 pg (ref 28.0–33.4)
MCHC: 32.6 g/dL (ref 32.0–35.9)
MCV: 98 fL (ref 82–98)
MONO#: 0.6 10*3/uL (ref 0.1–0.9)
MONO%: 7.8 % (ref 0.0–13.0)
NEUT%: 77.2 % (ref 40.0–80.0)
NEUTROS ABS: 5.5 10*3/uL (ref 1.5–6.5)
Platelets: 223 10*3/uL (ref 145–400)
RBC: 3.8 10*6/uL — AB (ref 4.20–5.70)
RDW: 13.8 % (ref 11.1–15.7)
WBC: 7.1 10*3/uL (ref 4.0–10.0)

## 2013-08-13 LAB — COMPREHENSIVE METABOLIC PANEL
ALBUMIN: 3.8 g/dL (ref 3.5–5.2)
AST: 14 U/L (ref 0–37)
Alkaline Phosphatase: 70 U/L (ref 39–117)
BUN: 18 mg/dL (ref 6–23)
CALCIUM: 8.6 mg/dL (ref 8.4–10.5)
CHLORIDE: 101 meq/L (ref 96–112)
CO2: 29 mEq/L (ref 19–32)
Creatinine, Ser: 1.33 mg/dL (ref 0.50–1.35)
Glucose, Bld: 121 mg/dL — ABNORMAL HIGH (ref 70–99)
POTASSIUM: 4.4 meq/L (ref 3.5–5.3)
SODIUM: 137 meq/L (ref 135–145)
TOTAL PROTEIN: 6.5 g/dL (ref 6.0–8.3)
Total Bilirubin: 0.6 mg/dL (ref 0.2–1.2)

## 2013-08-13 LAB — IRON AND TIBC CHCC
%SAT: 37 % (ref 20–55)
Iron: 58 ug/dL (ref 42–163)
TIBC: 156 ug/dL — ABNORMAL LOW (ref 202–409)
UIBC: 98 ug/dL — ABNORMAL LOW (ref 117–376)

## 2013-08-13 LAB — RETICULOCYTES (CHCC)
ABS Retic: 41.6 10*3/uL (ref 19.0–186.0)
RBC.: 3.78 MIL/uL — AB (ref 4.22–5.81)
Retic Ct Pct: 1.1 % (ref 0.4–2.3)

## 2013-08-13 LAB — FERRITIN CHCC: Ferritin: 417 ng/ml — ABNORMAL HIGH (ref 22–316)

## 2013-08-13 NOTE — Progress Notes (Signed)
Hematology and Oncology Follow Up Visit  Andrew [name] [id number] 1931-03-27 [age] y.o. 08/13/2013   Principle Diagnosis:    Iron deficiency anemia  Erythropoitin deficiency  Current Therapy:    IV iron as indicated     Interim History:  Andrew [name] is back for followup. We last saw him back in February. He was admitted to the hospital in March. He iron back then.  He is doing okay. He does feel tired. He has low back issues. His leg issues. It seems like nothing to be done for the back.  He's had no bleeding. He's had no fever. He's had no nausea vomiting. He's had no leg swelling.  He continues on all of his medications.  Medications: Current outpatient prescriptions:albuterol (PROVENTIL HFA;VENTOLIN HFA) 108 (90 BASE) MCG/ACT inhaler, Inhale 1 puff into the lungs every 6 (six) hours as needed for wheezing or shortness of breath., Disp: , Rfl: ;  albuterol (PROVENTIL) (2.5 MG/3ML) 0.083% nebulizer solution, Take 3 mLs (2.5 mg total) by nebulization every 6 (six) hours as needed for wheezing., Disp: 75 mL, Rfl: 12 ALPRAZolam (XANAX) 0.25 MG tablet, Take 0.25 mg by mouth daily as needed. For anxiety., Disp: , Rfl: ;  carvedilol (COREG) 25 MG tablet, Take 25 mg by mouth 2 (two) times daily with a meal., Disp: , Rfl: ;  esomeprazole (NEXIUM) 40 MG capsule, Take twice a day, Disp: 180 capsule, Rfl: 3;  fluticasone (FLONASE) 50 MCG/ACT nasal spray, Place 2 sprays into both nostrils as needed for allergies., Disp: 16 g, Rfl: 11 Fluticasone-Salmeterol (ADVAIR DISKUS) 250-50 MCG/DOSE AEPB, Inhale 1 puff into the lungs 2 (two) times daily., Disp: 3 each, Rfl: 3;  furosemide (LASIX) 40 MG tablet, TAKE AS DIRECTED, Disp: 135 tablet, Rfl: 3;  hydrALAZINE (APRESOLINE) 25 MG tablet, Take 12.5 mg by mouth 3 (three) times daily. Prn, Disp: , Rfl:  HYDROcodone-acetaminophen (NORCO/VICODIN) 5-325 MG per tablet, Take 1 tablet by mouth every 6 (six) hours. Arthritis pain, Disp: , Rfl: ;  ipratropium (ATROVENT)  0.02 % nebulizer solution, Take 2.5 mLs (500 mcg total) by nebulization 4 (four) times daily., Disp: 75 mL, Rfl: 12;  losartan (COZAAR) 50 MG tablet, Take 50 mg by mouth 2 (two) times daily., Disp: , Rfl:  methocarbamol (ROBAXIN) 500 MG tablet, Take 1 tablet (500 mg total) by mouth every 8 (eight) hours as needed for muscle spasms., Disp: 30 tablet, Rfl: 1;  nitroGLYCERIN (NITROSTAT) 0.4 MG SL tablet, Place 0.4 mg under the tongue every 5 (five) minutes as needed for chest pain., Disp: , Rfl: ;  potassium chloride (KLOR-CON M10) 10 MEQ tablet, Take 1 tablet (10 mEq total) by mouth daily., Disp: 90 tablet, Rfl: 3 rivaroxaban (XARELTO) 20 MG TABS tablet, Take 1 tablet (20 mg total) by mouth daily with supper., Disp: 90 tablet, Rfl: 3;  Tamsulosin HCl (FLOMAX) 0.4 MG CAPS, Take 0.4 mg by mouth 2 (two) times daily. , Disp: , Rfl: ;  [DISCONTINUED] metoprolol tartrate (LOPRESSOR) 25 MG tablet, Take 1/2 tablet every other day , Disp: 45 tablet, Rfl: 3  Allergies:  Allergies  Allergen Reactions  . Codeine Nausea And Vomiting  . Inspra [Eplerenone] Other (See Comments)    Stomach problems  . Spironolactone Other (See Comments)    Gynecomastia  . Doxycycline Itching and Rash  . Sulfonamide Derivatives Itching and Rash    Past Medical History, Surgical history, Social history, and Family History were reviewed and updated.  Review of Systems: As above  Physical Exam:  vitals were not taken for this visit.  Elderly white woman in no obvious distress. Vital signs are temperature of 97.5. Pulse 60. Blood pressure 130/64. Weight is 189 pounds. Head and neck exam shows no ocular or oral lesions. There are no palpable cervical or supraclavicular lymph nodes. Lungs are clear. Cardiac exam regular rate and rhythm with no murmurs rubs or bruits. Abdomen is soft. Has good bowel sounds. There is no fluid wave. There is a palpable abdominal mass. Is a palpable hepato- splenomegaly. Exam no tenderness over the spine  ribs or hips. Extremity shows some chronic stasis dermatitis changes in his lower sternum is. He has 1+ edema in his lower extremities. He has 4/5 strength in his arms and legs. Skin exam no rashes.  Lab Results  Component Value Date   WBC 7.1 08/13/2013   HGB 12.2* 08/13/2013   HCT 37.4* 08/13/2013   MCV 98 08/13/2013   PLT 223 08/13/2013     Chemistry      Component Value Date/Time   NA 135 08/10/2013 1423   NA 136 01/17/2013 1337   K 4.1 08/10/2013 1423   K 4.5 01/17/2013 1337   CL 100 08/10/2013 1423   CL 97* 01/17/2013 1337   CO2 29 08/10/2013 1423   CO2 31 01/17/2013 1337   BUN 18 08/10/2013 1423   BUN 18 01/17/2013 1337   CREATININE 1.4 08/10/2013 1423   CREATININE 1.6* 01/17/2013 1337      Component Value Date/Time   CALCIUM 9.0 08/10/2013 1423   CALCIUM 8.9 01/17/2013 1337   ALKPHOS 70 05/02/2013 1449   AST 14 05/02/2013 1449   ALT 11 05/02/2013 1449   BILITOT 0.6 05/02/2013 1449         Impression and Plan: [name] is 80 year old gentleman. He has both iron deficiency and low erythropoietin level. He always responds to IV iron.  By his MCV being 98, I don't think that we have to get him on. I would be surprised if his iron level is low.  I think we have probably get him back in another couple months for followup.  I do think that he does not sure of iron. He may have intermittent bleeding but most of his problem is lack of absorption.   Josph MachoPeter R Ennever, MD 5/11/20151:47 PM

## 2013-08-15 ENCOUNTER — Encounter: Payer: Self-pay | Admitting: *Deleted

## 2013-08-16 ENCOUNTER — Telehealth: Payer: Self-pay | Admitting: Cardiology

## 2013-08-16 NOTE — Telephone Encounter (Signed)
New message     Pt is dizzy and have headaches.  Could it be from the xarelto?

## 2013-08-16 NOTE — Telephone Encounter (Signed)
Spoke with pt who reports for 2 to 3 days he has been having h/a, high blood pressure and dizziness.  He reports he is not dizzy with sitting but when he stands he becomes dizzy sometimes.  He has not tried anything for his h/a however he takes Vicodan up to TID for chronic pain.  This has not helped his h/a.  He states he is drinking plenty of fluids and does not feel like he is dehydrated.  States he has been outside a lot and wonders if it could be pollen.  He also questions if the dizziness could be coming from increasing his Xarelto at his last office visit 5/8.  Advised this increase would not cause dizziness.  He will take meds as rx.  Use tylenol for h/a and if it continues he will contact his PCP for f/u evaluation.  Pt is in agreement.

## 2013-08-17 ENCOUNTER — Ambulatory Visit (INDEPENDENT_AMBULATORY_CARE_PROVIDER_SITE_OTHER): Payer: Medicare Other | Admitting: Family Medicine

## 2013-08-17 VITALS — BP 122/76 | HR 73 | Temp 97.8°F | Resp 16 | Ht 69.0 in | Wt 187.6 lb

## 2013-08-17 DIAGNOSIS — H8110 Benign paroxysmal vertigo, unspecified ear: Secondary | ICD-10-CM

## 2013-08-17 MED ORDER — MECLIZINE HCL 25 MG PO TABS: ORAL_TABLET | ORAL | Status: DC

## 2013-08-17 NOTE — Progress Notes (Signed)
[facility] [address], [place] [address] [phone]  Date:  08/17/2013   Name:  Andrew [name]   DOB:  1930/05/08   MRN:  [id number]  PCP:  Lucilla Edin, MD    Chief Complaint: Dizziness and Nausea   History of Present Illness:  Andrew [name] is a 80 y.o. very pleasant male patient who presents with the following:  He has noticed dizziness for the last 3 or 4 days. He notices it more when he moves his head or gets up from a chair.  It is vertigo, not lightheadedness.  No recent falls or trauma.  The dizziness lasts only a few seconds Ears are not painful.  He has a litrle tinnitus at baseline He had some meclizine at home and tried it- however it is 78 years old "at least."  He was given the meclizine when he had similar sx in the past He did have a HA over the last couple of days- he took some tylenol yesterday evening.  He does take vicodin up to TID.  The headache is gone now.   He has noted a little nausea, but no vomiting.  He is a little "queasy," not severe.    He has not noted any slurred speech or numbness, weakness elsewhere.    Patient Active Problem List   Diagnosis Date Noted  . Urinary retention 06/26/2013  . COPD exacerbation 06/25/2013  . Iron deficiency anemia, unspecified 03/20/2013  . S/P biventricular cardiac pacemaker-[employer] 06/17/2011  . Atrial fibrillation 04/06/2011  . Coronary atherosclerosis of native coronary artery 01/15/2011  . Hyperlipidemia 11/26/2010  . Paroxysmal ventricular tachycardia 11/05/2010  . Ischemic cardiomyopathy 10/14/2010  . Atrial flutter   . Chronic systolic heart failure   . Hypertension   . Nonspecific (abnormal) findings on radiological and other examination of body structure 01/12/2010  . ABNORMAL LUNG XRAY 01/12/2010  . DYSPNEA 12/02/2009  . LIVER FUNCTION TESTS, ABNORMAL, HX OF 12/02/2009  . UNSPECIFIED ANEMIA 05/05/2009  . GERD 04/04/2007  . PERSONAL HISTORY OF COLONIC POLYPS  04/04/2007    Past Medical History  Diagnosis Date  . Chronic systolic heart failure     a. Chronic LV systolic failure. b. Echo in Dec 2011 showed EF of 25% - f/u echo 02/2011: Mild LVH, EF 15%, posterior lateral akinesis, inferior akinesis, grade 1 diastolic dysfunction, mild LAE. c. s/p BiV pacemaker 06/2011.  [name] Atrial flutter     a. Off amiodarone because of increased LFTs  . COPD (chronic obstructive pulmonary disease)     Chronic SOB, O2 dependent  . Myocardial infarction 1994  . Gastro - esophageal reflux   . Claudication   . BPH (benign prostatic hyperplasia)   . Coronary artery disease     a. Myocardial infarction in 1984,  99 and 07. b. H/o multiple RCA stents. c.  LHC 04/2011: without obstructive disease. oLM 25%, mLAD 30%, pCFX 25%, prox to mid RCA stent ok, dRCA 30% and 70%, pPDA 25%, mPL 25-30%, EF 25%.  [name] Hypertension   . Atrial fibrillation     a. On Xarelto.  . Hemorrhoids   . Hyperlipidemia     statin intolerant 2/2 myalgias  . Hx of adenomatous colonic polyps   . Hx of colonoscopy   . Chronic renal insufficiency   . Biventricular cardiac pacemaker in situ 3.2013  . NSVT (nonsustained ventricular tachycardia)     a. 04/2012- 6 beats on tele.  [name] Shortness of breath   .  Arthritis     hips ,back ,hands  . Iron deficiency anemia, unspecified 03/20/2013    Past Surgical History  Procedure Laterality Date  . Cardiac catheterization  2008  . Cataract extraction    . Salivary gland surgery    . Coronary angioplasty with stent placement    . Insert / replace / remove pacemaker  06/16/2011  . Tee without cardioversion  05/08/2012    Procedure: TRANSESOPHAGEAL ECHOCARDIOGRAM (TEE);  Surgeon: Vesta MixerPhilip J Nahser, MD;  [facility];  Service: Cardiovascular;  Laterality: N/A;    History  Substance Use Topics  . Smoking status: Former Smoker -- 1.50 packs/day for 55 years    Types: Cigarettes    Start date: 05/21/1954    Quit date: 01/03/2009  . Smokeless  tobacco: Never Used     Comment: quit 4 years ago  . Alcohol Use: No     Comment: occasional    Family History  Problem Relation Age of Onset  . Coronary artery disease Father   . Heart disease Mother   . Colon cancer Neg Hx   . Heart disease Brother     Allergies  Allergen Reactions  . Codeine Nausea And Vomiting  . Inspra [Eplerenone] Other (See Comments)    Stomach problems  . Spironolactone Other (See Comments)    Gynecomastia  . Doxycycline Itching and Rash  . Sulfonamide Derivatives Itching and Rash    Medication list has been reviewed and updated.  Current Outpatient Prescriptions on File Prior to Visit  Medication Sig Dispense Refill  . albuterol (PROVENTIL HFA;VENTOLIN HFA) 108 (90 BASE) MCG/ACT inhaler Inhale 1 puff into the lungs every 6 (six) hours as needed for wheezing or shortness of breath.      [name]. albuterol (PROVENTIL) (2.5 MG/3ML) 0.083% nebulizer solution Take 3 mLs (2.5 mg total) by nebulization every 6 (six) hours as needed for wheezing.  75 mL  12  . ALPRAZolam (XANAX) 0.25 MG tablet Take 0.25 mg by mouth daily as needed. For anxiety.      . carvedilol (COREG) 25 MG tablet Take 25 mg by mouth 2 (two) times daily with a meal.      . esomeprazole (NEXIUM) 40 MG capsule Take twice a day  180 capsule  3  . fluticasone (FLONASE) 50 MCG/ACT nasal spray Place 2 sprays into both nostrils as needed for allergies.  16 g  11  . Fluticasone-Salmeterol (ADVAIR DISKUS) 250-50 MCG/DOSE AEPB Inhale 1 puff into the lungs 2 (two) times daily.  3 each  3  . furosemide (LASIX) 40 MG tablet TAKE AS DIRECTED  135 tablet  3  . hydrALAZINE (APRESOLINE) 25 MG tablet Take 12.5 mg by mouth 3 (three) times daily. Prn      . HYDROcodone-acetaminophen (NORCO/VICODIN) 5-325 MG per tablet Take 1 tablet by mouth every 6 (six) hours. Arthritis pain      . ipratropium (ATROVENT) 0.02 % nebulizer solution Take 2.5 mLs (500 mcg total) by nebulization 4 (four) times daily.  75 mL  12  .  losartan (COZAAR) 50 MG tablet Take 50 mg by mouth 2 (two) times daily.      . methocarbamol (ROBAXIN) 500 MG tablet Take 1 tablet (500 mg total) by mouth every 8 (eight) hours as needed for muscle spasms.  30 tablet  1  . nitroGLYCERIN (NITROSTAT) 0.4 MG SL tablet Place 0.4 mg under the tongue every 5 (five) minutes as needed for chest pain.      . potassium chloride (  KLOR-CON M10) 10 MEQ tablet Take 1 tablet (10 mEq total) by mouth daily.  90 tablet  3  . rivaroxaban (XARELTO) 20 MG TABS tablet Take 1 tablet (20 mg total) by mouth daily with supper.  90 tablet  3  . Tamsulosin HCl (FLOMAX) 0.4 MG CAPS Take 0.4 mg by mouth 2 (two) times daily.       . [DISCONTINUED] metoprolol tartrate (LOPRESSOR) 25 MG tablet Take 1/2 tablet every other day   45 tablet  3   No current facility-administered medications on file prior to visit.    Review of Systems:  As per HPI- otherwise negative.   Physical Examination: Filed Vitals:   08/17/13 1405  BP: 122/76  Pulse: 73  Temp: 97.8 F (36.6 C)  Resp: 16   Filed Vitals:   08/17/13 1405  Height: 5\' 9"  (1.753 m)  Weight: 187 lb 9.6 oz (85.095 kg)   Body mass index is 27.69 kg/(m^2). Ideal Body Weight: Weight in (lb) to have BMI = 25: 168.9  GEN: WDWN, NAD, Non-toxic, A & O x 3, looks well.   HEENT: Atraumatic, Normocephalic. Neck supple. No masses, No LAD. TM wnl left, cerumen right oropharynx normal.  PEERL,EOMI.   Ears and Nose: No external deformity. CV: RRR, No M/G/R. No JVD. No thrill. No extra heart sounds. PULM: CTA B, no wheezes, crackles, rhonchi. No retractions. No resp. distress. No accessory muscle use. ABD: S, NT, ND, +BS. No rebound. No HSM. EXTR: No c/c/e NEURO Normal gait.  PSYCH: Normally interactive. Conversant. Not depressed or anxious appearing.  Calm demeanor.  Complete neuro exam is wnl including sensation and strength all extremities, romberg and gait  Assessment and Plan: BPPV (benign paroxysmal positional  vertigo) - Plan: meclizine (ANTIVERT) 25 MG tablet  Treat for BPPV with meclizine.  Offered a CT scan but he declines at this time.  Explained that meclizine can cause drowsiness, should be used with caution especially in order persons.  He has used it in the recent past without incident  Signed Abbe AmsterdamJessica Copland, MD

## 2013-08-17 NOTE — Patient Instructions (Signed)
Avoid taking the meclizine with robaxin or xanax.  Let me know if you do not feel better soon!  Remember the meclizine can make you feel a bit sleepy; don't use it when you need to drive.

## 2013-08-19 ENCOUNTER — Other Ambulatory Visit: Payer: Self-pay | Admitting: Physician Assistant

## 2013-08-24 ENCOUNTER — Telehealth: Payer: Self-pay

## 2013-08-24 NOTE — Telephone Encounter (Signed)
I added the shingles vaccination into his immunization hx so he should be able to see it in [place] now. I called and spoke with him.

## 2013-08-24 NOTE — Telephone Encounter (Signed)
Pt would like to have the shingles vaccination he had done at [facility] on Nov. 4th added to his Mychart, he sees Dr. Cleta Alberts, please advice him of what he needs to do to have that done.[phone]

## 2013-08-28 ENCOUNTER — Telehealth: Payer: Self-pay | Admitting: Cardiology

## 2013-08-28 ENCOUNTER — Ambulatory Visit (INDEPENDENT_AMBULATORY_CARE_PROVIDER_SITE_OTHER): Payer: Medicare Other | Admitting: *Deleted

## 2013-08-28 ENCOUNTER — Encounter: Payer: Self-pay | Admitting: Internal Medicine

## 2013-08-28 DIAGNOSIS — I472 Ventricular tachycardia, unspecified: Secondary | ICD-10-CM

## 2013-08-28 DIAGNOSIS — I255 Ischemic cardiomyopathy: Secondary | ICD-10-CM

## 2013-08-28 DIAGNOSIS — I4729 Other ventricular tachycardia: Secondary | ICD-10-CM

## 2013-08-28 DIAGNOSIS — I4891 Unspecified atrial fibrillation: Secondary | ICD-10-CM

## 2013-08-28 DIAGNOSIS — I4892 Unspecified atrial flutter: Secondary | ICD-10-CM

## 2013-08-28 DIAGNOSIS — I2589 Other forms of chronic ischemic heart disease: Secondary | ICD-10-CM

## 2013-08-28 LAB — MDC_IDC_ENUM_SESS_TYPE_REMOTE
Battery Remaining Longevity: 66 mo
Battery Voltage: 3.01 V
Brady Statistic AP VP Percent: 1.88 %
Lead Channel Impedance Value: 361 Ohm
Lead Channel Impedance Value: 399 Ohm
Lead Channel Impedance Value: 475 Ohm
Lead Channel Impedance Value: 722 Ohm
Lead Channel Impedance Value: 969 Ohm
Lead Channel Pacing Threshold Pulse Width: 0.4 ms
Lead Channel Sensing Intrinsic Amplitude: 2.625 mV
Lead Channel Sensing Intrinsic Amplitude: 9.875 mV
Lead Channel Setting Pacing Amplitude: 2 V
Lead Channel Setting Pacing Amplitude: 2 V
Lead Channel Setting Pacing Amplitude: 2.5 V
Lead Channel Setting Pacing Pulse Width: 0.4 ms
Lead Channel Setting Pacing Pulse Width: 0.4 ms
Lead Channel Setting Sensing Sensitivity: 0.9 mV
MDC IDC MSMT LEADCHNL LV IMPEDANCE VALUE: 1273 Ohm
MDC IDC MSMT LEADCHNL LV IMPEDANCE VALUE: 570 Ohm
MDC IDC MSMT LEADCHNL LV IMPEDANCE VALUE: 836 Ohm
MDC IDC MSMT LEADCHNL RA IMPEDANCE VALUE: 494 Ohm
MDC IDC MSMT LEADCHNL RA PACING THRESHOLD AMPLITUDE: 0.75 V
MDC IDC MSMT LEADCHNL RA PACING THRESHOLD PULSEWIDTH: 0.4 ms
MDC IDC MSMT LEADCHNL RV PACING THRESHOLD AMPLITUDE: 0.5 V
MDC IDC SESS DTM: 20150526134458
MDC IDC SET ZONE DETECTION INTERVAL: 350 ms
MDC IDC STAT BRADY AP VS PERCENT: 0 %
MDC IDC STAT BRADY AS VP PERCENT: 97.94 %
MDC IDC STAT BRADY AS VS PERCENT: 0.18 %
MDC IDC STAT BRADY RA PERCENT PACED: 1.88 %
MDC IDC STAT BRADY RV PERCENT PACED: 99.82 %
Zone Setting Detection Interval: 400 ms

## 2013-08-28 NOTE — Telephone Encounter (Signed)
Application For Renewal of Permanent Disability Parking Placard Completed By Dr.Hochrein Mailed to Pt Home Address in Envelope Provided   5.26.15/kdm

## 2013-08-28 NOTE — Progress Notes (Signed)
Remote pacemaker transmission.   

## 2013-09-01 ENCOUNTER — Telehealth: Payer: Self-pay | Admitting: Physician Assistant

## 2013-09-01 NOTE — Telephone Encounter (Signed)
    Patient called to report "missed beats" and pulses ~45bmp. He is usually paced around 70s. No lightheadedness or dizziness. BP good. He was worried pacemaker not functioning properly. Talked to Dr. Ladona Ridgel who was assured these were PVCs. If he is still worried they will interrogate on Monday at the office.   Thereasa Parkin PA-C  [facility]

## 2013-09-03 ENCOUNTER — Telehealth: Payer: Self-pay | Admitting: Internal Medicine

## 2013-09-03 NOTE — Telephone Encounter (Signed)
New Message:  Pt states he is calling back to speak to a nurse.. Pt spoke to our PA/Kathryn over the weekend (Notes in Epic).. Pt states he was told a nurse would be calling him. Pt is requesting to a call back

## 2013-09-03 NOTE — Telephone Encounter (Signed)
Patient states palpitations were more frequent over the past weekend.  He will send a transmission for review.

## 2013-09-03 NOTE — Telephone Encounter (Signed)
Follow up     Wife calling regarding previous am message.  Patient has question regarding difference in pulses rate,. Call on Saturday spoke with PA.    Patient is not in any distress, dizzy , no sob, no cp,

## 2013-09-05 ENCOUNTER — Telehealth: Payer: Self-pay

## 2013-09-05 NOTE — Telephone Encounter (Signed)
Needed order for overnight oximetry to see if pt needed it at night. But there was confusion in what the patient was needing when he called [facility]. Angelica Chessman stated to disregard this phone message.

## 2013-09-05 NOTE — Telephone Encounter (Signed)
Called Mandy back- LM for rtn call.   Need clarification on order needed. If Angelica Chessman would like to fax the order to Dr. Cleta Alberts he states he would sign it for the patient.

## 2013-09-05 NOTE — Telephone Encounter (Signed)
Mandy from [facility] needs order from Dr Cleta Alberts for O & O on room air for patient. Cb# [phone]

## 2013-09-11 ENCOUNTER — Telehealth: Payer: Self-pay | Admitting: *Deleted

## 2013-09-11 NOTE — Telephone Encounter (Signed)
Transmission received on 09/03/13. Report shows two short NSVT episodes both 1sec. Pt takes pulse manually and by a digital monitor at home. I explained his pulse rate is not accurate on those machines when having PVCs. Pt taking potassium as instructed. Although frustrated, pt also understands that PVCs would not cause a need for reprogramming.   Pt also having gradual increase in fluid retention; OptiVol increasing gradually. Pt has instructions from Dr. Antoine Poche if he has weight gain.

## 2013-09-12 ENCOUNTER — Ambulatory Visit (INDEPENDENT_AMBULATORY_CARE_PROVIDER_SITE_OTHER): Payer: Medicare Other | Admitting: Emergency Medicine

## 2013-09-12 VITALS — BP 142/62 | HR 40 | Temp 97.6°F | Resp 18 | Ht 70.0 in | Wt 194.0 lb

## 2013-09-12 DIAGNOSIS — J449 Chronic obstructive pulmonary disease, unspecified: Secondary | ICD-10-CM

## 2013-09-12 DIAGNOSIS — R0602 Shortness of breath: Secondary | ICD-10-CM

## 2013-09-12 DIAGNOSIS — I4949 Other premature depolarization: Secondary | ICD-10-CM

## 2013-09-12 DIAGNOSIS — I493 Ventricular premature depolarization: Secondary | ICD-10-CM

## 2013-09-12 LAB — BASIC METABOLIC PANEL
BUN: 17 mg/dL (ref 6–23)
CHLORIDE: 98 meq/L (ref 96–112)
CO2: 27 mEq/L (ref 19–32)
Calcium: 8.5 mg/dL (ref 8.4–10.5)
Creat: 1.33 mg/dL (ref 0.50–1.35)
GLUCOSE: 106 mg/dL — AB (ref 70–99)
POTASSIUM: 4.2 meq/L (ref 3.5–5.3)
SODIUM: 133 meq/L — AB (ref 135–145)

## 2013-09-12 LAB — POCT CBC
GRANULOCYTE PERCENT: 78.5 % (ref 37–80)
HEMATOCRIT: 37.1 % — AB (ref 43.5–53.7)
Hemoglobin: 11.7 g/dL — AB (ref 14.1–18.1)
LYMPH, POC: 1.1 (ref 0.6–3.4)
MCH, POC: 31 pg (ref 27–31.2)
MCHC: 31.5 g/dL — AB (ref 31.8–35.4)
MCV: 98.3 fL — AB (ref 80–97)
MID (cbc): 0.6 (ref 0–0.9)
MPV: 10.5 fL (ref 0–99.8)
POC GRANULOCYTE: 6.3 (ref 2–6.9)
POC LYMPH PERCENT: 14.1 %L (ref 10–50)
POC MID %: 7.4 % (ref 0–12)
Platelet Count, POC: 215 10*3/uL (ref 142–424)
RBC: 3.77 M/uL — AB (ref 4.69–6.13)
RDW, POC: 15.4 %
WBC: 8 10*3/uL (ref 4.6–10.2)

## 2013-09-12 LAB — MAGNESIUM: Magnesium: 1.9 mg/dL (ref 1.5–2.5)

## 2013-09-12 NOTE — Progress Notes (Addendum)
   Subjective:    Patient ID: Andrew [name], male    DOB: 1931/01/21, 80 y.o.   MRN: [id number]  HPI patient enters with a ten-day history of increasing shortness of breath not associated with chest pain. Patient has a dual-chamber pacer in. He was seen recently for followup with the cardiologist. He has not had increasing shortness of breath at night. He is on chronic O2 therapy at night. He gets fatigued very easily and can only walk approximately 20 feet without getting short of breath    Review of Systems     Objective:   Physical Exam patient is alert and cooperative he is not in any distress. Neck is supple. Chest exam reveals decreased breath sounds in the bases with poor air exchange. Cardiac exam reveals a bigeminal-type rhythm. Abdomen is soft extremities without edema  Results for orders placed in visit on 09/12/13  POCT CBC      Result Value Ref Range   WBC 8.0  4.6 - 10.2 K/uL   Lymph, poc 1.1  0.6 - 3.4   POC LYMPH PERCENT 14.1  10 - 50 %L   MID (cbc) 0.6  0 - 0.9   POC MID % 7.4  0 - 12 %M   POC Granulocyte 6.3  2 - 6.9   Granulocyte percent 78.5  37 - 80 %G   RBC 3.77 (*) 4.69 - 6.13 M/uL   Hemoglobin 11.7 (*) 14.1 - 18.1 g/dL   HCT, POC 93.7 (*) 90.2 - 53.7 %   MCV 98.3 (*) 80 - 97 fL   MCH, POC 31.0  27 - 31.2 pg   MCHC 31.5 (*) 31.8 - 35.4 g/dL   RDW, POC 40.9     Platelet Count, POC 215  142 - 424 K/uL   MPV 10.5  0 - 99.8 fL   EKG shows a bigeminal rhythm with every other beat a paced beat     Assessment & Plan:  I called and discussed with Dr. Graciela Husbands. They're going to get the patient in the next week. A baseline magnesium level and basic metabolic panel were drawn. The recorded heart rate on the pulse oximeter was 40 however his actual heart rate by rhythm strip is in the 70s . Every other beat is a PVC.

## 2013-09-14 ENCOUNTER — Telehealth: Payer: Self-pay | Admitting: Internal Medicine

## 2013-09-14 NOTE — Telephone Encounter (Signed)
Pt states he feels like he is having a problem with his CHF.  He feels like he is having trouble breathing and has some cough that is productive (white)  States his scales are broken and the last time he weighed his wt was up 3 lbs.  He reports edema on the tops of his feet and his wife states and increase in abdominal girth.  Today he has taken 120 mg a furosemide and is feeling alittle better.  His wife will go get a new bathroom scale today.  He will be weighing everyday (has been until his scales broke recently).  He is keeping his feet and legs elevated and watching his NA intake.  If his s/s worsen over the weekend he will call the MD on call.

## 2013-09-14 NOTE — Telephone Encounter (Signed)
Pt wants Dr.Daub to give him a call about his blood world, and if he has talked to heart Dr. [address] Pt cant get in with Dr.Klein until the 22nd. Concerned about the pvcs he is having, and his pulse rate is real low (30's and 40's)

## 2013-09-14 NOTE — Telephone Encounter (Signed)
Spoke to patient regarding dizziness and SOB. Patient states that he is aware of his fluid retention and believes that he needs to see someone sooner that his appt on 6-22 with SK. Phone note was sent to Dr.Hochrein/nurse for futher review. Patient aware that his PVCs are the cause of his abn HR readings.

## 2013-09-14 NOTE — Telephone Encounter (Signed)
New message     Has Dr Graciela HusbandsKlein decided anything on his pacemaker situation.  Dr Cleta Albertsaub and Dr Graciela HusbandsKlein have been talking about patient.  Patient said Leonette MostCharles may know something also.

## 2013-09-14 NOTE — Telephone Encounter (Signed)
I called and spoke with Andrew [name]. He will continue his increased dose of Lasix and keep us updated on his progress. Appreciate all the help from Dr. Odessa FlemingKlein's office.

## 2013-09-16 ENCOUNTER — Other Ambulatory Visit: Payer: Self-pay | Admitting: Cardiology

## 2013-09-17 ENCOUNTER — Telehealth: Payer: Self-pay | Admitting: *Deleted

## 2013-09-17 NOTE — Telephone Encounter (Signed)
Called pt to follow up on his s/s he was having last week.  He states he is feeling better but still feels like his heart is out of rhythm.  He edema, weight and breathing have improved per his report.  He will call back is s/s change.

## 2013-09-21 ENCOUNTER — Ambulatory Visit (INDEPENDENT_AMBULATORY_CARE_PROVIDER_SITE_OTHER): Payer: Medicare Other | Admitting: Emergency Medicine

## 2013-09-21 ENCOUNTER — Ambulatory Visit (INDEPENDENT_AMBULATORY_CARE_PROVIDER_SITE_OTHER): Payer: Medicare Other

## 2013-09-21 VITALS — BP 132/70 | HR 96 | Temp 97.7°F | Resp 16 | Ht 69.5 in | Wt 192.0 lb

## 2013-09-21 DIAGNOSIS — R0602 Shortness of breath: Secondary | ICD-10-CM

## 2013-09-21 DIAGNOSIS — R0989 Other specified symptoms and signs involving the circulatory and respiratory systems: Secondary | ICD-10-CM

## 2013-09-21 LAB — POCT CBC
GRANULOCYTE PERCENT: 80.1 % — AB (ref 37–80)
HEMATOCRIT: 38.4 % — AB (ref 43.5–53.7)
Hemoglobin: 12 g/dL — AB (ref 14.1–18.1)
LYMPH, POC: 0.9 (ref 0.6–3.4)
MCH, POC: 30.7 pg (ref 27–31.2)
MCHC: 31.3 g/dL — AB (ref 31.8–35.4)
MCV: 98.2 fL — AB (ref 80–97)
MID (CBC): 0.5 (ref 0–0.9)
MPV: 9.1 fL (ref 0–99.8)
POC GRANULOCYTE: 5.5 (ref 2–6.9)
POC LYMPH PERCENT: 13.2 %L (ref 10–50)
POC MID %: 6.7 % (ref 0–12)
Platelet Count, POC: 274 10*3/uL (ref 142–424)
RBC: 3.91 M/uL — AB (ref 4.69–6.13)
RDW, POC: 14.9 %
WBC: 6.9 10*3/uL (ref 4.6–10.2)

## 2013-09-21 NOTE — Progress Notes (Signed)
   Subjective:    Patient ID: Andrew [name], male    DOB: 08/07/1930, 80 y.o.   MRN: [id number]  HPI 80 year old male patient presents with SOB and cough. His oxygen was 85 on room air while resting when he came in. Patient has a long history of COPD congestive heart failure hypertension. He is followed by Dr. Graciela HusbandsKlein. He has a dual-chamber pacemaker. He recently was seen and in bigeminy. We checked a potassium magnesium and these were normal. He becomes short of breath with minimal exertion. He feels like he has phlegm lodged in his throat the   Review of Systems     Objective:   Physical Exam patient was initially tachypneic. His pulse ox was 85. He was placed on 2 L and had fairly rapid return of his oxygen level to 97-98% his neck is supple. Chest revealed decreased breath sounds at the bases poor air exchange but no areas of dullness. Cardiac exam reveals a pacemaker in the left anterior chest. There is a trigeminal type rhythm EKG shows normal pacemaker capture there is a trigeminal rhythm. Results for orders placed in visit on 09/21/13  POCT CBC      Result Value Ref Range   WBC 6.9  4.6 - 10.2 K/uL   Lymph, poc 0.9  0.6 - 3.4   POC LYMPH PERCENT 13.2  10 - 50 %L   MID (cbc) 0.5  0 - 0.9   POC MID % 6.7  0 - 12 %M   POC Granulocyte 5.5  2 - 6.9   Granulocyte percent 80.1 (*) 37 - 80 %G   RBC 3.91 (*) 4.69 - 6.13 M/uL   Hemoglobin 12.0 (*) 14.1 - 18.1 g/dL   HCT, POC 40.938.4 (*) 81.143.5 - 53.7 %   MCV 98.2 (*) 80 - 97 fL   MCH, POC 30.7  27 - 31.2 pg   MCHC 31.3 (*) 31.8 - 35.4 g/dL   RDW, POC 91.414.9     Platelet Count, POC 274  142 - 424 K/uL   MPV 9.1  0 - 99.8 fL   [facility] reading (PRIMARY) by  Dr.Indy Kuck does appear to be some worsening of failure. On the lateral film there does appear to be some fluid in the fissure compared to a film done in March. Please comment      Assessment & Plan:  Check a BNP. He is going to increase his Lasix to 60  in the morning and 40 in the afternoon.  He is due to see Dr. Graciela HusbandsKlein on Monday. His chest x-ray does look slightly worse today.

## 2013-09-22 LAB — BRAIN NATRIURETIC PEPTIDE: Brain Natriuretic Peptide: 1187.8 pg/mL — ABNORMAL HIGH (ref 0.0–100.0)

## 2013-09-24 ENCOUNTER — Ambulatory Visit (INDEPENDENT_AMBULATORY_CARE_PROVIDER_SITE_OTHER): Payer: Medicare Other | Admitting: Internal Medicine

## 2013-09-24 ENCOUNTER — Encounter: Payer: Self-pay | Admitting: Internal Medicine

## 2013-09-24 VITALS — BP 180/70 | HR 76 | Ht 69.5 in | Wt 191.0 lb

## 2013-09-24 DIAGNOSIS — I4949 Other premature depolarization: Secondary | ICD-10-CM

## 2013-09-24 DIAGNOSIS — I4729 Other ventricular tachycardia: Secondary | ICD-10-CM

## 2013-09-24 DIAGNOSIS — R0602 Shortness of breath: Secondary | ICD-10-CM

## 2013-09-24 DIAGNOSIS — I493 Ventricular premature depolarization: Secondary | ICD-10-CM

## 2013-09-24 DIAGNOSIS — Z950 Presence of cardiac pacemaker: Secondary | ICD-10-CM

## 2013-09-24 DIAGNOSIS — I255 Ischemic cardiomyopathy: Secondary | ICD-10-CM

## 2013-09-24 DIAGNOSIS — I472 Ventricular tachycardia: Secondary | ICD-10-CM

## 2013-09-24 DIAGNOSIS — I480 Paroxysmal atrial fibrillation: Secondary | ICD-10-CM

## 2013-09-24 DIAGNOSIS — I2589 Other forms of chronic ischemic heart disease: Secondary | ICD-10-CM

## 2013-09-24 DIAGNOSIS — I4891 Unspecified atrial fibrillation: Secondary | ICD-10-CM

## 2013-09-24 DIAGNOSIS — I5022 Chronic systolic (congestive) heart failure: Secondary | ICD-10-CM

## 2013-09-24 LAB — MDC_IDC_ENUM_SESS_TYPE_INCLINIC
Battery Voltage: 3.01 V
Brady Statistic AP VP Percent: 5.01 %
Brady Statistic AS VP Percent: 93.7 %
Brady Statistic RA Percent Paced: 5.01 %
Date Time Interrogation Session: 20150622203727
Lead Channel Impedance Value: 1140 Ohm
Lead Channel Impedance Value: 342 Ohm
Lead Channel Impedance Value: 399 Ohm
Lead Channel Impedance Value: 494 Ohm
Lead Channel Impedance Value: 779 Ohm
Lead Channel Impedance Value: 893 Ohm
Lead Channel Pacing Threshold Amplitude: 0.5 V
Lead Channel Pacing Threshold Amplitude: 0.75 V
Lead Channel Pacing Threshold Pulse Width: 0.4 ms
Lead Channel Sensing Intrinsic Amplitude: 2.125 mV
Lead Channel Sensing Intrinsic Amplitude: 5.125 mV
Lead Channel Setting Pacing Amplitude: 2 V
Lead Channel Setting Pacing Amplitude: 2 V
Lead Channel Setting Pacing Pulse Width: 0.4 ms
MDC IDC MSMT BATTERY REMAINING LONGEVITY: 67 mo
MDC IDC MSMT LEADCHNL LV IMPEDANCE VALUE: 513 Ohm
MDC IDC MSMT LEADCHNL LV IMPEDANCE VALUE: 627 Ohm
MDC IDC MSMT LEADCHNL RA PACING THRESHOLD PULSEWIDTH: 0.4 ms
MDC IDC MSMT LEADCHNL RA SENSING INTR AMPL: 2.75 mV
MDC IDC MSMT LEADCHNL RV IMPEDANCE VALUE: 456 Ohm
MDC IDC MSMT LEADCHNL RV SENSING INTR AMPL: 4.625 mV
MDC IDC SET LEADCHNL RV PACING AMPLITUDE: 2.5 V
MDC IDC SET LEADCHNL RV PACING PULSEWIDTH: 0.4 ms
MDC IDC SET LEADCHNL RV SENSING SENSITIVITY: 0.9 mV
MDC IDC SET ZONE DETECTION INTERVAL: 350 ms
MDC IDC SET ZONE DETECTION INTERVAL: 400 ms
MDC IDC STAT BRADY AP VS PERCENT: 0 %
MDC IDC STAT BRADY AS VS PERCENT: 1.29 %
MDC IDC STAT BRADY RV PERCENT PACED: 98.71 %

## 2013-09-24 NOTE — Patient Instructions (Addendum)
Your physician has recommended you make the following change in your medication:  1) INCREASE Furosemide to 80 mg twice daily for 3 days, then return to 60 mg daily 2) CHANGE Hydralazine to 25 mg morning, 25 mg afternoon, 12.5 mg at bedtime  Your physician recommends that you return for lab work on Thursday 6/25 for BMET  Your physician has requested that you have an echocardiogram. Echocardiography is a painless test that uses sound waves to create images of your heart. It provides your doctor with information about the size and shape of your heart and how well your heart's chambers and valves are working. This procedure takes approximately one hour. There are no restrictions for this procedure.  Your physician recommends that you schedule a follow-up appointment in: 1-2 weeks with Andrew FredricksonLori Gerhardt, Andrew [name] or Dr. Antoine [name].  Your physician wants you to follow-up in: 6 months with Dr. Graciela [name]. You will receive a reminder letter in the mail two months in advance. If you don't receive a letter, please call our office to schedule the follow-up appointment.

## 2013-09-24 NOTE — Progress Notes (Signed)
Patient Care Team: Collene GobbleSteven A Daub, MD as PCP - General (Family Medicine)   HPI  Andrew [name] is a 80 y.o. male Is referred by Dr. Cleta Albertsaub because of increasing shortness of breath.    He has a history of ischemic heart disease with ejection fractions ranging in a 15-30% range most recently assessed 2014. Over recent weeks he noted that his blood pressure he was not given an accurate measurements; he went to go see his PCP who noted that his weight was up and increase his diuretics. He also gave him 24 hour day oxygen in the context of his chronic obstructive lung disease.  he has urinated a little bit more briskly over the last couple of days Is still short of breath and notes peripheral edema.  He denies chest discomfort.  There've been no changes in his medications apart from his diuretics.   Past Medical History  Diagnosis Date  . Chronic systolic heart failure     a. Chronic LV systolic failure. b. Echo in Dec 2011 showed EF of 25% - f/u echo 02/2011: Mild LVH, EF 15%, posterior lateral akinesis, inferior akinesis, grade 1 diastolic dysfunction, mild LAE. c. s/p BiV pacemaker 06/2011.  [name]. Atrial flutter     a. Off amiodarone because of increased LFTs  . COPD (chronic obstructive pulmonary disease)     Chronic SOB, O2 dependent  . Myocardial infarction 1994  . Gastro - esophageal reflux   . Claudication   . BPH (benign prostatic hyperplasia)   . Coronary artery disease     a. Myocardial infarction in 1984,  99 and 07. b. H/o multiple RCA stents. c.  LHC 04/2011: without obstructive disease. oLM 25%, mLAD 30%, pCFX 25%, prox to mid RCA stent ok, dRCA 30% and 70%, pPDA 25%, mPL 25-30%, EF 25%.  [name]. Hypertension   . Atrial fibrillation     a. On Xarelto.  . Hemorrhoids   . Hyperlipidemia     statin intolerant 2/2 myalgias  . Hx of adenomatous colonic polyps   . Hx of colonoscopy   . Chronic renal insufficiency   . Biventricular cardiac pacemaker in situ 3.2013  . NSVT  (nonsustained ventricular tachycardia)     a. 04/2012- 6 beats on tele.  [name]. Shortness of breath   . Arthritis     hips ,back ,hands  . Iron deficiency anemia, unspecified 03/20/2013    Past Surgical History  Procedure Laterality Date  . Cardiac catheterization  2008  . Cataract extraction    . Salivary gland surgery    . Coronary angioplasty with stent placement    . Insert / replace / remove pacemaker  06/16/2011  . Tee without cardioversion  05/08/2012    Procedure: TRANSESOPHAGEAL ECHOCARDIOGRAM (TEE);  Surgeon: Vesta MixerPhilip J Nahser, MD;  [facility];  Service: Cardiovascular;  Laterality: N/A;    Current Outpatient Prescriptions  Medication Sig Dispense Refill  . albuterol (PROVENTIL HFA;VENTOLIN HFA) 108 (90 BASE) MCG/ACT inhaler Inhale 1 puff into the lungs every 6 (six) hours as needed for wheezing or shortness of breath.      . ALPRAZolam (XANAX) 0.25 MG tablet Take 0.25 mg by mouth daily as needed. For anxiety.      . carvedilol (COREG) 25 MG tablet Take 25 mg by mouth 2 (two) times daily with a meal.      . esomeprazole (NEXIUM) 40 MG capsule Take twice a day  180 capsule  3  . fluticasone (FLONASE)  50 MCG/ACT nasal spray Place 2 sprays into both nostrils as needed for allergies.  16 g  11  . Fluticasone-Salmeterol (ADVAIR DISKUS) 250-50 MCG/DOSE AEPB Inhale 1 puff into the lungs 2 (two) times daily.  3 each  3  . furosemide (LASIX) 40 MG tablet TAKE AS DIRECTED  135 tablet  3  . hydrALAZINE (APRESOLINE) 25 MG tablet Take 12.5 mg by mouth 3 (three) times daily. Prn      . HYDROcodone-acetaminophen (NORCO/VICODIN) 5-325 MG per tablet Take 1 tablet by mouth every 6 (six) hours. Arthritis pain      . ipratropium (ATROVENT) 0.02 % nebulizer solution Take 2.5 mLs (500 mcg total) by nebulization 4 (four) times daily.  75 mL  12  . losartan (COZAAR) 50 MG tablet TAKE 1 TABLET TWICE A DAY  180 tablet  0  . meclizine (ANTIVERT) 25 MG tablet Take a 1/2 or 1 tablet up to 3x a day as  needed  30 tablet  1  . nitroGLYCERIN (NITROSTAT) 0.4 MG SL tablet Place 0.4 mg under the tongue every 5 (five) minutes as needed for chest pain.      . potassium chloride (KLOR-CON M10) 10 MEQ tablet Take 1 tablet (10 mEq total) by mouth daily.  90 tablet  3  . PROAIR HFA 108 (90 BASE) MCG/ACT inhaler INHALE 1 TO 2 PUFFS EVERY 6 HOURS AS NEEDED  3 each  1  . rivaroxaban (XARELTO) 20 MG TABS tablet Take 1 tablet (20 mg total) by mouth daily with supper.  90 tablet  3  . Tamsulosin HCl (FLOMAX) 0.4 MG CAPS Take 0.4 mg by mouth 2 (two) times daily.       . [DISCONTINUED] metoprolol tartrate (LOPRESSOR) 25 MG tablet Take 1/2 tablet every other day   45 tablet  3   No current facility-administered medications for this visit.    Allergies  Allergen Reactions  . Codeine Nausea And Vomiting  . Inspra [Eplerenone] Other (See Comments)    Stomach problems  . Spironolactone Other (See Comments)    Gynecomastia  . Doxycycline Itching and Rash  . Sulfonamide Derivatives Itching and Rash    Review of Systems negative except from HPI and PMH  Physical Exam BP 180/70  Pulse 76  Ht 5' 9.5" (1.765 m)  Wt 191 lb (86.637 kg)  BMI 27.81 kg/m2 Well developed and well nourished in no acute distress HENT normal E scleral and icterus clear Neck Supple JVP 7-8  +HJR carotids brisk and full Clear to ausculation  *Regular rate and rhythm, 2/6 systolic  Soft with active bowel sounds No clubbing cyanosis  2+ Edema Alert and oriented, grossly normal motor and sensory function Skin Warm and Dry  ECG demonstrates sinus rhythm P. synchronous pacing and frequent (50%) PVCs. An ECG from last week demonstrated sinus rhythm also with PVCs and a pattern of bigeminy PVCs for left bundle intermediate axis.    Assessment and  Plan Congestive heart failure-acute on chronic-systolic  Ischemic cardiomyopathy  Hypertension-crisis-urgent  PVCs-frequent-new onset  Implantable defibrillator-[employer] The  patient's device was interrogated and the information was fully reviewed.  The device was not reprogrammed. Notably however, optivol was increased coincidental with loss of ventricular pacing which I interpret as the onset of ventricular ectopy  The cause of his acute decompensation is not clear but it is temporally related to the onset of ventricular ectopy. The  hope would be that the heart failure cause of the ectopy the electrical mechanical interactions; it will  be considerably harder to treat if  the PVCs are the culprit.  Given that I would like to 1) increase his diuretics from 60/40-80 twice a day for 3 days 2) increase his antihypertensives by having and change his hydralazine from 12-1/2 3 times a day to 25/25/12.5 3) Will obtain a 2-D echo to look at left ventricular function 4) reassess metabolic profile later this week 5) follow up with JH/LG/SK  in the next one to 2 weeks for reassessment of weight in heart failure status.

## 2013-09-27 ENCOUNTER — Other Ambulatory Visit (INDEPENDENT_AMBULATORY_CARE_PROVIDER_SITE_OTHER): Payer: Medicare Other

## 2013-09-27 DIAGNOSIS — I4891 Unspecified atrial fibrillation: Secondary | ICD-10-CM

## 2013-09-27 DIAGNOSIS — I480 Paroxysmal atrial fibrillation: Secondary | ICD-10-CM

## 2013-09-27 DIAGNOSIS — I493 Ventricular premature depolarization: Secondary | ICD-10-CM

## 2013-09-27 DIAGNOSIS — R0602 Shortness of breath: Secondary | ICD-10-CM

## 2013-09-27 DIAGNOSIS — I4949 Other premature depolarization: Secondary | ICD-10-CM

## 2013-09-28 LAB — BASIC METABOLIC PANEL
BUN: 22 mg/dL (ref 6–23)
CHLORIDE: 94 meq/L — AB (ref 96–112)
CO2: 26 mEq/L (ref 19–32)
Calcium: 8.5 mg/dL (ref 8.4–10.5)
Creatinine, Ser: 1.6 mg/dL — ABNORMAL HIGH (ref 0.4–1.5)
GFR: 45.26 mL/min — AB (ref >60.00)
Glucose, Bld: 103 mg/dL — ABNORMAL HIGH (ref 70–99)
POTASSIUM: 4.2 meq/L (ref 3.5–5.1)
SODIUM: 131 meq/L — AB (ref 135–145)

## 2013-09-29 ENCOUNTER — Other Ambulatory Visit: Payer: Self-pay | Admitting: Cardiology

## 2013-10-01 ENCOUNTER — Ambulatory Visit (HOSPITAL_COMMUNITY): Payer: Medicare Other

## 2013-10-01 ENCOUNTER — Telehealth: Payer: Self-pay | Admitting: *Deleted

## 2013-10-01 NOTE — Telephone Encounter (Signed)
lmtcb   (want to confirm pt understanding of Hydralazine change from last office visit: 25 mg morning, 25 mg afternoon, 12.5 mg bedtime)

## 2013-10-01 NOTE — Telephone Encounter (Signed)
Pt confirmed taking correct advised amount/regimen of Hydralazine.

## 2013-10-02 ENCOUNTER — Encounter: Payer: Self-pay | Admitting: Cardiology

## 2013-10-04 ENCOUNTER — Other Ambulatory Visit: Payer: Self-pay | Admitting: Physician Assistant

## 2013-10-08 ENCOUNTER — Ambulatory Visit (HOSPITAL_COMMUNITY)
Admission: RE | Admit: 2013-10-08 | Discharge: 2013-10-08 | Disposition: A | Discharge status: Home / Self Care | Payer: Medicare Other | Source: Ambulatory Visit | Attending: Cardiology | Admitting: Cardiology

## 2013-10-08 DIAGNOSIS — I4949 Other premature depolarization: Secondary | ICD-10-CM | POA: Insufficient documentation

## 2013-10-08 DIAGNOSIS — I5022 Chronic systolic (congestive) heart failure: Secondary | ICD-10-CM

## 2013-10-08 DIAGNOSIS — I2589 Other forms of chronic ischemic heart disease: Secondary | ICD-10-CM | POA: Insufficient documentation

## 2013-10-08 DIAGNOSIS — I517 Cardiomegaly: Secondary | ICD-10-CM

## 2013-10-08 DIAGNOSIS — I480 Paroxysmal atrial fibrillation: Secondary | ICD-10-CM

## 2013-10-08 DIAGNOSIS — I493 Ventricular premature depolarization: Secondary | ICD-10-CM

## 2013-10-08 DIAGNOSIS — I255 Ischemic cardiomyopathy: Secondary | ICD-10-CM

## 2013-10-08 DIAGNOSIS — I4891 Unspecified atrial fibrillation: Secondary | ICD-10-CM | POA: Insufficient documentation

## 2013-10-08 NOTE — Progress Notes (Signed)
2D Echocardiogram Complete.  10/08/2013   Nealie Mchatton, RDCS  

## 2013-10-10 ENCOUNTER — Encounter: Payer: Self-pay | Admitting: Nurse Practitioner

## 2013-10-10 ENCOUNTER — Other Ambulatory Visit: Payer: Self-pay | Admitting: *Deleted

## 2013-10-10 ENCOUNTER — Ambulatory Visit (INDEPENDENT_AMBULATORY_CARE_PROVIDER_SITE_OTHER): Payer: Medicare Other | Admitting: Nurse Practitioner

## 2013-10-10 VITALS — BP 120/80 | HR 77 | Ht 70.0 in | Wt 188.0 lb

## 2013-10-10 DIAGNOSIS — I2589 Other forms of chronic ischemic heart disease: Secondary | ICD-10-CM

## 2013-10-10 DIAGNOSIS — I5022 Chronic systolic (congestive) heart failure: Secondary | ICD-10-CM

## 2013-10-10 DIAGNOSIS — I4949 Other premature depolarization: Secondary | ICD-10-CM

## 2013-10-10 DIAGNOSIS — I255 Ischemic cardiomyopathy: Secondary | ICD-10-CM

## 2013-10-10 DIAGNOSIS — I493 Ventricular premature depolarization: Secondary | ICD-10-CM

## 2013-10-10 LAB — BRAIN NATRIURETIC PEPTIDE: Pro B Natriuretic peptide (BNP): 1021 pg/mL — ABNORMAL HIGH (ref 0.0–100.0)

## 2013-10-10 NOTE — Patient Instructions (Addendum)
We will check lab today  Stay on your current medicines  Monitor your weights daily - adjust your lasix as we have talked about today  See me in 6 weeks  Call the [facility] office at [phone] if you have any questions, problems or concerns.

## 2013-10-10 NOTE — Addendum Note (Signed)
Addended by: Brien MatesGONZALEZ, DANIELLE R on: 10/10/2013 03:03 PM   Modules accepted: Orders

## 2013-10-10 NOTE — Progress Notes (Signed)
Andrew GallusEdward L [name] Date of Birth: 11/10/1930 Medical Record #[id number]  History of Present Illness: Andrew [name] is seen back today for a follow up visit. Seen for Dr. Zannie KehrHochrein/Klein. He has a very complex medical history which includes chronic systolic heart failure. Now with BiV device in place. On Xarelto for his atrial flutter and off of amiodarone due to increased LFT's in the remote past. Has COPD. On oxygen only at night. Has known CAD with remote MIs and multiple stents. EF is 15% per echo back in 2012.   Other issues include HTN, HLD, CRI, iron deficiency anemia - receiving IV iron. Was admitted back earlier in 2014 with sepsis - had ampicillin sensitive enterococcal bacteremia. His TEE was negative for a vegetation on his device.   Last seen by me in January of 2015. Seemed to be holding his own.   Was admitted at the end of March of 2015 with COPD exacerbation and urinary retention. Xarelto dose was cut back due to his kidney function - has now normalized and he was put back on regular dose.   I saw him back in May. Seemed to be holding his own. Saw EP in May - more volume overload noted. BP high. Noted to have increased Optivol that was coincidental with loss of V pacing which Dr. Graciela HusbandsKlein interpreted as an onset of ventricular ectopy. The cause of his acute decompensation was not clear but it was related to onset of ventricular ectopy. It was Dr. Odessa FlemingKlein's hope that the heart failure caused the ectopy and that it would be considerably harder to treat if the PVCs are the culprit.  Echo updated -  This is unchanged -  EF 30 to 35%. Got extra lasix for 3 days and dose Hydralazine dose changed to 25 in am, 25 in afternoon, 12.5 at bedtime. Has been placed on oxygen now in the day as well as the night.   Comes back today. Here with his wife. Says he is feeling better. For hematology visit later this week - reports iron levels beginning to drop since last visit with Dr. Myna HidalgoEnnever. Really can't identify a  trigger as to what happened over the past several weeks. His scales at home had stopped working. Weight was up when he got a new set of scales. Now feels better. No palpitations. No ICD discharges. Using his oxygen all the time now but hoping that this is "temporary". Some cough. Notes that he does not get his full dose of Advair. Not dizzy. No chest pain. Typically getting both 25 mg doses of Hydralazine but has held the 12.5 dose several times due to low readings. Weight back down.   Current Outpatient Prescriptions  Medication Sig Dispense Refill  . ADVAIR DISKUS 250-50 MCG/DOSE AEPB USE ONE INHALATION INTO THE LUNGS TWICE A DAY  180 each  1  . albuterol (PROVENTIL HFA;VENTOLIN HFA) 108 (90 BASE) MCG/ACT inhaler Inhale 1 puff into the lungs every 6 (six) hours as needed for wheezing or shortness of breath.      [name]. albuterol (PROVENTIL) (5 MG/ML) 0.5% nebulizer solution Take 2.5 mg by nebulization every 6 (six) hours as needed for wheezing or shortness of breath.      . ALPRAZolam (XANAX) 0.25 MG tablet Take 0.25 mg by mouth daily as needed. For anxiety.      . carvedilol (COREG) 25 MG tablet Take 25 mg by mouth 2 (two) times daily with a meal.      . esomeprazole (NEXIUM) 40 MG capsule  Take twice a day  180 capsule  3  . fluticasone (FLONASE) 50 MCG/ACT nasal spray Place 2 sprays into both nostrils as needed for allergies.  16 g  11  . furosemide (LASIX) 40 MG tablet TAKE AS DIRECTED  135 tablet  3  . hydrALAZINE (APRESOLINE) 25 MG tablet Take 25 mg in am, Take 25 in the afternoon, and 12.5mg  at bedtime  225 tablet  1  . HYDROcodone-acetaminophen (NORCO/VICODIN) 5-325 MG per tablet Take 1 tablet by mouth every 6 (six) hours. Arthritis pain      . ipratropium (ATROVENT) 0.02 % nebulizer solution Take 2.5 mLs (500 mcg total) by nebulization 4 (four) times daily.  75 mL  12  . losartan (COZAAR) 50 MG tablet TAKE 1 TABLET TWICE A DAY  180 tablet  0  . meclizine (ANTIVERT) 25 MG tablet Take a 1/2 or 1  tablet up to 3x a day as needed  30 tablet  1  . nitroGLYCERIN (NITROSTAT) 0.4 MG SL tablet Place 0.4 mg under the tongue every 5 (five) minutes as needed for chest pain.      . NON FORMULARY Place 2 L into the nose daily.      . potassium chloride (KLOR-CON M10) 10 MEQ tablet Take 1 tablet (10 mEq total) by mouth daily.  90 tablet  3  . rivaroxaban (XARELTO) 20 MG TABS tablet Take 1 tablet (20 mg total) by mouth daily with supper.  90 tablet  3  . Tamsulosin HCl (FLOMAX) 0.4 MG CAPS Take 0.4 mg by mouth 2 (two) times daily.       . [DISCONTINUED] metoprolol tartrate (LOPRESSOR) 25 MG tablet Take 1/2 tablet every other day   45 tablet  3   No current facility-administered medications for this visit.    Allergies  Allergen Reactions  . Codeine Nausea And Vomiting  . Inspra [Eplerenone] Other (See Comments)    Stomach problems  . Spironolactone Other (See Comments)    Gynecomastia  . Doxycycline Itching and Rash  . Sulfonamide Derivatives Itching and Rash    Past Medical History  Diagnosis Date  . Chronic systolic heart failure     a. Chronic LV systolic failure. b. Echo in Dec 2011 showed EF of 25% - f/u echo 02/2011: Mild LVH, EF 15%, posterior lateral akinesis, inferior akinesis, grade 1 diastolic dysfunction, mild LAE. c. s/p BiV pacemaker 06/2011.  [name] Atrial flutter     a. Off amiodarone because of increased LFTs  . COPD (chronic obstructive pulmonary disease)     Chronic SOB, O2 dependent  . Myocardial infarction 1994  . Gastro - esophageal reflux   . Claudication   . BPH (benign prostatic hyperplasia)   . Coronary artery disease     a. Myocardial infarction in 1984,  99 and 07. b. H/o multiple RCA stents. c.  LHC 04/2011: without obstructive disease. oLM 25%, mLAD 30%, pCFX 25%, prox to mid RCA stent ok, dRCA 30% and 70%, pPDA 25%, mPL 25-30%, EF 25%.  [name] Hypertension   . Atrial fibrillation     a. On Xarelto.  . Hemorrhoids   . Hyperlipidemia     statin intolerant 2/2  myalgias  . Hx of adenomatous colonic polyps   . Hx of colonoscopy   . Chronic renal insufficiency   . Biventricular cardiac pacemaker in situ 3.2013  . NSVT (nonsustained ventricular tachycardia)     a. 04/2012- 6 beats on tele.  [name] Shortness of breath   . Arthritis  hips ,back ,hands  . Iron deficiency anemia, unspecified 03/20/2013    Past Surgical History  Procedure Laterality Date  . Cardiac catheterization  2008  . Cataract extraction    . Salivary gland surgery    . Coronary angioplasty with stent placement    . Insert / replace / remove pacemaker  06/16/2011  . Tee without cardioversion  05/08/2012    Procedure: TRANSESOPHAGEAL ECHOCARDIOGRAM (TEE);  Surgeon: Vesta MixerPhilip J Nahser, MD;  [facility];  Service: Cardiovascular;  Laterality: N/A;    History  Smoking status  . Former Smoker -- 1.50 packs/day for 55 years  . Types: Cigarettes  . Start date: 05/21/1954  . Quit date: 01/03/2009  Smokeless tobacco  . Never Used    Comment: quit 4 years ago    History  Alcohol Use No    Comment: occasional    Family History  Problem Relation Age of Onset  . Coronary artery disease Father   . Heart disease Mother   . Colon cancer Neg Hx   . Heart disease Brother     Review of Systems: The review of systems is per the HPI.  All other systems were reviewed and are negative.  Physical Exam: BP 120/80  Pulse 77  Ht 5\' 10"  (1.778 m)  Wt 188 lb (85.276 kg)  BMI 26.98 kg/m2  SpO2 96% His oxygen level with walking into the exam room - down to 80%.  Patient is very pleasant and in no acute distress. Chronically ill. Skin is warm and dry. Color is normal.  HEENT is unremarkable. Normocephalic/atraumatic. PERRL. Sclera are nonicteric. Neck is supple. No masses. No JVD. Lungs are fairly clear. Cardiac exam shows a regular rate and rhythm - presumed paced. Abdomen is soft. Extremities are without edema. Gait and ROM are intact. No gross neurologic deficits noted.  Wt  Readings from Last 3 Encounters:  10/10/13 188 lb (85.276 kg)  09/24/13 191 lb (86.637 kg)  09/21/13 192 lb (87.091 kg)    LABORATORY DATA/PROCEDURES: PENDING  Lab Results  Component Value Date   WBC 6.9 09/21/2013   HGB 12.0* 09/21/2013   HCT 38.4* 09/21/2013   PLT 223 08/13/2013   GLUCOSE 103* 09/27/2013   CHOL 184 05/02/2013   TRIG 243.0* 05/02/2013   HDL 29.50* 05/02/2013   LDLDIRECT 119.4 05/02/2013   LDLCALC 83 03/17/2012   ALT <8 08/13/2013   AST 14 08/13/2013   NA 131* 09/27/2013   K 4.2 09/27/2013   CL 94* 09/27/2013   CREATININE 1.6* 09/27/2013   BUN 22 09/27/2013   CO2 26 09/27/2013   TSH 4.286 08/09/2011   INR 1.77* 05/03/2012   HGBA1C 6.0* 03/10/2011    BNP (last 3 results)  Recent Labs  11/24/12 1243 06/25/13 1714  PROBNP 433.0* 6196.0*   Echo Study Conclusions from July 2015  - Left ventricle: The cavity size was mildly dilated. Systolic function was moderately to severely reduced. The estimated ejection fraction was in the range of 30% to 35%. There is akinesis of the inferior myocardium. - Mitral valve: Calcified annulus.  Impressions:  - Compared to the prior study, there has been no significant interval change.    Assessment / Plan:  1. Systolic HF - seems better compensated. Recheck BMET today. He is back on 60-20 of Lasix. Understands the need to increase back up if weight goes up. Will see back in 6 weeks - probably needs to be seen more regularly.   2. COPD - with past use  of amiodarone - managed medically. Will talk with Dr. Cleta Alberts about his Advair.  3. CAD - managed medically - no symptoms reported.   4. Iron deficiency anemia - continues with IV iron infusions. Sees Dr. Myna Hidalgo later this week  5. BiV PPM - followed by Dr. Graciela Husbands   6. History of atrial flutter/fib/VT/PVCs - no longer on amiodarone  7. Chronic anticoagulation   8. Back pain - chronic issue.   Hopefully this improvement in his heart failure has resulted in his improvement  clinically. See back in 6 weeks.   Patient is agreeable to this plan and will call if any problems develop in the interim.   Rosalio Macadamia, RN, ANP-C [facility] [address] [place], [place]  [address] [phone]

## 2013-10-11 ENCOUNTER — Other Ambulatory Visit: Payer: Self-pay | Admitting: *Deleted

## 2013-10-11 DIAGNOSIS — I5022 Chronic systolic (congestive) heart failure: Secondary | ICD-10-CM

## 2013-10-11 MED ORDER — FUROSEMIDE 40 MG PO TABS: 60.0000 mg | ORAL_TABLET | Freq: Two times a day (BID) | ORAL | Status: DC

## 2013-10-12 ENCOUNTER — Ambulatory Visit (HOSPITAL_BASED_OUTPATIENT_CLINIC_OR_DEPARTMENT_OTHER): Payer: Medicare Other | Admitting: Family

## 2013-10-12 ENCOUNTER — Other Ambulatory Visit (HOSPITAL_BASED_OUTPATIENT_CLINIC_OR_DEPARTMENT_OTHER): Payer: Medicare Other | Admitting: Lab

## 2013-10-12 ENCOUNTER — Encounter: Payer: Self-pay | Admitting: Family

## 2013-10-12 VITALS — BP 127/41 | HR 64 | Temp 97.5°F | Resp 20 | Ht 70.0 in | Wt 187.0 lb

## 2013-10-12 DIAGNOSIS — D509 Iron deficiency anemia, unspecified: Secondary | ICD-10-CM

## 2013-10-12 LAB — CBC WITH DIFFERENTIAL (CANCER CENTER ONLY)
BASO#: 0 10*3/uL (ref 0.0–0.2)
BASO%: 0.2 % (ref 0.0–2.0)
EOS ABS: 0.1 10*3/uL (ref 0.0–0.5)
EOS%: 1.7 % (ref 0.0–7.0)
HCT: 32.7 % — ABNORMAL LOW (ref 38.7–49.9)
HEMOGLOBIN: 10.8 g/dL — AB (ref 13.0–17.1)
LYMPH#: 0.6 10*3/uL — ABNORMAL LOW (ref 0.9–3.3)
LYMPH%: 9.5 % — AB (ref 14.0–48.0)
MCH: 32.2 pg (ref 28.0–33.4)
MCHC: 33 g/dL (ref 32.0–35.9)
MCV: 98 fL (ref 82–98)
MONO#: 0.7 10*3/uL (ref 0.1–0.9)
MONO%: 10.7 % (ref 0.0–13.0)
NEUT#: 5.2 10*3/uL (ref 1.5–6.5)
NEUT%: 77.9 % (ref 40.0–80.0)
Platelets: 242 10*3/uL (ref 145–400)
RBC: 3.35 10*6/uL — AB (ref 4.20–5.70)
RDW: 13 % (ref 11.1–15.7)
WBC: 6.6 10*3/uL (ref 4.0–10.0)

## 2013-10-12 LAB — RETICULOCYTES (CHCC)
ABS Retic: 33.9 10*3/uL (ref 19.0–186.0)
RBC.: 3.39 MIL/uL — ABNORMAL LOW (ref 4.22–5.81)
RETIC CT PCT: 1 % (ref 0.4–2.3)

## 2013-10-12 LAB — IRON AND TIBC CHCC
%SAT: 18 % — ABNORMAL LOW (ref 20–55)
IRON: 27 ug/dL — AB (ref 42–163)
TIBC: 156 ug/dL — AB (ref 202–409)
UIBC: 129 ug/dL (ref 117–376)

## 2013-10-12 LAB — CHCC SATELLITE - SMEAR

## 2013-10-12 LAB — FERRITIN CHCC: Ferritin: 350 ng/ml — ABNORMAL HIGH (ref 22–316)

## 2013-10-12 NOTE — Progress Notes (Signed)
[facility]  [phone] [phone]  ID: Andrew [name] OB: 03/12/1931 MR#: [id number] QMV#:[id number] Patient Care Team: [name], MD as PCP - General (Family Medicine)  DIAGNOSIS: Iron deficiency anemia  Erythropoitin deficiency  INTERVAL HISTORY: Andrew [name] is a very pleasant 80 yo male here today with his wife for followup. He was admitted to the hospital in March for exacerbation of CHF and COPD. He last received iron back then. He is doing okay but does feel tired. He has low back and leg issues.He states that his appetite is good. He has continued to have exacerbation of CHF and COPD. He now takes Lasix 60 mg BID and has to wear O2 2L nasal canula 24 hrs a day. He does become SOB easily but states that is has improved over the last week or so. He denies fever, chills, headache, dizziness, blurred vision, chest pain, palpitations, abdominal pain, constipation, diarrhea, problems urinating, blood in urine or stool. He does have a cough related to the CHF and has coughed up some white phlegm daily. He is going to try taking Robitussin DM for this. He continues on all of his medications  CURRENT TREATMENT: IV iron as indicated  REVIEW OF SYSTEMS: All other 10 point review of systems is negative except for those issues mentioned above.  PAST MEDICAL HISTORY: Past Medical History  Diagnosis Date  . Chronic systolic heart failure     a. Chronic LV systolic failure. b. Echo in Dec 2011 showed EF of 25% - f/u echo 02/2011: Mild LVH, EF 15%, posterior lateral akinesis, inferior akinesis, grade 1 diastolic dysfunction, mild LAE. c. s/p BiV pacemaker 06/2011.  [name] Atrial flutter     a. Off amiodarone because of increased LFTs  . COPD (chronic obstructive pulmonary disease)     Chronic SOB, O2 dependent  . Myocardial infarction 1994  . Gastro - esophageal reflux   . Claudication   . BPH (benign prostatic hyperplasia)   . Coronary artery disease     a.  Myocardial infarction in 1984,  99 and 07. b. H/o multiple RCA stents. c.  LHC 04/2011: without obstructive disease. oLM 25%, mLAD 30%, pCFX 25%, prox to mid RCA stent ok, dRCA 30% and 70%, pPDA 25%, mPL 25-30%, EF 25%.  [name] Hypertension   . Atrial fibrillation     a. On Xarelto.  . Hemorrhoids   . Hyperlipidemia     statin intolerant 2/2 myalgias  . Hx of adenomatous colonic polyps   . Hx of colonoscopy   . Chronic renal insufficiency   . Biventricular cardiac pacemaker in situ 3.2013  . NSVT (nonsustained ventricular tachycardia)     a. 04/2012- 6 beats on tele.  [name] Shortness of breath   . Arthritis     hips ,back ,hands  . Iron deficiency anemia, unspecified 03/20/2013   PAST SURGICAL HISTORY: Past Surgical History  Procedure Laterality Date  . Cardiac catheterization  2008  . Cataract extraction    . Salivary gland surgery    . Coronary angioplasty with stent placement    . Insert / replace / remove pacemaker  06/16/2011  . Tee without cardioversion  05/08/2012    Procedure: TRANSESOPHAGEAL ECHOCARDIOGRAM (TEE);  Surgeon: Vesta Mixer, MD;  Location: [facility] ENDOSCOPY;  Service: Cardiovascular;  Laterality: N/A;   FAMILY HISTORY Family History  Problem Relation Age of Onset  . Coronary artery disease Father   . Heart disease Mother   . Colon cancer Neg Hx   .  Heart disease Brother    GYNECOLOGIC HISTORY:  No LMP for male patient.   SOCIAL HISTORY:  History   Social History  . Marital Status: Married    Spouse Name: N/A    Number of Children: N/A  . Years of Education: N/A   Occupational History  . retired Designer, industrial/productadministrative work    Social History Main Topics  . Smoking status: Former Smoker -- 1.50 packs/day for 55 years    Types: Cigarettes    Start date: 05/21/1954    Quit date: 01/03/2009  . Smokeless tobacco: Never Used     Comment: quit 4 years ago  . Alcohol Use: No     Comment: occasional  . Drug Use: No  . Sexual Activity: Not Currently   Other Topics  Concern  . Not on file   Social History Narrative  . No narrative on file   ADVANCED DIRECTIVES: <no information>  HEALTH MAINTENANCE: History  Substance Use Topics  . Smoking status: Former Smoker -- 1.50 packs/day for 55 years    Types: Cigarettes    Start date: 05/21/1954    Quit date: 01/03/2009  . Smokeless tobacco: Never Used     Comment: quit 4 years ago  . Alcohol Use: No     Comment: occasional   Colonoscopy: PAP: Bone density: Lipid panel:  Allergies  Allergen Reactions  . Codeine Nausea And Vomiting  . Inspra [Eplerenone] Other (See Comments)    Stomach problems  . Spironolactone Other (See Comments)    Gynecomastia  . Doxycycline Itching and Rash  . Sulfonamide Derivatives Itching and Rash   Current Outpatient Prescriptions  Medication Sig Dispense Refill  . ADVAIR DISKUS 250-50 MCG/DOSE AEPB USE ONE INHALATION INTO THE LUNGS TWICE A DAY  180 each  1  . albuterol (PROVENTIL HFA;VENTOLIN HFA) 108 (90 BASE) MCG/ACT inhaler Inhale 1 puff into the lungs every 6 (six) hours as needed for wheezing or shortness of breath.      [name]. albuterol (PROVENTIL) (5 MG/ML) 0.5% nebulizer solution Take 2.5 mg by nebulization every 6 (six) hours as needed for wheezing or shortness of breath.      . ALPRAZolam (XANAX) 0.25 MG tablet Take 0.25 mg by mouth daily as needed. For anxiety.      . carvedilol (COREG) 25 MG tablet Take 25 mg by mouth 2 (two) times daily with a meal.      . esomeprazole (NEXIUM) 40 MG capsule Take twice a day  180 capsule  3  . fluticasone (FLONASE) 50 MCG/ACT nasal spray Place 2 sprays into both nostrils as needed for allergies.  16 g  11  . furosemide (LASIX) 40 MG tablet Take 1.5 tablets (60 mg total) by mouth 2 (two) times daily. (60 mg ) am (20 mg ) pm as directed  135 tablet  3  . furosemide (LASIX) 40 MG tablet ( 60 mg ) am ( 20 mg ) in the pm as directed      . hydrALAZINE (APRESOLINE) 25 MG tablet Take 25 mg in am, Take 25 in the afternoon, and  12.5mg  at bedtime  225 tablet  1  . HYDROcodone-acetaminophen (NORCO/VICODIN) 5-325 MG per tablet Take 1 tablet by mouth every 6 (six) hours. Arthritis pain      . ipratropium (ATROVENT) 0.02 % nebulizer solution Take 2.5 mLs (500 mcg total) by nebulization 4 (four) times daily.  75 mL  12  . losartan (COZAAR) 50 MG tablet TAKE 1 TABLET TWICE A DAY  180 tablet  0  . meclizine (ANTIVERT) 25 MG tablet Take a 1/2 or 1 tablet up to 3x a day as needed  30 tablet  1  . nitroGLYCERIN (NITROSTAT) 0.4 MG SL tablet Place 0.4 mg under the tongue every 5 (five) minutes as needed for chest pain.      . NON FORMULARY Place 2 L into the nose daily.      . potassium chloride (KLOR-CON M10) 10 MEQ tablet Take 1 tablet (10 mEq total) by mouth daily.  90 tablet  3  . rivaroxaban (XARELTO) 20 MG TABS tablet Take 1 tablet (20 mg total) by mouth daily with supper.  90 tablet  3  . Tamsulosin HCl (FLOMAX) 0.4 MG CAPS Take 0.4 mg by mouth 2 (two) times daily.       . [DISCONTINUED] metoprolol tartrate (LOPRESSOR) 25 MG tablet Take 1/2 tablet every other day   45 tablet  3   No current facility-administered medications for this visit.   OBJECTIVE: Filed Vitals:   10/12/13 1015  BP: 127/41  Pulse: 64  Temp: 97.5 F (36.4 C)  Resp: 20   Body mass index is 26.83 kg/(m^2). ECOG FS:0 - Asymptomatic Ocular: Sclerae unicteric, pupils equal, round and reactive to light Ear-nose-throat: Oropharynx clear, dentition fair Lymphatic: No cervical or supraclavicular adenopathy Lungs course and diminished bilaterally Heart regular rate and rhythm, no murmur appreciated Abd soft, nontender, positive bowel sounds MSK no focal spinal tenderness, no joint edema Neuro: non-focal, well-oriented, appropriate affect  LAB RESULTS: CMP     Component Value Date/Time   NA 131* 09/27/2013 1634   NA 136 01/17/2013 1337   K 4.2 09/27/2013 1634   K 4.5 01/17/2013 1337   CL 94* 09/27/2013 1634   CL 97* 01/17/2013 1337   CO2 26  09/27/2013 1634   CO2 31 01/17/2013 1337   GLUCOSE 103* 09/27/2013 1634   GLUCOSE 94 01/17/2013 1337   BUN 22 09/27/2013 1634   BUN 18 01/17/2013 1337   CREATININE 1.6* 09/27/2013 1634   CREATININE 1.33 09/12/2013 1155   CALCIUM 8.5 09/27/2013 1634   CALCIUM 8.9 01/17/2013 1337   PROT 6.5 08/13/2013 1149   ALBUMIN 3.8 08/13/2013 1149   AST 14 08/13/2013 1149   ALT <8 08/13/2013 1149   ALKPHOS 70 08/13/2013 1149   BILITOT 0.6 08/13/2013 1149   GFRNONAA 51* 06/27/2013 0622   GFRAA 59* 06/27/2013 0622   No results found for this basename: SPEP, UPEP,  kappa and lambda light chains   Lab Results  Component Value Date   WBC 6.6 10/12/2013   NEUTROABS 5.2 10/12/2013   HGB 10.8* 10/12/2013   HCT 32.7* 10/12/2013   MCV 98 10/12/2013   PLT 242 10/12/2013   No results found for this basename: LABCA2   No components found with this basename: [id number]   No results found for this basename: INR,  in the last 168 hours  STUDIES: None  ASSESSMENT/PLAN: Andrew [name] is a very pleasant 80 year old gentleman with both iron deficiency and low erythropoietin level. He always responds to IV iron, his last infusion was in March.    We discussed his CBC in detail. All other labs are pending. We will wait for the results of his iron studies to determine if he needs an infusion.   We will see him back in 6 weeks for labs and a follow-up.   All questions answered. The patient and his wife are in agreement with the plan.  He knows  to call with any questions or concerns and to go to the ED in the event of an emergency. We can certainly see him sooner if need be.   Verdie Mosher, NP 10/12/2013 2:06 PM

## 2013-10-15 ENCOUNTER — Other Ambulatory Visit: Payer: Self-pay | Admitting: *Deleted

## 2013-10-15 ENCOUNTER — Telehealth: Payer: Self-pay

## 2013-10-15 ENCOUNTER — Telehealth: Payer: Self-pay | Admitting: *Deleted

## 2013-10-15 DIAGNOSIS — D509 Iron deficiency anemia, unspecified: Secondary | ICD-10-CM

## 2013-10-15 MED ORDER — FUROSEMIDE 40 MG PO TABS: ORAL_TABLET | ORAL | Status: DC

## 2013-10-15 NOTE — Telephone Encounter (Signed)
Patient says he has not received a refill of advair from [facility]. He wants to know if we can refill it

## 2013-10-15 NOTE — Telephone Encounter (Addendum)
Message copied by Burnett CorrenteUMLEY, Aerik Polan L on Mon Oct 15, 2013 11:44 AM ------      Message from: Josph MachoENNEVER, PETER R      Created: Fri Oct 12, 2013 10:34 PM       Call - iron is down again.  Need feraheme 1020mg  x 1 dose.  Set up next week.  pete ------Informed pt that iron is low. Set pt up with appt for IV iron on July 22nd at 2pm.

## 2013-10-16 ENCOUNTER — Telehealth: Payer: Self-pay | Admitting: Hematology & Oncology

## 2013-10-16 MED ORDER — FLUTICASONE-SALMETEROL 250-50 MCG/DOSE IN AEPB: INHALATION_SPRAY | RESPIRATORY_TRACT | Status: DC

## 2013-10-16 NOTE — Telephone Encounter (Signed)
Patient's wife called and cx 10/24/13 apt and resch for 10/19/13

## 2013-10-16 NOTE — Telephone Encounter (Signed)
Sent refill again that was sent 10/05/13.

## 2013-10-19 ENCOUNTER — Ambulatory Visit (HOSPITAL_BASED_OUTPATIENT_CLINIC_OR_DEPARTMENT_OTHER): Payer: Medicare Other

## 2013-10-19 VITALS — BP 127/59 | HR 59 | Temp 96.5°F | Resp 18

## 2013-10-19 DIAGNOSIS — D509 Iron deficiency anemia, unspecified: Secondary | ICD-10-CM

## 2013-10-19 MED ORDER — SODIUM CHLORIDE 0.9 % IV SOLN
1020.0000 mg | Freq: Once | INTRAVENOUS | Status: AC
  Administered 2013-10-19: 1020 mg via INTRAVENOUS
  Filled 2013-10-19: qty 34

## 2013-10-19 MED ORDER — SODIUM CHLORIDE 0.9 % IV SOLN
INTRAVENOUS | Status: DC
  Administered 2013-10-19: 14:00:00 via INTRAVENOUS

## 2013-10-19 NOTE — Patient Instructions (Signed)

## 2013-10-22 ENCOUNTER — Other Ambulatory Visit: Payer: Self-pay

## 2013-10-22 ENCOUNTER — Inpatient Hospital Stay (HOSPITAL_COMMUNITY)
Admission: EM | Admit: 2013-10-22 | Discharge: 2013-10-27 | DRG: 291 | Disposition: A | Discharge status: Home / Self Care | Payer: Medicare Other | Attending: Internal Medicine | Admitting: Internal Medicine

## 2013-10-22 ENCOUNTER — Encounter (HOSPITAL_COMMUNITY): Payer: Self-pay | Admitting: Emergency Medicine

## 2013-10-22 ENCOUNTER — Emergency Department (HOSPITAL_COMMUNITY): Payer: Medicare Other

## 2013-10-22 DIAGNOSIS — Z950 Presence of cardiac pacemaker: Secondary | ICD-10-CM | POA: Diagnosis present

## 2013-10-22 DIAGNOSIS — D649 Anemia, unspecified: Secondary | ICD-10-CM | POA: Diagnosis present

## 2013-10-22 DIAGNOSIS — R0989 Other specified symptoms and signs involving the circulatory and respiratory systems: Secondary | ICD-10-CM

## 2013-10-22 DIAGNOSIS — I2589 Other forms of chronic ischemic heart disease: Secondary | ICD-10-CM | POA: Diagnosis present

## 2013-10-22 DIAGNOSIS — I5022 Chronic systolic (congestive) heart failure: Secondary | ICD-10-CM

## 2013-10-22 DIAGNOSIS — I5021 Acute systolic (congestive) heart failure: Secondary | ICD-10-CM

## 2013-10-22 DIAGNOSIS — N400 Enlarged prostate without lower urinary tract symptoms: Secondary | ICD-10-CM | POA: Diagnosis present

## 2013-10-22 DIAGNOSIS — J440 Chronic obstructive pulmonary disease with acute lower respiratory infection: Secondary | ICD-10-CM | POA: Diagnosis present

## 2013-10-22 DIAGNOSIS — R5383 Other fatigue: Secondary | ICD-10-CM

## 2013-10-22 DIAGNOSIS — R0600 Dyspnea, unspecified: Secondary | ICD-10-CM

## 2013-10-22 DIAGNOSIS — R0609 Other forms of dyspnea: Secondary | ICD-10-CM

## 2013-10-22 DIAGNOSIS — I2510 Atherosclerotic heart disease of native coronary artery without angina pectoris: Secondary | ICD-10-CM | POA: Diagnosis present

## 2013-10-22 DIAGNOSIS — I252 Old myocardial infarction: Secondary | ICD-10-CM

## 2013-10-22 DIAGNOSIS — Z87891 Personal history of nicotine dependence: Secondary | ICD-10-CM | POA: Diagnosis not present

## 2013-10-22 DIAGNOSIS — E785 Hyperlipidemia, unspecified: Secondary | ICD-10-CM | POA: Diagnosis present

## 2013-10-22 DIAGNOSIS — J9620 Acute and chronic respiratory failure, unspecified whether with hypoxia or hypercapnia: Secondary | ICD-10-CM | POA: Diagnosis present

## 2013-10-22 DIAGNOSIS — Z9861 Coronary angioplasty status: Secondary | ICD-10-CM

## 2013-10-22 DIAGNOSIS — I472 Ventricular tachycardia: Secondary | ICD-10-CM

## 2013-10-22 DIAGNOSIS — J9621 Acute and chronic respiratory failure with hypoxia: Secondary | ICD-10-CM

## 2013-10-22 DIAGNOSIS — I4891 Unspecified atrial fibrillation: Secondary | ICD-10-CM | POA: Diagnosis present

## 2013-10-22 DIAGNOSIS — R5381 Other malaise: Secondary | ICD-10-CM | POA: Diagnosis present

## 2013-10-22 DIAGNOSIS — Z86010 Personal history of colonic polyps: Secondary | ICD-10-CM

## 2013-10-22 DIAGNOSIS — R339 Retention of urine, unspecified: Secondary | ICD-10-CM | POA: Diagnosis present

## 2013-10-22 DIAGNOSIS — I255 Ischemic cardiomyopathy: Secondary | ICD-10-CM | POA: Diagnosis present

## 2013-10-22 DIAGNOSIS — J441 Chronic obstructive pulmonary disease with (acute) exacerbation: Secondary | ICD-10-CM

## 2013-10-22 DIAGNOSIS — Z9981 Dependence on supplemental oxygen: Secondary | ICD-10-CM | POA: Diagnosis not present

## 2013-10-22 DIAGNOSIS — J9611 Chronic respiratory failure with hypoxia: Secondary | ICD-10-CM

## 2013-10-22 DIAGNOSIS — I4892 Unspecified atrial flutter: Secondary | ICD-10-CM | POA: Diagnosis present

## 2013-10-22 DIAGNOSIS — Z8639 Personal history of other endocrine, nutritional and metabolic disease: Secondary | ICD-10-CM

## 2013-10-22 DIAGNOSIS — I10 Essential (primary) hypertension: Secondary | ICD-10-CM | POA: Diagnosis present

## 2013-10-22 DIAGNOSIS — I5023 Acute on chronic systolic (congestive) heart failure: Principal | ICD-10-CM | POA: Diagnosis present

## 2013-10-22 DIAGNOSIS — Z862 Personal history of diseases of the blood and blood-forming organs and certain disorders involving the immune mechanism: Secondary | ICD-10-CM

## 2013-10-22 DIAGNOSIS — J209 Acute bronchitis, unspecified: Secondary | ICD-10-CM | POA: Diagnosis present

## 2013-10-22 DIAGNOSIS — J189 Pneumonia, unspecified organism: Secondary | ICD-10-CM | POA: Diagnosis present

## 2013-10-22 DIAGNOSIS — D509 Iron deficiency anemia, unspecified: Secondary | ICD-10-CM

## 2013-10-22 DIAGNOSIS — I509 Heart failure, unspecified: Secondary | ICD-10-CM | POA: Diagnosis present

## 2013-10-22 DIAGNOSIS — J9610 Chronic respiratory failure, unspecified whether with hypoxia or hypercapnia: Secondary | ICD-10-CM | POA: Diagnosis present

## 2013-10-22 DIAGNOSIS — K219 Gastro-esophageal reflux disease without esophagitis: Secondary | ICD-10-CM | POA: Diagnosis present

## 2013-10-22 DIAGNOSIS — I4729 Other ventricular tachycardia: Secondary | ICD-10-CM

## 2013-10-22 DIAGNOSIS — R930 Abnormal findings on diagnostic imaging of skull and head, not elsewhere classified: Secondary | ICD-10-CM

## 2013-10-22 LAB — CBC
HCT: 32.2 % — ABNORMAL LOW (ref 39.0–52.0)
HEMOGLOBIN: 10.5 g/dL — AB (ref 13.0–17.0)
MCH: 31.2 pg (ref 26.0–34.0)
MCHC: 32.6 g/dL (ref 30.0–36.0)
MCV: 95.5 fL (ref 78.0–100.0)
Platelets: 268 10*3/uL (ref 150–400)
RBC: 3.37 MIL/uL — ABNORMAL LOW (ref 4.22–5.81)
RDW: 13.6 % (ref 11.5–15.5)
WBC: 8 10*3/uL (ref 4.0–10.5)

## 2013-10-22 LAB — BASIC METABOLIC PANEL
Anion gap: 17 — ABNORMAL HIGH (ref 5–15)
BUN: 18 mg/dL (ref 6–23)
CO2: 22 mEq/L (ref 19–32)
CREATININE: 1.16 mg/dL (ref 0.50–1.35)
Calcium: 8.6 mg/dL (ref 8.4–10.5)
Chloride: 92 mEq/L — ABNORMAL LOW (ref 96–112)
GFR calc Af Amer: 66 mL/min — ABNORMAL LOW (ref >90)
GFR, EST NON AFRICAN AMERICAN: 57 mL/min — AB (ref >90)
GLUCOSE: 115 mg/dL — AB (ref 70–99)
Potassium: 4.4 mEq/L (ref 3.7–5.3)
Sodium: 131 mEq/L — ABNORMAL LOW (ref 137–147)

## 2013-10-22 LAB — PRO B NATRIURETIC PEPTIDE: Pro B Natriuretic peptide (BNP): 6264 pg/mL — ABNORMAL HIGH (ref 0–450)

## 2013-10-22 LAB — I-STAT TROPONIN, ED: Troponin i, poc: 0.03 ng/mL (ref 0.00–0.08)

## 2013-10-22 MED ORDER — ALBUTEROL SULFATE (2.5 MG/3ML) 0.083% IN NEBU
2.5000 mg | INHALATION_SOLUTION | Freq: Four times a day (QID) | RESPIRATORY_TRACT | Status: DC | PRN
  Administered 2013-10-22: 2.5 mg via RESPIRATORY_TRACT
  Filled 2013-10-22: qty 3

## 2013-10-22 MED ORDER — HYDRALAZINE HCL 25 MG PO TABS
25.0000 mg | ORAL_TABLET | Freq: Two times a day (BID) | ORAL | Status: DC
  Administered 2013-10-23 – 2013-10-26 (×7): 25 mg via ORAL
  Filled 2013-10-22 (×9): qty 1

## 2013-10-22 MED ORDER — SODIUM CHLORIDE 0.9 % IJ SOLN
3.0000 mL | Freq: Two times a day (BID) | INTRAMUSCULAR | Status: DC
  Administered 2013-10-22 – 2013-10-27 (×8): 3 mL via INTRAVENOUS

## 2013-10-22 MED ORDER — ONDANSETRON HCL 4 MG/2ML IJ SOLN: 4.0000 mg | Freq: Three times a day (TID) | INTRAMUSCULAR | Status: DC | PRN

## 2013-10-22 MED ORDER — CARVEDILOL 25 MG PO TABS
25.0000 mg | ORAL_TABLET | Freq: Two times a day (BID) | ORAL | Status: DC
  Administered 2013-10-22 – 2013-10-27 (×10): 25 mg via ORAL
  Filled 2013-10-22 (×13): qty 1

## 2013-10-22 MED ORDER — HYDROCODONE-ACETAMINOPHEN 5-325 MG PO TABS
1.0000 | ORAL_TABLET | Freq: Four times a day (QID) | ORAL | Status: DC
  Administered 2013-10-23 – 2013-10-27 (×13): 1 via ORAL
  Filled 2013-10-22 (×13): qty 1

## 2013-10-22 MED ORDER — ALBUTEROL SULFATE HFA 108 (90 BASE) MCG/ACT IN AERS: 1.0000 | INHALATION_SPRAY | Freq: Four times a day (QID) | RESPIRATORY_TRACT | Status: DC | PRN

## 2013-10-22 MED ORDER — RIVAROXABAN 20 MG PO TABS
20.0000 mg | ORAL_TABLET | Freq: Every day | ORAL | Status: DC
  Administered 2013-10-22 – 2013-10-23 (×2): 20 mg via ORAL
  Filled 2013-10-22 (×3): qty 1

## 2013-10-22 MED ORDER — FUROSEMIDE 10 MG/ML IJ SOLN
40.0000 mg | Freq: Four times a day (QID) | INTRAMUSCULAR | Status: DC
  Administered 2013-10-23 (×3): 40 mg via INTRAVENOUS
  Filled 2013-10-22 (×4): qty 4

## 2013-10-22 MED ORDER — SODIUM CHLORIDE 0.9 % IV SOLN: 250.0000 mL | INTRAVENOUS | Status: DC | PRN

## 2013-10-22 MED ORDER — FLUTICASONE PROPIONATE 50 MCG/ACT NA SUSP
2.0000 | Freq: Every day | NASAL | Status: DC | PRN
  Administered 2013-10-26: 2 via NASAL
  Filled 2013-10-22: qty 16

## 2013-10-22 MED ORDER — POTASSIUM CHLORIDE CRYS ER 20 MEQ PO TBCR: 10.0000 meq | EXTENDED_RELEASE_TABLET | ORAL | Status: DC

## 2013-10-22 MED ORDER — MECLIZINE HCL 25 MG PO TABS
12.5000 mg | ORAL_TABLET | Freq: Three times a day (TID) | ORAL | Status: DC | PRN
  Filled 2013-10-22: qty 1

## 2013-10-22 MED ORDER — MOMETASONE FURO-FORMOTEROL FUM 100-5 MCG/ACT IN AERO
2.0000 | INHALATION_SPRAY | Freq: Two times a day (BID) | RESPIRATORY_TRACT | Status: DC
  Administered 2013-10-22 – 2013-10-27 (×9): 2 via RESPIRATORY_TRACT
  Filled 2013-10-22: qty 8.8

## 2013-10-22 MED ORDER — NITROGLYCERIN 0.4 MG SL SUBL: 0.4000 mg | SUBLINGUAL_TABLET | SUBLINGUAL | Status: DC | PRN

## 2013-10-22 MED ORDER — TAMSULOSIN HCL 0.4 MG PO CAPS
0.4000 mg | ORAL_CAPSULE | Freq: Two times a day (BID) | ORAL | Status: DC
  Administered 2013-10-22 – 2013-10-27 (×10): 0.4 mg via ORAL
  Filled 2013-10-22 (×12): qty 1

## 2013-10-22 MED ORDER — ONDANSETRON HCL 4 MG/2ML IJ SOLN
4.0000 mg | Freq: Four times a day (QID) | INTRAMUSCULAR | Status: DC | PRN
  Administered 2013-10-26: 4 mg via INTRAVENOUS
  Filled 2013-10-22: qty 2

## 2013-10-22 MED ORDER — HYDRALAZINE HCL 25 MG PO TABS
12.5000 mg | ORAL_TABLET | Freq: Every day | ORAL | Status: DC
  Administered 2013-10-22 – 2013-10-25 (×4): 12.5 mg via ORAL
  Filled 2013-10-22 (×6): qty 0.5

## 2013-10-22 MED ORDER — ALBUTEROL SULFATE (2.5 MG/3ML) 0.083% IN NEBU: 2.5000 mg | INHALATION_SOLUTION | Freq: Four times a day (QID) | RESPIRATORY_TRACT | Status: DC | PRN

## 2013-10-22 MED ORDER — AZITHROMYCIN 250 MG PO TABS
500.0000 mg | ORAL_TABLET | Freq: Once | ORAL | Status: AC
  Administered 2013-10-22: 500 mg via ORAL
  Filled 2013-10-22: qty 2

## 2013-10-22 MED ORDER — PANTOPRAZOLE SODIUM 40 MG PO TBEC
80.0000 mg | DELAYED_RELEASE_TABLET | Freq: Every day | ORAL | Status: DC
  Administered 2013-10-23 – 2013-10-27 (×5): 80 mg via ORAL
  Filled 2013-10-22 (×5): qty 2

## 2013-10-22 MED ORDER — ALBUTEROL SULFATE (2.5 MG/3ML) 0.083% IN NEBU
2.5000 mg | INHALATION_SOLUTION | Freq: Four times a day (QID) | RESPIRATORY_TRACT | Status: DC
  Administered 2013-10-23: 2.5 mg via RESPIRATORY_TRACT
  Filled 2013-10-22: qty 3

## 2013-10-22 MED ORDER — LOSARTAN POTASSIUM 50 MG PO TABS
50.0000 mg | ORAL_TABLET | Freq: Two times a day (BID) | ORAL | Status: DC
  Administered 2013-10-22 – 2013-10-25 (×7): 50 mg via ORAL
  Filled 2013-10-22 (×10): qty 1

## 2013-10-22 MED ORDER — FUROSEMIDE 10 MG/ML IJ SOLN
40.0000 mg | Freq: Once | INTRAMUSCULAR | Status: AC
  Administered 2013-10-22: 40 mg via INTRAVENOUS
  Filled 2013-10-22: qty 4

## 2013-10-22 MED ORDER — IPRATROPIUM BROMIDE 0.02 % IN SOLN
500.0000 ug | Freq: Four times a day (QID) | RESPIRATORY_TRACT | Status: DC
  Administered 2013-10-22: 500 ug via RESPIRATORY_TRACT
  Filled 2013-10-22: qty 2.5

## 2013-10-22 MED ORDER — ALBUTEROL SULFATE (5 MG/ML) 0.5% IN NEBU: 2.5000 mg | INHALATION_SOLUTION | Freq: Four times a day (QID) | RESPIRATORY_TRACT | Status: DC | PRN

## 2013-10-22 MED ORDER — ALPRAZOLAM 0.25 MG PO TABS: 0.2500 mg | ORAL_TABLET | Freq: Every day | ORAL | Status: DC | PRN

## 2013-10-22 MED ORDER — SODIUM CHLORIDE 0.9 % IJ SOLN: 3.0000 mL | INTRAMUSCULAR | Status: DC | PRN

## 2013-10-22 MED ORDER — DEXTROSE 5 % IV SOLN
1.0000 g | Freq: Once | INTRAVENOUS | Status: DC
  Filled 2013-10-22: qty 10

## 2013-10-22 MED ORDER — ACETAMINOPHEN 325 MG PO TABS: 650.0000 mg | ORAL_TABLET | ORAL | Status: DC | PRN

## 2013-10-22 NOTE — H&P (Signed)
[facility] History and Physical  Andrew [name] ZOX:[id number] DOB: Aug 02, 1930 DOA: 10/22/2013  Referring physician: EDP PCP: Lucilla Edin, MD   Chief Complaint: SOB   HPI: Andrew [name] is a 80 y.o. male with h/o CHF, presents to ED with worsening SOB and feelings of fluid overload.  Symptoms have been worse over the past 2-3 days, gradually worsening despite increasing PO lasix to 80mg  BID.  Symptoms similar to previous CHF exacerbations.  No fevers, no chills, non-productive cough, does have orthopnea.  Leg swelling is bilateral.  Taking meds as directed he states.  Review of Systems: Systems reviewed.  As above, otherwise negative  Past Medical History  Diagnosis Date  . Chronic systolic heart failure     a. Chronic LV systolic failure. b. Echo in Dec 2011 showed EF of 25% - f/u echo 02/2011: Mild LVH, EF 15%, posterior lateral akinesis, inferior akinesis, grade 1 diastolic dysfunction, mild LAE. c. s/p BiV pacemaker 06/2011.  [name] Atrial flutter     a. Off amiodarone because of increased LFTs  . COPD (chronic obstructive pulmonary disease)     Chronic SOB, O2 dependent  . Myocardial infarction 1994  . Gastro - esophageal reflux   . Claudication   . BPH (benign prostatic hyperplasia)   . Coronary artery disease     a. Myocardial infarction in 1984,  99 and 07. b. H/o multiple RCA stents. c.  LHC 04/2011: without obstructive disease. oLM 25%, mLAD 30%, pCFX 25%, prox to mid RCA stent ok, dRCA 30% and 70%, pPDA 25%, mPL 25-30%, EF 25%.  [name] Hypertension   . Atrial fibrillation     a. On Xarelto.  . Hemorrhoids   . Hyperlipidemia     statin intolerant 2/2 myalgias  . Hx of adenomatous colonic polyps   . Hx of colonoscopy   . Chronic renal insufficiency   . Biventricular cardiac pacemaker in situ 3.2013  . NSVT (nonsustained ventricular tachycardia)     a. 04/2012- 6 beats on tele.  [name] Shortness of breath   . Arthritis     hips ,back ,hands  . Iron deficiency anemia,  unspecified 03/20/2013   Past Surgical History  Procedure Laterality Date  . Cardiac catheterization  2008  . Cataract extraction    . Salivary gland surgery    . Coronary angioplasty with stent placement    . Insert / replace / remove pacemaker  06/16/2011  . Tee without cardioversion  05/08/2012    Procedure: TRANSESOPHAGEAL ECHOCARDIOGRAM (TEE);  Surgeon: Vesta Mixer, MD;  Location: [facility] ENDOSCOPY;  Service: Cardiovascular;  Laterality: N/A;   Social History:  reports that he quit smoking about 4 years ago. His smoking use included Cigarettes. He started smoking about 59 years ago. He has a 82.5 pack-year smoking history. He has never used smokeless tobacco. He reports that he does not drink alcohol or use illicit drugs.  Allergies  Allergen Reactions  . Codeine Nausea And Vomiting  . Inspra [Eplerenone] Other (See Comments)    Stomach problems  . Spironolactone Other (See Comments)    Gynecomastia  . Doxycycline Itching and Rash  . Sulfonamide Derivatives Itching and Rash    Family History  Problem Relation Age of Onset  . Coronary artery disease Father   . Heart disease Mother   . Colon cancer Neg Hx   . Heart disease Brother      Prior to Admission medications   Medication Sig Start Date End Date Taking? Authorizing Provider  albuterol (PROVENTIL HFA;VENTOLIN HFA) 108 (90 BASE) MCG/ACT inhaler Inhale 1 puff into the lungs every 6 (six) hours as needed for wheezing or shortness of breath.   Yes Historical Provider, MD  albuterol (PROVENTIL) (5 MG/ML) 0.5% nebulizer solution Take 2.5 mg by nebulization every 6 (six) hours as needed for wheezing or shortness of breath.   Yes Historical Provider, MD  ALPRAZolam (XANAX) 0.25 MG tablet Take 0.25 mg by mouth daily as needed. For anxiety. 06/23/10  Yes Roger Shelter, MD  carvedilol (COREG) 25 MG tablet Take 25 mg by mouth 2 (two) times daily with a meal.   Yes Historical Provider, MD  esomeprazole (NEXIUM) 40 MG capsule Take 40  mg by mouth 2 (two) times daily before a meal.   Yes Historical Provider, MD  fluticasone (FLONASE) 50 MCG/ACT nasal spray Place 2 sprays into both nostrils as needed for allergies. 07/04/13 07/10/14 Yes [name], MD  Fluticasone-Salmeterol (ADVAIR) 250-50 MCG/DOSE AEPB Inhale 1 puff into the lungs 2 (two) times daily.   Yes Historical Provider, MD  furosemide (LASIX) 40 MG tablet Take 20-80 mg by mouth 2 (two) times daily as needed for fluid.   Yes Historical Provider, MD  hydrALAZINE (APRESOLINE) 25 MG tablet Take 12.5-25 mg by mouth 3 (three) times daily. Take 25mg  in AM, 25mg  in Afternoon, and 12.5mg  at bedtime   Yes Historical Provider, MD  HYDROcodone-acetaminophen (NORCO/VICODIN) 5-325 MG per tablet Take 1 tablet by mouth every 6 (six) hours. Arthritis pain   Yes Historical Provider, MD  ipratropium (ATROVENT) 0.02 % nebulizer solution Take 2.5 mLs (500 mcg total) by nebulization 4 (four) times daily. 10/02/12  Yes Eleanore E Egan, PA-C  losartan (COZAAR) 50 MG tablet Take 50 mg by mouth 2 (two) times daily.   Yes Historical Provider, MD  meclizine (ANTIVERT) 25 MG tablet Take 12.5-25 mg by mouth 3 (three) times daily as needed for dizziness.   Yes Historical Provider, MD  nitroGLYCERIN (NITROSTAT) 0.4 MG SL tablet Place 0.4 mg under the tongue every 5 (five) minutes as needed for chest pain.   Yes Historical Provider, MD  NON FORMULARY Place 2 L into the nose daily.   Yes Historical Provider, MD  potassium chloride (K-DUR,KLOR-CON) 10 MEQ tablet Take 10 mEq by mouth every other day.   Yes Historical Provider, MD  rivaroxaban (XARELTO) 20 MG TABS tablet Take 1 tablet (20 mg total) by mouth daily with supper. 08/10/13  Yes Rosalio Macadamia, NP  Tamsulosin HCl (FLOMAX) 0.4 MG CAPS Take 0.4 mg by mouth 2 (two) times daily.    Yes Historical Provider, MD   Physical Exam: Filed Vitals:   10/22/13 1945  BP: 171/91  Pulse: 75  Temp:   Resp: 21    BP 171/91  Pulse 75  Temp(Src) 97.6 F (36.4  C) (Oral)  Resp 21  Wt 83.915 kg (185 lb)  SpO2 96%  General Appearance:    Alert, oriented, no distress, appears stated age  Head:    Normocephalic, atraumatic  Eyes:    PERRL, EOMI, sclera non-icteric        Nose:   Nares without drainage or epistaxis. Mucosa, turbinates normal  Throat:   Moist mucous membranes. Oropharynx without erythema or exudate.  Neck:   Supple. No carotid bruits.  No thyromegaly.  No lymphadenopathy.   Back:     No CVA tenderness, no spinal tenderness  Lungs:     Bi-basilar rales  Chest wall:    No tenderness to  palpitation  Heart:    Regular rate and rhythm without murmurs, gallops, rubs  Abdomen:     Soft, non-tender, mild distention, normal bowel sounds, no organomegaly  Genitalia:    deferred  Rectal:    deferred  Extremities:   1+ pitting edema BLE  Pulses:   2+ and symmetric all extremities  Skin:   Skin color, texture, turgor normal, no rashes or lesions  Lymph nodes:   Cervical, supraclavicular, and axillary nodes normal  Neurologic:   CNII-XII intact. Normal strength, sensation and reflexes      throughout    Labs on Admission:  Basic Metabolic Panel:  Recent Labs Lab 10/22/13 1845  NA 131*  K 4.4  CL 92*  CO2 22  GLUCOSE 115*  BUN 18  CREATININE 1.16  CALCIUM 8.6   Liver Function Tests: No results found for this basename: AST, ALT, ALKPHOS, BILITOT, PROT, ALBUMIN,  in the last 168 hours No results found for this basename: LIPASE, AMYLASE,  in the last 168 hours No results found for this basename: AMMONIA,  in the last 168 hours CBC:  Recent Labs Lab 10/22/13 1845  WBC 8.0  HGB 10.5*  HCT 32.2*  MCV 95.5  PLT 268   Cardiac Enzymes: No results found for this basename: CKTOTAL, CKMB, CKMBINDEX, TROPONINI,  in the last 168 hours  BNP (last 3 results)  Recent Labs  06/25/13 1714 10/10/13 1507 10/22/13 1845  PROBNP 6196.0* 1021.0* 6264.0*   CBG: No results found for this basename: GLUCAP,  in the last 168  hours  Radiological Exams on Admission: Dg Chest Port 1 View  10/22/2013   CLINICAL DATA:  Shortness of breath worsening over the past 3 days. History of congestive heart failure and COPD.  EXAM: PORTABLE CHEST - 1 VIEW  COMPARISON:  Chest x-ray 09/21/2013.  FINDINGS: Diffuse patchy interstitial prominence and patchy areas of peribronchial cuffing, most severe in the perihilar aspects of the lungs and in the medial aspect of the right lung base. No confluent consolidative airspace disease on this single view examination. No pleural effusions. No evidence of pulmonary edema. Heart size is borderline enlarged. Upper mediastinal contours are within normal limits. Left-sided biventricular pacemaker device in position with lead tips projecting over the expected location of the right atrium, right ventricular apex and overlying the lateral wall of the left ventricle via the coronary sinus and coronary veins.  IMPRESSION: 1. The appearance of the chest suggests severe bronchitis, potentially with developing multilobar bronchopneumonia, as detailed above.   Electronically Signed   By: Trudie Reedaniel  Entrikin M.D.   On: 10/22/2013 18:36    EKG: Independently reviewed.  Assessment/Plan Principal Problem:   Acute on chronic systolic CHF (congestive heart failure) Active Problems:   Chronic systolic heart failure   Ischemic cardiomyopathy   Urinary retention   1. Acute on chronic systolic CHF - patient already reports improved respiratory symptoms in ED after 40mg  IV lasix, will continue lasix 40mg  IV q6h, on CHF pathway, recheck BMP in AM.  Tele monitor.  Suspect decreased absorption of lasix due to edema to blame for symptoms as suggested by his better than baseline kidney function today on lab work.  PRN in and out caths as he does have urinary retention.  Monitor for fever or other signs of PNA but clinically appears to be more CHF than PNA or bronchitis at this point. 2. Chronic A.Fib - continue home  xarelto    Code Status: Full  Family Communication: Wife at  bedside Disposition Plan: Admit to inpatient   Time spent: 70 min  GARDNER, JARED M. [facility] Pager [phone]  If 7AM-7PM, please contact the day team taking care of the patient [email or web address] Password [facility] 10/22/2013, 8:36 PM

## 2013-10-22 NOTE — ED Provider Notes (Signed)
CSN: [id number]     Arrival date & time 10/22/13  1740 History   First MD Initiated Contact with Patient 10/22/13 1751     Chief Complaint  Patient presents with  . Shortness of Breath     (Consider location/radiation/quality/duration/timing/severity/associated sxs/prior Treatment) HPI Comments: 80 year old male with anemia, on iron transfusions, atrial flutter, systolic heart failure, ischemic cardiomyopathy, atrial fibrillation, pacemaker, COPD presents with worsening shortness of breath and feeling fluid overloaded. Patient follows closely with Dr. Graciela HusbandsKlein and other local heart doctors and recently has been increasing his Lasix to 80 mg twice daily, normally takes 40 mg in the morning and 20 mg in the afternoon. Patient feels the past week he said gradually worsening shortness of breath with exertion and lying flat. He feels overall similar to his CHF history. He has had cough nonproductive and no fevers or chills. No current antibiotics. No recent surgery, long travel, unilateral leg swelling, blood clot history. Patient has been taking his meds as directed. Past smoker.  Patient is a 80 y.o. male presenting with shortness of breath. The history is provided by the patient.  Shortness of Breath Associated symptoms: cough   Associated symptoms: no abdominal pain, no chest pain, no fever, no headaches, no neck pain, no rash and no vomiting     Past Medical History  Diagnosis Date  . Chronic systolic heart failure     a. Chronic LV systolic failure. b. Echo in Dec 2011 showed EF of 25% - f/u echo 02/2011: Mild LVH, EF 15%, posterior lateral akinesis, inferior akinesis, grade 1 diastolic dysfunction, mild LAE. c. s/p BiV pacemaker 06/2011.  [name]. Atrial flutter     a. Off amiodarone because of increased LFTs  . COPD (chronic obstructive pulmonary disease)     Chronic SOB, O2 dependent  . Myocardial infarction 1994  . Gastro - esophageal reflux   . Claudication   . BPH (benign prostatic  hyperplasia)   . Coronary artery disease     a. Myocardial infarction in 1984,  99 and 07. b. H/o multiple RCA stents. c.  LHC 04/2011: without obstructive disease. oLM 25%, mLAD 30%, pCFX 25%, prox to mid RCA stent ok, dRCA 30% and 70%, pPDA 25%, mPL 25-30%, EF 25%.  [name]. Hypertension   . Atrial fibrillation     a. On Xarelto.  . Hemorrhoids   . Hyperlipidemia     statin intolerant 2/2 myalgias  . Hx of adenomatous colonic polyps   . Hx of colonoscopy   . Chronic renal insufficiency   . Biventricular cardiac pacemaker in situ 3.2013  . NSVT (nonsustained ventricular tachycardia)     a. 04/2012- 6 beats on tele.  [name]. Shortness of breath   . Arthritis     hips ,back ,hands  . Iron deficiency anemia, unspecified 03/20/2013   Past Surgical History  Procedure Laterality Date  . Cardiac catheterization  2008  . Cataract extraction    . Salivary gland surgery    . Coronary angioplasty with stent placement    . Insert / replace / remove pacemaker  06/16/2011  . Tee without cardioversion  05/08/2012    Procedure: TRANSESOPHAGEAL ECHOCARDIOGRAM (TEE);  Surgeon: Vesta MixerPhilip J Nahser, MD;  [facility];  Service: Cardiovascular;  Laterality: N/A;   Family History  Problem Relation Age of Onset  . Coronary artery disease Father   . Heart disease Mother   . Colon cancer Neg Hx   . Heart disease Brother    History  Substance Use  Topics  . Smoking status: Former Smoker -- 1.50 packs/day for 55 years    Types: Cigarettes    Start date: 05/21/1954    Quit date: 01/03/2009  . Smokeless tobacco: Never Used     Comment: quit 4 years ago  . Alcohol Use: No     Comment: occasional    Review of Systems  Constitutional: Negative for fever and chills.  HENT: Negative for congestion.   Eyes: Negative for visual disturbance.  Respiratory: Positive for cough and shortness of breath.   Cardiovascular: Positive for leg swelling. Negative for chest pain.  Gastrointestinal: Negative for vomiting  and abdominal pain.  Genitourinary: Negative for dysuria and flank pain.  Musculoskeletal: Negative for back pain, neck pain and neck stiffness.  Skin: Negative for rash.  Neurological: Positive for light-headedness. Negative for headaches.      Allergies  Codeine; Inspra; Spironolactone; Doxycycline; and Sulfonamide derivatives  Home Medications   Prior to Admission medications   Medication Sig Start Date End Date Taking? Authorizing Provider  albuterol (PROVENTIL HFA;VENTOLIN HFA) 108 (90 BASE) MCG/ACT inhaler Inhale 1 puff into the lungs every 6 (six) hours as needed for wheezing or shortness of breath.    Historical Provider, MD  albuterol (PROVENTIL) (5 MG/ML) 0.5% nebulizer solution Take 2.5 mg by nebulization every 6 (six) hours as needed for wheezing or shortness of breath.    Historical Provider, MD  ALPRAZolam Prudy Feeler) 0.25 MG tablet Take 0.25 mg by mouth daily as needed. For anxiety. 06/23/10   Roger Shelter, MD  carvedilol (COREG) 25 MG tablet Take 25 mg by mouth 2 (two) times daily with a meal.    Historical Provider, MD  esomeprazole (NEXIUM) 40 MG capsule Take twice a day 07/05/13   Raymon Mutton Dunn, PA-C  fluticasone (FLONASE) 50 MCG/ACT nasal spray Place 2 sprays into both nostrils as needed for allergies. 07/04/13 07/10/14  [name], MD  Fluticasone-Salmeterol (ADVAIR DISKUS) 250-50 MCG/DOSE AEPB USE ONE INHALATION INTO THE LUNGS TWICE A DAY 10/16/13   Nelva Nay, PA-C  furosemide (LASIX) 40 MG tablet (60 mg ) am (20 mg ) pm as directed 10/15/13   Rollene Rotunda, MD  hydrALAZINE (APRESOLINE) 25 MG tablet Take 25 mg in am, Take 25 in the afternoon, and 12.5mg  at bedtime 10/01/13   Duke Salvia, MD  HYDROcodone-acetaminophen (NORCO/VICODIN) 5-325 MG per tablet Take 1 tablet by mouth every 6 (six) hours. Arthritis pain    Historical Provider, MD  ipratropium (ATROVENT) 0.02 % nebulizer solution Take 2.5 mLs (500 mcg total) by nebulization 4 (four) times daily. 10/02/12    Eleanore Delia Chimes, PA-C  losartan (COZAAR) 50 MG tablet TAKE 1 TABLET TWICE A DAY    Rollene Rotunda, MD  meclizine (ANTIVERT) 25 MG tablet Take a 1/2 or 1 tablet up to 3x a day as needed 08/17/13   Gwenlyn Found Copland, MD  nitroGLYCERIN (NITROSTAT) 0.4 MG SL tablet Place 0.4 mg under the tongue every 5 (five) minutes as needed for chest pain.    Historical Provider, MD  NON FORMULARY Place 2 L into the nose daily.    Historical Provider, MD  potassium chloride (KLOR-CON M10) 10 MEQ tablet Take 1 tablet (10 mEq total) by mouth daily. 07/04/13   [name], MD  rivaroxaban (XARELTO) 20 MG TABS tablet Take 1 tablet (20 mg total) by mouth daily with supper. 08/10/13   Rosalio Macadamia, NP  Tamsulosin HCl (FLOMAX) 0.4 MG CAPS Take 0.4 mg by mouth  2 (two) times daily.     Historical Provider, MD   BP 158/75  Pulse 72  Temp(Src) 97.6 F (36.4 C) (Oral)  Resp 19  Wt 185 lb (83.915 kg)  SpO2 98% Physical Exam  Nursing note and vitals reviewed. Constitutional: He is oriented to person, place, and time. He appears well-developed and well-nourished.  HENT:  Head: Normocephalic and atraumatic.  Eyes: Conjunctivae are normal. Right eye exhibits no discharge. Left eye exhibits no discharge.  Neck: Normal range of motion. Neck supple. JVD present. No tracheal deviation present.  Cardiovascular: Normal rate and regular rhythm.   Pulmonary/Chest: He has rales (few at bases).  Abdominal: Soft. He exhibits distension (mild). There is no tenderness. There is no guarding.  Musculoskeletal: He exhibits edema.  Neurological: He is alert and oriented to person, place, and time.  Skin: Skin is warm. No rash noted.  Psychiatric: He has a normal mood and affect.    ED Course  Procedures (including critical care time) Labs Review Labs Reviewed  CBC - Abnormal; Notable for the following:    RBC 3.37 (*)    Hemoglobin 10.5 (*)    HCT 32.2 (*)    All other components within normal limits  BASIC METABOLIC PANEL -  Abnormal; Notable for the following:    Sodium 131 (*)    Chloride 92 (*)    Glucose, Bld 115 (*)    GFR calc non Af Amer 57 (*)    GFR calc Af Amer 66 (*)    Anion gap 17 (*)    All other components within normal limits  PRO B NATRIURETIC PEPTIDE - Abnormal; Notable for the following:    Pro B Natriuretic peptide (BNP) 6264.0 (*)    All other components within normal limits  I-STAT TROPOININ, ED    Imaging Review Dg Chest Port 1 View  10/22/2013   CLINICAL DATA:  Shortness of breath worsening over the past 3 days. History of congestive heart failure and COPD.  EXAM: PORTABLE CHEST - 1 VIEW  COMPARISON:  Chest x-ray 09/21/2013.  FINDINGS: Diffuse patchy interstitial prominence and patchy areas of peribronchial cuffing, most severe in the perihilar aspects of the lungs and in the medial aspect of the right lung base. No confluent consolidative airspace disease on this single view examination. No pleural effusions. No evidence of pulmonary edema. Heart size is borderline enlarged. Upper mediastinal contours are within normal limits. Left-sided biventricular pacemaker device in position with lead tips projecting over the expected location of the right atrium, right ventricular apex and overlying the lateral wall of the left ventricle via the coronary sinus and coronary veins.  IMPRESSION: 1. The appearance of the chest suggests severe bronchitis, potentially with developing multilobar bronchopneumonia, as detailed above.   Electronically Signed   By: Trudie Reed M.D.   On: 10/22/2013 18:36     EKG Interpretation None     EKG reviewed paced, heart rate 91, left  axis QRS, left axis, no acute findings. MDM   Final diagnoses:  Acute systolic congestive heart failure  Acute dyspnea   Patient with complex cardiac history and COPD presents with worsening shortness of breath leg swelling weight gain in the past. Chest x-ray reviewed is possibly pneumonia versus ear bronchitis. Blood work  pending and plan for admission this patient is requiring more oxygen his baseline which is 2 L nasal cannula he requiring 3 and is currently on 4 L nasal cannula. Lasix IV given and community antibiotics given.  Clinically acute CHF, discussed with Dr. Julian Reil for dimension to telemetry who agrees with plan. Rocephin given for possible [facility] pneumonia. Patient requiring 4 L nasal cannula in ER. The patients results and plan were reviewed and discussed.   Any x-rays performed were personally reviewed by myself.   Differential diagnosis were considered with the presenting HPI.  Medications  furosemide (LASIX) injection 40 mg (not administered)  cefTRIAXone (ROCEPHIN) 1 g in dextrose 5 % 50 mL IVPB (not administered)  azithromycin (ZITHROMAX) tablet 500 mg (not administered)    And an in and is oriented to back her head on the cool  Filed Vitals:   10/22/13 1754 10/22/13 1757 10/22/13 1815 10/22/13 1900  BP: 154/64  158/75 150/50  Pulse:   72 35  Temp: 97.6 F (36.4 C)     TempSrc: Oral     Resp: 19  19 21   Weight:  185 lb (83.915 kg)    SpO2: 99%  98% 98%    Admission/ observation were discussed with the admitting physician, patient and/or family and they are comfortable with the plan.    Enid Skeens, MD 10/22/13 2018

## 2013-10-22 NOTE — ED Notes (Signed)
Per EMS- Pt comes from home with hx of CHF, SOB worsening over last 3 days. Denies CP. Today took 160 mg Lasix with no relief, reports minimal urine output. Reports legs more swollen than normal. Wt is same. Pt is a x 4. Had labored breathing when EMS arrived, placed on NRB, is on 2.5 L oxygen at baseline. 98% on cannula. 192/102, HR 75 Paced.

## 2013-10-22 NOTE — ED Notes (Addendum)
Attempted report 

## 2013-10-22 NOTE — ED Notes (Signed)
Dr Julian ReilGardner in with patient and family.

## 2013-10-23 DIAGNOSIS — R0902 Hypoxemia: Secondary | ICD-10-CM

## 2013-10-23 DIAGNOSIS — J189 Pneumonia, unspecified organism: Secondary | ICD-10-CM

## 2013-10-23 DIAGNOSIS — I5021 Acute systolic (congestive) heart failure: Secondary | ICD-10-CM

## 2013-10-23 DIAGNOSIS — R5381 Other malaise: Secondary | ICD-10-CM

## 2013-10-23 DIAGNOSIS — J9610 Chronic respiratory failure, unspecified whether with hypoxia or hypercapnia: Secondary | ICD-10-CM

## 2013-10-23 DIAGNOSIS — R5383 Other fatigue: Secondary | ICD-10-CM

## 2013-10-23 LAB — URINALYSIS, ROUTINE W REFLEX MICROSCOPIC
BILIRUBIN URINE: NEGATIVE
Glucose, UA: NEGATIVE mg/dL
HGB URINE DIPSTICK: NEGATIVE
Ketones, ur: NEGATIVE mg/dL
Leukocytes, UA: NEGATIVE
Nitrite: NEGATIVE
PROTEIN: 100 mg/dL — AB
Specific Gravity, Urine: 1.011 (ref 1.005–1.030)
Urobilinogen, UA: 0.2 mg/dL (ref 0.0–1.0)
pH: 6 (ref 5.0–8.0)

## 2013-10-23 LAB — BASIC METABOLIC PANEL
Anion gap: 12 (ref 5–15)
BUN: 18 mg/dL (ref 6–23)
CO2: 30 mEq/L (ref 19–32)
Calcium: 8.7 mg/dL (ref 8.4–10.5)
Chloride: 94 mEq/L — ABNORMAL LOW (ref 96–112)
Creatinine, Ser: 1.28 mg/dL (ref 0.50–1.35)
GFR calc Af Amer: 59 mL/min — ABNORMAL LOW (ref >90)
GFR calc non Af Amer: 51 mL/min — ABNORMAL LOW (ref >90)
Glucose, Bld: 94 mg/dL (ref 70–99)
Potassium: 4.1 mEq/L (ref 3.7–5.3)
SODIUM: 136 meq/L — AB (ref 137–147)

## 2013-10-23 LAB — URINE MICROSCOPIC-ADD ON

## 2013-10-23 MED ORDER — CEFTRIAXONE SODIUM 1 G IJ SOLR
1.0000 g | Freq: Every day | INTRAMUSCULAR | Status: DC
  Administered 2013-10-23 – 2013-10-26 (×4): 1 g via INTRAVENOUS
  Filled 2013-10-23 (×5): qty 10

## 2013-10-23 MED ORDER — FUROSEMIDE 10 MG/ML IJ SOLN
80.0000 mg | Freq: Two times a day (BID) | INTRAMUSCULAR | Status: DC
  Administered 2013-10-24 – 2013-10-26 (×5): 80 mg via INTRAVENOUS
  Filled 2013-10-23 (×7): qty 8

## 2013-10-23 MED ORDER — IPRATROPIUM BROMIDE 0.02 % IN SOLN
500.0000 ug | Freq: Four times a day (QID) | RESPIRATORY_TRACT | Status: DC
  Administered 2013-10-23: 500 ug via RESPIRATORY_TRACT
  Filled 2013-10-23: qty 2.5

## 2013-10-23 MED ORDER — IPRATROPIUM-ALBUTEROL 0.5-2.5 (3) MG/3ML IN SOLN
3.0000 mL | Freq: Four times a day (QID) | RESPIRATORY_TRACT | Status: DC
  Administered 2013-10-23 – 2013-10-25 (×10): 3 mL via RESPIRATORY_TRACT
  Filled 2013-10-23 (×10): qty 3

## 2013-10-23 MED ORDER — BIOTENE DRY MOUTH MT LIQD
15.0000 mL | Freq: Two times a day (BID) | OROMUCOSAL | Status: DC
  Administered 2013-10-23 – 2013-10-27 (×8): 15 mL via OROMUCOSAL

## 2013-10-23 MED ORDER — DM-GUAIFENESIN ER 30-600 MG PO TB12
1.0000 | ORAL_TABLET | Freq: Two times a day (BID) | ORAL | Status: DC
  Administered 2013-10-23 – 2013-10-27 (×8): 1 via ORAL
  Filled 2013-10-23 (×10): qty 1

## 2013-10-23 MED ORDER — AZITHROMYCIN 500 MG PO TABS
500.0000 mg | ORAL_TABLET | Freq: Every day | ORAL | Status: DC
  Administered 2013-10-23 – 2013-10-27 (×5): 500 mg via ORAL
  Filled 2013-10-23 (×5): qty 1

## 2013-10-23 NOTE — Progress Notes (Signed)
Report handed off to oncoming RN. Pt is resting in bed. Wife at bedside. No complaints of pain or discomfort.

## 2013-10-23 NOTE — Progress Notes (Addendum)
[facility] PROGRESS NOTE  Andrew [name] ZOX:[id number] DOB: June 26, 1930 DOA: 10/22/2013 PCP: Lucilla EdinAUB, STEVE A, MD  Assessment/Plan:  Principal Problem:   Acute on chronic systolic CHF (congestive heart failure): last EF 35 %. Continue IV lasix, ARB, beta blocker. Active Problems:   CAP (community acquired pneumonia), possible: CXR shows possible pneumonia. Has been coughing for about a month. Will resume rocephin, azithro, add mucinex DM. Repeat CXR in am to confirm pna. If clears, likely just bronchitis and CHF.   UNSPECIFIED ANEMIA: gets iron infusions. stable   Atrial flutter on xarelto   Hypertension   S/P biventricular cardiac pacemaker-Medtronic   Urinary retention: had in and out cath yesterday. Urinating ok now. On flomax. Check UA r/o uti   Chronic respiratory failure on home oxygen   Other malaise and fatigue: check UA, TSH, b12, folate. Could just be related to CHF Copd: continue duoneb   Code Status:  full Family Communication:  Daughter at bedside Disposition Plan:  home  Consultants:    Procedures:     Antibiotics:  Rocephin 7/21  azithro 7/21  HPI/Subjective: Breathing a little easier. No dysuria. Voiding ok. C/o fatigue. Cough for a month  Objective: Filed Vitals:   10/23/13 1410  BP: 124/46  Pulse: 70  Temp: 97.6 F (36.4 C)  Resp: 18    Intake/Output Summary (Last 24 hours) at 10/23/13 1644 Last data filed at 10/23/13 1409  Gross per 24 hour  Intake    720 ml  Output   2330 ml  Net  -1610 ml   Filed Weights   10/22/13 1757 10/22/13 2126 10/23/13 0429  Weight: 83.915 kg (185 lb) 82.827 kg (182 lb 9.6 oz) 82.328 kg (181 lb 8 oz)    Exam:   General:  Coughing. Breathing nonlabored  Cardiovascular: RRR without MGR  Respiratory: diminished throughout without WRR  Abdomen: S, NT, ND  Ext: trace edeam  Basic Metabolic Panel:  Recent Labs Lab 10/22/13 1845 10/23/13 0519  NA 131* 136*  K 4.4 4.1  CL 92* 94*  CO2 22  30  GLUCOSE 115* 94  BUN 18 18  CREATININE 1.16 1.28  CALCIUM 8.6 8.7   Liver Function Tests: No results found for this basename: AST, ALT, ALKPHOS, BILITOT, PROT, ALBUMIN,  in the last 168 hours No results found for this basename: LIPASE, AMYLASE,  in the last 168 hours No results found for this basename: AMMONIA,  in the last 168 hours CBC:  Recent Labs Lab 10/22/13 1845  WBC 8.0  HGB 10.5*  HCT 32.2*  MCV 95.5  PLT 268   Cardiac Enzymes: No results found for this basename: CKTOTAL, CKMB, CKMBINDEX, TROPONINI,  in the last 168 hours BNP (last 3 results)  Recent Labs  06/25/13 1714 10/10/13 1507 10/22/13 1845  PROBNP 6196.0* 1021.0* 6264.0*   CBG: No results found for this basename: GLUCAP,  in the last 168 hours  No results found for this or any previous visit (from the past 240 hour(s)).   Studies: Dg Chest Port 1 View  10/22/2013   CLINICAL DATA:  Shortness of breath worsening over the past 3 days. History of congestive heart failure and COPD.  EXAM: PORTABLE CHEST - 1 VIEW  COMPARISON:  Chest x-ray 09/21/2013.  FINDINGS: Diffuse patchy interstitial prominence and patchy areas of peribronchial cuffing, most severe in the perihilar aspects of the lungs and in the medial aspect of the right lung base. No confluent consolidative airspace disease on this single view examination. No  pleural effusions. No evidence of pulmonary edema. Heart size is borderline enlarged. Upper mediastinal contours are within normal limits. Left-sided biventricular pacemaker device in position with lead tips projecting over the expected location of the right atrium, right ventricular apex and overlying the lateral wall of the left ventricle via the coronary sinus and coronary veins.  IMPRESSION: 1. The appearance of the chest suggests severe bronchitis, potentially with developing multilobar bronchopneumonia, as detailed above.   Electronically Signed   By: Trudie Reed M.D.   On: 10/22/2013  18:36    Scheduled Meds: . antiseptic oral rinse  15 mL Mouth Rinse BID  . azithromycin  500 mg Oral Daily  . carvedilol  25 mg Oral BID WC  . cefTRIAXone (ROCEPHIN)  IV  1 g Intravenous Once  . cefTRIAXone (ROCEPHIN)  IV  1 g Intravenous Q24H  . dextromethorphan-guaiFENesin  1 tablet Oral BID  . furosemide  40 mg Intravenous Q6H  . hydrALAZINE  12.5 mg Oral QHS  . hydrALAZINE  25 mg Oral BID AC  . HYDROcodone-acetaminophen  1 tablet Oral Q6H  . ipratropium-albuterol  3 mL Nebulization Q6H  . losartan  50 mg Oral BID  . mometasone-formoterol  2 puff Inhalation BID  . pantoprazole  80 mg Oral Q1200  . rivaroxaban  20 mg Oral Q supper  . sodium chloride  3 mL Intravenous Q12H  . tamsulosin  0.4 mg Oral BID   Continuous Infusions:   Time spent: 35 minutes  Sundance Moise L  [facility] Pager [phone]. If 7PM-7AM, please contact night-coverage at [email or web address], password [facility] 10/23/2013, 4:44 PM  LOS: 1 day

## 2013-10-23 NOTE — Progress Notes (Signed)
Co-signed for Irfat Habib RN/BSN for medication administration, assessments, Is&Os, Vital Signs, Progress Notes, Care Plans, and Patient education. Zhi Geier M RN/BSN 

## 2013-10-23 NOTE — Care Management Note (Addendum)
    Page 1 of 2   10/27/2013     10:35:04 AM CARE MANAGEMENT NOTE 10/27/2013  Patient:  Andrew [name],Andrew [name]   Account Number:  [id number]  Date Initiated:  10/23/2013  Documentation initiated by:  [facility]CAMELLIA  Subjective/Objective Assessment:   80 y.o. male with h/o CHF, in ED with worsening SOB and feelings of fluid overload.  Sx have been worse over the past 2-3 days, gradually worsening despite increasing PO lasix to 80mg  BID.// Home with spouse.     Action/Plan:   lasix 40mg  IV q6h, on CHF pathway, recheck BMP in AM.  Tele monitor.  //[facility]   Anticipated DC Date:  10/26/2013   Anticipated DC Plan:  HOME W HOME HEALTH SERVICES      DC Planning Services  CM consult      Choice offered to / List presented to:  C-1 Patient        HH arranged  HH-1 RN  HH-10 DISEASE MANAGEMENT  HH-2 PT      [facility]   Status of service:  Completed, signed off Medicare Important Message given?  YES (If response is "NO", the following Medicare IM given date fields will be blank) Date Medicare IM given:  10/25/2013 Medicare IM given by:  [facility]CAMELLIA Date Additional Medicare IM given:   Additional Medicare IM given by:    Discharge Disposition:  HOME W HOME HEALTH SERVICES  Per UR Regulation:  Reviewed for med. necessity/level of care/duration of stay  If discussed at Long Length of Stay Meetings, dates discussed:    Comments:  10/27/13 10:00 CM notified DME rep to deliver rolling walker to room prior to discharge.  HHRN added for CHF mgmt. [facility] notified of discharge plan.  No other CM needs were comunicated.  Freddy JakschSarah Falon Huesca, BSN, [place] [phone].  10/26/13 1115 Oletta Cohnamellia Wood, RN, BSN, [place] [employer] [phone] Spoke with pt at bedside regarding discharge planning [facility]. Offered pt list of home health agencies to choose from.  [facility]. Ayesha RumpfMary Yonjof, RN of [facility] notified.  No DME needs identified at this  time  10/25/13 Camellia J. Lucretia RoersWood, RN, BSN, [employer] [phone] I have recommended that this patient have Home Health services but he declines at this time. I have discussed the benefits of Home Health with him. The patient verbalizes understanding.

## 2013-10-24 ENCOUNTER — Ambulatory Visit: Payer: Medicare Other

## 2013-10-24 ENCOUNTER — Inpatient Hospital Stay (HOSPITAL_COMMUNITY): Payer: Medicare Other

## 2013-10-24 DIAGNOSIS — R0989 Other specified symptoms and signs involving the circulatory and respiratory systems: Secondary | ICD-10-CM

## 2013-10-24 DIAGNOSIS — R0609 Other forms of dyspnea: Secondary | ICD-10-CM

## 2013-10-24 DIAGNOSIS — D649 Anemia, unspecified: Secondary | ICD-10-CM

## 2013-10-24 DIAGNOSIS — I4891 Unspecified atrial fibrillation: Secondary | ICD-10-CM

## 2013-10-24 LAB — VITAMIN B12: Vitamin B-12: 672 pg/mL (ref 211–911)

## 2013-10-24 LAB — BASIC METABOLIC PANEL
Anion gap: 12 (ref 5–15)
BUN: 18 mg/dL (ref 6–23)
CALCIUM: 8.4 mg/dL (ref 8.4–10.5)
CO2: 29 meq/L (ref 19–32)
Chloride: 92 mEq/L — ABNORMAL LOW (ref 96–112)
Creatinine, Ser: 1.35 mg/dL (ref 0.50–1.35)
GFR, EST AFRICAN AMERICAN: 55 mL/min — AB (ref >90)
GFR, EST NON AFRICAN AMERICAN: 48 mL/min — AB (ref >90)
Glucose, Bld: 98 mg/dL (ref 70–99)
Potassium: 3.5 mEq/L — ABNORMAL LOW (ref 3.7–5.3)
SODIUM: 133 meq/L — AB (ref 137–147)

## 2013-10-24 LAB — TSH: TSH: 2.59 u[IU]/mL (ref 0.350–4.500)

## 2013-10-24 LAB — FOLATE RBC: RBC Folate: 955 ng/mL — ABNORMAL HIGH (ref >280)

## 2013-10-24 MED ORDER — GI COCKTAIL ~~LOC~~
30.0000 mL | Freq: Once | ORAL | Status: DC
  Filled 2013-10-24: qty 30

## 2013-10-24 MED ORDER — HYDROCOD POLST-CHLORPHEN POLST 10-8 MG/5ML PO LQCR
5.0000 mL | Freq: Once | ORAL | Status: AC
  Administered 2013-10-24: 5 mL via ORAL
  Filled 2013-10-24: qty 5

## 2013-10-24 MED ORDER — MENTHOL 3 MG MT LOZG
1.0000 | LOZENGE | OROMUCOSAL | Status: DC | PRN
  Administered 2013-10-24: 3 mg via ORAL
  Filled 2013-10-24: qty 9

## 2013-10-24 MED ORDER — RIVAROXABAN 15 MG PO TABS
15.0000 mg | ORAL_TABLET | Freq: Every day | ORAL | Status: DC
  Administered 2013-10-24 – 2013-10-26 (×3): 15 mg via ORAL
  Filled 2013-10-24 (×4): qty 1

## 2013-10-24 MED ORDER — POTASSIUM CHLORIDE CRYS ER 20 MEQ PO TBCR
40.0000 meq | EXTENDED_RELEASE_TABLET | Freq: Once | ORAL | Status: AC
  Administered 2013-10-24: 40 meq via ORAL
  Filled 2013-10-24: qty 2

## 2013-10-24 NOTE — Progress Notes (Signed)
Patient ID: Andrew [name]  male  ZOX:[id number]    DOB: 1931/04/05    DOA: 10/22/2013  PCP: Lucilla Edin, MD  Assessment/Plan: Principal Problem:   Acute on chronic systolic CHF (congestive heart failure): Per patient, dyspnea improving still has significant pedal edema -  Continue IV diuresis, beta blocker, ARB - Improving -1.94 L on the output  Active Problems: Acute bronchitis - Chest x-ray reviewed, no acute pneumonia, continue Mucinex, IV antibiotics, may not need antibiotics at the time of discharge - Patient improving    UNSPECIFIED ANEMIA - Stable    Atrial flutter - Rate controlled, currently on xarelto    S/P biventricular cardiac pacemaker-Medtronic    Urinary retention - Resolved, UA shows no UTI  Deconditioning - PT, OT. B12 normal limits, TSH 2.5  DVT Prophylaxis: On xarelto  Code Status: Full code  Family Communication: Patient alert and oriented x4, discussed with the patient  Disposition:  Consultants:  None  Procedures:  None  Antibiotics:  IV Rocephin  IV Zithromax    Subjective: Patient seen and examined, feeling somewhat better although still feels weak, shortness of breath is improving still has significant pedal edema  Objective: Weight change: -0.316 kg (-11.1 oz)  Intake/Output Summary (Last 24 hours) at 10/24/13 1105 Last data filed at 10/24/13 8119  Gross per 24 hour  Intake    480 ml  Output   1025 ml  Net   -545 ml   Blood pressure 128/71, pulse 74, temperature 97.8 F (36.6 C), temperature source Oral, resp. rate 20, height 5\' 10"  (1.778 m), weight 83.6 kg (184 lb 4.9 oz), SpO2 98.00%.  Physical Exam: General: Alert and awake, oriented x3, not in any acute distress. CVS: Irregularly irregular Chest: Diminished throughout Abdomen: soft nontender, nondistended, normal bowel sounds  Extremities: no cyanosis, clubbing, 2+ edema noted bilaterally Neuro: Cranial nerves II-XII intact, no focal neurological  deficits  Lab Results: Basic Metabolic Panel:  Recent Labs Lab 10/23/13 0519 10/24/13 0430  NA 136* 133*  K 4.1 3.5*  CL 94* 92*  CO2 30 29  GLUCOSE 94 98  BUN 18 18  CREATININE 1.28 1.35  CALCIUM 8.7 8.4   Liver Function Tests: No results found for this basename: AST, ALT, ALKPHOS, BILITOT, PROT, ALBUMIN,  in the last 168 hours No results found for this basename: LIPASE, AMYLASE,  in the last 168 hours No results found for this basename: AMMONIA,  in the last 168 hours CBC:  Recent Labs Lab 10/22/13 1845  WBC 8.0  HGB 10.5*  HCT 32.2*  MCV 95.5  PLT 268   Cardiac Enzymes: No results found for this basename: CKTOTAL, CKMB, CKMBINDEX, TROPONINI,  in the last 168 hours BNP: No components found with this basename: POCBNP,  CBG: No results found for this basename: GLUCAP,  in the last 168 hours   Micro Results: No results found for this or any previous visit (from the past 240 hour(s)).  Studies/Results: Dg Chest 2 View  10/24/2013   CLINICAL DATA:  COPD with history of CHF ; pneumonia versus CHF now  EXAM: CHEST  2 VIEW  COMPARISON:  Portable chest x-ray of October 22, 2013 and September 21, 2013.  FINDINGS: The lungs remain hyperinflated. When compared to the film of July 20th the interstitial markings are slightly less conspicuous today on the right but fairly stable on the left. They remain increased as compared to the September 21, 2013 study. A small right pleural effusion is present. The  cardiac silhouette remains enlarged. The central pulmonary vascularity is prominent. The permanent pacemaker is unchanged.  IMPRESSION: COPD and parenchymal scarring with superimposed mild CHF.   Electronically Signed   By: David  Swazil[place]   On: 10/24/2013 08:08   Dg Chest Port 1 View  10/22/2013   CLINICAL DATA:  Shortness of breath worsening over the past 3 days. History of congestive heart failure and COPD.  EXAM: PORTABLE CHEST - 1 VIEW  COMPARISON:  Chest x-ray 09/21/2013.  FINDINGS: Diffuse  patchy interstitial prominence and patchy areas of peribronchial cuffing, most severe in the perihilar aspects of the lungs and in the medial aspect of the right lung base. No confluent consolidative airspace disease on this single view examination. No pleural effusions. No evidence of pulmonary edema. Heart size is borderline enlarged. Upper mediastinal contours are within normal limits. Left-sided biventricular pacemaker device in position with lead tips projecting over the expected location of the right atrium, right ventricular apex and overlying the lateral wall of the left ventricle via the coronary sinus and coronary veins.  IMPRESSION: 1. The appearance of the chest suggests severe bronchitis, potentially with developing multilobar bronchopneumonia, as detailed above.   Electronically Signed   By: Trudie Reedaniel  Entrikin M.D.   On: 10/22/2013 18:36    Medications: Scheduled Meds: . antiseptic oral rinse  15 mL Mouth Rinse BID  . azithromycin  500 mg Oral q1800  . carvedilol  25 mg Oral BID WC  . cefTRIAXone (ROCEPHIN)  IV  1 g Intravenous Once  . cefTRIAXone (ROCEPHIN)  IV  1 g Intravenous q1800  . dextromethorphan-guaiFENesin  1 tablet Oral BID  . furosemide  80 mg Intravenous BID  . hydrALAZINE  12.5 mg Oral QHS  . hydrALAZINE  25 mg Oral BID AC  . HYDROcodone-acetaminophen  1 tablet Oral Q6H  . ipratropium-albuterol  3 mL Nebulization Q6H  . losartan  50 mg Oral BID  . mometasone-formoterol  2 puff Inhalation BID  . pantoprazole  80 mg Oral Q1200  . rivaroxaban  15 mg Oral Q supper  . sodium chloride  3 mL Intravenous Q12H  . tamsulosin  0.4 mg Oral BID      LOS: 2 days   Elyshia Kumagai M.D. [facility] 10/24/2013, 11:05 AM Pager: [phone]  If 7PM-7AM, please contact night-coverage [email or web address] Password [email or web address]  **Disclaimer: This note was dictated with voice recognition software. Similar sounding words can inadvertently be transcribed and this note may contain transcription  errors which may not have been corrected upon publication of note.**

## 2013-10-24 NOTE — Progress Notes (Signed)
Patient evaluated for community based chronic disease management services with [facility] as a benefit of patient's [employer]. Spoke with patient at bedside to explain [facility] services.  Patient has declined services at this time.  Left contact information and [facility] literature at bedside. Of note, [facility] services does not replace or interfere with any services that are arranged by inpatient case management or social work.  For additional questions or referrals please contact Anibal Hendersonim Henderson BSN RN [facility] Liaison at [phone].

## 2013-10-24 NOTE — Evaluation (Signed)
Physical Therapy Evaluation Patient Details Name: Andrew [name] MRN: [id number] DOB: 10/15/1930 Today's Date: 10/24/2013   History of Present Illness  Pt is an 80 y/o male admitted with acute on chronic CHF and possible CAP.   Clinical Impression  Pt admitted with the above. Pt currently with functional limitations due to the deficits listed below (see PT Problem List). At the time of PT eval pt was able to perform transfers and ambulation without an AD. Pt was on RA throughout session and maintained O2 sats >90% until the very end of gait training when they fell to 88%. Upon seated rest break they decreased further to 84% and supplemental O2 was again donned. Sats then increased quickly to 95%. Pt will benefit from skilled PT to increase their independence and safety with mobility to allow discharge to the venue listed below.       Follow Up Recommendations Home health PT;Supervision - Intermittent    Equipment Recommendations  Rolling walker with 5" wheels    Recommendations for Other Services       Precautions / Restrictions Precautions Precautions: Fall Restrictions Weight Bearing Restrictions: No      Mobility  Bed Mobility Overal bed mobility: Needs Assistance Bed Mobility: Supine to Sit     Supine to sit: Supervision     General bed mobility comments: Pt was able to transition to EOB with no physical assist. Minimal use of bed rails for support.   Transfers Overall transfer level: Needs assistance Equipment used: None Transfers: Sit to/from Stand Sit to Stand: Min guard         General transfer comment: Pt demonstrated proper hand placement on seated surface for safety. Very minimal unsteadiness noted.   Ambulation/Gait Ambulation/Gait assistance: Min guard Ambulation Distance (Feet): 100 Feet Assistive device: None Gait Pattern/deviations: Step-through pattern;Decreased stride length;Trunk flexed Gait velocity: Decreased Gait velocity interpretation:  Below normal speed for age/gender General Gait Details: Occasional steadying assist only. VC's for general safety awareness and pursed-lip breathing.   Stairs            Wheelchair Mobility    Modified Rankin (Stroke Patients Only)       Balance Overall balance assessment: Needs assistance Sitting-balance support: Feet supported;No upper extremity supported Sitting balance-Leahy Scale: Good     Standing balance support: No upper extremity supported;During functional activity Standing balance-Leahy Scale: Fair                               Pertinent Vitals/Pain Vitals stable throughout session. See above for O2 sats.     Home Living Family/patient expects to be discharged to:: Private residence Living Arrangements: Spouse/significant other Available Help at Discharge: Family;Available PRN/intermittently Type of Home: Mobile home Home Access: Stairs to enter Entrance Stairs-Rails: None Entrance Stairs-Number of Steps: 3 Home Layout: One level Home Equipment: Walker - 4 wheels;Cane - single point;Shower seat      Prior Function Level of Independence: Independent         Comments: No longer driving ~month ago      Hand Dominance   Dominant Hand: Right    Extremity/Trunk Assessment   Upper Extremity Assessment: Defer to OT evaluation           Lower Extremity Assessment: Generalized weakness      Cervical / Trunk Assessment: Kyphotic  Communication   Communication: No difficulties  Cognition Arousal/Alertness: Awake/alert Behavior During Therapy: WFL for tasks assessed/performed Overall Cognitive Status:  Within Functional Limits for tasks assessed                      General Comments      Exercises        Assessment/Plan    PT Assessment Patient needs continued PT services  PT Diagnosis Difficulty walking;Generalized weakness   PT Problem List Decreased strength;Decreased range of motion;Decreased activity  tolerance;Decreased balance;Decreased mobility;Decreased knowledge of use of DME;Decreased safety awareness;Decreased knowledge of precautions  PT Treatment Interventions DME instruction;Stair training;Gait training;Functional mobility training;Therapeutic activities;Therapeutic exercise;Neuromuscular re-education;Patient/family education   PT Goals (Current goals can be found in the Care Plan section) Acute Rehab PT Goals Patient Stated Goal: To return home independently PT Goal Formulation: With patient Time For Goal Achievement: 11/07/13 Potential to Achieve Goals: Good    Frequency Min 3X/week   Barriers to discharge        Co-evaluation               End of Session Equipment Utilized During Treatment: Gait belt;Oxygen Activity Tolerance: Patient limited by fatigue Patient left: in chair;with call bell/phone within reach Nurse Communication: Mobility status         Time: [id number] PT Time Calculation (min): 30 min   Charges:   PT Evaluation $Initial PT Evaluation Tier I: 1 Procedure PT Treatments $Gait Training: 8-22 mins $Therapeutic Activity: 8-22 mins   PT G Codes:          Ruthann CancerHamilton, [place] Morford 10/24/2013, 2:20 PM  Ruthann CancerLaura Hamilton, PT, DPT Acute Rehabilitation Services Pager: [phone]

## 2013-10-25 DIAGNOSIS — J9620 Acute and chronic respiratory failure, unspecified whether with hypoxia or hypercapnia: Secondary | ICD-10-CM

## 2013-10-25 DIAGNOSIS — I10 Essential (primary) hypertension: Secondary | ICD-10-CM

## 2013-10-25 LAB — BASIC METABOLIC PANEL
ANION GAP: 10 (ref 5–15)
BUN: 20 mg/dL (ref 6–23)
CHLORIDE: 93 meq/L — AB (ref 96–112)
CO2: 29 meq/L (ref 19–32)
Calcium: 8.3 mg/dL — ABNORMAL LOW (ref 8.4–10.5)
Creatinine, Ser: 1.35 mg/dL (ref 0.50–1.35)
GFR calc non Af Amer: 48 mL/min — ABNORMAL LOW (ref >90)
GFR, EST AFRICAN AMERICAN: 55 mL/min — AB (ref >90)
Glucose, Bld: 90 mg/dL (ref 70–99)
POTASSIUM: 3.9 meq/L (ref 3.7–5.3)
SODIUM: 132 meq/L — AB (ref 137–147)

## 2013-10-25 LAB — URINALYSIS, ROUTINE W REFLEX MICROSCOPIC
Bilirubin Urine: NEGATIVE
Glucose, UA: NEGATIVE mg/dL
Hgb urine dipstick: NEGATIVE
Ketones, ur: NEGATIVE mg/dL
LEUKOCYTES UA: NEGATIVE
NITRITE: NEGATIVE
PH: 6 (ref 5.0–8.0)
Protein, ur: 30 mg/dL — AB
SPECIFIC GRAVITY, URINE: 1.012 (ref 1.005–1.030)
UROBILINOGEN UA: 0.2 mg/dL (ref 0.0–1.0)

## 2013-10-25 LAB — URINE MICROSCOPIC-ADD ON

## 2013-10-25 MED ORDER — ALBUTEROL SULFATE (2.5 MG/3ML) 0.083% IN NEBU: 2.5000 mg | INHALATION_SOLUTION | RESPIRATORY_TRACT | Status: DC | PRN

## 2013-10-25 MED ORDER — IPRATROPIUM-ALBUTEROL 0.5-2.5 (3) MG/3ML IN SOLN
3.0000 mL | Freq: Three times a day (TID) | RESPIRATORY_TRACT | Status: DC
  Administered 2013-10-26 – 2013-10-27 (×4): 3 mL via RESPIRATORY_TRACT
  Filled 2013-10-25 (×4): qty 3

## 2013-10-25 NOTE — Progress Notes (Addendum)
Patient ID: Andrew [name]  male  ZOX:[id number]    DOB: May 16, 1930    DOA: 10/22/2013  PCP: Lucilla Edin, MD  Assessment/Plan:   Acute on chronic systolic CHF (congestive heart failure): Per patient, dyspnea improving still has significant pedal edema -  Continue IV diuresis, beta blocker, ARB   Acute bronchitis - Chest x-ray reviewed, no acute pneumonia, continue Mucinex, IV antibiotics, may not need antibiotics at the time of discharge - Patient improving  Acute on chronic resp failure -on 2-2.5L of O2 at home    UNSPECIFIED ANEMIA - Stable    Atrial flutter - Rate controlled, currently on xarelto    S/P biventricular cardiac pacemaker-[employer]    Urinary retention - Resolved, UA shows no UTI  Deconditioning - PT, OT. B12 normal limits, TSH 2.5  DVT Prophylaxis: On xarelto  Code Status: Full code  Family Communication: Patient alert and oriented x4, discussed with the patient  Disposition: home 1-2 days  Consultants:  None  Procedures:  None  Antibiotics:  IV Rocephin  IV Zithromax   Subjective: Still c/o SOB  Objective: Weight change: 0.316 kg (11.1 oz)  Intake/Output Summary (Last 24 hours) at 10/25/13 0819 Last data filed at 10/25/13 0405  Gross per 24 hour  Intake    960 ml  Output   1675 ml  Net   -715 ml   Blood pressure 118/66, pulse 76, temperature 97.6 F (36.4 C), temperature source Oral, resp. rate 18, height 5\' 10"  (1.778 m), weight 83.915 kg (185 lb), SpO2 100.00%.  Physical Exam: General: Alert and awake, oriented x3, not in any acute distress. CVS: Irregularly irregular Chest: coarse breath sounds Abdomen: soft nontender, nondistended, normal bowel sounds  Extremities: no cyanosis, clubbing, 2+ edema noted bilaterally Neuro: Cranial nerves II-XII intact, no focal neurological deficits  Lab Results: Basic Metabolic Panel:  Recent Labs Lab 10/24/13 0430 10/25/13 0355  NA 133* 132*  K 3.5* 3.9  CL 92* 93*  CO2  29 29  GLUCOSE 98 90  BUN 18 20  CREATININE 1.35 1.35  CALCIUM 8.4 8.3*   Liver Function Tests: No results found for this basename: AST, ALT, ALKPHOS, BILITOT, PROT, ALBUMIN,  in the last 168 hours No results found for this basename: LIPASE, AMYLASE,  in the last 168 hours No results found for this basename: AMMONIA,  in the last 168 hours CBC:  Recent Labs Lab 10/22/13 1845  WBC 8.0  HGB 10.5*  HCT 32.2*  MCV 95.5  PLT 268   Cardiac Enzymes: No results found for this basename: CKTOTAL, CKMB, CKMBINDEX, TROPONINI,  in the last 168 hours BNP: No components found with this basename: POCBNP,  CBG: No results found for this basename: GLUCAP,  in the last 168 hours   Micro Results: No results found for this or any previous visit (from the past 240 hour(s)).  Studies/Results: Dg Chest 2 View  10/24/2013   CLINICAL DATA:  COPD with history of CHF ; pneumonia versus CHF now  EXAM: CHEST  2 VIEW  COMPARISON:  Portable chest x-ray of October 22, 2013 and September 21, 2013.  FINDINGS: The lungs remain hyperinflated. When compared to the film of July 20th the interstitial markings are slightly less conspicuous today on the right but fairly stable on the left. They remain increased as compared to the September 21, 2013 study. A small right pleural effusion is present. The cardiac silhouette remains enlarged. The central pulmonary vascularity is prominent. The permanent pacemaker is unchanged.  IMPRESSION: COPD and parenchymal scarring with superimposed mild CHF.   Electronically Signed   By: David  [place]   On: 10/24/2013 08:08   Dg Chest Port 1 View  10/22/2013   CLINICAL DATA:  Shortness of breath worsening over the past 3 days. History of congestive heart failure and COPD.  EXAM: PORTABLE CHEST - 1 VIEW  COMPARISON:  Chest x-ray 09/21/2013.  FINDINGS: Diffuse patchy interstitial prominence and patchy areas of peribronchial cuffing, most severe in the perihilar aspects of the lungs and in the medial  aspect of the right lung base. No confluent consolidative airspace disease on this single view examination. No pleural effusions. No evidence of pulmonary edema. Heart size is borderline enlarged. Upper mediastinal contours are within normal limits. Left-sided biventricular pacemaker device in position with lead tips projecting over the expected location of the right atrium, right ventricular apex and overlying the lateral wall of the left ventricle via the coronary sinus and coronary veins.  IMPRESSION: 1. The appearance of the chest suggests severe bronchitis, potentially with developing multilobar bronchopneumonia, as detailed above.   Electronically Signed   By: Trudie Reedaniel  Entrikin M.D.   On: 10/22/2013 18:36    Medications: Scheduled Meds: . antiseptic oral rinse  15 mL Mouth Rinse BID  . azithromycin  500 mg Oral q1800  . carvedilol  25 mg Oral BID WC  . cefTRIAXone (ROCEPHIN)  IV  1 g Intravenous Once  . cefTRIAXone (ROCEPHIN)  IV  1 g Intravenous q1800  . dextromethorphan-guaiFENesin  1 tablet Oral BID  . furosemide  80 mg Intravenous BID  . gi cocktail  30 mL Oral Once  . hydrALAZINE  12.5 mg Oral QHS  . hydrALAZINE  25 mg Oral BID AC  . HYDROcodone-acetaminophen  1 tablet Oral Q6H  . ipratropium-albuterol  3 mL Nebulization Q6H  . losartan  50 mg Oral BID  . mometasone-formoterol  2 puff Inhalation BID  . pantoprazole  80 mg Oral Q1200  . rivaroxaban  15 mg Oral Q supper  . sodium chloride  3 mL Intravenous Q12H  . tamsulosin  0.4 mg Oral BID      LOS: 3 days   Alton Bouknight DO. [facility] 10/25/2013, 8:19 AM Pager: [phone]  If 7PM-7AM, please contact night-coverage [email or web address] Password [email or web address]  **Disclaimer: This note was dictated with voice recognition software. Similar sounding words can inadvertently be transcribed and this note may contain transcription errors which may not have been corrected upon publication of note.**

## 2013-10-26 DIAGNOSIS — J441 Chronic obstructive pulmonary disease with (acute) exacerbation: Secondary | ICD-10-CM

## 2013-10-26 MED ORDER — FUROSEMIDE 80 MG PO TABS
80.0000 mg | ORAL_TABLET | Freq: Two times a day (BID) | ORAL | Status: DC
  Administered 2013-10-26 – 2013-10-27 (×3): 80 mg via ORAL
  Filled 2013-10-26 (×5): qty 1

## 2013-10-26 MED ORDER — HYDRALAZINE HCL 10 MG PO TABS
10.0000 mg | ORAL_TABLET | Freq: Two times a day (BID) | ORAL | Status: DC
  Filled 2013-10-26 (×2): qty 1

## 2013-10-26 MED ORDER — POTASSIUM CHLORIDE CRYS ER 20 MEQ PO TBCR
40.0000 meq | EXTENDED_RELEASE_TABLET | Freq: Once | ORAL | Status: AC
  Administered 2013-10-26: 40 meq via ORAL
  Filled 2013-10-26: qty 2

## 2013-10-26 MED ORDER — FUROSEMIDE 80 MG PO TABS
80.0000 mg | ORAL_TABLET | Freq: Two times a day (BID) | ORAL | Status: DC | PRN
Start: 2013-10-26 — End: 2013-10-26

## 2013-10-26 MED ORDER — LOSARTAN POTASSIUM 25 MG PO TABS
25.0000 mg | ORAL_TABLET | Freq: Two times a day (BID) | ORAL | Status: DC
  Administered 2013-10-26 – 2013-10-27 (×2): 25 mg via ORAL
  Filled 2013-10-26 (×4): qty 1

## 2013-10-26 NOTE — Progress Notes (Signed)
Report given to receiving RN. Patient in bed resting. No signs or symptoms of distress or discomfort noted.  

## 2013-10-26 NOTE — Progress Notes (Signed)
Co-signed for Irfat Habib RN/BSN for medication administration, assessments, Is&Os, Vital Signs, Progress Notes, Care Plans, and Patient education. Elan Mcelvain M RN/BSN 

## 2013-10-26 NOTE — Progress Notes (Signed)
Physical Therapy Treatment Patient Details Name: Andrew [name] MRN: [id number] DOB: 09/19/1930 Today's Date: 10/26/2013    History of Present Illness Pt is an 80 y/o male admitted with acute on chronic CHF and possible CAP.     PT Comments    Treatment session limited by nausea. PT assisted to reposition pt in bed for more neutral spinal and pelvic positioning while resting. Encouraged pt to ambulate with staff later this evening if he feels better. Noted possible d/c tomorrow, will try to see pt prior to d/c, if scheduling permits.  Follow Up Recommendations  Home health PT;Supervision - Intermittent     Equipment Recommendations  Rolling walker with 5" wheels    Recommendations for Other Services       Precautions / Restrictions Precautions Precautions: Fall Restrictions Weight Bearing Restrictions: No    Mobility  Bed Mobility Overal bed mobility: Needs Assistance Bed Mobility: Rolling Rolling: Supervision         General bed mobility comments: Pt was able to roll and reposition in bed. PT positioned pillows between pt's knees and under head for more neutral spine and pelvic positioning while sidelying.  Transfers                    Ambulation/Gait                 Stairs            Wheelchair Mobility    Modified Rankin (Stroke Patients Only)       Balance                                    Cognition Arousal/Alertness: Awake/alert Behavior During Therapy: WFL for tasks assessed/performed Overall Cognitive Status: Within Functional Limits for tasks assessed                      Exercises      General Comments General comments (skin integrity, edema, etc.): Discussed general safety awareness and encouraged pt to ambulate with staff later this evening if he feels better.       Pertinent Vitals/Pain Vitals stable throughout session.     Home Living                      Prior Function             PT Goals (current goals can now be found in the care plan section) Acute Rehab PT Goals Patient Stated Goal: To return home independently PT Goal Formulation: With patient Time For Goal Achievement: 11/07/13 Potential to Achieve Goals: Good Progress towards PT goals: Progressing toward goals    Frequency  Min 3X/week    PT Plan Current plan remains appropriate    Co-evaluation             End of Session Equipment Utilized During Treatment: Oxygen Activity Tolerance: Other (comment) (Limited by nausea) Patient left: in bed;with call bell/phone within reach     Time: 1501-1509 PT Time Calculation (min): 8 min  Charges:  $Therapeutic Activity: 8-22 mins                    G Codes:      Andrew [name], Andrew [name] 10/26/2013, 4:30 PM  Andrew CancerLaura Hamilton, PT, DPT Acute Rehabilitation Services Pager: [phone]

## 2013-10-26 NOTE — Progress Notes (Signed)
Patient ID: Andrew [name]  male  ZOX:[id number]    DOB: May 20, 1930    DOA: 10/22/2013  PCP: Lucilla Edin, MD  Assessment/Plan:   Acute on chronic systolic CHF (congestive heart failure): Per patient, dyspnea improving still has significant pedal edema -  PO lasix BID, beta blocker, ARB  Acute bronchitis - Chest x-ray reviewed, no acute pneumonia, continue Mucinex, antibiotics, may not need antibiotics at the time of discharge - Patient improving  Acute on chronic resp failure -on 2-2.5L of O2 at home    UNSPECIFIED ANEMIA - Stable    Atrial flutter - Rate controlled, currently on xarelto    S/P biventricular cardiac pacemaker-[employer]    Urinary retention - Resolved, UA shows no UTI  Deconditioning - PT, OT. B12 normal limits, TSH 2.5  DVT Prophylaxis: On xarelto  Code Status: Full code  Family Communication: Patient alert and oriented x4, discussed with the patient and wife  Disposition: home in AM- has declined home health- asked care manager to call wife  Consultants:  None  Procedures:  None  Antibiotics:  IV Rocephin  IV Zithromax   Subjective: Breathing improved   Objective: Weight change: 0.59 kg (1 lb 4.8 oz)  Intake/Output Summary (Last 24 hours) at 10/26/13 1001 Last data filed at 10/26/13 0212  Gross per 24 hour  Intake    480 ml  Output   2025 ml  Net  -1545 ml   Blood pressure 92/37, pulse 68, temperature 97.7 F (36.5 C), temperature source Oral, resp. rate 18, height 5\' 10"  (1.778 m), weight 84.505 kg (186 lb 4.8 oz), SpO2 97.00%.  Physical Exam: General: Alert and awake, oriented x3, not in any acute distress. CVS: Irregularly irregular Chest: no wheezing, moving more air Abdomen: soft nontender, nondistended, normal bowel sounds  Extremities: no cyanosis, clubbing, 2+ edema noted bilaterally Neuro: Cranial nerves II-XII intact, no focal neurological deficits  Lab Results: Basic Metabolic Panel:  Recent Labs Lab  10/24/13 0430 10/25/13 0355  NA 133* 132*  K 3.5* 3.9  CL 92* 93*  CO2 29 29  GLUCOSE 98 90  BUN 18 20  CREATININE 1.35 1.35  CALCIUM 8.4 8.3*   Liver Function Tests: No results found for this basename: AST, ALT, ALKPHOS, BILITOT, PROT, ALBUMIN,  in the last 168 hours No results found for this basename: LIPASE, AMYLASE,  in the last 168 hours No results found for this basename: AMMONIA,  in the last 168 hours CBC:  Recent Labs Lab 10/22/13 1845  WBC 8.0  HGB 10.5*  HCT 32.2*  MCV 95.5  PLT 268   Cardiac Enzymes: No results found for this basename: CKTOTAL, CKMB, CKMBINDEX, TROPONINI,  in the last 168 hours BNP: No components found with this basename: POCBNP,  CBG: No results found for this basename: GLUCAP,  in the last 168 hours   Micro Results: No results found for this or any previous visit (from the past 240 hour(s)).  Studies/Results: Dg Chest 2 View  10/24/2013   CLINICAL DATA:  COPD with history of CHF ; pneumonia versus CHF now  EXAM: CHEST  2 VIEW  COMPARISON:  Portable chest x-ray of October 22, 2013 and September 21, 2013.  FINDINGS: The lungs remain hyperinflated. When compared to the film of July 20th the interstitial markings are slightly less conspicuous today on the right but fairly stable on the left. They remain increased as compared to the September 21, 2013 study. A small right pleural effusion is present. The  cardiac silhouette remains enlarged. The central pulmonary vascularity is prominent. The permanent pacemaker is unchanged.  IMPRESSION: COPD and parenchymal scarring with superimposed mild CHF.   Electronically Signed   By: David  [place]   On: 10/24/2013 08:08   Dg Chest Port 1 View  10/22/2013   CLINICAL DATA:  Shortness of breath worsening over the past 3 days. History of congestive heart failure and COPD.  EXAM: PORTABLE CHEST - 1 VIEW  COMPARISON:  Chest x-ray 09/21/2013.  FINDINGS: Diffuse patchy interstitial prominence and patchy areas of peribronchial  cuffing, most severe in the perihilar aspects of the lungs and in the medial aspect of the right lung base. No confluent consolidative airspace disease on this single view examination. No pleural effusions. No evidence of pulmonary edema. Heart size is borderline enlarged. Upper mediastinal contours are within normal limits. Left-sided biventricular pacemaker device in position with lead tips projecting over the expected location of the right atrium, right ventricular apex and overlying the lateral wall of the left ventricle via the coronary sinus and coronary veins.  IMPRESSION: 1. The appearance of the chest suggests severe bronchitis, potentially with developing multilobar bronchopneumonia, as detailed above.   Electronically Signed   By: Trudie Reedaniel  Entrikin M.D.   On: 10/22/2013 18:36    Medications: Scheduled Meds: . antiseptic oral rinse  15 mL Mouth Rinse BID  . azithromycin  500 mg Oral q1800  . carvedilol  25 mg Oral BID WC  . cefTRIAXone (ROCEPHIN)  IV  1 g Intravenous Once  . cefTRIAXone (ROCEPHIN)  IV  1 g Intravenous q1800  . dextromethorphan-guaiFENesin  1 tablet Oral BID  . furosemide  80 mg Oral BID  . gi cocktail  30 mL Oral Once  . hydrALAZINE  10 mg Oral BID AC  . hydrALAZINE  12.5 mg Oral QHS  . HYDROcodone-acetaminophen  1 tablet Oral Q6H  . ipratropium-albuterol  3 mL Nebulization TID  . losartan  25 mg Oral BID  . mometasone-formoterol  2 puff Inhalation BID  . pantoprazole  80 mg Oral Q1200  . potassium chloride  40 mEq Oral Once  . rivaroxaban  15 mg Oral Q supper  . sodium chloride  3 mL Intravenous Q12H  . tamsulosin  0.4 mg Oral BID      LOS: 4 days   Jareth Pardee DO. [facility] 10/26/2013, 10:01 AM Pager: [phone]  If 7PM-7AM, please contact night-coverage [email or web address] Password [email or web address]  **Disclaimer: This note was dictated with voice recognition software. Similar sounding words can inadvertently be transcribed and this note may contain  transcription errors which may not have been corrected upon publication of note.**

## 2013-10-27 MED ORDER — DM-GUAIFENESIN ER 30-600 MG PO TB12: 1.0000 | ORAL_TABLET | Freq: Two times a day (BID) | ORAL | Status: DC

## 2013-10-27 MED ORDER — LEVOFLOXACIN 500 MG PO TABS: 500.0000 mg | ORAL_TABLET | Freq: Every day | ORAL | Status: DC

## 2013-10-27 MED ORDER — FUROSEMIDE 80 MG PO TABS: 80.0000 mg | ORAL_TABLET | Freq: Two times a day (BID) | ORAL | Status: DC

## 2013-10-27 NOTE — Discharge Summary (Signed)
Physician Discharge Summary  Andrew [name] UJW:[id number] DOB: 1930/10/30 DOA: 10/22/2013  PCP: Lucilla Edin, MD  Admit date: 10/22/2013 Discharge date: 10/27/2013  Time spent: 35 minutes  Recommendations for Outpatient Follow-up:  1. Home health 2. O2 3. Monitor BMP  Discharge Diagnoses:  Principal Problem:   Acute on chronic systolic CHF (congestive heart failure) Active Problems:   UNSPECIFIED ANEMIA   Atrial flutter   Hypertension   Ischemic cardiomyopathy   S/P biventricular cardiac pacemaker-Medtronic   Urinary retention   CAP (community acquired pneumonia), possible   Chronic respiratory failure   Other malaise and fatigue   Discharge Condition: improved  Diet recommendation: cardiac  Filed Weights   10/25/13 0504 10/26/13 0503 10/27/13 0355  Weight: 83.915 kg (185 lb) 84.505 kg (186 lb 4.8 oz) 83.598 kg (184 lb 4.8 oz)    History of present illness:  Andrew [name] is a 80 y.o. male with h/o CHF, presents to ED with worsening SOB and feelings of fluid overload. Symptoms have been worse over the past 2-3 days, gradually worsening despite increasing PO lasix to 80mg  BID. Symptoms similar to previous CHF exacerbations. No fevers, no chills, non-productive cough, does have orthopnea. Leg swelling is bilateral. Taking meds as directed he states.   Hospital Course:  Acute on chronic systolic CHF (congestive heart failure): Per patient, dyspnea improving still has significant pedal edema  - PO lasix BID, beta blocker, ARB  Acute bronchitis  - Chest x-ray reviewed, no acute pneumonia, continue Mucinex - Patient improving   Acute on chronic resp failure  -on 2-2.5L of O2 at home   UNSPECIFIED ANEMIA  - Stable   Atrial flutter  - Rate controlled, currently on xarelto  S/P biventricular cardiac pacemaker-Medtronic   Urinary retention  - Resolved, UA shows no UTI   Deconditioning  - PT, OT. B12 normal limits, TSH  2.5  Procedures:  none  Consultations:  none  Discharge Exam: Filed Vitals:   10/27/13 0355  BP: 152/71  Pulse: 76  Temp: 97.4 F (36.3 C)  Resp: 18    General: A+Ox3, NAd Cardiovascular: rrr Respiratory: clear  Discharge Instructions You were cared for by a hospitalist during your hospital stay. If you have any questions about your discharge medications or the care you received while you were in the hospital after you are discharged, you can call the unit and asked to speak with the hospitalist on call if the hospitalist that took care of you is not available. Once you are discharged, your primary care physician will handle any further medical issues. Please note that NO REFILLS for any discharge medications will be authorized once you are discharged, as it is imperative that you return to your primary care physician (or establish a relationship with a primary care physician if you do not have one) for your aftercare needs so that they can reassess your need for medications and monitor your lab values.      Discharge Instructions   (HEART FAILURE PATIENTS) Call MD:  Anytime you have any of the following symptoms: 1) 3 pound weight gain in 24 hours or 5 pounds in 1 week 2) shortness of breath, with or without a dry hacking cough 3) swelling in the hands, feet or stomach 4) if you have to sleep on extra pillows at night in order to breathe.    Complete by:  As directed      Diet - low sodium heart healthy    Complete by:  As  directed      Discharge instructions    Complete by:  As directed   Bmp 1 week     Increase activity slowly    Complete by:  As directed             Medication List    STOP taking these medications       hydrALAZINE 25 MG tablet  Commonly known as:  APRESOLINE      TAKE these medications       albuterol 108 (90 BASE) MCG/ACT inhaler  Commonly known as:  PROVENTIL HFA;VENTOLIN HFA  Inhale 1 puff into the lungs every 6 (six) hours as needed for  wheezing or shortness of breath.     albuterol (5 MG/ML) 0.5% nebulizer solution  Commonly known as:  PROVENTIL  Take 2.5 mg by nebulization every 6 (six) hours as needed for wheezing or shortness of breath.     ALPRAZolam 0.25 MG tablet  Commonly known as:  XANAX  Take 0.25 mg by mouth daily as needed. For anxiety.     carvedilol 25 MG tablet  Commonly known as:  COREG  Take 25 mg by mouth 2 (two) times daily with a meal.     dextromethorphan-guaiFENesin 30-600 MG per 12 hr tablet  Commonly known as:  MUCINEX DM  Take 1 tablet by mouth 2 (two) times daily.     esomeprazole 40 MG capsule  Commonly known as:  NEXIUM  Take 40 mg by mouth 2 (two) times daily before a meal.     fluticasone 50 MCG/ACT nasal spray  Commonly known as:  FLONASE  Place 2 sprays into both nostrils as needed for allergies.     Fluticasone-Salmeterol 250-50 MCG/DOSE Aepb  Commonly known as:  ADVAIR  Inhale 1 puff into the lungs 2 (two) times daily.     furosemide 80 MG tablet  Commonly known as:  LASIX  Take 1 tablet (80 mg total) by mouth 2 (two) times daily.     HYDROcodone-acetaminophen 5-325 MG per tablet  Commonly known as:  NORCO/VICODIN  Take 1 tablet by mouth every 6 (six) hours. Arthritis pain     ipratropium 0.02 % nebulizer solution  Commonly known as:  ATROVENT  Take 2.5 mLs (500 mcg total) by nebulization 4 (four) times daily.     levofloxacin 500 MG tablet  Commonly known as:  LEVAQUIN  Take 1 tablet (500 mg total) by mouth daily.     losartan 50 MG tablet  Commonly known as:  COZAAR  Take 50 mg by mouth 2 (two) times daily.     meclizine 25 MG tablet  Commonly known as:  ANTIVERT  Take 12.5-25 mg by mouth 3 (three) times daily as needed for dizziness.     nitroGLYCERIN 0.4 MG SL tablet  Commonly known as:  NITROSTAT  Place 0.4 mg under the tongue every 5 (five) minutes as needed for chest pain.     NON FORMULARY  Place 2 L into the nose daily.     potassium chloride 10  MEQ tablet  Commonly known as:  K-DUR,KLOR-CON  Take 10 mEq by mouth every other day.     rivaroxaban 20 MG Tabs tablet  Commonly known as:  XARELTO  Take 1 tablet (20 mg total) by mouth daily with supper.     tamsulosin 0.4 MG Caps capsule  Commonly known as:  FLOMAX  Take 0.4 mg by mouth 2 (two) times daily.       Allergies  Allergen Reactions  . Codeine Nausea And Vomiting  . Inspra [Eplerenone] Other (See Comments)    Stomach problems  . Spironolactone Other (See Comments)    Gynecomastia  . Doxycycline Itching and Rash  . Sulfonamide Derivatives Itching and Rash   Follow-up Information   Follow up with [facility]. (RN/PT)    Contact information:   [address] [place] [address] [phone]       Follow up with [facility]. (rolling walker)    Contact information:   [address] [place] [address] [phone]        The results of significant diagnostics from this hospitalization (including imaging, microbiology, ancillary and laboratory) are listed below for reference.    Significant Diagnostic Studies: Dg Chest 2 View  10/24/2013   CLINICAL DATA:  COPD with history of CHF ; pneumonia versus CHF now  EXAM: CHEST  2 VIEW  COMPARISON:  Portable chest x-ray of October 22, 2013 and September 21, 2013.  FINDINGS: The lungs remain hyperinflated. When compared to the film of July 20th the interstitial markings are slightly less conspicuous today on the right but fairly stable on the left. They remain increased as compared to the September 21, 2013 study. A small right pleural effusion is present. The cardiac silhouette remains enlarged. The central pulmonary vascularity is prominent. The permanent pacemaker is unchanged.  IMPRESSION: COPD and parenchymal scarring with superimposed mild CHF.   Electronically Signed   By: David  [place]   On: 10/24/2013 08:08   Dg Chest Port 1 View  10/22/2013   CLINICAL DATA:  Shortness of breath  worsening over the past 3 days. History of congestive heart failure and COPD.  EXAM: PORTABLE CHEST - 1 VIEW  COMPARISON:  Chest x-ray 09/21/2013.  FINDINGS: Diffuse patchy interstitial prominence and patchy areas of peribronchial cuffing, most severe in the perihilar aspects of the lungs and in the medial aspect of the right lung base. No confluent consolidative airspace disease on this single view examination. No pleural effusions. No evidence of pulmonary edema. Heart size is borderline enlarged. Upper mediastinal contours are within normal limits. Left-sided biventricular pacemaker device in position with lead tips projecting over the expected location of the right atrium, right ventricular apex and overlying the lateral wall of the left ventricle via the coronary sinus and coronary veins.  IMPRESSION: 1. The appearance of the chest suggests severe bronchitis, potentially with developing multilobar bronchopneumonia, as detailed above.   Electronically Signed   By: Trudie Reed M.D.   On: 10/22/2013 18:36    Microbiology: No results found for this or any previous visit (from the past 240 hour(s)).   Labs: Basic Metabolic Panel:  Recent Labs Lab 10/22/13 1845 10/23/13 0519 10/24/13 0430 10/25/13 0355  NA 131* 136* 133* 132*  K 4.4 4.1 3.5* 3.9  CL 92* 94* 92* 93*  CO2 22 30 29 29   GLUCOSE 115* 94 98 90  BUN 18 18 18 20   CREATININE 1.16 1.28 1.35 1.35  CALCIUM 8.6 8.7 8.4 8.3*   Liver Function Tests: No results found for this basename: AST, ALT, ALKPHOS, BILITOT, PROT, ALBUMIN,  in the last 168 hours No results found for this basename: LIPASE, AMYLASE,  in the last 168 hours No results found for this basename: AMMONIA,  in the last 168 hours CBC:  Recent Labs Lab 10/22/13 1845  WBC 8.0  HGB 10.5*  HCT 32.2*  MCV 95.5  PLT 268  Cardiac Enzymes: No results found for this basename: CKTOTAL, CKMB, CKMBINDEX, TROPONINI,  in the last 168 hours BNP: BNP (last 3  results)  Recent Labs  06/25/13 1714 10/10/13 1507 10/22/13 1845  PROBNP 6196.0* 1021.0* 6264.0*   CBG: No results found for this basename: GLUCAP,  in the last 168 hours     Signed:  Lynzy Rawles  [facility] 10/27/2013, 1:30 PM

## 2013-10-27 NOTE — Progress Notes (Signed)
Physical Therapy Treatment Patient Details Name: Andrew [name] MRN: [id number] DOB: 1930/04/12 Today's Date: 10/27/2013    History of Present Illness Pt is an 80 y/o male admitted with acute on chronic CHF and possible CAP.     PT Comments    Pt continues to be limited due to decrease in SaO2 maintaining in 80s during ambulation.  Pt needs intermittent standing rest breaks during ambulation to complete pursed-lip breathing.  Will continue to follow pt.   Follow Up Recommendations  Home health PT;Supervision - Intermittent     Equipment Recommendations  Rolling walker with 5" wheels    Recommendations for Other Services       Precautions / Restrictions Precautions Precautions: Fall Restrictions Weight Bearing Restrictions: No    Mobility  Bed Mobility                  Transfers Overall transfer level: Needs assistance Equipment used: None Transfers: Sit to/from Stand Sit to Stand: Min guard         General transfer comment: Pt demonstrated proper hand placement on seated surface for safety. Very minimal unsteadiness noted.   Ambulation/Gait Ambulation/Gait assistance: Min guard Ambulation Distance (Feet): 120 Feet Assistive device: None Gait Pattern/deviations: Step-through pattern;Decreased stride length Gait velocity: Decreased Gait velocity interpretation: Below normal speed for age/gender General Gait Details: Pt ambulates with guarded gait and very slow cadence.  Pt needs intermittent standing rest breaks to perform pursed-lip breathing.  Pts O2 decreased to 85% on 3L oxygen and increased to 4L during ambulation.    Stairs Stairs:  (Did not preform stairs due to decrease in overall oxygen. )          Wheelchair Mobility    Modified Rankin (Stroke Patients Only)       Balance Overall balance assessment: Needs assistance Sitting-balance support: Feet supported Sitting balance-Leahy Scale: Good     Standing balance support: No upper  extremity supported Standing balance-Leahy Scale: Fair                      Cognition Arousal/Alertness: Awake/alert Behavior During Therapy: WFL for tasks assessed/performed Overall Cognitive Status: Within Functional Limits for tasks assessed                      Exercises General Exercises - Lower Extremity Ankle Circles/Pumps: Strengthening;Both;10 reps Gluteal Sets: Strengthening;10 reps Long Arc Quad: Strengthening;Both;10 reps Hip Flexion/Marching: Both;10 reps;Seated;Strengthening    General Comments        Pertinent Vitals/Pain SaO2 90% on 3L and decrease to 85% on 4L with ambulation; no reports of pain.     Home Living                      Prior Function            PT Goals (current goals can now be found in the care plan section) Acute Rehab PT Goals Patient Stated Goal: To return home independently and return to gardening.  PT Goal Formulation: With patient Time For Goal Achievement: 11/07/13 Potential to Achieve Goals: Good Progress towards PT goals: Progressing toward goals (slowly due to oxygen maintains in 80s with ambulation)    Frequency  Min 3X/week    PT Plan Current plan remains appropriate    Co-evaluation             End of Session Equipment Utilized During Treatment: Oxygen Activity Tolerance: Other (comment) (Limited by nausea) Patient left:  in bed;with call bell/phone within reach (Sitting EOB)     Time: 0902-0929 PT Time Calculation (min): 27 min  Charges:  $Gait Training: 8-22 mins $Therapeutic Exercise: 8-22 mins                    G Codes:      Blong Busk 10/27/2013, 9:39 AM Jake SharkWendy Soyla Bainter, PT DPT [phone]

## 2013-11-01 ENCOUNTER — Telehealth: Payer: Self-pay

## 2013-11-01 ENCOUNTER — Telehealth: Payer: Self-pay | Admitting: Internal Medicine

## 2013-11-01 NOTE — Telephone Encounter (Signed)
Oretha EllisJoanne, [facility] needs 1) order  For PT & Treatment 2 times a week for six weeks;  Then for once a week for two weeks.   2) order for pulsor to take the patients oxygen levels (he is on oxygen)  [phone]  Randa EvensJoanne

## 2013-11-01 NOTE — Telephone Encounter (Signed)
Explained that I would review this with Dr. Graciela HusbandsKlein when he returns to office tomorrow.

## 2013-11-01 NOTE — Telephone Encounter (Signed)
New message      Pt was recently in hosp for CHF. Hospitalist did not sign order and PCP would not sign order.  Need a doctor to give a verbal order for PT 2x a wk for 6wksk then once a week for 2wks; start of care date is 10-31-13...the patient is having trouble with balance and endurance.  Pt cannot be seen until they get the verbal order---next scheduled appt is noon on Friday 31st.

## 2013-11-01 NOTE — Telephone Encounter (Signed)
Spoke to [place]anne, instructed her patient has been seeing Rollene RotundaHochrein, James MD at [facility] number given.  She stated she would call for orders.

## 2013-11-02 ENCOUNTER — Telehealth: Payer: Self-pay

## 2013-11-02 NOTE — Telephone Encounter (Signed)
Okey Regalarol, RN w/[facility], called to report that she saw pt today and he has a non-productive cough and wheezing bilaterally in lower lobes. No fever, no weight gain (stable at 180 lbs), PO2 is 98% on 2.5 L of O2. Pt states that he has "appt" with doctor on Sunday. I advised Okey RegalCarol that we do not have appts on Sunday, but may be planning to walk-in. Advised Dr Cleta Albertsaub is not here this weekend, but another provider will be happy to see pt, and suggested that he try to come in tomorrow instead since he has this new cough. Dr Cleta Albertsaub, do you want pt to come in sooner, or do you have any other instr's for pt or RN?

## 2013-11-03 ENCOUNTER — Ambulatory Visit (INDEPENDENT_AMBULATORY_CARE_PROVIDER_SITE_OTHER): Payer: Medicare Other

## 2013-11-03 ENCOUNTER — Ambulatory Visit (INDEPENDENT_AMBULATORY_CARE_PROVIDER_SITE_OTHER): Payer: Medicare Other | Admitting: Family Medicine

## 2013-11-03 VITALS — BP 112/70 | HR 46 | Temp 98.1°F | Resp 16 | Ht 69.5 in | Wt 178.0 lb

## 2013-11-03 DIAGNOSIS — R059 Cough, unspecified: Secondary | ICD-10-CM

## 2013-11-03 DIAGNOSIS — J449 Chronic obstructive pulmonary disease, unspecified: Secondary | ICD-10-CM

## 2013-11-03 DIAGNOSIS — Z09 Encounter for follow-up examination after completed treatment for conditions other than malignant neoplasm: Secondary | ICD-10-CM

## 2013-11-03 DIAGNOSIS — R05 Cough: Secondary | ICD-10-CM

## 2013-11-03 DIAGNOSIS — J4489 Other specified chronic obstructive pulmonary disease: Secondary | ICD-10-CM

## 2013-11-03 DIAGNOSIS — Z8679 Personal history of other diseases of the circulatory system: Secondary | ICD-10-CM

## 2013-11-03 MED ORDER — BENZONATATE 100 MG PO CAPS: 100.0000 mg | ORAL_CAPSULE | Freq: Two times a day (BID) | ORAL | Status: DC | PRN

## 2013-11-03 NOTE — Progress Notes (Signed)
Chief Complaint:  Chief Complaint  Patient presents with  . Hospitalization Follow-up    COPD, CHF    HPI: Andrew [name] is a 80 y.o. male who is here for  Follow-up for hospital discharge for COPD /CHF exacerbation and also possible  CAP on abx for 7 days.  The wheezing is not better. His cough is +/- better. The fatigue is better, he can walk to bathroom now and around the house. No fevers or chills.  Last of levaquin was on Sunday, he got a 5  day course of that but got abax in the hospital for a total of 7. He was given rocephin in the ER.  Heis here for recheck, still has hacking cough. Although he says he is still weak his wife states he is overall better.  No leg edema, now on Lasix 80 mg BID so need to check renal function He has 90% pulse ox with exertion, 94-96% on 2.5 L cont [place] at rest No weight gain   SpO2 Readings from Last 3 Encounters:  11/03/13 95%  10/27/13 92%  10/10/13 96%   Wt Readings from Last 3 Encounters:  11/03/13 178 lb (80.74 kg)  10/27/13 184 lb 4.8 oz (83.598 kg)  10/12/13 187 lb (84.823 kg)      Past Medical History  Diagnosis Date  . Chronic systolic heart failure     a. Chronic LV systolic failure. b. Echo in Dec 2011 showed EF of 25% - f/u echo 02/2011: Mild LVH, EF 15%, posterior lateral akinesis, inferior akinesis, grade 1 diastolic dysfunction, mild LAE. c. s/p BiV pacemaker 06/2011.  [name] Atrial flutter     a. Off amiodarone because of increased LFTs  . COPD (chronic obstructive pulmonary disease)     Chronic SOB, O2 dependent  . Myocardial infarction 1994  . Gastro - esophageal reflux   . Claudication   . BPH (benign prostatic hyperplasia)   . Coronary artery disease     a. Myocardial infarction in 1984,  99 and 07. b. H/o multiple RCA stents. c.  LHC 04/2011: without obstructive disease. oLM 25%, mLAD 30%, pCFX 25%, prox to mid RCA stent ok, dRCA 30% and 70%, pPDA 25%, mPL 25-30%, EF 25%.  [name] Hypertension   . Atrial fibrillation      a. On Xarelto.  . Hemorrhoids   . Hyperlipidemia     statin intolerant 2/2 myalgias  . Hx of adenomatous colonic polyps   . Hx of colonoscopy   . Chronic renal insufficiency   . Biventricular cardiac pacemaker in situ 3.2013  . NSVT (nonsustained ventricular tachycardia)     a. 04/2012- 6 beats on tele.  [name] Shortness of breath   . Arthritis     hips ,back ,hands  . Iron deficiency anemia, unspecified 03/20/2013   Past Surgical History  Procedure Laterality Date  . Cardiac catheterization  2008  . Cataract extraction    . Salivary gland surgery    . Coronary angioplasty with stent placement    . Insert / replace / remove pacemaker  06/16/2011  . Tee without cardioversion  05/08/2012    Procedure: TRANSESOPHAGEAL ECHOCARDIOGRAM (TEE);  Surgeon: Vesta Mixer, MD;  Location: [facility] ENDOSCOPY;  Service: Cardiovascular;  Laterality: N/A;   History   Social History  . Marital Status: Married    Spouse Name: N/A    Number of Children: N/A  . Years of Education: N/A   Occupational History  . retired Designer, industrial/product  work    Social History Main Topics  . Smoking status: Former Smoker -- 1.50 packs/day for 55 years    Types: Cigarettes    Start date: 05/21/1954    Quit date: 01/03/2009  . Smokeless tobacco: Never Used     Comment: quit 4 years ago  . Alcohol Use: No     Comment: occasional  . Drug Use: No  . Sexual Activity: Not Currently   Other Topics Concern  . None   Social History Narrative  . None   Family History  Problem Relation Age of Onset  . Coronary artery disease Father   . Heart disease Mother   . Colon cancer Neg Hx   . Heart disease Brother    Allergies  Allergen Reactions  . Codeine Nausea And Vomiting  . Inspra [Eplerenone] Other (See Comments)    Stomach problems  . Spironolactone Other (See Comments)    Gynecomastia  . Doxycycline Itching and Rash  . Sulfonamide Derivatives Itching and Rash   Prior to Admission medications   Medication  Sig Start Date End Date Taking? Authorizing Provider  albuterol (PROVENTIL HFA;VENTOLIN HFA) 108 (90 BASE) MCG/ACT inhaler Inhale 1 puff into the lungs every 6 (six) hours as needed for wheezing or shortness of breath.   Yes Historical Provider, MD  albuterol (PROVENTIL) (5 MG/ML) 0.5% nebulizer solution Take 2.5 mg by nebulization every 6 (six) hours as needed for wheezing or shortness of breath.   Yes Historical Provider, MD  ALPRAZolam (XANAX) 0.25 MG tablet Take 0.25 mg by mouth daily as needed. For anxiety. 06/23/10  Yes Roger ShelterStanley Tennant, MD  carvedilol (COREG) 25 MG tablet Take 25 mg by mouth 2 (two) times daily with a meal.   Yes Historical Provider, MD  esomeprazole (NEXIUM) 40 MG capsule Take 40 mg by mouth 2 (two) times daily before a meal.   Yes Historical Provider, MD  fluticasone (FLONASE) 50 MCG/ACT nasal spray Place 2 sprays into both nostrils as needed for allergies. 07/04/13 07/10/14 Yes Collene GobbleSteven A Daub, MD  Fluticasone-Salmeterol (ADVAIR) 250-50 MCG/DOSE AEPB Inhale 1 puff into the lungs 2 (two) times daily.   Yes Historical Provider, MD  furosemide (LASIX) 80 MG tablet Take 1 tablet (80 mg total) by mouth 2 (two) times daily. 10/27/13  Yes Joseph ArtJessica U Vann, DO  HYDROcodone-acetaminophen (NORCO/VICODIN) 5-325 MG per tablet Take 1 tablet by mouth every 6 (six) hours. Arthritis pain   Yes Historical Provider, MD  ipratropium (ATROVENT) 0.02 % nebulizer solution Take 2.5 mLs (500 mcg total) by nebulization 4 (four) times daily. 10/02/12  Yes Eleanore E Egan, PA-C  losartan (COZAAR) 50 MG tablet Take 50 mg by mouth 2 (two) times daily.   Yes Historical Provider, MD  meclizine (ANTIVERT) 25 MG tablet Take 12.5-25 mg by mouth 3 (three) times daily as needed for dizziness.   Yes Historical Provider, MD  nitroGLYCERIN (NITROSTAT) 0.4 MG SL tablet Place 0.4 mg under the tongue every 5 (five) minutes as needed for chest pain.   Yes Historical Provider, MD  NON FORMULARY Place 2 L into the nose daily.   Yes  Historical Provider, MD  potassium chloride (K-DUR,KLOR-CON) 10 MEQ tablet Take 10 mEq by mouth every other day.   Yes Historical Provider, MD  rivaroxaban (XARELTO) 20 MG TABS tablet Take 1 tablet (20 mg total) by mouth daily with supper. 08/10/13  Yes Rosalio MacadamiaLori C Gerhardt, NP  Tamsulosin HCl (FLOMAX) 0.4 MG CAPS Take 0.4 mg by mouth 2 (two) times daily.  Yes Historical Provider, MD     ROS: The patient denies fevers, chills, night sweats, unintentional weight loss, chest pain, palpitations, nausea, vomiting, abdominal pain, dysuria, hematuria, melena, numbness,  or tingling. + wheezing, dyspnea on exertion, dry cough  All other systems have been reviewed and were otherwise negative with the exception of those mentioned in the HPI and as above.    PHYSICAL EXAM: Filed Vitals:   11/03/13 1425  BP: 112/70  Pulse: 46  Temp: 98.1 F (36.7 C)  Resp: 16   Filed Vitals:   11/03/13 1425  Height: 5' 9.5" (1.765 m)  Weight: 178 lb (80.74 kg)   Body mass index is 25.92 kg/(m^2).  General: Alert, no acute distress, thin male  HEENT:  Normocephalic, atraumatic, oropharynx patent. EOMI, PERRLA, TM normal, + PND  Cardiovascular:  Regular rate and rhythm,  Radial pulse intact. No pedal edema.  Respiratory: Clear to auscultation bilaterally.  No wheezes, rales, +rhonchi.  No cyanosis, no use of accessory musculature GI: No organomegaly, abdomen is soft and non-tender, positive bowel sounds.  No masses. Skin: No rashes. Neurologic: Facial musculature symmetric. Psychiatric: Patient is appropriate throughout our interaction. Lymphatic: No cervical lymphadenopathy Musculoskeletal: Gait intact, needs assist    LABS: Results for orders placed during the hospital encounter of 10/22/13  CBC      Result Value Ref Range   WBC 8.0  4.0 - 10.5 K/uL   RBC 3.37 (*) 4.22 - 5.81 MIL/uL   Hemoglobin 10.5 (*) 13.0 - 17.0 g/dL   HCT 40.9 (*) 81.1 - 91.4 %   MCV 95.5  78.0 - 100.0 fL   MCH 31.2  26.0 - 34.0  pg   MCHC 32.6  30.0 - 36.0 g/dL   RDW 78.2  95.6 - 21.3 %   Platelets 268  150 - 400 K/uL  BASIC METABOLIC PANEL      Result Value Ref Range   Sodium 131 (*) 137 - 147 mEq/L   Potassium 4.4  3.7 - 5.3 mEq/L   Chloride 92 (*) 96 - 112 mEq/L   CO2 22  19 - 32 mEq/L   Glucose, Bld 115 (*) 70 - 99 mg/dL   BUN 18  6 - 23 mg/dL   Creatinine, Ser 0.86  0.50 - 1.35 mg/dL   Calcium 8.6  8.4 - 57.8 mg/dL   GFR calc non Af Amer 57 (*) >90 mL/min   GFR calc Af Amer 66 (*) >90 mL/min   Anion gap 17 (*) 5 - 15  PRO B NATRIURETIC PEPTIDE      Result Value Ref Range   Pro B Natriuretic peptide (BNP) 6264.0 (*) 0 - 450 pg/mL  BASIC METABOLIC PANEL      Result Value Ref Range   Sodium 136 (*) 137 - 147 mEq/L   Potassium 4.1  3.7 - 5.3 mEq/L   Chloride 94 (*) 96 - 112 mEq/L   CO2 30  19 - 32 mEq/L   Glucose, Bld 94  70 - 99 mg/dL   BUN 18  6 - 23 mg/dL   Creatinine, Ser 4.69  0.50 - 1.35 mg/dL   Calcium 8.7  8.4 - 62.9 mg/dL   GFR calc non Af Amer 51 (*) >90 mL/min   GFR calc Af Amer 59 (*) >90 mL/min   Anion gap 12  5 - 15  URINALYSIS, ROUTINE W REFLEX MICROSCOPIC      Result Value Ref Range   Color, Urine YELLOW  YELLOW  APPearance CLEAR  CLEAR   Specific Gravity, Urine 1.011  1.005 - 1.030   pH 6.0  5.0 - 8.0   Glucose, UA NEGATIVE  NEGATIVE mg/dL   Hgb urine dipstick NEGATIVE  NEGATIVE   Bilirubin Urine NEGATIVE  NEGATIVE   Ketones, ur NEGATIVE  NEGATIVE mg/dL   Protein, ur 161 (*) NEGATIVE mg/dL   Urobilinogen, UA 0.2  0.0 - 1.0 mg/dL   Nitrite NEGATIVE  NEGATIVE   Leukocytes, UA NEGATIVE  NEGATIVE  BASIC METABOLIC PANEL      Result Value Ref Range   Sodium 133 (*) 137 - 147 mEq/L   Potassium 3.5 (*) 3.7 - 5.3 mEq/L   Chloride 92 (*) 96 - 112 mEq/L   CO2 29  19 - 32 mEq/L   Glucose, Bld 98  70 - 99 mg/dL   BUN 18  6 - 23 mg/dL   Creatinine, Ser 0.96  0.50 - 1.35 mg/dL   Calcium 8.4  8.4 - 04.5 mg/dL   GFR calc non Af Amer 48 (*) >90 mL/min   GFR calc Af Amer 55 (*) >90  mL/min   Anion gap 12  5 - 15  TSH      Result Value Ref Range   TSH 2.590  0.350 - 4.500 uIU/mL  VITAMIN B12      Result Value Ref Range   Vitamin B-12 672  211 - 911 pg/mL  FOLATE RBC      Result Value Ref Range   RBC Folate 955 (*) >280 ng/mL  URINE MICROSCOPIC-ADD ON      Result Value Ref Range   Squamous Epithelial / LPF RARE  RARE   WBC, UA 0-2  <3 WBC/hpf   RBC / HPF 0-2  <3 RBC/hpf   Bacteria, UA RARE  RARE   Casts HYALINE CASTS (*) NEGATIVE  BASIC METABOLIC PANEL      Result Value Ref Range   Sodium 132 (*) 137 - 147 mEq/L   Potassium 3.9  3.7 - 5.3 mEq/L   Chloride 93 (*) 96 - 112 mEq/L   CO2 29  19 - 32 mEq/L   Glucose, Bld 90  70 - 99 mg/dL   BUN 20  6 - 23 mg/dL   Creatinine, Ser 4.09  0.50 - 1.35 mg/dL   Calcium 8.3 (*) 8.4 - 10.5 mg/dL   GFR calc non Af Amer 48 (*) >90 mL/min   GFR calc Af Amer 55 (*) >90 mL/min   Anion gap 10  5 - 15  URINALYSIS, ROUTINE W REFLEX MICROSCOPIC      Result Value Ref Range   Color, Urine YELLOW  YELLOW   APPearance CLEAR  CLEAR   Specific Gravity, Urine 1.012  1.005 - 1.030   pH 6.0  5.0 - 8.0   Glucose, UA NEGATIVE  NEGATIVE mg/dL   Hgb urine dipstick NEGATIVE  NEGATIVE   Bilirubin Urine NEGATIVE  NEGATIVE   Ketones, ur NEGATIVE  NEGATIVE mg/dL   Protein, ur 30 (*) NEGATIVE mg/dL   Urobilinogen, UA 0.2  0.0 - 1.0 mg/dL   Nitrite NEGATIVE  NEGATIVE   Leukocytes, UA NEGATIVE  NEGATIVE  URINE MICROSCOPIC-ADD ON      Result Value Ref Range   Squamous Epithelial / LPF RARE  RARE   WBC, UA 0-2  <3 WBC/hpf   RBC / HPF 0-2  <3 RBC/hpf   Bacteria, UA RARE  RARE   Casts HYALINE CASTS (*) NEGATIVE  I-STAT TROPOININ, ED  Result Value Ref Range   Troponin i, poc 0.03  0.00 - 0.08 ng/mL   Comment 3              EKG/XRAY:   Primary read interpreted by Dr. Conley Rolls at [facility]. No acute cardiopulmonary process, pacemaker is in place   ASSESSMENT/PLAN: Encounter Diagnoses  Name Primary?  . Cough   . History of CHF (congestive  heart failure) Yes  . Advanced COPD   . Hospital discharge follow-up    Mr Senat is here for hospital discharge follow-up for CHF exacerbation, possible CAP on 7 days of antibiotics ; he also has a h/o COPD on  Continuous 2.5 L [place], afib/flutter on Xarelto, has a dual chamber pacer, last Echo EF was 30-35% in 10/08/2013 . He also needs forms for home health to be signes since the [facility] agency requires it to be signed by his PCP.  Rx tessalon perles for cough His chest xray look better than when he got discharge Due to rhoncerous breath sounds will get BNP He is on mega doses of lasix 80 mg BID, will get repeat BMP, may have to taper depending on creatinine and his sxs, he is doing well on 80 mg BID. Will call cardiology with results and changes for input Will fill out forms for [facility] for him, need to call them and ask them to fax and attn it to me  Gross sideeffects, risk and benefits, and alternatives of medications d/w patient. Patient is aware that all medications have potential sideeffects and we are unable to predict every sideeffect or drug-drug interaction that may occur.  Hamilton Capri PHUONG, DO 11/03/2013 3:33 PM

## 2013-11-03 NOTE — Telephone Encounter (Signed)
He needs to plan to come in this weekend and get checked

## 2013-11-03 NOTE — Patient Instructions (Signed)
Heart Failure °Heart failure is a condition in which the heart has trouble pumping blood. This means your heart does not pump blood efficiently for your body to work well. In some cases of heart failure, fluid may back up into your lungs or you may have swelling (edema) in your lower legs. Heart failure is usually a long-term (chronic) condition. It is important for you to take good care of yourself and follow your health care provider's treatment plan. °CAUSES  °Some health conditions can cause heart failure. Those health conditions include: °· High blood pressure (hypertension). Hypertension causes the heart muscle to work harder than normal. When pressure in the blood vessels is high, the heart needs to pump (contract) with more force in order to circulate blood throughout the body. High blood pressure eventually causes the heart to become stiff and weak. °· Coronary artery disease (CAD). CAD is the buildup of cholesterol and fat (plaque) in the arteries of the heart. The blockage in the arteries deprives the heart muscle of oxygen and blood. This can cause chest pain and may lead to a heart attack. High blood pressure can also contribute to CAD. °· Heart attack (myocardial infarction). A heart attack occurs when one or more arteries in the heart become blocked. The loss of oxygen damages the muscle tissue of the heart. When this happens, part of the heart muscle dies. The injured tissue does not contract as well and weakens the heart's ability to pump blood. °· Abnormal heart valves. When the heart valves do not open and close properly, it can cause heart failure. This makes the heart muscle pump harder to keep the blood flowing. °· Heart muscle disease (cardiomyopathy or myocarditis). Heart muscle disease is damage to the heart muscle from a variety of causes. These can include drug or alcohol abuse, infections, or unknown reasons. These can increase the risk of heart failure. °· Lung disease. Lung disease  makes the heart work harder because the lungs do not work properly. This can cause a strain on the heart, leading it to fail. °· Diabetes. Diabetes increases the risk of heart failure. High blood sugar contributes to high fat (lipid) levels in the blood. Diabetes can also cause slow damage to tiny blood vessels that carry important nutrients to the heart muscle. When the heart does not get enough oxygen and food, it can cause the heart to become weak and stiff. This leads to a heart that does not contract efficiently. °· Other conditions can contribute to heart failure. These include abnormal heart rhythms, thyroid problems, and low blood counts (anemia). °Certain unhealthy behaviors can increase the risk of heart failure, including: °· Being overweight. °· Smoking or chewing tobacco. °· Eating foods high in fat and cholesterol. °· Abusing illicit drugs or alcohol. °· Lacking physical activity. °SYMPTOMS  °Heart failure symptoms may vary and can be hard to detect. Symptoms may include: °· Shortness of breath with activity, such as climbing stairs. °· Persistent cough. °· Swelling of the feet, ankles, legs, or abdomen. °· Unexplained weight gain. °· Difficulty breathing when lying flat (orthopnea). °· Waking from sleep because of the need to sit up and get more air. °· Rapid heartbeat. °· Fatigue and loss of energy. °· Feeling light-headed, dizzy, or close to fainting. °· Loss of appetite. °· Nausea. °· Increased urination during the night (nocturia). °DIAGNOSIS  °A diagnosis of heart failure is based on your history, symptoms, physical examination, and diagnostic tests. Diagnostic tests for heart failure may include: °·   Echocardiography. °· Electrocardiography. °· Chest X-ray. °· Blood tests. °· Exercise stress test. °· Cardiac angiography. °· Radionuclide scans. °TREATMENT  °Treatment is aimed at managing the symptoms of heart failure. Medicines, behavioral changes, or surgical intervention may be necessary to  treat heart failure. °· Medicines to help treat heart failure may include: °¨ Angiotensin-converting enzyme (ACE) inhibitors. This type of medicine blocks the effects of a blood protein called angiotensin-converting enzyme. ACE inhibitors relax (dilate) the blood vessels and help lower blood pressure. °¨ Angiotensin receptor blockers (ARBs). This type of medicine blocks the actions of a blood protein called angiotensin. Angiotensin receptor blockers dilate the blood vessels and help lower blood pressure. °¨ Water pills (diuretics). Diuretics cause the kidneys to remove salt and water from the blood. The extra fluid is removed through urination. This loss of extra fluid lowers the volume of blood the heart pumps. °¨ Beta blockers. These prevent the heart from beating too fast and improve heart muscle strength. °¨ Digitalis. This increases the force of the heartbeat. °· Healthy behavior changes include: °¨ Obtaining and maintaining a healthy weight. °¨ Stopping smoking or chewing tobacco. °¨ Eating heart-healthy foods. °¨ Limiting or avoiding alcohol. °¨ Stopping illicit drug use. °¨ Physical activity as directed by your health care provider. °· Surgical treatment for heart failure may include: °¨ A procedure to open blocked arteries, repair damaged heart valves, or remove damaged heart muscle tissue. °¨ A pacemaker to improve heart muscle function and control certain abnormal heart rhythms. °¨ An internal cardioverter defibrillator to treat certain serious abnormal heart rhythms. °¨ A left ventricular assist device (LVAD) to assist the pumping ability of the heart. °HOME CARE INSTRUCTIONS  °· Take medicines only as directed by your health care provider. Medicines are important in reducing the workload of your heart, slowing the progression of heart failure, and improving your symptoms. °¨ Do not stop taking your medicine unless directed by your health care provider. °¨ Do not skip any dose of medicine. °¨ Refill your  prescriptions before you run out of medicine. Your medicines are needed every day. °· Engage in moderate physical activity if directed by your health care provider. Moderate physical activity can benefit some people. The elderly and people with severe heart failure should consult with a health care provider for physical activity recommendations. °· Eat heart-healthy foods. Food choices should be free of trans fat and low in saturated fat, cholesterol, and salt (sodium). Healthy choices include fresh or frozen fruits and vegetables, fish, lean meats, legumes, fat-free or low-fat dairy products, and whole grain or high fiber foods. Talk to a dietitian to learn more about heart-healthy foods. °· Limit sodium if directed by your health care provider. Sodium restriction may reduce symptoms of heart failure in some people. Talk to a dietitian to learn more about heart-healthy seasonings. °· Use healthy cooking methods. Healthy cooking methods include roasting, grilling, broiling, baking, poaching, steaming, or stir-frying. Talk to a dietitian to learn more about healthy cooking methods. °· Limit fluids if directed by your health care provider. Fluid restriction may reduce symptoms of heart failure in some people. °· Weigh yourself every day. Daily weights are important in the early recognition of excess fluid. You should weigh yourself every morning after you urinate and before you eat breakfast. Wear the same amount of clothing each time you weigh yourself. Record your daily weight. Provide your health care provider with your weight record. °· Monitor and record your blood pressure if directed by your health care   provider. °· Check your pulse if directed by your health care provider. °· Lose weight if directed by your health care provider. Weight loss may reduce symptoms of heart failure in some people. °· Stop smoking or chewing tobacco. Nicotine makes your heart work harder by causing your blood vessels to constrict.  Do not use nicotine gum or patches before talking to your health care provider. °· Keep all follow-up visits as directed by your health care provider. This is important. °· Limit alcohol intake to no more than 1 drink per day for nonpregnant women and 2 drinks per day for men. One drink equals 12 ounces of beer, 5 ounces of wine, or 1½ ounces of hard liquor. Drinking more than that is harmful to your heart. Tell your health care provider if you drink alcohol several times a week. Talk with your health care provider about whether alcohol is safe for you. If your heart has already been damaged by alcohol or you have severe heart failure, drinking alcohol should be stopped completely. °· Stop illicit drug use. °· Stay up-to-date with immunizations. It is especially important to prevent respiratory infections through current pneumococcal and influenza immunizations. °· Manage other health conditions such as hypertension, diabetes, thyroid disease, or abnormal heart rhythms as directed by your health care provider. °· Learn to manage stress. °· Plan rest periods when fatigued. °· Learn strategies to manage high temperatures. If the weather is extremely hot: °¨ Avoid vigorous physical activity. °¨ Use air conditioning or fans or seek a cooler location. °¨ Avoid caffeine and alcohol. °¨ Wear loose-fitting, lightweight, and light-colored clothing. °· Learn strategies to manage cold temperatures. If the weather is extremely cold: °¨ Avoid vigorous physical activity. °¨ Layer clothes. °¨ Wear mittens or gloves, a hat, and a scarf when going outside. °¨ Avoid alcohol. °· Obtain ongoing education and support as needed. °· Participate in or seek rehabilitation as needed to maintain or improve independence and quality of life. °SEEK MEDICAL CARE IF:  °· Your weight increases by 03 lb/1.4 kg in 1 day or 05 lb/2.3 kg in a week. °· You have increasing shortness of breath that is unusual for you. °· You are unable to participate in  your usual physical activities. °· You tire easily. °· You cough more than normal, especially with physical activity. °· You have any or more swelling in areas such as your hands, feet, ankles, or abdomen. °· You are unable to sleep because it is hard to breathe. °· You feel like your heart is beating fast (palpitations). °· You become dizzy or light-headed upon standing up. °SEEK IMMEDIATE MEDICAL CARE IF:  °· You have difficulty breathing. °· There is a change in mental status such as decreased alertness or difficulty with concentration. °· You have a pain or discomfort in your chest. °· You have an episode of fainting (syncope). °MAKE SURE YOU:  °· Understand these instructions. °· Will watch your condition. °· Will get help right away if you are not doing well or get worse. °Document Released: 03/22/2005 Document Revised: 08/06/2013 Document Reviewed: 04/21/2012 °[employer]® Patient Information ©2015 [employer]. This information is not intended to replace advice given to you by your health care provider. Make sure you discuss any questions you have with your health care provider. °Chronic Obstructive Pulmonary Disease °Chronic obstructive pulmonary disease (COPD) is a common lung condition in which airflow from the lungs is limited. COPD is a general term that can be used to describe many different lung problems   that limit airflow, including both chronic bronchitis and emphysema.  If you have COPD, your lung function will probably never return to normal, but there are measures you can take to improve lung function and make yourself feel better.  °CAUSES  °· Smoking (common).   °· Exposure to secondhand smoke.   °· Genetic problems. °· Chronic inflammatory lung diseases or recurrent infections. °SYMPTOMS  °· Shortness of breath, especially with physical activity.   °· Deep, persistent (chronic) cough with a large amount of thick mucus.   °· Wheezing.   °· Rapid breaths (tachypnea).   °· Gray or bluish  discoloration (cyanosis) of the skin, especially in fingers, toes, or lips.   °· Fatigue.   °· Weight loss.   °· Frequent infections or episodes when breathing symptoms become much worse (exacerbations).   °· Chest tightness. °DIAGNOSIS  °Your health care provider will take a medical history and perform a physical examination to make the initial diagnosis.  Additional tests for COPD may include:  °· Lung (pulmonary) function tests. °· Chest X-ray. °· CT scan. °· Blood tests. °TREATMENT  °Treatment available to help you feel better when you have COPD includes:  °· Inhaler and nebulizer medicines. These help manage the symptoms of COPD and make your breathing more comfortable. °· Supplemental oxygen. Supplemental oxygen is only helpful if you have a low oxygen level in your blood.   °· Exercise and physical activity. These are beneficial for nearly all people with COPD. Some people may also benefit from a pulmonary rehabilitation program. °HOME CARE INSTRUCTIONS  °· Take all medicines (inhaled or pills) as directed by your health care provider. °· Avoid over-the-counter medicines or cough syrups that dry up your airway (such as antihistamines) and slow down the elimination of secretions unless instructed otherwise by your health care provider.   °· If you are a smoker, the most important thing that you can do is stop smoking. Continuing to smoke will cause further lung damage and breathing trouble. Ask your health care provider for help with quitting smoking. He or she can direct you to community resources or hospitals that provide support. °· Avoid exposure to irritants such as smoke, chemicals, and fumes that aggravate your breathing. °· Use oxygen therapy and pulmonary rehabilitation if directed by your health care provider. If you require home oxygen therapy, ask your health care provider whether you should purchase a pulse oximeter to measure your oxygen level at home.   °· Avoid contact with individuals who have  a contagious illness. °· Avoid extreme temperature and humidity changes. °· Eat healthy foods. Eating smaller, more frequent meals and resting before meals may help you maintain your strength. °· Stay active, but balance activity with periods of rest. Exercise and physical activity will help you maintain your ability to do things you want to do. °· Preventing infection and hospitalization is very important when you have COPD. Make sure to receive all the vaccines your health care provider recommends, especially the pneumococcal and influenza vaccines. Ask your health care provider whether you need a pneumonia vaccine. °· Learn and use relaxation techniques to manage stress. °· Learn and use controlled breathing techniques as directed by your health care provider. Controlled breathing techniques include:   °¨ Pursed lip breathing. Start by breathing in (inhaling) through your nose for 1 second. Then, purse your lips as if you were going to whistle and breathe out (exhale) through the pursed lips for 2 seconds.   °¨ Diaphragmatic breathing. Start by putting one hand on your abdomen just above your waist. Inhale slowly through your nose. The   hand on your abdomen should move out. Then purse your lips and exhale slowly. You should be able to feel the hand on your abdomen moving in as you exhale.   °· Learn and use controlled coughing to clear mucus from your lungs. Controlled coughing is a series of short, progressive coughs. The steps of controlled coughing are:   °1. Lean your head slightly forward.   °2. Breathe in deeply using diaphragmatic breathing.   °3. Try to hold your breath for 3 seconds.   °4. Keep your mouth slightly open while coughing twice.   °5. Spit any mucus out into a tissue.   °6. Rest and repeat the steps once or twice as needed. °SEEK MEDICAL CARE IF:  °· You are coughing up more mucus than usual.   °· There is a change in the color or thickness of your mucus.   °· Your breathing is more labored  than usual.   °· Your breathing is faster than usual.   °SEEK IMMEDIATE MEDICAL CARE IF:  °· You have shortness of breath while you are resting.   °· You have shortness of breath that prevents you from: °¨ Being able to talk.   °¨ Performing your usual physical activities.   °· You have chest pain lasting longer than 5 minutes.   °· Your skin color is more cyanotic than usual. °· You measure low oxygen saturations for longer than 5 minutes with a pulse oximeter. °MAKE SURE YOU:  °· Understand these instructions. °· Will watch your condition. °· Will get help right away if you are not doing well or get worse. °Document Released: 12/30/2004 Document Revised: 08/06/2013 Document Reviewed: 11/16/2012 °[employer]® Patient Information ©2015 [employer]. This information is not intended to replace advice given to you by your health care provider. Make sure you discuss any questions you have with your health care provider. ° °

## 2013-11-03 NOTE — Telephone Encounter (Signed)
Advised pt to come on in.  Pt plans on coming on Sunday.

## 2013-11-04 LAB — BASIC METABOLIC PANEL
CO2: 31 mEq/L (ref 19–32)
Chloride: 93 mEq/L — ABNORMAL LOW (ref 96–112)
Creat: 1.42 mg/dL — ABNORMAL HIGH (ref 0.50–1.35)

## 2013-11-04 LAB — BASIC METABOLIC PANEL WITH GFR
BUN: 21 mg/dL (ref 6–23)
Calcium: 9.4 mg/dL (ref 8.4–10.5)
Glucose, Bld: 105 mg/dL — ABNORMAL HIGH (ref 70–99)
Potassium: 5.1 meq/L (ref 3.5–5.3)
Sodium: 133 meq/L — ABNORMAL LOW (ref 135–145)

## 2013-11-05 ENCOUNTER — Other Ambulatory Visit: Payer: Self-pay | Admitting: Family Medicine

## 2013-11-05 ENCOUNTER — Telehealth: Payer: Self-pay

## 2013-11-05 DIAGNOSIS — I5032 Chronic diastolic (congestive) heart failure: Secondary | ICD-10-CM

## 2013-11-05 LAB — BRAIN NATRIURETIC PEPTIDE: Brain Natriuretic Peptide: 803.3 pg/mL — ABNORMAL HIGH (ref 0.0–100.0)

## 2013-11-05 NOTE — Telephone Encounter (Signed)
Joyce GrossKay called in regards to pt, said she would like to know if Dr.Le would sign a plan of care, please [phone] ext: [phone]

## 2013-11-05 NOTE — Telephone Encounter (Signed)
Spoke with  Patient and wife about labs, he just recently got dc for CHF exacerbation, PE slighty still rhoncerous and BNP> 800 , creatinine has bumped up minimally. HE is benefiting from increased doses of LAsix since CHF sxs are better  but will need to see if kidney fxn still increasing. Will put in order for BMP for today or tomorrow and will call cardiology once get results prn.

## 2013-11-05 NOTE — Telephone Encounter (Signed)
Lowella Fairydvised Kay to fax orders over for review.

## 2013-11-06 ENCOUNTER — Other Ambulatory Visit: Payer: Medicare Other | Admitting: Radiology

## 2013-11-06 DIAGNOSIS — I5032 Chronic diastolic (congestive) heart failure: Secondary | ICD-10-CM

## 2013-11-06 LAB — BASIC METABOLIC PANEL WITH GFR
BUN: 25 mg/dL — ABNORMAL HIGH (ref 6–23)
CO2: 32 meq/L (ref 19–32)
Calcium: 8.6 mg/dL (ref 8.4–10.5)
Chloride: 91 meq/L — ABNORMAL LOW (ref 96–112)
Creat: 1.48 mg/dL — ABNORMAL HIGH (ref 0.50–1.35)
Glucose, Bld: 99 mg/dL (ref 70–99)
Potassium: 5 meq/L (ref 3.5–5.3)
Sodium: 126 meq/L — ABNORMAL LOW (ref 135–145)

## 2013-11-07 ENCOUNTER — Telehealth: Payer: Self-pay | Admitting: Family Medicine

## 2013-11-07 DIAGNOSIS — R7989 Other specified abnormal findings of blood chemistry: Secondary | ICD-10-CM

## 2013-11-07 DIAGNOSIS — E871 Hypo-osmolality and hyponatremia: Secondary | ICD-10-CM

## 2013-11-07 NOTE — Telephone Encounter (Signed)
Spoke with patient about labs, his SOB is about the same. Off  Oxygen with minimal exertion spo2 88%, on 2.5 L [facility] then up to 93-94%. Recent hospital dc fro CHF exacerbation, multiple times in last 6 months. He has been on Lasix 80 mg BID but kidney function worsening. Today Creatinine has bumped up to 1.48 and Na is 126, slightly nauseated but no other sxs ie confusion, lethargy, abd pain, CP. Has lost 2 lbs since 11/03/13. He diuresed 2 L in 24 hours yesterday.  Spoke with Dr Graciela HusbandsKlein about decreasing LAsix, will go to Lasix 80 mg daily, moniotr weight, salt and water intake. He recommended enrolling patient in [facility]. I have called and they will try to get him an appt. Shaune PascalCamilla will call me. His next appt with [facility] which is a normal 6 week F/u is on 11/16/13. He will return to office tomorrow to get a BMP due to elevated creatinine and hyponatremia. Precautions given to FU sooner or go to Er prn

## 2013-11-08 ENCOUNTER — Other Ambulatory Visit (INDEPENDENT_AMBULATORY_CARE_PROVIDER_SITE_OTHER): Payer: Medicare Other

## 2013-11-08 DIAGNOSIS — E871 Hypo-osmolality and hyponatremia: Secondary | ICD-10-CM

## 2013-11-08 DIAGNOSIS — R7989 Other specified abnormal findings of blood chemistry: Secondary | ICD-10-CM

## 2013-11-08 DIAGNOSIS — R799 Abnormal finding of blood chemistry, unspecified: Secondary | ICD-10-CM

## 2013-11-09 ENCOUNTER — Telehealth: Payer: Self-pay | Admitting: *Deleted

## 2013-11-09 ENCOUNTER — Telehealth: Payer: Self-pay | Admitting: Family Medicine

## 2013-11-09 LAB — BASIC METABOLIC PANEL
CO2: 28 mEq/L (ref 19–32)
Calcium: 9.2 mg/dL (ref 8.4–10.5)
Creat: 1.29 mg/dL (ref 0.50–1.35)
Glucose, Bld: 100 mg/dL — ABNORMAL HIGH (ref 70–99)
Sodium: 130 mEq/L — ABNORMAL LOW (ref 135–145)

## 2013-11-09 LAB — BASIC METABOLIC PANEL WITH GFR
BUN: 20 mg/dL (ref 6–23)
Chloride: 93 meq/L — ABNORMAL LOW (ref 96–112)
Potassium: 5.3 meq/L (ref 3.5–5.3)

## 2013-11-09 NOTE — Telephone Encounter (Signed)
Advised Andrew [name] and wife of note from Dr. Conley RollsLe.  See previous phone message

## 2013-11-09 NOTE — Telephone Encounter (Signed)
LM with patient, creatinine back to normal, his hyponatremia is improved but still abnormal at 130. Advise to monitor for weight gain, worsening SOB, Andrew [name] edema. He will cont with Lasix 80 mg daily and then take LAsix 80 mg in the PM prn. Still awaiting for CHF clinic appt. Return to clincin for lab BMP on MOnday for hyponatremia.

## 2013-11-10 ENCOUNTER — Ambulatory Visit (INDEPENDENT_AMBULATORY_CARE_PROVIDER_SITE_OTHER): Payer: Medicare Other | Admitting: Emergency Medicine

## 2013-11-10 ENCOUNTER — Ambulatory Visit (INDEPENDENT_AMBULATORY_CARE_PROVIDER_SITE_OTHER): Payer: Medicare Other

## 2013-11-10 VITALS — BP 120/64 | HR 60 | Temp 97.5°F | Resp 20 | Ht 69.5 in | Wt 172.5 lb

## 2013-11-10 DIAGNOSIS — R079 Chest pain, unspecified: Secondary | ICD-10-CM

## 2013-11-10 DIAGNOSIS — S2232XA Fracture of one rib, left side, initial encounter for closed fracture: Secondary | ICD-10-CM

## 2013-11-10 DIAGNOSIS — S0990XA Unspecified injury of head, initial encounter: Secondary | ICD-10-CM

## 2013-11-10 DIAGNOSIS — W19XXXA Unspecified fall, initial encounter: Secondary | ICD-10-CM

## 2013-11-10 DIAGNOSIS — S2239XA Fracture of one rib, unspecified side, initial encounter for closed fracture: Secondary | ICD-10-CM

## 2013-11-10 NOTE — Progress Notes (Addendum)
Subjective:  This chart was scribed for Lesle Chris, MD by Elveria Rising, Medial Scribe. This patient was seen in room 7 and the patient's care was started at 12:32 PM.    Patient ID: Andrew [name], male    DOB: April 14, 1930, 80 y.o.   MRN: [id number]  HPI HPI Comments: Andrew [name] is a 80 y.o. male who presents to the [facility] after falling today. Patient in usual state of health until early this morning when he tripped on his oxygen cord and fell to the ground. He struck the left side of his chest and sustained laceration the left side of the his. He denies LOC. He denies weakness in extremities. His respiratory status is at baseline. He has known CHF.    Patient Active Problem List   Diagnosis Date Noted  . CAP (community acquired pneumonia), possible 10/23/2013  . Chronic respiratory failure 10/23/2013  . Other malaise and fatigue 10/23/2013  . Acute on chronic systolic CHF (congestive heart failure) 10/22/2013  . Urinary retention 06/26/2013  . COPD exacerbation 06/25/2013  . Iron deficiency anemia, unspecified 03/20/2013  . S/P biventricular cardiac pacemaker-[employer] 06/17/2011  . Atrial fibrillation 04/06/2011  . Coronary atherosclerosis of native coronary artery 01/15/2011  . Hyperlipidemia 11/26/2010  . Paroxysmal ventricular tachycardia 11/05/2010  . Ischemic cardiomyopathy 10/14/2010  . Atrial flutter   . Chronic systolic heart failure   . Hypertension   . Nonspecific (abnormal) findings on radiological and other examination of body structure 01/12/2010  . ABNORMAL LUNG XRAY 01/12/2010  . DYSPNEA 12/02/2009  . LIVER FUNCTION TESTS, ABNORMAL, HX OF 12/02/2009  . UNSPECIFIED ANEMIA 05/05/2009  . GERD 04/04/2007  . PERSONAL HISTORY OF COLONIC POLYPS 04/04/2007   Past Medical History  Diagnosis Date  . Chronic systolic heart failure     a. Chronic LV systolic failure. b. Echo in Dec 2011 showed EF of 25% - f/u echo 02/2011: Mild LVH, EF  15%, posterior lateral akinesis, inferior akinesis, grade 1 diastolic dysfunction, mild LAE. c. s/p BiV pacemaker 06/2011.  [name] Atrial flutter     a. Off amiodarone because of increased LFTs  . COPD (chronic obstructive pulmonary disease)     Chronic SOB, O2 dependent  . Myocardial infarction 1994  . Gastro - esophageal reflux   . Claudication   . BPH (benign prostatic hyperplasia)   . Coronary artery disease     a. Myocardial infarction in 1984,  99 and 07. b. H/o multiple RCA stents. c.  LHC 04/2011: without obstructive disease. oLM 25%, mLAD 30%, pCFX 25%, prox to mid RCA stent ok, dRCA 30% and 70%, pPDA 25%, mPL 25-30%, EF 25%.  [name] Hypertension   . Atrial fibrillation     a. On Xarelto.  . Hemorrhoids   . Hyperlipidemia     statin intolerant 2/2 myalgias  . Hx of adenomatous colonic polyps   . Hx of colonoscopy   . Chronic renal insufficiency   . Biventricular cardiac pacemaker in situ 3.2013  . NSVT (nonsustained ventricular tachycardia)     a. 04/2012- 6 beats on tele.  [name] Shortness of breath   . Arthritis     hips ,back ,hands  . Iron deficiency anemia, unspecified 03/20/2013   Past Surgical History  Procedure Laterality Date  . Cardiac catheterization  2008  . Cataract extraction    . Salivary gland surgery    . Coronary angioplasty with stent placement    . Insert / replace / remove pacemaker  06/16/2011  . Tee without cardioversion  05/08/2012    Procedure: TRANSESOPHAGEAL ECHOCARDIOGRAM (TEE);  Surgeon: Vesta MixerPhilip J Nahser, MD;  [facility];  Service: Cardiovascular;  Laterality: N/A;   Allergies  Allergen Reactions  . Codeine Nausea And Vomiting  . Inspra [Eplerenone] Other (See Comments)    Stomach problems  . Spironolactone Other (See Comments)    Gynecomastia  . Doxycycline Itching and Rash  . Sulfonamide Derivatives Itching and Rash   Prior to Admission medications   Medication Sig Start Date End Date Taking? Authorizing Provider  albuterol (PROVENTIL  HFA;VENTOLIN HFA) 108 (90 BASE) MCG/ACT inhaler Inhale 1 puff into the lungs every 6 (six) hours as needed for wheezing or shortness of breath.    Historical Provider, MD  albuterol (PROVENTIL) (5 MG/ML) 0.5% nebulizer solution Take 2.5 mg by nebulization every 6 (six) hours as needed for wheezing or shortness of breath.    Historical Provider, MD  ALPRAZolam Prudy Feeler(XANAX) 0.25 MG tablet Take 0.25 mg by mouth daily as needed. For anxiety. 06/23/10   Roger ShelterStanley Tennant, MD  benzonatate (TESSALON) 100 MG capsule Take 1 capsule (100 mg total) by mouth 2 (two) times daily as needed for cough. 11/03/13   Thao P Le, DO  carvedilol (COREG) 25 MG tablet Take 25 mg by mouth 2 (two) times daily with a meal.    Historical Provider, MD  esomeprazole (NEXIUM) 40 MG capsule Take 40 mg by mouth 2 (two) times daily before a meal.    Historical Provider, MD  fluticasone (FLONASE) 50 MCG/ACT nasal spray Place 2 sprays into both nostrils as needed for allergies. 07/04/13 07/10/14  Collene GobbleSteven A Dawnisha Marquina, MD  Fluticasone-Salmeterol (ADVAIR) 250-50 MCG/DOSE AEPB Inhale 1 puff into the lungs 2 (two) times daily.    Historical Provider, MD  furosemide (LASIX) 80 MG tablet Take 1 tablet (80 mg total) by mouth 2 (two) times daily. 10/27/13   Joseph ArtJessica U Vann, DO  HYDROcodone-acetaminophen (NORCO/VICODIN) 5-325 MG per tablet Take 1 tablet by mouth every 6 (six) hours. Arthritis pain    Historical Provider, MD  ipratropium (ATROVENT) 0.02 % nebulizer solution Take 2.5 mLs (500 mcg total) by nebulization 4 (four) times daily. 10/02/12   Eleanore Delia ChimesE Egan, PA-C  losartan (COZAAR) 50 MG tablet Take 50 mg by mouth 2 (two) times daily.    Historical Provider, MD  meclizine (ANTIVERT) 25 MG tablet Take 12.5-25 mg by mouth 3 (three) times daily as needed for dizziness.    Historical Provider, MD  nitroGLYCERIN (NITROSTAT) 0.4 MG SL tablet Place 0.4 mg under the tongue every 5 (five) minutes as needed for chest pain.    Historical Provider, MD  NON FORMULARY Place 2  L into the nose daily.    Historical Provider, MD  potassium chloride (K-DUR,KLOR-CON) 10 MEQ tablet Take 10 mEq by mouth every other day.    Historical Provider, MD  rivaroxaban (XARELTO) 20 MG TABS tablet Take 1 tablet (20 mg total) by mouth daily with supper. 08/10/13   Rosalio MacadamiaLori C Gerhardt, NP  Tamsulosin HCl (FLOMAX) 0.4 MG CAPS Take 0.4 mg by mouth 2 (two) times daily.     Historical Provider, MD   History   Social History  . Marital Status: Married    Spouse Name: N/A    Number of Children: N/A  . Years of Education: N/A   Occupational History  . retired Designer, industrial/productadministrative work    Social History Main Topics  . Smoking status: Former Smoker -- 1.50 packs/day for 55 years  Types: Cigarettes    Start date: 05/21/1954    Quit date: 01/03/2009  . Smokeless tobacco: Never Used     Comment: quit 4 years ago  . Alcohol Use: No     Comment: occasional  . Drug Use: No  . Sexual Activity: Not Currently   Other Topics Concern  . Not on file   Social History Narrative  . No narrative on file    Review of Systems  Constitutional: Negative for fever and chills.  Musculoskeletal:       Laceration  Neurological: Positive for headaches. Negative for weakness and numbness.       Objective:   Physical Exam  CONSTITUTIONAL: Well developed/well nourished HEAD: Normocephalic/atraumatic; 5 inch laceration on top of head posteriorly, no actively bleeding  EYES: EOMI/PERRL ENMT: Mucous membranes moist NECK: supple no meningeal signs SPINE:entire spine nontender CV: S1/S2 noted, no murmurs/rubs/gallops noted LUNGS: Lungs are clear to auscultation bilaterally, no apparent distress; rales with decreased breath sounds in left base; significant  tenderness to left lateral chest wall  ABDOMEN: soft, nontender, no rebound or guarding GU:no cva tenderness NEURO: Pt is awake/alert, moves all extremitiesx4 EXTREMITIES: pulses normal, full ROM SKIN: warm, color normal PSYCH: no abnormalities of  mood noted  Filed Vitals:   11/10/13 1154  BP: 120/64  Pulse: 60  Temp: 97.5 F (36.4 C)  TempSrc: Oral  Resp: 20  Height: 5' 9.5" (1.765 m)  Weight: 172 lb 8 oz (78.245 kg)  SpO2: 93%  *. [facility] reading (PRIMARY) by  Dr Cleta Alberts is a dual-chamber pacemaker in place. Heart size is enlarged. There are chronic changes in both lung bases. There may be a crack nondisplaced in the left 11th rib but no definite rib fractures are seen. There is no pneumothorax      Assessment & Plan:  The radiologist felt there could be a fracture of the left ninth rib. We'll keep ears clean with soap and water staple removal in about 10 days he is to have followup blood work on Monday. I personally performed the services described in this documentation, which was scribed in my presence. The recorded information has been reviewed and is accurate.

## 2013-11-10 NOTE — Patient Instructions (Signed)

## 2013-11-10 NOTE — Progress Notes (Signed)
Patient ID: Andrew [name] MRN: [id number], DOB: 1930/07/31, 80 y.o. Date of Encounter: 11/10/2013, 1:04 PM   PROCEDURE NOTE: Verbal consent obtained. Sterile technique employed. Numbing: Anesthesia obtained with 1% lidocaine with epinephrine.   Cleansed with soap and water. Irrigated.  Wound explored, no deep structures involved, no foreign bodies.   Wound repaired with # 14 staples.  Hemostasis obtained. Wound cleansed and dressed.  Wound care instructions including precautions covered with patient. Handout given.  Anticipate suture removal in 7-10 days  Rhoderick MoodyHeather Vicky Mccanless, PA-C 11/10/2013 1:04 PM

## 2013-11-11 ENCOUNTER — Emergency Department (HOSPITAL_COMMUNITY)
Admission: EM | Admit: 2013-11-11 | Discharge: 2013-11-11 | Disposition: A | Discharge status: Home / Self Care | Payer: Medicare Other | Attending: Emergency Medicine | Admitting: Emergency Medicine

## 2013-11-11 ENCOUNTER — Telehealth: Payer: Self-pay | Admitting: Emergency Medicine

## 2013-11-11 ENCOUNTER — Encounter (HOSPITAL_COMMUNITY): Payer: Self-pay | Admitting: Emergency Medicine

## 2013-11-11 DIAGNOSIS — Z91199 Patient's noncompliance with other medical treatment and regimen due to unspecified reason: Secondary | ICD-10-CM | POA: Insufficient documentation

## 2013-11-11 DIAGNOSIS — I472 Ventricular tachycardia, unspecified: Secondary | ICD-10-CM | POA: Insufficient documentation

## 2013-11-11 DIAGNOSIS — J449 Chronic obstructive pulmonary disease, unspecified: Secondary | ICD-10-CM | POA: Insufficient documentation

## 2013-11-11 DIAGNOSIS — I4729 Other ventricular tachycardia: Secondary | ICD-10-CM | POA: Diagnosis not present

## 2013-11-11 DIAGNOSIS — K219 Gastro-esophageal reflux disease without esophagitis: Secondary | ICD-10-CM | POA: Diagnosis not present

## 2013-11-11 DIAGNOSIS — N189 Chronic kidney disease, unspecified: Secondary | ICD-10-CM | POA: Insufficient documentation

## 2013-11-11 DIAGNOSIS — Z9889 Other specified postprocedural states: Secondary | ICD-10-CM | POA: Diagnosis not present

## 2013-11-11 DIAGNOSIS — I4892 Unspecified atrial flutter: Secondary | ICD-10-CM | POA: Insufficient documentation

## 2013-11-11 DIAGNOSIS — IMO0002 Reserved for concepts with insufficient information to code with codable children: Secondary | ICD-10-CM | POA: Diagnosis not present

## 2013-11-11 DIAGNOSIS — I129 Hypertensive chronic kidney disease with stage 1 through stage 4 chronic kidney disease, or unspecified chronic kidney disease: Secondary | ICD-10-CM | POA: Diagnosis not present

## 2013-11-11 DIAGNOSIS — I4891 Unspecified atrial fibrillation: Secondary | ICD-10-CM | POA: Diagnosis not present

## 2013-11-11 DIAGNOSIS — Z860100 Personal history of colon polyps, unspecified: Secondary | ICD-10-CM | POA: Insufficient documentation

## 2013-11-11 DIAGNOSIS — Z9119 Patient's noncompliance with other medical treatment and regimen: Secondary | ICD-10-CM | POA: Diagnosis not present

## 2013-11-11 DIAGNOSIS — Z950 Presence of cardiac pacemaker: Secondary | ICD-10-CM | POA: Insufficient documentation

## 2013-11-11 DIAGNOSIS — Y838 Other surgical procedures as the cause of abnormal reaction of the patient, or of later complication, without mention of misadventure at the time of the procedure: Secondary | ICD-10-CM | POA: Diagnosis not present

## 2013-11-11 DIAGNOSIS — Z8739 Personal history of other diseases of the musculoskeletal system and connective tissue: Secondary | ICD-10-CM | POA: Diagnosis not present

## 2013-11-11 DIAGNOSIS — I2510 Atherosclerotic heart disease of native coronary artery without angina pectoris: Secondary | ICD-10-CM | POA: Diagnosis not present

## 2013-11-11 DIAGNOSIS — I252 Old myocardial infarction: Secondary | ICD-10-CM | POA: Diagnosis not present

## 2013-11-11 DIAGNOSIS — I5022 Chronic systolic (congestive) heart failure: Secondary | ICD-10-CM | POA: Diagnosis not present

## 2013-11-11 DIAGNOSIS — Z87891 Personal history of nicotine dependence: Secondary | ICD-10-CM | POA: Diagnosis not present

## 2013-11-11 DIAGNOSIS — Z862 Personal history of diseases of the blood and blood-forming organs and certain disorders involving the immune mechanism: Secondary | ICD-10-CM | POA: Diagnosis not present

## 2013-11-11 DIAGNOSIS — Z79899 Other long term (current) drug therapy: Secondary | ICD-10-CM | POA: Insufficient documentation

## 2013-11-11 DIAGNOSIS — R58 Hemorrhage, not elsewhere classified: Secondary | ICD-10-CM

## 2013-11-11 DIAGNOSIS — J4489 Other specified chronic obstructive pulmonary disease: Secondary | ICD-10-CM | POA: Insufficient documentation

## 2013-11-11 NOTE — ED Provider Notes (Signed)
CSN: [id number]     Arrival date & time 11/11/13  0038 History   First MD Initiated Contact with Patient 11/11/13 0056     Chief Complaint  Patient presents with  . Bleeding/Bruising     (Consider location/radiation/quality/duration/timing/severity/associated sxs/prior Treatment) HPI Comments: 80 year old male who takes anticoagulants secondary to heart disease presents with a complaint of bleeding from a scalp wound which was repaired earlier in the day at the urgent care. This was repaired with staples, the family is concerned because the bleeding that happened when they left and has continued since being home. It did not change the dressings at this time. There is no significant headache, neck pain, shortness of breath or change in vision.  The history is provided by the patient and a relative.    Past Medical History  Diagnosis Date  . Chronic systolic heart failure     a. Chronic LV systolic failure. b. Echo in Dec 2011 showed EF of 25% - f/u echo 02/2011: Mild LVH, EF 15%, posterior lateral akinesis, inferior akinesis, grade 1 diastolic dysfunction, mild LAE. c. s/p BiV pacemaker 06/2011.  [name] Atrial flutter     a. Off amiodarone because of increased LFTs  . COPD (chronic obstructive pulmonary disease)     Chronic SOB, O2 dependent  . Myocardial infarction 1994  . Gastro - esophageal reflux   . Claudication   . BPH (benign prostatic hyperplasia)   . Coronary artery disease     a. Myocardial infarction in 1984,  99 and 07. b. H/o multiple RCA stents. c.  LHC 04/2011: without obstructive disease. oLM 25%, mLAD 30%, pCFX 25%, prox to mid RCA stent ok, dRCA 30% and 70%, pPDA 25%, mPL 25-30%, EF 25%.  [name] Hypertension   . Atrial fibrillation     a. On Xarelto.  . Hemorrhoids   . Hyperlipidemia     statin intolerant 2/2 myalgias  . Hx of adenomatous colonic polyps   . Hx of colonoscopy   . Chronic renal insufficiency   . Biventricular cardiac pacemaker in situ 3.2013  . NSVT  (nonsustained ventricular tachycardia)     a. 04/2012- 6 beats on tele.  [name] Shortness of breath   . Arthritis     hips ,back ,hands  . Iron deficiency anemia, unspecified 03/20/2013   Past Surgical History  Procedure Laterality Date  . Cardiac catheterization  2008  . Cataract extraction    . Salivary gland surgery    . Coronary angioplasty with stent placement    . Insert / replace / remove pacemaker  06/16/2011  . Tee without cardioversion  05/08/2012    Procedure: TRANSESOPHAGEAL ECHOCARDIOGRAM (TEE);  Surgeon: Vesta Mixer, MD;  Location: [facility] ENDOSCOPY;  Service: Cardiovascular;  Laterality: N/A;   Family History  Problem Relation Age of Onset  . Coronary artery disease Father   . Heart disease Mother   . Colon cancer Neg Hx   . Heart disease Brother    History  Substance Use Topics  . Smoking status: Former Smoker -- 1.50 packs/day for 55 years    Types: Cigarettes    Start date: 05/21/1954    Quit date: 01/03/2009  . Smokeless tobacco: Never Used     Comment: quit 4 years ago  . Alcohol Use: No     Comment: occasional    Review of Systems  Constitutional: Negative for fever.  Respiratory: Negative for shortness of breath.   Hematological: Bruises/bleeds easily.      Allergies  Codeine;  Inspra; Spironolactone; Doxycycline; and Sulfonamide derivatives  Home Medications   Prior to Admission medications   Medication Sig Start Date End Date Taking? Authorizing Provider  albuterol (PROVENTIL HFA;VENTOLIN HFA) 108 (90 BASE) MCG/ACT inhaler Inhale 1 puff into the lungs every 6 (six) hours as needed for wheezing or shortness of breath.    Historical Provider, MD  albuterol (PROVENTIL) (5 MG/ML) 0.5% nebulizer solution Take 2.5 mg by nebulization every 6 (six) hours as needed for wheezing or shortness of breath.    Historical Provider, MD  ALPRAZolam Prudy Feeler(XANAX) 0.25 MG tablet Take 0.25 mg by mouth daily as needed. For anxiety. 06/23/10   Roger ShelterStanley Tennant, MD  benzonatate  (TESSALON) 100 MG capsule Take 1 capsule (100 mg total) by mouth 2 (two) times daily as needed for cough. 11/03/13   Thao P Le, DO  carvedilol (COREG) 25 MG tablet Take 25 mg by mouth 2 (two) times daily with a meal.    Historical Provider, MD  esomeprazole (NEXIUM) 40 MG capsule Take 40 mg by mouth 2 (two) times daily before a meal.    Historical Provider, MD  fluticasone (FLONASE) 50 MCG/ACT nasal spray Place 2 sprays into both nostrils as needed for allergies. 07/04/13 07/10/14  Collene GobbleSteven A Daub, MD  Fluticasone-Salmeterol (ADVAIR) 250-50 MCG/DOSE AEPB Inhale 1 puff into the lungs 2 (two) times daily.    Historical Provider, MD  furosemide (LASIX) 80 MG tablet Take 80 mg by mouth daily. Can take additional 80 mg as needed 10/27/13   Joseph ArtJessica U Vann, DO  HYDROcodone-acetaminophen (NORCO/VICODIN) 5-325 MG per tablet Take 1 tablet by mouth every 6 (six) hours. Arthritis pain    Historical Provider, MD  ipratropium (ATROVENT) 0.02 % nebulizer solution Take 2.5 mLs (500 mcg total) by nebulization 4 (four) times daily. 10/02/12   Eleanore Delia ChimesE Egan, PA-C  losartan (COZAAR) 50 MG tablet Take 50 mg by mouth 2 (two) times daily.    Historical Provider, MD  meclizine (ANTIVERT) 25 MG tablet Take 12.5-25 mg by mouth 3 (three) times daily as needed for dizziness.    Historical Provider, MD  nitroGLYCERIN (NITROSTAT) 0.4 MG SL tablet Place 0.4 mg under the tongue every 5 (five) minutes as needed for chest pain.    Historical Provider, MD  NON FORMULARY Place 2 L into the nose daily.    Historical Provider, MD  potassium chloride (K-DUR,KLOR-CON) 10 MEQ tablet Take 10 mEq by mouth every other day.    Historical Provider, MD  rivaroxaban (XARELTO) 20 MG TABS tablet Take 1 tablet (20 mg total) by mouth daily with supper. 08/10/13   Rosalio MacadamiaLori C Gerhardt, NP  Tamsulosin HCl (FLOMAX) 0.4 MG CAPS Take 0.4 mg by mouth 2 (two) times daily.     Historical Provider, MD   BP 167/100  Pulse 71  Temp(Src) 97.5 F (36.4 C) (Oral)  Resp 18   SpO2 98% Physical Exam  Nursing note and vitals reviewed. Constitutional: He appears well-developed and well-nourished. No distress.  HENT:  Head: Normocephalic.  Mouth/Throat: Oropharynx is clear and moist.  Laceration to the posterior scalp repaired with staples, nonbleeding, dressing with mild amount of blood soaked in  Eyes: Conjunctivae are normal. Right eye exhibits no discharge. Left eye exhibits no discharge. No scleral icterus.  Pulmonary/Chest: Effort normal. No respiratory distress.  Neurological: He is alert. Coordination normal.  Speech is clear, coordination is normal  Skin: Skin is warm and dry. No rash noted. He is not diaphoretic.    ED Course  Procedures (including  critical care time) Labs Review Labs Reviewed - No data to display  Imaging Review Dg Ribs Unilateral W/chest Left  11/10/2013   CLINICAL DATA:  Fall with left rib pain.  EXAM: LEFT RIBS AND CHEST - 3+ VIEW  COMPARISON:  Chest x-ray on 11/03/2013  FINDINGS: Stable chronic lung disease in appearance of biventricular pacing device. No pneumothorax or pleural effusion. No pulmonary consolidation.  There is minimally displaced irregularity of the lateral aspect of the left ninth rib. It is difficult to determine from the x-ray appearance whether this is acute or old. No other rib abnormalities are identified.  IMPRESSION: New versus old fracture of the left ninth rib. No associated pneumothorax or hemothorax.   Electronically Signed   By: Irish Lack M.D.   On: 11/10/2013 13:10      MDM   Final diagnoses:  Bleeding    Bleeding is under control, I have rewrapped the patient's wound with a sterile dressing, instructed family on wound care, patient has no shortness of breath related to the rib fracture found on his x-rays, stable for discharge, understanding expressed by the patient and family member.   Vida Roller, MD 11/11/13 [phone]

## 2013-11-11 NOTE — ED Notes (Addendum)
Pt. reports bleeding at scalp laceration stapled yesterday at [facility]  after he fell at home yesterday morning , alert and oriented / reports pain at left lateral ribcage . Pt. takes Xarelto q pm.

## 2013-11-11 NOTE — Discharge Instructions (Signed)
change the dressings in 24 hours, return to the hospital for worsening bleeding

## 2013-11-11 NOTE — Telephone Encounter (Signed)
Spoke with patient since he went to the emergency room last night. He is feeling well this morning denies headache or any neurological symptoms. He will let us know if he needs to be seen again today. The wound is wrapped he has not experienced bleeding through the dressing.

## 2013-11-12 ENCOUNTER — Other Ambulatory Visit (INDEPENDENT_AMBULATORY_CARE_PROVIDER_SITE_OTHER): Payer: Medicare Other | Admitting: Family Medicine

## 2013-11-12 DIAGNOSIS — E871 Hypo-osmolality and hyponatremia: Secondary | ICD-10-CM

## 2013-11-12 LAB — BASIC METABOLIC PANEL
CO2: 28 mEq/L (ref 19–32)
Calcium: 8.7 mg/dL (ref 8.4–10.5)
Creat: 1.43 mg/dL — ABNORMAL HIGH (ref 0.50–1.35)
Glucose, Bld: 100 mg/dL — ABNORMAL HIGH (ref 70–99)

## 2013-11-12 LAB — BASIC METABOLIC PANEL WITH GFR
BUN: 24 mg/dL — ABNORMAL HIGH (ref 6–23)
Chloride: 97 meq/L (ref 96–112)
Potassium: 4.3 meq/L (ref 3.5–5.3)
Sodium: 131 meq/L — ABNORMAL LOW (ref 135–145)

## 2013-11-12 NOTE — Progress Notes (Signed)
Patient here today for Labs only. 

## 2013-11-14 ENCOUNTER — Telehealth: Payer: Self-pay | Admitting: Family Medicine

## 2013-11-15 ENCOUNTER — Ambulatory Visit (HOSPITAL_COMMUNITY)
Admission: RE | Admit: 2013-11-15 | Discharge: 2013-11-15 | Disposition: A | Discharge status: Home / Self Care | Payer: Medicare Other | Source: Ambulatory Visit | Attending: Internal Medicine | Admitting: Internal Medicine

## 2013-11-15 ENCOUNTER — Encounter (HOSPITAL_COMMUNITY): Payer: Self-pay

## 2013-11-15 VITALS — BP 110/62 | HR 51 | Ht 70.0 in | Wt 178.0 lb

## 2013-11-15 DIAGNOSIS — I5022 Chronic systolic (congestive) heart failure: Secondary | ICD-10-CM

## 2013-11-15 DIAGNOSIS — J4489 Other specified chronic obstructive pulmonary disease: Secondary | ICD-10-CM | POA: Insufficient documentation

## 2013-11-15 DIAGNOSIS — I4892 Unspecified atrial flutter: Secondary | ICD-10-CM | POA: Insufficient documentation

## 2013-11-15 DIAGNOSIS — Z9981 Dependence on supplemental oxygen: Secondary | ICD-10-CM | POA: Diagnosis not present

## 2013-11-15 DIAGNOSIS — R5383 Other fatigue: Secondary | ICD-10-CM

## 2013-11-15 DIAGNOSIS — I2510 Atherosclerotic heart disease of native coronary artery without angina pectoris: Secondary | ICD-10-CM | POA: Insufficient documentation

## 2013-11-15 DIAGNOSIS — I5023 Acute on chronic systolic (congestive) heart failure: Secondary | ICD-10-CM | POA: Insufficient documentation

## 2013-11-15 DIAGNOSIS — D509 Iron deficiency anemia, unspecified: Secondary | ICD-10-CM | POA: Insufficient documentation

## 2013-11-15 DIAGNOSIS — I4891 Unspecified atrial fibrillation: Secondary | ICD-10-CM

## 2013-11-15 DIAGNOSIS — J449 Chronic obstructive pulmonary disease, unspecified: Secondary | ICD-10-CM | POA: Diagnosis not present

## 2013-11-15 DIAGNOSIS — R5381 Other malaise: Secondary | ICD-10-CM

## 2013-11-15 DIAGNOSIS — I480 Paroxysmal atrial fibrillation: Secondary | ICD-10-CM

## 2013-11-15 LAB — BASIC METABOLIC PANEL
ANION GAP: 11 (ref 5–15)
BUN: 23 mg/dL (ref 6–23)
CO2: 26 mEq/L (ref 19–32)
Calcium: 9.2 mg/dL (ref 8.4–10.5)
Chloride: 96 mEq/L (ref 96–112)
Creatinine, Ser: 1.4 mg/dL — ABNORMAL HIGH (ref 0.50–1.35)
GFR calc non Af Amer: 46 mL/min — ABNORMAL LOW (ref >90)
GFR, EST AFRICAN AMERICAN: 53 mL/min — AB (ref >90)
Glucose, Bld: 101 mg/dL — ABNORMAL HIGH (ref 70–99)
POTASSIUM: 5.4 meq/L — AB (ref 3.7–5.3)
Sodium: 133 mEq/L — ABNORMAL LOW (ref 137–147)

## 2013-11-15 LAB — CBC WITH DIFFERENTIAL/PLATELET
BASOS ABS: 0 10*3/uL (ref 0.0–0.1)
Basophils Relative: 0 % (ref 0–1)
EOS PCT: 4 % (ref 0–5)
Eosinophils Absolute: 0.3 10*3/uL (ref 0.0–0.7)
HCT: 35.8 % — ABNORMAL LOW (ref 39.0–52.0)
Hemoglobin: 12 g/dL — ABNORMAL LOW (ref 13.0–17.0)
LYMPHS ABS: 0.8 10*3/uL (ref 0.7–4.0)
LYMPHS PCT: 10 % — AB (ref 12–46)
MCH: 31.7 pg (ref 26.0–34.0)
MCHC: 33.5 g/dL (ref 30.0–36.0)
MCV: 94.7 fL (ref 78.0–100.0)
Monocytes Absolute: 1 10*3/uL (ref 0.1–1.0)
Monocytes Relative: 12 % (ref 3–12)
NEUTROS ABS: 6.2 10*3/uL (ref 1.7–7.7)
Neutrophils Relative %: 74 % (ref 43–77)
Platelets: 257 10*3/uL (ref 150–400)
RBC: 3.78 MIL/uL — AB (ref 4.22–5.81)
RDW: 13.7 % (ref 11.5–15.5)
WBC: 8.4 10*3/uL (ref 4.0–10.5)

## 2013-11-15 LAB — TSH: TSH: 5.07 u[IU]/mL — AB (ref 0.350–4.500)

## 2013-11-15 MED ORDER — CARVEDILOL 25 MG PO TABS: 12.5000 mg | ORAL_TABLET | Freq: Two times a day (BID) | ORAL | Status: DC

## 2013-11-15 NOTE — Progress Notes (Signed)
Please forward this to Dr. Conley RollsLe

## 2013-11-15 NOTE — Progress Notes (Signed)
Patient ID: Andrew [name], male   DOB: 04-09-1930, 80 y.o.   MRN: [id number]   Andrew [name] Date of Birth: 1930/05/14 Medical Record #[id number] Primary cardiologist: Hochrein/Klein PCP: Daub/Le  History of Present Illness: Ed is an 80 y/o male with a complex medical history which includes COPD (on home O2), CAD with remote MIs and multiple stents, paroxysmal AF/AFL, systolic HF EF 30-35% ( July 2015) was 15% in 2012. He is s/p [employer] CRT-D. Referred by Dr. Conley Rolls for further management of his HF,  Other issues include HTN, HLD, CRI, iron deficiency anemia - receiving IV iron. Was admitted back earlier in 2014 with sepsis - had ampicillin sensitive enterococcal bacteremia. His TEE was negative for a vegetation on his device.   Was admitted at the end of March of 2015 with COPD exacerbation and urinary retention. Xarelto dose was cut back due to his kidney function - has now normalized and he was put back on regular dose. Was admitted in July with HF and AFL.   He is on Xarelto for AF. Previously on amio for a long time but stopped to elevation of LFTs.  Referred by Dr. Conley Rolls for ongoing fatigue. Says he just hasn't bounced back from his recent hospitalization. Has severe DOE at 100 feet. No CP or arm pain. Poor appetite. No palpitations. Mild ankle edema. Weight has been going down. Lost 8 pounds in last month. BP ok. Early in summer he had a lot of edema. Lasix increased to 80 bid. More recently Fr. Conley Rolls has dropped him to 80 daily with extra as needed. Last week had a fall an got laceration on scalp and fractured left rib.    Current Outpatient Prescriptions  Medication Sig Dispense Refill  . albuterol (PROVENTIL HFA;VENTOLIN HFA) 108 (90 BASE) MCG/ACT inhaler Inhale 1 puff into the lungs every 6 (six) hours as needed for wheezing or shortness of breath.      [name] albuterol (PROVENTIL) (5 MG/ML) 0.5% nebulizer solution Take 2.5 mg by nebulization every 6 (six) hours as needed for wheezing or  shortness of breath.      . ALPRAZolam (XANAX) 0.25 MG tablet Take 0.25 mg by mouth daily as needed. For anxiety.      . benzonatate (TESSALON) 100 MG capsule Take 1 capsule (100 mg total) by mouth 2 (two) times daily as needed for cough.  30 capsule  1  . carvedilol (COREG) 25 MG tablet Take 25 mg by mouth 2 (two) times daily with a meal.      . esomeprazole (NEXIUM) 40 MG capsule Take 40 mg by mouth 2 (two) times daily before a meal.      . Fluticasone-Salmeterol (ADVAIR) 250-50 MCG/DOSE AEPB Inhale 1 puff into the lungs 2 (two) times daily.      . furosemide (LASIX) 80 MG tablet Take 80 mg by mouth daily. Can take additional 80 mg as needed      . HYDROcodone-acetaminophen (NORCO/VICODIN) 5-325 MG per tablet Take 1 tablet by mouth every 6 (six) hours. Arthritis pain      . ipratropium (ATROVENT) 0.02 % nebulizer solution Take 2.5 mLs (500 mcg total) by nebulization 4 (four) times daily.  75 mL  12  . losartan (COZAAR) 50 MG tablet Take 50 mg by mouth 2 (two) times daily.      . meclizine (ANTIVERT) 25 MG tablet Take 12.5-25 mg by mouth 3 (three) times daily as needed for dizziness.      . nitroGLYCERIN (NITROSTAT) 0.4 MG  SL tablet Place 0.4 mg under the tongue every 5 (five) minutes as needed for chest pain.      . NON FORMULARY Place 2 L into the nose daily.      . potassium chloride (K-DUR,KLOR-CON) 10 MEQ tablet Take 10 mEq by mouth every other day.      . rivaroxaban (XARELTO) 20 MG TABS tablet Take 1 tablet (20 mg total) by mouth daily with supper.  90 tablet  3  . Tamsulosin HCl (FLOMAX) 0.4 MG CAPS Take 0.4 mg by mouth 2 (two) times daily.       . fluticasone (FLONASE) 50 MCG/ACT nasal spray Place 2 sprays into both nostrils as needed for allergies.  16 g  11  . [DISCONTINUED] metoprolol tartrate (LOPRESSOR) 25 MG tablet Take 1/2 tablet every other day   45 tablet  3   No current facility-administered medications for this encounter.    Allergies  Allergen Reactions  . Codeine  Nausea And Vomiting  . Inspra [Eplerenone] Other (See Comments)    Stomach problems  . Spironolactone Other (See Comments)    Gynecomastia  . Doxycycline Itching and Rash  . Sulfonamide Derivatives Itching and Rash    Past Medical History  Diagnosis Date  . Chronic systolic heart failure     a. Chronic LV systolic failure. b. Echo in Dec 2011 showed EF of 25% - f/u echo 02/2011: Mild LVH, EF 15%, posterior lateral akinesis, inferior akinesis, grade 1 diastolic dysfunction, mild LAE. c. s/p BiV pacemaker 06/2011.  [name]. Atrial flutter     a. Off amiodarone because of increased LFTs  . COPD (chronic obstructive pulmonary disease)     Chronic SOB, O2 dependent  . Myocardial infarction 1994  . Gastro - esophageal reflux   . Claudication   . BPH (benign prostatic hyperplasia)   . Coronary artery disease     a. Myocardial infarction in 1984,  99 and 07. b. H/o multiple RCA stents. c.  LHC 04/2011: without obstructive disease. oLM 25%, mLAD 30%, pCFX 25%, prox to mid RCA stent ok, dRCA 30% and 70%, pPDA 25%, mPL 25-30%, EF 25%.  [name]. Hypertension   . Atrial fibrillation     a. On Xarelto.  . Hemorrhoids   . Hyperlipidemia     statin intolerant 2/2 myalgias  . Hx of adenomatous colonic polyps   . Hx of colonoscopy   . Chronic renal insufficiency   . Biventricular cardiac pacemaker in situ 3.2013  . NSVT (nonsustained ventricular tachycardia)     a. 04/2012- 6 beats on tele.  [name]. Shortness of breath   . Arthritis     hips ,back ,hands  . Iron deficiency anemia, unspecified 03/20/2013    Past Surgical History  Procedure Laterality Date  . Cardiac catheterization  2008  . Cataract extraction    . Salivary gland surgery    . Coronary angioplasty with stent placement    . Insert / replace / remove pacemaker  06/16/2011  . Tee without cardioversion  05/08/2012    Procedure: TRANSESOPHAGEAL ECHOCARDIOGRAM (TEE);  Surgeon: Vesta MixerPhilip J Nahser, MD;  [facility];  Service: Cardiovascular;   Laterality: N/A;    History  Smoking status  . Former Smoker -- 1.50 packs/day for 55 years  . Types: Cigarettes  . Start date: 05/21/1954  . Quit date: 01/03/2009  Smokeless tobacco  . Never Used    Comment: quit 4 years ago    History  Alcohol Use No    Comment:  occasional    Family History  Problem Relation Age of Onset  . Coronary artery disease Father   . Heart disease Mother   . Colon cancer Neg Hx   . Heart disease Brother     Review of Systems: The review of systems is per the HPI.  All other systems were reviewed and are negative.  Physical Exam: BP 110/62  Pulse 51  Ht 5\' 10"  (1.778 m)  Wt 178 lb (80.74 kg)  BMI 25.54 kg/m2  SpO2 98% General:  Elderly frail appearing HEENT: normal x for stapled laceration  Neck: supple. JVP 7. Carotids 2+ bilat; no bruits. No lymphadenopathy or thryomegaly appreciated. Cor: PMI nondisplaced. Regular rate & rhythm. No rubs, gallops or murmurs. Lungs: clear with decreased BS throughout Abdomen: soft, nontender, nondistended. No hepatosplenomegaly. No bruits or masses. Good bowel sounds. Extremities: no cyanosis, clubbing, rash, edema Neuro: alert & orientedx3, cranial nerves grossly intact. moves all 4 extremities w/o difficulty. Affect pleasant   Wt Readings from Last 3 Encounters:  11/15/13 178 lb (80.74 kg)  11/10/13 172 lb 8 oz (78.245 kg)  11/03/13 178 lb (80.74 kg)    LABORATORY DATA/PROCEDURES: PENDING  Lab Results  Component Value Date   WBC 8.0 10/22/2013   HGB 10.5* 10/22/2013   HCT 32.2* 10/22/2013   PLT 268 10/22/2013   GLUCOSE 100* 11/12/2013   CHOL 184 05/02/2013   TRIG 243.0* 05/02/2013   HDL 29.50* 05/02/2013   LDLDIRECT 119.4 05/02/2013   LDLCALC 83 03/17/2012   ALT <8 08/13/2013   AST 14 08/13/2013   NA 131* 11/12/2013   K 4.3 11/12/2013   CL 97 11/12/2013   CREATININE 1.43* 11/12/2013   BUN 24* 11/12/2013   CO2 28 11/12/2013   TSH 2.590 10/24/2013   INR 1.77* 05/03/2012   HGBA1C 6.0* 03/10/2011     BNP (last 3 results)  Recent Labs  06/25/13 1714 10/10/13 1507 10/22/13 1845  PROBNP 6196.0* 1021.0* 6264.0*   ECG. Sinus rhythm with v-pacing  Assessment / Plan:  1. Systolic HF due to ischemic CM (EF 30--35%) 2. COPD on home O2 3. CAD s/p multiple PCIs - managed medically - no evidence of ischemia (previous angina was arm pain)  4. History of atrial flutter/fib/VT/PVCs - now in NSR. no longer on amiodarone. On xarelto for anti-coag 5. Iron deficiency anemia - continues with IV iron infusions.  He currently has NYHA IIIB symptoms. Volume status looks fine. My suspicion is that he may have low output physiology or progressive fatigue is noncardiac. Will cut carvedilol back to 12.5 bid and see him back in 2 weeks. If not improved will need RHC to further evaluate need for potential inotrope support. Currently maintaining NSR. Continue Xarelto. Check labs today.   Optivol interrogation pending.   Total time spent 45 minutes. Over half that time spent discussing above with him and his wife, [name].   Daniel Bensimhon,MD 12:21 PM

## 2013-11-15 NOTE — Telephone Encounter (Signed)
Spoke to patient about labs. He has f/u with CHF clinic

## 2013-11-15 NOTE — Patient Instructions (Signed)
Labs today.  DECREASE Carvedilol to 12.5mg  daily  Your physician recommends that you schedule a follow-up appointment in: 2 weeks  Do the following things EVERYDAY: 1) Weigh yourself in the morning before breakfast. Write it down and keep it in a log. 2) Take your medicines as prescribed 3) Eat low salt foods-Limit salt (sodium) to 2000 mg per day.  4) Stay as active as you can everyday Limit all fluids for the day to less than 2 liters

## 2013-11-16 ENCOUNTER — Ambulatory Visit: Payer: Medicare Other | Admitting: Nurse Practitioner

## 2013-11-16 NOTE — Addendum Note (Signed)
Encounter addended by: Dejanee Thibeaux M Roberts, CCT on: 11/16/2013  8:39 AM<BR>     Documentation filed: Charges VN

## 2013-11-20 ENCOUNTER — Ambulatory Visit (INDEPENDENT_AMBULATORY_CARE_PROVIDER_SITE_OTHER): Payer: Medicare Other | Admitting: Internal Medicine

## 2013-11-20 VITALS — BP 138/82 | HR 79 | Temp 97.2°F | Resp 17 | Ht 69.0 in | Wt 181.0 lb

## 2013-11-20 DIAGNOSIS — Z5189 Encounter for other specified aftercare: Secondary | ICD-10-CM

## 2013-11-20 DIAGNOSIS — S0100XA Unspecified open wound of scalp, initial encounter: Secondary | ICD-10-CM

## 2013-11-20 DIAGNOSIS — S0101XD Laceration without foreign body of scalp, subsequent encounter: Secondary | ICD-10-CM

## 2013-11-20 DIAGNOSIS — Z4802 Encounter for removal of sutures: Secondary | ICD-10-CM

## 2013-11-20 NOTE — Progress Notes (Signed)
Here for followup of scalp laceration status post stapling He is asymptomatic His bleeding was eventually well controlled-note ER visit His heart failure symptoms have improved as well with the change in his blood pressure meds  Examination BP 138/82  Pulse 79  Temp(Src) 97.2 F (36.2 C) (Oral)  Resp 17  Ht 5\' 9"  (1.753 m)  Wt 181 lb (82.101 kg)  BMI 26.72 kg/m2  SpO2 89% His scalp wound is mostly healed with an intact scab  His 14 staples were removed-the staple at the posterior end of the wound is misshapen and impossible to remove/he experiences great pain with trying and so is given 1% lidocaine with epinephrine for anesthesia The staple was clipped with wire cutters and removed from each end with curved hemostat without complication He is not bleeding at the time of discharge  Impression Scalp laceration well healed  Followup is planned for his other medical problems

## 2013-11-21 ENCOUNTER — Other Ambulatory Visit (HOSPITAL_BASED_OUTPATIENT_CLINIC_OR_DEPARTMENT_OTHER): Payer: Medicare Other | Admitting: Lab

## 2013-11-21 ENCOUNTER — Ambulatory Visit (HOSPITAL_BASED_OUTPATIENT_CLINIC_OR_DEPARTMENT_OTHER): Payer: Medicare Other | Admitting: Hematology & Oncology

## 2013-11-21 ENCOUNTER — Encounter: Payer: Self-pay | Admitting: Hematology & Oncology

## 2013-11-21 VITALS — BP 155/62 | HR 64 | Temp 97.5°F | Resp 20 | Ht 68.0 in | Wt 179.0 lb

## 2013-11-21 DIAGNOSIS — D509 Iron deficiency anemia, unspecified: Secondary | ICD-10-CM

## 2013-11-21 LAB — CBC WITH DIFFERENTIAL (CANCER CENTER ONLY)
BASO#: 0 10*3/uL (ref 0.0–0.2)
BASO%: 0.3 % (ref 0.0–2.0)
EOS%: 3.1 % (ref 0.0–7.0)
Eosinophils Absolute: 0.2 10*3/uL (ref 0.0–0.5)
HCT: 36.5 % — ABNORMAL LOW (ref 38.7–49.9)
HGB: 12.3 g/dL — ABNORMAL LOW (ref 13.0–17.1)
LYMPH#: 0.8 10*3/uL — ABNORMAL LOW (ref 0.9–3.3)
LYMPH%: 10.8 % — ABNORMAL LOW (ref 14.0–48.0)
MCH: 32.8 pg (ref 28.0–33.4)
MCHC: 33.7 g/dL (ref 32.0–35.9)
MCV: 97 fL (ref 82–98)
MONO#: 0.7 10*3/uL (ref 0.1–0.9)
MONO%: 10.2 % (ref 0.0–13.0)
NEUT#: 5.3 10*3/uL (ref 1.5–6.5)
NEUT%: 75.6 % (ref 40.0–80.0)
Platelets: 247 10*3/uL (ref 145–400)
RBC: 3.75 10*6/uL — ABNORMAL LOW (ref 4.20–5.70)
RDW: 13.6 % (ref 11.1–15.7)
WBC: 7.1 10*3/uL (ref 4.0–10.0)

## 2013-11-21 LAB — IRON AND TIBC CHCC
%SAT: 38 % (ref 20–55)
Iron: 55 ug/dL (ref 42–163)
TIBC: 145 ug/dL — ABNORMAL LOW (ref 202–409)
UIBC: 90 ug/dL — ABNORMAL LOW (ref 117–376)

## 2013-11-21 LAB — RETICULOCYTES (CHCC)
ABS RETIC: 41.4 10*3/uL (ref 19.0–186.0)
RBC.: 3.76 MIL/uL — AB (ref 4.22–5.81)
Retic Ct Pct: 1.1 % (ref 0.4–2.3)

## 2013-11-21 LAB — FERRITIN CHCC: Ferritin: 708 ng/ml — ABNORMAL HIGH (ref 22–316)

## 2013-11-21 LAB — CHCC SATELLITE - SMEAR

## 2013-11-21 NOTE — Progress Notes (Signed)
Hematology and Oncology Follow Up Visit  Andrew [name] [id number] [birth year] 80 y.o. 11/21/2013   Principle Diagnosis:    Iron deficiency anemia  Erythropoitin deficiency  Congestive heart failure  Current Therapy:    V iron as indicated     Interim History:  Mr.  Andrew [name] is back for followup. He was last seen in July. Since then, he's been in the hospital. He's had problems with his heart failure.  She fell a couple weeks go. He sustained a gash in the top of his head. He required sutures for this.  He has been seen by cardiology. They have been adjusting his medications.  He has responded very nicely to iron.  She's had no problems with bleeding. Medications: Current outpatient prescriptions:albuterol (PROVENTIL HFA;VENTOLIN HFA) 108 (90 BASE) MCG/ACT inhaler, Inhale 1 puff into the lungs every 6 (six) hours as needed for wheezing or shortness of breath., Disp: , Rfl: ;  albuterol (PROVENTIL) (5 MG/ML) 0.5% nebulizer solution, Take 2.5 mg by nebulization every 6 (six) hours as needed for wheezing or shortness of breath., Disp: , Rfl:  ALPRAZolam (XANAX) 0.25 MG tablet, Take 0.25 mg by mouth daily as needed. For anxiety., Disp: , Rfl: ;  benzonatate (TESSALON) 100 MG capsule, Take 1 capsule (100 mg total) by mouth 2 (two) times daily as needed for cough., Disp: 30 capsule, Rfl: 1;  carvedilol (COREG) 25 MG tablet, Take 0.5 tablets (12.5 mg total) by mouth 2 (two) times daily with a meal., Disp: 15 tablet, Rfl: 6 esomeprazole (NEXIUM) 40 MG capsule, Take 40 mg by mouth 2 (two) times daily before a meal., Disp: , Rfl: ;  fluticasone (FLONASE) 50 MCG/ACT nasal spray, Place 2 sprays into both nostrils as needed for allergies., Disp: 16 g, Rfl: 11;  Fluticasone-Salmeterol (ADVAIR) 250-50 MCG/DOSE AEPB, Inhale 1 puff into the lungs 2 (two) times daily., Disp: , Rfl:  furosemide (LASIX) 80 MG tablet, Take 80 mg by mouth daily. Can take additional 80 mg as needed, Disp: , Rfl: ;   HYDROcodone-acetaminophen (NORCO/VICODIN) 5-325 MG per tablet, Take 1 tablet by mouth every 6 (six) hours. Arthritis pain, Disp: , Rfl: ;  ipratropium (ATROVENT) 0.02 % nebulizer solution, Take 2.5 mLs (500 mcg total) by nebulization 4 (four) times daily., Disp: 75 mL, Rfl: 12 losartan (COZAAR) 50 MG tablet, Take 50 mg by mouth 2 (two) times daily., Disp: , Rfl: ;  meclizine (ANTIVERT) 25 MG tablet, Take 12.5-25 mg by mouth 3 (three) times daily as needed for dizziness., Disp: , Rfl: ;  nitroGLYCERIN (NITROSTAT) 0.4 MG SL tablet, Place 0.4 mg under the tongue every 5 (five) minutes as needed for chest pain., Disp: , Rfl: ;  NON FORMULARY, Place 2 L into the nose daily., Disp: , Rfl:  potassium chloride (K-DUR,KLOR-CON) 10 MEQ tablet, Take 10 mEq by mouth every other day., Disp: , Rfl: ;  rivaroxaban (XARELTO) 20 MG TABS tablet, Take 1 tablet (20 mg total) by mouth daily with supper., Disp: 90 tablet, Rfl: 3;  Tamsulosin HCl (FLOMAX) 0.4 MG CAPS, Take 0.4 mg by mouth 2 (two) times daily. , Disp: , Rfl: ;  [DISCONTINUED] metoprolol tartrate (LOPRESSOR) 25 MG tablet, Take 1/2 tablet every other day , Disp: 45 tablet, Rfl: 3  Allergies:  Allergies  Allergen Reactions  . Codeine Nausea And Vomiting  . Inspra [Eplerenone] Other (See Comments)    Stomach problems  . Spironolactone Other (See Comments)    Gynecomastia  . Doxycycline Itching and Rash  . Sulfonamide  Derivatives Itching and Rash    Past Medical History, Surgical history, Social history, and Family History were reviewed and updated.  Review of Systems: As above  Physical Exam:  height is 5\' 8"  (1.727 m) and weight is 179 lb (81.194 kg). His oral temperature is 97.5 F (36.4 C). His blood pressure is 155/62 and his pulse is 64. His respiration is 20 and oxygen saturation is 98%.   Elderly white abdomen. Head and neck exam shows no ocular or oral lesions. He has no adenopathy in the neck. Lungs are with slight decrease at the bases.  Cardiac exam regular rate and rhythm with no murmurs rubs or bruits. Abdomen soft. Has good bowel sounds. There is no fluid wave. There is no palpable liver or spleen tip. Extremities show some chronic mild nonpitting edema of his legs. He has some stasis dermatitis changes in his legs. He has some age related arthritic changes. He has a decent strength. Neurological exam is nonfocal.  Lab Results  Component Value Date   WBC 7.1 11/21/2013   HGB 12.3* 11/21/2013   HCT 36.5* 11/21/2013   MCV 97 11/21/2013   PLT 247 11/21/2013     Chemistry      Component Value Date/Time   NA 133* 11/15/2013 1121   NA 136 01/17/2013 1337   K 5.4* 11/15/2013 1121   K 4.5 01/17/2013 1337   CL 96 11/15/2013 1121   CL 97* 01/17/2013 1337   CO2 26 11/15/2013 1121   CO2 31 01/17/2013 1337   BUN 23 11/15/2013 1121   BUN 18 01/17/2013 1337   CREATININE 1.40* 11/15/2013 1121   CREATININE 1.43* 11/12/2013 1019      Component Value Date/Time   CALCIUM 9.2 11/15/2013 1121   CALCIUM 8.9 01/17/2013 1337   ALKPHOS 70 08/13/2013 1149   AST 14 08/13/2013 1149   ALT <8 08/13/2013 1149   BILITOT 0.6 08/13/2013 1149         Impression and Plan: [name] is 29 we will abdomen. He has congestive heart failure. She has a history of iron deficiency. Again, the iron is helping him quite a. He last got iron back in early July. At that point on, his iron saturation was only 18.  We will see what his levels are.  With his MCV being 97, one would think that his iron levels should be okay.  I think we can probably get him back in another 2 months.   Josph Macho, MD 8/19/20159:42 AM

## 2013-11-22 ENCOUNTER — Other Ambulatory Visit: Payer: Medicare Other | Admitting: Lab

## 2013-11-22 ENCOUNTER — Ambulatory Visit: Payer: Medicare Other | Admitting: Hematology & Oncology

## 2013-11-23 ENCOUNTER — Other Ambulatory Visit: Payer: Self-pay | Admitting: Cardiology

## 2013-11-30 ENCOUNTER — Ambulatory Visit (HOSPITAL_COMMUNITY)
Admission: RE | Admit: 2013-11-30 | Discharge: 2013-11-30 | Disposition: A | Discharge status: Home / Self Care | Payer: Medicare Other | Source: Ambulatory Visit | Attending: Cardiology | Admitting: Cardiology

## 2013-11-30 ENCOUNTER — Telehealth (HOSPITAL_COMMUNITY): Payer: Self-pay | Admitting: Cardiology

## 2013-11-30 VITALS — BP 136/78 | HR 70 | Wt 177.8 lb

## 2013-11-30 DIAGNOSIS — K219 Gastro-esophageal reflux disease without esophagitis: Secondary | ICD-10-CM | POA: Diagnosis not present

## 2013-11-30 DIAGNOSIS — Z882 Allergy status to sulfonamides status: Secondary | ICD-10-CM | POA: Insufficient documentation

## 2013-11-30 DIAGNOSIS — I129 Hypertensive chronic kidney disease with stage 1 through stage 4 chronic kidney disease, or unspecified chronic kidney disease: Secondary | ICD-10-CM | POA: Diagnosis not present

## 2013-11-30 DIAGNOSIS — N400 Enlarged prostate without lower urinary tract symptoms: Secondary | ICD-10-CM | POA: Insufficient documentation

## 2013-11-30 DIAGNOSIS — E785 Hyperlipidemia, unspecified: Secondary | ICD-10-CM | POA: Diagnosis not present

## 2013-11-30 DIAGNOSIS — I4949 Other premature depolarization: Secondary | ICD-10-CM | POA: Diagnosis not present

## 2013-11-30 DIAGNOSIS — R5383 Other fatigue: Secondary | ICD-10-CM

## 2013-11-30 DIAGNOSIS — Z885 Allergy status to narcotic agent status: Secondary | ICD-10-CM | POA: Insufficient documentation

## 2013-11-30 DIAGNOSIS — R0602 Shortness of breath: Secondary | ICD-10-CM | POA: Insufficient documentation

## 2013-11-30 DIAGNOSIS — R0609 Other forms of dyspnea: Secondary | ICD-10-CM | POA: Insufficient documentation

## 2013-11-30 DIAGNOSIS — I4891 Unspecified atrial fibrillation: Secondary | ICD-10-CM | POA: Insufficient documentation

## 2013-11-30 DIAGNOSIS — Z9861 Coronary angioplasty status: Secondary | ICD-10-CM | POA: Diagnosis not present

## 2013-11-30 DIAGNOSIS — R5381 Other malaise: Secondary | ICD-10-CM | POA: Insufficient documentation

## 2013-11-30 DIAGNOSIS — N189 Chronic kidney disease, unspecified: Secondary | ICD-10-CM | POA: Diagnosis not present

## 2013-11-30 DIAGNOSIS — I739 Peripheral vascular disease, unspecified: Secondary | ICD-10-CM | POA: Diagnosis not present

## 2013-11-30 DIAGNOSIS — I472 Ventricular tachycardia, unspecified: Secondary | ICD-10-CM | POA: Insufficient documentation

## 2013-11-30 DIAGNOSIS — I4729 Other ventricular tachycardia: Secondary | ICD-10-CM | POA: Diagnosis not present

## 2013-11-30 DIAGNOSIS — I509 Heart failure, unspecified: Secondary | ICD-10-CM | POA: Insufficient documentation

## 2013-11-30 DIAGNOSIS — I4892 Unspecified atrial flutter: Secondary | ICD-10-CM | POA: Insufficient documentation

## 2013-11-30 DIAGNOSIS — I252 Old myocardial infarction: Secondary | ICD-10-CM | POA: Insufficient documentation

## 2013-11-30 DIAGNOSIS — M129 Arthropathy, unspecified: Secondary | ICD-10-CM | POA: Insufficient documentation

## 2013-11-30 DIAGNOSIS — I5023 Acute on chronic systolic (congestive) heart failure: Secondary | ICD-10-CM

## 2013-11-30 DIAGNOSIS — Z951 Presence of aortocoronary bypass graft: Secondary | ICD-10-CM | POA: Insufficient documentation

## 2013-11-30 DIAGNOSIS — J4489 Other specified chronic obstructive pulmonary disease: Secondary | ICD-10-CM | POA: Insufficient documentation

## 2013-11-30 DIAGNOSIS — Z950 Presence of cardiac pacemaker: Secondary | ICD-10-CM | POA: Insufficient documentation

## 2013-11-30 DIAGNOSIS — I5022 Chronic systolic (congestive) heart failure: Secondary | ICD-10-CM | POA: Insufficient documentation

## 2013-11-30 DIAGNOSIS — Z87891 Personal history of nicotine dependence: Secondary | ICD-10-CM | POA: Insufficient documentation

## 2013-11-30 DIAGNOSIS — I480 Paroxysmal atrial fibrillation: Secondary | ICD-10-CM

## 2013-11-30 DIAGNOSIS — D509 Iron deficiency anemia, unspecified: Secondary | ICD-10-CM | POA: Insufficient documentation

## 2013-11-30 DIAGNOSIS — Z888 Allergy status to other drugs, medicaments and biological substances status: Secondary | ICD-10-CM | POA: Diagnosis not present

## 2013-11-30 DIAGNOSIS — I2510 Atherosclerotic heart disease of native coronary artery without angina pectoris: Secondary | ICD-10-CM | POA: Diagnosis not present

## 2013-11-30 DIAGNOSIS — J449 Chronic obstructive pulmonary disease, unspecified: Secondary | ICD-10-CM | POA: Diagnosis not present

## 2013-11-30 DIAGNOSIS — R0989 Other specified symptoms and signs involving the circulatory and respiratory systems: Secondary | ICD-10-CM | POA: Insufficient documentation

## 2013-11-30 LAB — BASIC METABOLIC PANEL
Anion gap: 13 (ref 5–15)
BUN: 18 mg/dL (ref 6–23)
CALCIUM: 8.9 mg/dL (ref 8.4–10.5)
CO2: 22 mEq/L (ref 19–32)
Chloride: 98 mEq/L (ref 96–112)
Creatinine, Ser: 1.28 mg/dL (ref 0.50–1.35)
GFR calc Af Amer: 59 mL/min — ABNORMAL LOW (ref >90)
GFR calc non Af Amer: 51 mL/min — ABNORMAL LOW (ref >90)
GLUCOSE: 92 mg/dL (ref 70–99)
Potassium: 4.7 mEq/L (ref 3.7–5.3)
Sodium: 133 mEq/L — ABNORMAL LOW (ref 137–147)

## 2013-11-30 LAB — T3, FREE: T3, Free: 3.2 pg/mL (ref 2.3–4.2)

## 2013-11-30 LAB — T4, FREE: Free T4: 1.34 ng/dL (ref 0.80–1.80)

## 2013-11-30 LAB — TSH: TSH: 5.8 u[IU]/mL — AB (ref 0.350–4.500)

## 2013-11-30 NOTE — Patient Instructions (Addendum)
Stop Potassium  Labs today  Your physician recommends that you schedule a follow-up appointment in: 6 weeks

## 2013-11-30 NOTE — Telephone Encounter (Signed)
Message copied by Cynithia Hakimi, Milagros Reap on Fri Nov 30, 2013  2:09 PM ------      Message from: Laurey Morale      Created: Fri Nov 30, 2013  1:35 PM       K is better.  TSH still high, make sure that he had free T3 and free T4 sent.  Call him with K results as they wanted to hear today. ------

## 2013-11-30 NOTE — Progress Notes (Signed)
Patient ID: Andrew [name], male   DOB: 06/12/1930, 80 y.o.   MRN: [id number]   Andrew [name] Date of Birth: 03/12/1931 Medical Record #[id number] Primary cardiologist: Hochrein/Klein PCP: Daub/Le  History of Present Illness: [name] is an 80 y/o male with a complex medical history which includes COPD (on oxygen at night), CAD with remote MIs and multiple stents, paroxysmal AF/AFL, chronic systolic HF EF 30-35% ( July 2015) was 15% in 2012. He is s/p Medtronic CRT-D. Referred by Dr. Conley Rolls for further management of his HF,  Other issues include HTN, HLD, CKD, iron deficiency anemia - receiving IV iron. Was admitted back earlier in 2014 with sepsis - had ampicillin sensitive enterococcal bacteremia. His TEE was negative for a vegetation on his device.   Was admitted at the end of March of 2015 with COPD exacerbation and urinary retention. Xarelto dose was cut back due to his kidney function - has now normalized and he was put back on regular dose. Was admitted in July with HF and AFL.   He is on Xarelto for AF. Previously on amio for a long time but stopped to elevation of LFTs.  Currently in NSR.   Referred by Dr. Conley Rolls for ongoing fatigue. At prior appointment, had severe DOE at 100 feet.  However, he did not appear volume overloaded. At that appointment, Coreg was decreased to 12.5 mg bid given concern that he was having low output symptoms . Today, he is doing much better.   More strength and stamina.  No dyspnea or fatigue walking on flat ground.  No chest pain.  Had one episode of left arm pain at night (prior anginal equivalent) but no exertional arm pain.  No orthopnea, PND, or bendopnea.  Weight is down 1 lb.  He gets tired if he carries a 15-20 lb load more than about 100 feet.   Optivol: Thoracic impedance stable and high, fluid index below threshold, no atrial fibrillation.   Labs (8/15): K 5.4, creatinine 1.4, TSH 5.07 (mild elevation), BNP 803  Current Outpatient Prescriptions  Medication  Sig Dispense Refill  . albuterol (PROVENTIL HFA;VENTOLIN HFA) 108 (90 BASE) MCG/ACT inhaler Inhale 1 puff into the lungs every 6 (six) hours as needed for wheezing or shortness of breath.      [name] albuterol (PROVENTIL) (5 MG/ML) 0.5% nebulizer solution Take 2.5 mg by nebulization every 6 (six) hours as needed for wheezing or shortness of breath.      . ALPRAZolam (XANAX) 0.25 MG tablet Take 0.25 mg by mouth daily as needed. For anxiety.      . benzonatate (TESSALON) 100 MG capsule Take 1 capsule (100 mg total) by mouth 2 (two) times daily as needed for cough.  30 capsule  1  . carvedilol (COREG) 25 MG tablet Take 0.5 tablets (12.5 mg total) by mouth 2 (two) times daily with a meal.  15 tablet  6  . esomeprazole (NEXIUM) 40 MG capsule Take 40 mg by mouth 2 (two) times daily before a meal.      . fluticasone (FLONASE) 50 MCG/ACT nasal spray Place 2 sprays into both nostrils as needed for allergies.  16 g  11  . Fluticasone-Salmeterol (ADVAIR) 250-50 MCG/DOSE AEPB Inhale 1 puff into the lungs 2 (two) times daily.      . furosemide (LASIX) 80 MG tablet Take 80 mg by mouth daily. Can take additional 80 mg as needed      . HYDROcodone-acetaminophen (NORCO/VICODIN) 5-325 MG per tablet Take 1 tablet by  mouth every 6 (six) hours. Arthritis pain      . ipratropium (ATROVENT) 0.02 % nebulizer solution Take 2.5 mLs (500 mcg total) by nebulization 4 (four) times daily.  75 mL  12  . losartan (COZAAR) 50 MG tablet Take 50 mg by mouth 2 (two) times daily.      . meclizine (ANTIVERT) 25 MG tablet Take 12.5-25 mg by mouth 3 (three) times daily as needed for dizziness.      . nitroGLYCERIN (NITROSTAT) 0.4 MG SL tablet Place 0.4 mg under the tongue every 5 (five) minutes as needed for chest pain.      . NON FORMULARY Place 2 L into the nose daily as needed.       . potassium chloride (K-DUR,KLOR-CON) 10 MEQ tablet Take 10 mEq by mouth every other day.      . rivaroxaban (XARELTO) 20 MG TABS tablet Take 1 tablet (20 mg  total) by mouth daily with supper.  90 tablet  3  . Tamsulosin HCl (FLOMAX) 0.4 MG CAPS Take 0.4 mg by mouth 2 (two) times daily.       . [DISCONTINUED] metoprolol tartrate (LOPRESSOR) 25 MG tablet Take 1/2 tablet every other day   45 tablet  3   No current facility-administered medications for this encounter.    Allergies  Allergen Reactions  . Codeine Nausea And Vomiting  . Inspra [Eplerenone] Other (See Comments)    Stomach problems  . Spironolactone Other (See Comments)    Gynecomastia  . Doxycycline Itching and Rash  . Sulfonamide Derivatives Itching and Rash    Past Medical History  Diagnosis Date  . Chronic systolic heart failure     a. Chronic LV systolic failure. b. Echo in Dec 2011 showed EF of 25% - f/u echo 02/2011: Mild LVH, EF 15%, posterior lateral akinesis, inferior akinesis, grade 1 diastolic dysfunction, mild LAE. c. s/p BiV pacemaker 06/2011.  [name] Atrial flutter     a. Off amiodarone because of increased LFTs  . COPD (chronic obstructive pulmonary disease)     Chronic SOB, O2 dependent  . Myocardial infarction 1994  . Gastro - esophageal reflux   . Claudication   . BPH (benign prostatic hyperplasia)   . Coronary artery disease     a. Myocardial infarction in 1984,  99 and 07. b. H/o multiple RCA stents. c.  LHC 04/2011: without obstructive disease. oLM 25%, mLAD 30%, pCFX 25%, prox to mid RCA stent ok, dRCA 30% and 70%, pPDA 25%, mPL 25-30%, EF 25%.  [name] Hypertension   . Atrial fibrillation     a. On Xarelto.  . Hemorrhoids   . Hyperlipidemia     statin intolerant 2/2 myalgias  . Hx of adenomatous colonic polyps   . Hx of colonoscopy   . Chronic renal insufficiency   . Biventricular cardiac pacemaker in situ 3.2013  . NSVT (nonsustained ventricular tachycardia)     a. 04/2012- 6 beats on tele.  [name] Shortness of breath   . Arthritis     hips ,back ,hands  . Iron deficiency anemia, unspecified 03/20/2013    Past Surgical History  Procedure Laterality Date    . Cardiac catheterization  2008  . Cataract extraction    . Salivary gland surgery    . Coronary angioplasty with stent placement    . Insert / replace / remove pacemaker  06/16/2011  . Tee without cardioversion  05/08/2012    Procedure: TRANSESOPHAGEAL ECHOCARDIOGRAM (TEE);  Surgeon: Vesta Mixer, MD;  Location: [facility] ENDOSCOPY;  Service: Cardiovascular;  Laterality: N/A;    History  Smoking status  . Former Smoker -- 1.50 packs/day for 55 years  . Types: Cigarettes  . Start date: 05/21/1954  . Quit date: 01/03/2009  Smokeless tobacco  . Never Used    Comment: quit 4 years ago    History  Alcohol Use No    Comment: occasional    Family History  Problem Relation Age of Onset  . Coronary artery disease Father   . Heart disease Mother   . Colon cancer Neg Hx   . Heart disease Brother     Review of Systems: The review of systems is per the HPI.  All other systems were reviewed and are negative.  Physical Exam: BP 136/78  Pulse 70  Wt 177 lb 12 oz (80.627 kg)  SpO2 95% General:  Elderly HEENT: normal  Neck: supple. JVP 7. Carotids 2+ bilat; no bruits. No lymphadenopathy or thryomegaly appreciated. Cor: PMI nondisplaced. Regular rate & rhythm. No rubs, gallops or murmurs. Lungs: clear with decreased BS throughout Abdomen: soft, nontender, nondistended. No hepatosplenomegaly. No bruits or masses. Good bowel sounds. Extremities: no cyanosis, clubbing, rash.  Trace ankle edema with bilateral lower leg varicosities. Neuro: alert & orientedx3, cranial nerves grossly intact. moves all 4 extremities w/o difficulty. Affect pleasant   Wt Readings from Last 3 Encounters:  11/30/13 177 lb 12 oz (80.627 kg)  11/21/13 179 lb (81.194 kg)  11/20/13 181 lb (82.101 kg)    LABORATORY DATA/PROCEDURES: PENDING  Lab Results  Component Value Date   WBC 7.1 11/21/2013   HGB 12.3* 11/21/2013   HCT 36.5* 11/21/2013   PLT 247 11/21/2013   GLUCOSE 101* 11/15/2013   CHOL 184 05/02/2013    TRIG 243.0* 05/02/2013   HDL 29.50* 05/02/2013   LDLDIRECT 119.4 05/02/2013   LDLCALC 83 03/17/2012   ALT <8 08/13/2013   AST 14 08/13/2013   NA 133* 11/15/2013   K 5.4* 11/15/2013   CL 96 11/15/2013   CREATININE 1.40* 11/15/2013   BUN 23 11/15/2013   CO2 26 11/15/2013   TSH 5.070* 11/15/2013   INR 1.77* 05/03/2012   HGBA1C 6.0* 03/10/2011    BNP (last 3 results)  Recent Labs  06/25/13 1714 10/10/13 1507 10/22/13 1845  PROBNP 6196.0* 1021.0* 6264.0*   Assessment / Plan: 1. Chronic systolic CHF: EF 16-10% on last echo, ischemic cardiomyopathy.  Currently much improved after lowering Coreg, NYHA class II symptoms.  Optivol confirms no significant volume overload.  - Continue lower Coreg dose.  - Continue current and Lasix at current doses.  - Stop KCl with recent high K readings.  Will repeat BMET today.  - Hold off on adding digoxin and doing RHC for now given considerable improvement.  We will see him back in 6 wks to see if improvement maintains.  2. COPD on nocturnal oxygen: I suspect that this is contributing to his exertional symptoms.  3. CAD s/p multiple PCIs: No evidence of ischemia (previous angina was arm pain). 4. History of atrial flutter/fib/VT/PVCs: Now in NSR. no longer on amiodarone. On xarelto for anti-coagulation.  GFR has been borderline for 20 mg Xarelto, will recheck today and may need to decrease dose to 15 mg daily if GFR<50.  No recent atrial fibrillation by Optivol check today.  5. Iron deficiency anemia - continues with IV iron infusions. 6. CKD: BMET today, as above.   Aiysha Jillson Gaylyn Cheers 11/30/2013

## 2013-11-30 NOTE — Telephone Encounter (Signed)
Pt aware and will stay off K for now, T4 and T3 were drawn and are still in process at this time

## 2013-12-03 ENCOUNTER — Encounter: Payer: Self-pay | Admitting: Physician Assistant

## 2013-12-03 ENCOUNTER — Ambulatory Visit (INDEPENDENT_AMBULATORY_CARE_PROVIDER_SITE_OTHER): Payer: Medicare Other | Admitting: Physician Assistant

## 2013-12-03 VITALS — BP 136/68 | HR 80 | Ht 68.0 in | Wt 178.8 lb

## 2013-12-03 DIAGNOSIS — E039 Hypothyroidism, unspecified: Secondary | ICD-10-CM | POA: Insufficient documentation

## 2013-12-03 DIAGNOSIS — I480 Paroxysmal atrial fibrillation: Secondary | ICD-10-CM

## 2013-12-03 DIAGNOSIS — I509 Heart failure, unspecified: Secondary | ICD-10-CM

## 2013-12-03 DIAGNOSIS — I4891 Unspecified atrial fibrillation: Secondary | ICD-10-CM

## 2013-12-03 DIAGNOSIS — I2510 Atherosclerotic heart disease of native coronary artery without angina pectoris: Secondary | ICD-10-CM

## 2013-12-03 DIAGNOSIS — I5023 Acute on chronic systolic (congestive) heart failure: Secondary | ICD-10-CM

## 2013-12-03 NOTE — Progress Notes (Signed)
HPI: This is an 80 year old male patient of Dr. Antoine Poche and Dr. Graciela Husbands who was seen by Dr. Jearld Pies in the heart failure clinic on Friday. He has a complex medical history including COPD on home oxygen, CAD with remote MIs and multiple stents, paroxysmal atrial fibrillation and flutter on Xarelto, chronic systolic heart failure EF 30-35% in July 2015 was 15% in 2012. He is status post Medtronic CRT-D. He also has hypertension, hyperlipidemia and chronic kidney disease with iron deficiency anemia receiving iron.  Patient's heart failure had improved since lowering Coreg he was NYHA class II symptoms. Optical confirms no significant volume overload. Potassium was stopped because of recent high K. Readings. TSH was high at 5.8, but Free T4 and T3 were normal.  He has had no change in symptoms since he was seen by Dr. Shirlee Latch on Friday. He denies any chest pain, palpitations, dizziness or presyncope. He has chronic dyspnea and dyspnea on exertion. He usually uses oxygen at night but occasionally has to use it during the day.  Allergies  Allergen Reactions  . Codeine Nausea And Vomiting  . Inspra [Eplerenone] Other (See Comments)    Stomach problems  . Spironolactone Other (See Comments)    Gynecomastia  . Doxycycline Itching and Rash  . Sulfonamide Derivatives Itching and Rash     Current Outpatient Prescriptions  Medication Sig Dispense Refill  . albuterol (PROVENTIL HFA;VENTOLIN HFA) 108 (90 BASE) MCG/ACT inhaler Inhale 1 puff into the lungs every 6 (six) hours as needed for wheezing or shortness of breath.      [name] albuterol (PROVENTIL) (5 MG/ML) 0.5% nebulizer solution Take 2.5 mg by nebulization every 6 (six) hours as needed for wheezing or shortness of breath.      . ALPRAZolam (XANAX) 0.25 MG tablet Take 0.25 mg by mouth daily as needed. For anxiety.      . benzonatate (TESSALON) 100 MG capsule Take 1 capsule (100 mg total) by mouth 2 (two) times daily as needed for cough.  30  capsule  1  . carvedilol (COREG) 25 MG tablet Take 0.5 tablets (12.5 mg total) by mouth 2 (two) times daily with a meal.  15 tablet  6  . esomeprazole (NEXIUM) 40 MG capsule Take 40 mg by mouth 2 (two) times daily before a meal.      . fluticasone (FLONASE) 50 MCG/ACT nasal spray Place 2 sprays into both nostrils as needed for allergies.  16 g  11  . Fluticasone-Salmeterol (ADVAIR) 250-50 MCG/DOSE AEPB Inhale 1 puff into the lungs 2 (two) times daily.      . furosemide (LASIX) 80 MG tablet Take 80 mg by mouth daily. Can take additional 80 mg as needed      . HYDROcodone-acetaminophen (NORCO/VICODIN) 5-325 MG per tablet Take 1 tablet by mouth every 6 (six) hours. Arthritis pain      . ipratropium (ATROVENT) 0.02 % nebulizer solution Take 2.5 mLs (500 mcg total) by nebulization 4 (four) times daily.  75 mL  12  . losartan (COZAAR) 50 MG tablet Take 50 mg by mouth 2 (two) times daily.      . meclizine (ANTIVERT) 25 MG tablet Take 12.5-25 mg by mouth 3 (three) times daily as needed for dizziness.      . nitroGLYCERIN (NITROSTAT) 0.4 MG SL tablet Place 0.4 mg under the tongue every 5 (five) minutes as needed for chest pain.      . NON FORMULARY Place 2 L into the nose daily as  needed.       . rivaroxaban (XARELTO) 20 MG TABS tablet Take 1 tablet (20 mg total) by mouth daily with supper.  90 tablet  3  . Tamsulosin HCl (FLOMAX) 0.4 MG CAPS Take 0.4 mg by mouth 2 (two) times daily.       . [DISCONTINUED] metoprolol tartrate (LOPRESSOR) 25 MG tablet Take 1/2 tablet every other day   45 tablet  3   No current facility-administered medications for this visit.    Past Medical History  Diagnosis Date  . Chronic systolic heart failure     a. Chronic LV systolic failure. b. Echo in Dec 2011 showed EF of 25% - f/u echo 02/2011: Mild LVH, EF 15%, posterior lateral akinesis, inferior akinesis, grade 1 diastolic dysfunction, mild LAE. c. s/p BiV pacemaker 06/2011.  [name] Atrial flutter     a. Off amiodarone  because of increased LFTs  . COPD (chronic obstructive pulmonary disease)     Chronic SOB, O2 dependent  . Myocardial infarction 1994  . Gastro - esophageal reflux   . Claudication   . BPH (benign prostatic hyperplasia)   . Coronary artery disease     a. Myocardial infarction in 1984,  99 and 07. b. H/o multiple RCA stents. c.  LHC 04/2011: without obstructive disease. oLM 25%, mLAD 30%, pCFX 25%, prox to mid RCA stent ok, dRCA 30% and 70%, pPDA 25%, mPL 25-30%, EF 25%.  [name] Hypertension   . Atrial fibrillation     a. On Xarelto.  . Hemorrhoids   . Hyperlipidemia     statin intolerant 2/2 myalgias  . Hx of adenomatous colonic polyps   . Hx of colonoscopy   . Chronic renal insufficiency   . Biventricular cardiac pacemaker in situ 3.2013  . NSVT (nonsustained ventricular tachycardia)     a. 04/2012- 6 beats on tele.  [name] Shortness of breath   . Arthritis     hips ,back ,hands  . Iron deficiency anemia, unspecified 03/20/2013    Past Surgical History  Procedure Laterality Date  . Cardiac catheterization  2008  . Cataract extraction    . Salivary gland surgery    . Coronary angioplasty with stent placement    . Insert / replace / remove pacemaker  06/16/2011  . Tee without cardioversion  05/08/2012    Procedure: TRANSESOPHAGEAL ECHOCARDIOGRAM (TEE);  Surgeon: Vesta Mixer, MD;  Location: [facility] ENDOSCOPY;  Service: Cardiovascular;  Laterality: N/A;    Family History  Problem Relation Age of Onset  . Coronary artery disease Father   . Heart disease Mother   . Colon cancer Neg Hx   . Heart disease Brother     History   Social History  . Marital Status: Married    Spouse Name: N/A    Number of Children: N/A  . Years of Education: N/A   Occupational History  . retired Designer, industrial/product work    Social History Main Topics  . Smoking status: Former Smoker -- 1.50 packs/day for 55 years    Types: Cigarettes    Start date: 05/21/1954    Quit date: 01/03/2009  . Smokeless tobacco:  Never Used     Comment: quit 4 years ago  . Alcohol Use: No     Comment: occasional  . Drug Use: No  . Sexual Activity: Not Currently   Other Topics Concern  . Not on file   Social History Narrative  . No narrative on file    ROS: See history of  present illness otherwise negative  BP 136/68  Pulse 80  Ht  (1.727 m)  Wt 178 lb 12.8 oz (81.103 kg)  BMI 27.19 kg/m2  PHYSICAL EXAM: Well-nournished, in no acute distress. Neck: No JVD, HJR, Bruit, or thyroid enlargement  Lungs: Decreased breath sounds with scattered wheezing and rhonchi Cardiovascular: RRR, PMI not displaced, heart sounds distant, no murmurs, gallops, bruit, thrill, or heave.  Abdomen: BS normal. Soft without organomegaly, masses, lesions or tenderness.  Extremities: without cyanosis, clubbing or edema. Good distal pulses bilateral  SKin: Warm, no lesions or rashes   Musculoskeletal: No deformities  Neuro: no focal signs   Wt Readings from Last 3 Encounters:  11/30/13 177 lb 12 oz (80.627 kg)  11/21/13 179 lb (81.194 kg)  11/20/13 181 lb (82.101 kg)

## 2013-12-03 NOTE — Assessment & Plan Note (Signed)
No complaints of chest pain

## 2013-12-03 NOTE — Assessment & Plan Note (Signed)
Heart failure is well compensated. Continue Coreg and Lasix at current dose. Patient has followup in the heart failure clinic in 6 weeks.

## 2013-12-03 NOTE — Patient Instructions (Signed)
Your physician recommends that you continue on your current medications as directed. Please refer to the Current Medication list given to you today.   Your physician recommends that you schedule a follow-up appointment in:  [facility] IN 3 TO 4 MONTHS

## 2013-12-03 NOTE — Assessment & Plan Note (Signed)
Patient's TSH has been elevated on 2 occasions most recently at 5.8. Free T4 and T3 were normal. Recommend followup with Dr. Cleta Alberts for further treatment.

## 2013-12-03 NOTE — Assessment & Plan Note (Signed)
Patient is on Xarelto 

## 2013-12-05 ENCOUNTER — Other Ambulatory Visit: Payer: Self-pay | Admitting: Cardiology

## 2013-12-05 ENCOUNTER — Encounter: Payer: Self-pay | Admitting: Cardiology

## 2013-12-24 ENCOUNTER — Encounter: Payer: Self-pay | Admitting: Internal Medicine

## 2013-12-24 ENCOUNTER — Ambulatory Visit (INDEPENDENT_AMBULATORY_CARE_PROVIDER_SITE_OTHER): Payer: Medicare Other | Admitting: *Deleted

## 2013-12-24 DIAGNOSIS — I472 Ventricular tachycardia: Secondary | ICD-10-CM

## 2013-12-24 DIAGNOSIS — I4729 Other ventricular tachycardia: Secondary | ICD-10-CM

## 2013-12-24 LAB — MDC_IDC_ENUM_SESS_TYPE_REMOTE
Battery Remaining Longevity: 59 mo
Battery Voltage: 3.01 V
Brady Statistic AS VP Percent: 80.78 %
Brady Statistic AS VS Percent: 6.14 %
Brady Statistic RA Percent Paced: 13.07 %
Brady Statistic RV Percent Paced: 93.84 %
Lead Channel Impedance Value: 1254 Ohm
Lead Channel Impedance Value: 361 Ohm
Lead Channel Impedance Value: 399 Ohm
Lead Channel Impedance Value: 456 Ohm
Lead Channel Impedance Value: 570 Ohm
Lead Channel Impedance Value: 988 Ohm
Lead Channel Pacing Threshold Amplitude: 0.75 V
Lead Channel Pacing Threshold Pulse Width: 0.4 ms
Lead Channel Pacing Threshold Pulse Width: 0.4 ms
Lead Channel Sensing Intrinsic Amplitude: 31.625 mV
Lead Channel Sensing Intrinsic Amplitude: 31.625 mV
Lead Channel Setting Pacing Amplitude: 2 V
Lead Channel Setting Pacing Amplitude: 2 V
Lead Channel Setting Pacing Amplitude: 2.5 V
Lead Channel Setting Pacing Pulse Width: 0.4 ms
Lead Channel Setting Pacing Pulse Width: 0.4 ms
Lead Channel Setting Sensing Sensitivity: 0.9 mV
MDC IDC MSMT LEADCHNL LV IMPEDANCE VALUE: 684 Ohm
MDC IDC MSMT LEADCHNL LV IMPEDANCE VALUE: 855 Ohm
MDC IDC MSMT LEADCHNL RA IMPEDANCE VALUE: 494 Ohm
MDC IDC MSMT LEADCHNL RA SENSING INTR AMPL: 2.5 mV
MDC IDC MSMT LEADCHNL RA SENSING INTR AMPL: 2.5 mV
MDC IDC MSMT LEADCHNL RV PACING THRESHOLD AMPLITUDE: 0.625 V
MDC IDC SESS DTM: 20150921143747
MDC IDC STAT BRADY AP VP PERCENT: 13.06 %
MDC IDC STAT BRADY AP VS PERCENT: 0.01 %
Zone Setting Detection Interval: 350 ms
Zone Setting Detection Interval: 400 ms

## 2013-12-24 NOTE — Progress Notes (Signed)
Remote pacemaker transmission.   

## 2013-12-25 ENCOUNTER — Other Ambulatory Visit: Payer: Self-pay | Admitting: Dermatology

## 2013-12-27 ENCOUNTER — Encounter (INDEPENDENT_AMBULATORY_CARE_PROVIDER_SITE_OTHER): Payer: Medicare Other | Admitting: Family Medicine

## 2013-12-28 ENCOUNTER — Ambulatory Visit (INDEPENDENT_AMBULATORY_CARE_PROVIDER_SITE_OTHER): Payer: Medicare Other | Admitting: Family Medicine

## 2013-12-28 VITALS — BP 130/60 | HR 69 | Temp 97.4°F | Resp 16 | Ht 69.5 in | Wt 179.0 lb

## 2013-12-28 DIAGNOSIS — Z23 Encounter for immunization: Secondary | ICD-10-CM

## 2013-12-28 DIAGNOSIS — R6889 Other general symptoms and signs: Secondary | ICD-10-CM

## 2013-12-28 DIAGNOSIS — R899 Unspecified abnormal finding in specimens from other organs, systems and tissues: Secondary | ICD-10-CM

## 2013-12-28 DIAGNOSIS — R946 Abnormal results of thyroid function studies: Secondary | ICD-10-CM

## 2013-12-28 DIAGNOSIS — R7989 Other specified abnormal findings of blood chemistry: Secondary | ICD-10-CM

## 2013-12-28 NOTE — Patient Instructions (Addendum)
You should hear from me next week regarding the lab. If you do not hear about mid week call to see if I have a report on that.  Return at any time if problems

## 2013-12-28 NOTE — Progress Notes (Signed)
Subjective: 80 year old man who is here for thyroid check. He had some lab work done a month ago and his TSH was elevated. Over the last 4 years he has had it checked several times and is always been good. He had some tissues this summer with heart rhythm disturbances and COPD exacerbation. He just gradually getting back his strength. He is fairly sedentary still. He does some work around the house and yard, but has not been able to keep up his yard like usual. He does not have any major other specific complaints. He said last month he is having a lot of PVCs, but they seemed to be better now.  Objective: Pleasant alert gentleman, fully oriented and able to give a good history. His neck was supple with no thyromegaly. Chest clear to auscultation. Heart regular with no ectopy noted right now.  Assessment: Abnormal TSH, rule out hypothyroidism History of arrhythmias, stable Desires flu shot  Plan: Check TSH Get flu shot  If TSH is abnormal, then will need to be treated. If it is improving we would just follow it still for a while. This seemed to be a fairly transient elevation in less his gland is just recently burned out.

## 2013-12-29 LAB — TSH: TSH: 3.927 u[IU]/mL (ref 0.350–4.500)

## 2014-01-07 ENCOUNTER — Encounter: Payer: Self-pay | Admitting: Cardiology

## 2014-01-11 ENCOUNTER — Ambulatory Visit (HOSPITAL_COMMUNITY)
Admission: RE | Admit: 2014-01-11 | Discharge: 2014-01-11 | Disposition: A | Discharge status: Home / Self Care | Payer: Medicare Other | Source: Ambulatory Visit | Attending: Cardiology | Admitting: Cardiology

## 2014-01-11 ENCOUNTER — Telehealth (HOSPITAL_COMMUNITY): Payer: Self-pay | Admitting: Cardiology

## 2014-01-11 ENCOUNTER — Encounter (HOSPITAL_COMMUNITY): Payer: Self-pay

## 2014-01-11 VITALS — BP 142/60 | HR 72 | Wt 182.0 lb

## 2014-01-11 DIAGNOSIS — Z885 Allergy status to narcotic agent status: Secondary | ICD-10-CM | POA: Insufficient documentation

## 2014-01-11 DIAGNOSIS — I252 Old myocardial infarction: Secondary | ICD-10-CM | POA: Diagnosis not present

## 2014-01-11 DIAGNOSIS — N183 Chronic kidney disease, stage 3 unspecified: Secondary | ICD-10-CM

## 2014-01-11 DIAGNOSIS — I480 Paroxysmal atrial fibrillation: Secondary | ICD-10-CM

## 2014-01-11 DIAGNOSIS — I129 Hypertensive chronic kidney disease with stage 1 through stage 4 chronic kidney disease, or unspecified chronic kidney disease: Secondary | ICD-10-CM | POA: Diagnosis not present

## 2014-01-11 DIAGNOSIS — N189 Chronic kidney disease, unspecified: Secondary | ICD-10-CM | POA: Diagnosis not present

## 2014-01-11 DIAGNOSIS — Z87891 Personal history of nicotine dependence: Secondary | ICD-10-CM | POA: Diagnosis not present

## 2014-01-11 DIAGNOSIS — K219 Gastro-esophageal reflux disease without esophagitis: Secondary | ICD-10-CM | POA: Insufficient documentation

## 2014-01-11 DIAGNOSIS — I5022 Chronic systolic (congestive) heart failure: Secondary | ICD-10-CM | POA: Insufficient documentation

## 2014-01-11 DIAGNOSIS — Z882 Allergy status to sulfonamides status: Secondary | ICD-10-CM | POA: Insufficient documentation

## 2014-01-11 DIAGNOSIS — Z950 Presence of cardiac pacemaker: Secondary | ICD-10-CM | POA: Insufficient documentation

## 2014-01-11 DIAGNOSIS — Z9981 Dependence on supplemental oxygen: Secondary | ICD-10-CM | POA: Diagnosis not present

## 2014-01-11 DIAGNOSIS — J449 Chronic obstructive pulmonary disease, unspecified: Secondary | ICD-10-CM | POA: Diagnosis not present

## 2014-01-11 DIAGNOSIS — Z79899 Other long term (current) drug therapy: Secondary | ICD-10-CM | POA: Insufficient documentation

## 2014-01-11 DIAGNOSIS — Z881 Allergy status to other antibiotic agents status: Secondary | ICD-10-CM | POA: Insufficient documentation

## 2014-01-11 DIAGNOSIS — D509 Iron deficiency anemia, unspecified: Secondary | ICD-10-CM | POA: Insufficient documentation

## 2014-01-11 DIAGNOSIS — I2510 Atherosclerotic heart disease of native coronary artery without angina pectoris: Secondary | ICD-10-CM | POA: Insufficient documentation

## 2014-01-11 MED ORDER — FUROSEMIDE 80 MG PO TABS: 80.0000 mg | ORAL_TABLET | Freq: Every day | ORAL | Status: DC

## 2014-01-11 MED ORDER — SPIRONOLACTONE 25 MG PO TABS: 12.5000 mg | ORAL_TABLET | Freq: Every day | ORAL | Status: DC

## 2014-01-11 MED ORDER — EPLERENONE 25 MG PO TABS: 25.0000 mg | ORAL_TABLET | Freq: Every day | ORAL | Status: DC

## 2014-01-11 NOTE — Telephone Encounter (Signed)
Pts called to report allergy to spironolactone- pt has taken this in the past and caused gynecomastia in the past Pt is unable to inspra  as it causes stomach pains per vo Dr. Shirlee LatchMclean  D/c spironolactone and do not add inspra either   pts wife aware and voiced understanding

## 2014-01-11 NOTE — Patient Instructions (Addendum)
START Spironolactone 12.5mg  daily CHANGE Lasix to 80 mg daily, with 40 mg every other day Labs needed in 10 days BMET (01/22/2014)  Please continue to follow a low salt and low potassium diet.  Your physician recommends that you schedule a follow-up appointment in: 1 month  Do the following things EVERYDAY: 1) Weigh yourself in the morning before breakfast. Write it down and keep it in a log. 2) Take your medicines as prescribed 3) Eat low salt foods-Limit salt (sodium) to 2000 mg per day.  4) Stay as active as you can everyday 5) Limit all fluids for the day to less than 2 liters.

## 2014-01-13 DIAGNOSIS — N183 Chronic kidney disease, stage 3 unspecified: Secondary | ICD-10-CM | POA: Insufficient documentation

## 2014-01-13 NOTE — Addendum Note (Signed)
Encounter addended by: Laurey Moralealton S Amellia Panik, MD on: 01/13/2014  2:37 PM<BR>     Documentation filed: Problem List, Visit Diagnoses, Notes Section, Follow-up Section, LOS Section, Clinical Notes

## 2014-01-13 NOTE — Progress Notes (Addendum)
Patient ID: Andrew [name], male   DOB: 25-Jun-1930, 80 y.o.   MRN: [id number] Andrew [name] Date of Birth: 25-Jun-1930 Medical Record #[id number] Primary cardiologist: Hochrein/Klein PCP: Daub/Le  History of Present Illness: Andrew [name] is an 80 y/o male with a complex medical history which includes COPD (on oxygen at night), CAD with remote MIs and multiple stents, paroxysmal AF/AFL, chronic systolic HF EF 30-35% ( July 2015) was 15% in 2012. He is s/p [employer] CRT-D. Referred by Dr. Conley RollsLe for further management of his HF,  Other issues include HTN, HLD, CKD, iron deficiency anemia - receiving IV iron. Was admitted back earlier in 2014 with sepsis - had ampicillin sensitive enterococcal bacteremia. His TEE was negative for a vegetation on his device.   Was admitted at the end of March of 2015 with COPD exacerbation and urinary retention. Xarelto dose was cut back due to his kidney function - has now normalized and he was put back on regular dose. Was admitted in July with HF and AFL.   He is on Xarelto for AF. Previously on amio for a long time but stopped to elevation of LFTs.  Currently in NSR.   At an earlier appointment, Coreg decreased to 12.5 mg bid due to low output symptoms.  He has been feeling much better since then.   More strength and stamina.  However, weight is up today by 5 lbs.  He is short of breath if he "walks fast."  No lightheadedness or syncope.  No orthopnea/PND.  No chest pain.  He has been repairing his deck and tolerating lifting loads, etc.    He has not tolerated spironolactone or eplerenone in the past due to gynecomastia.  Optivol: Thoracic impedance falling, fluid index above threshold.   Labs (8/15): K 5.4, creatinine 1.4 => 1.28, TSH 5.07 (mild elevation), BNP 803 Labs (9/15): TSH normal  Current Outpatient Prescriptions  Medication Sig Dispense Refill  . albuterol (PROVENTIL HFA;VENTOLIN HFA) 108 (90 BASE) MCG/ACT inhaler Inhale 1 puff into the lungs every 6 (six)  hours as needed for wheezing or shortness of breath.      [name]. albuterol (PROVENTIL) (5 MG/ML) 0.5% nebulizer solution Take 2.5 mg by nebulization every 6 (six) hours as needed for wheezing or shortness of breath.      . ALPRAZolam (XANAX) 0.25 MG tablet Take 0.25 mg by mouth daily as needed. For anxiety.      . carvedilol (COREG) 25 MG tablet TAKE 1/2 TABLET TWICE A DAY WITH MEALS --12.5 mg twice daily      . esomeprazole (NEXIUM) 40 MG capsule Take 40 mg by mouth 2 (two) times daily before a meal.      . fluticasone (FLONASE) 50 MCG/ACT nasal spray Place 2 sprays into both nostrils as needed for allergies.  16 g  11  . Fluticasone-Salmeterol (ADVAIR) 250-50 MCG/DOSE AEPB Inhale 1 puff into the lungs 2 (two) times daily.      . furosemide (LASIX) 80 MG tablet Take 1 tablet (80 mg total) by mouth daily. Take 40 mg in the PM Every other day  45 tablet  3  . guaiFENesin (MUCINEX) 600 MG 12 hr tablet Take 600 mg by mouth 2 (two) times daily.      [name]. HYDROcodone-acetaminophen (NORCO/VICODIN) 5-325 MG per tablet Take 1 tablet by mouth every 6 (six) hours. Arthritis pain      . ipratropium (ATROVENT) 0.02 % nebulizer solution Take 2.5 mLs (500 mcg total) by nebulization 4 (four) times daily.  75 mL  12  . losartan (COZAAR) 50 MG tablet Take 50 mg by mouth 2 (two) times daily.      . nitroGLYCERIN (NITROSTAT) 0.4 MG SL tablet Place 0.4 mg under the tongue every 5 (five) minutes as needed for chest pain.      . NON FORMULARY Place 2 L into the nose daily as needed (Oxygen).       . rivaroxaban (XARELTO) 20 MG TABS tablet Take 1 tablet (20 mg total) by mouth daily with supper.  90 tablet  3  . Tamsulosin HCl (FLOMAX) 0.4 MG CAPS Take 0.4 mg by mouth 2 (two) times daily.       . benzonatate (TESSALON) 100 MG capsule Take 1 capsule (100 mg total) by mouth 2 (two) times daily as needed for cough.  30 capsule  1  . meclizine (ANTIVERT) 25 MG tablet Take 12.5-25 mg by mouth 3 (three) times daily as needed for  dizziness.      . [DISCONTINUED] metoprolol tartrate (LOPRESSOR) 25 MG tablet Take 1/2 tablet every other day   45 tablet  3   No current facility-administered medications for this encounter.    Allergies  Allergen Reactions  . Codeine Nausea And Vomiting  . Inspra [Eplerenone] Other (See Comments)    Stomach problems  . Spironolactone Other (See Comments)    Gynecomastia  . Doxycycline Itching and Rash  . Sulfonamide Derivatives Itching and Rash    Past Medical History  Diagnosis Date  . Chronic systolic heart failure     a. Chronic LV systolic failure. b. Echo in Dec 2011 showed EF of 25% - f/u echo 02/2011: Mild LVH, EF 15%, posterior lateral akinesis, inferior akinesis, grade 1 diastolic dysfunction, mild LAE. c. s/p BiV pacemaker 06/2011.  [name]. Atrial flutter     a. Off amiodarone because of increased LFTs  . COPD (chronic obstructive pulmonary disease)     Chronic SOB, O2 dependent  . Myocardial infarction 1994  . Gastro - esophageal reflux   . Claudication   . BPH (benign prostatic hyperplasia)   . Coronary artery disease     a. Myocardial infarction in 1984,  99 and 07. b. H/o multiple RCA stents. c.  LHC 04/2011: without obstructive disease. oLM 25%, mLAD 30%, pCFX 25%, prox to mid RCA stent ok, dRCA 30% and 70%, pPDA 25%, mPL 25-30%, EF 25%.  [name]. Hypertension   . Atrial fibrillation     a. On Xarelto.  . Hemorrhoids   . Hyperlipidemia     statin intolerant 2/2 myalgias  . Hx of adenomatous colonic polyps   . Hx of colonoscopy   . Chronic renal insufficiency   . Biventricular cardiac pacemaker in situ 3.2013  . NSVT (nonsustained ventricular tachycardia)     a. 04/2012- 6 beats on tele.  [name]. Shortness of breath   . Arthritis     hips ,back ,hands  . Iron deficiency anemia, unspecified 03/20/2013    Past Surgical History  Procedure Laterality Date  . Cardiac catheterization  2008  . Cataract extraction    . Salivary gland surgery    . Coronary angioplasty with  stent placement    . Insert / replace / remove pacemaker  06/16/2011  . Tee without cardioversion  05/08/2012    Procedure: TRANSESOPHAGEAL ECHOCARDIOGRAM (TEE);  Surgeon: Vesta MixerPhilip J Nahser, MD;  [facility];  Service: Cardiovascular;  Laterality: N/A;    History  Smoking status  . Former Smoker -- 1.50 packs/day for  55 years  . Types: Cigarettes  . Start date: 05/21/1954  . Quit date: 01/03/2009  Smokeless tobacco  . Never Used    Comment: quit 4 years ago    History  Alcohol Use No    Comment: occasional    Family History  Problem Relation Age of Onset  . Coronary artery disease Father   . Heart disease Mother   . Colon cancer Neg Hx   . Heart disease Brother     Review of Systems: The review of systems is per the HPI.  All other systems were reviewed and are negative.  Physical Exam: BP 142/60  Pulse 72  Wt 182 lb (82.555 kg)  SpO2 91% General:  Elderly HEENT: normal  Neck: supple. JVP 8-9 cm. Carotids 2+ bilat; no bruits. No lymphadenopathy or thryomegaly appreciated. Cor: PMI nondisplaced. Regular rate & rhythm. No rubs, gallops or murmurs. Lungs: clear with decreased BS throughout Abdomen: soft, nontender, nondistended. No hepatosplenomegaly. No bruits or masses. Good bowel sounds. Extremities: no cyanosis, clubbing, rash.  1+ ankle edema with bilateral lower leg varicosities. Neuro: alert & orientedx3, cranial nerves grossly intact. moves all 4 extremities w/o difficulty. Affect pleasant   Wt Readings from Last 3 Encounters:  01/11/14 182 lb (82.555 kg)  12/28/13 179 lb (81.194 kg)  12/27/13 180 lb (81.647 kg)    LABORATORY DATA/PROCEDURES: PENDING  Lab Results  Component Value Date   WBC 7.1 11/21/2013   HGB 12.3* 11/21/2013   HCT 36.5* 11/21/2013   PLT 247 11/21/2013   GLUCOSE 92 11/30/2013   CHOL 184 05/02/2013   TRIG 243.0* 05/02/2013   HDL 29.50* 05/02/2013   LDLDIRECT 119.4 05/02/2013   LDLCALC 83 03/17/2012   ALT <8 08/13/2013   AST 14  08/13/2013   NA 133* 11/30/2013   K 4.7 11/30/2013   CL 98 11/30/2013   CREATININE 1.28 11/30/2013   BUN 18 11/30/2013   CO2 22 11/30/2013   TSH 3.927 12/28/2013   INR 1.77* 05/03/2012   HGBA1C 6.0* 03/10/2011    BNP (last 3 results)  Recent Labs  06/25/13 1714 10/10/13 1507 10/22/13 1845  PROBNP 6196.0* 1021.0* 6264.0*   Assessment / Plan: 1. Chronic systolic CHF: EF 40-98% on last echo, ischemic cardiomyopathy.  Feeling pretty good with NYHA class II symptoms.  However, he does look like he has gained some volume, confirmed by Optivol interrogation today.  He is up 5 lbs.  - Continue current Coreg and losartan doses.  - Increase Lasix to 80 qam, 40 qpm x 3 days in a row, then Lasix 80 qam every day and 40 qpm every other day.  - Off KCl with recent high K readings.  Will repeat BMET in 10 days.  - No spironolactone due to gynecomastia.  He was tried on eplerenone but did not tolerate this either.  2. COPD on nocturnal oxygen: I suspect that this is contributing to his exertional symptoms.  3. CAD s/p multiple PCIs: No evidence of ischemia (previous angina was arm pain). 4. History of atrial flutter/fib/VT/PVCs: Now in NSR. no longer on amiodarone. On xarelto for anti-coagulation.  GFR has been borderline for 20 mg Xarelto, so far > 50.  No recent atrial fibrillation by Optivol check.  5. Iron deficiency anemia - continues with IV iron infusions. 6. CKD: BMET in 10 days, as above.   Trang Bouse,MD 01/13/2014

## 2014-01-16 ENCOUNTER — Ambulatory Visit: Payer: Medicare Other

## 2014-01-16 ENCOUNTER — Ambulatory Visit (HOSPITAL_BASED_OUTPATIENT_CLINIC_OR_DEPARTMENT_OTHER): Payer: Medicare Other | Admitting: Hematology & Oncology

## 2014-01-16 ENCOUNTER — Encounter: Payer: Self-pay | Admitting: Hematology & Oncology

## 2014-01-16 ENCOUNTER — Other Ambulatory Visit (HOSPITAL_BASED_OUTPATIENT_CLINIC_OR_DEPARTMENT_OTHER): Payer: Medicare Other | Admitting: Lab

## 2014-01-16 VITALS — BP 138/61 | HR 60 | Temp 97.6°F | Resp 20 | Ht 69.0 in | Wt 180.0 lb

## 2014-01-16 DIAGNOSIS — D509 Iron deficiency anemia, unspecified: Secondary | ICD-10-CM

## 2014-01-16 DIAGNOSIS — I509 Heart failure, unspecified: Secondary | ICD-10-CM

## 2014-01-16 LAB — CBC WITH DIFFERENTIAL (CANCER CENTER ONLY)
BASO#: 0 10*3/uL (ref 0.0–0.2)
BASO%: 0.3 % (ref 0.0–2.0)
EOS%: 2.1 % (ref 0.0–7.0)
Eosinophils Absolute: 0.1 10*3/uL (ref 0.0–0.5)
HCT: 39.3 % (ref 38.7–49.9)
HGB: 13.2 g/dL (ref 13.0–17.1)
LYMPH#: 0.9 10*3/uL (ref 0.9–3.3)
LYMPH%: 15 % (ref 14.0–48.0)
MCH: 32.8 pg (ref 28.0–33.4)
MCHC: 33.6 g/dL (ref 32.0–35.9)
MCV: 98 fL (ref 82–98)
MONO#: 0.6 10*3/uL (ref 0.1–0.9)
MONO%: 9.7 % (ref 0.0–13.0)
NEUT#: 4.6 10*3/uL (ref 1.5–6.5)
NEUT%: 72.9 % (ref 40.0–80.0)
PLATELETS: 210 10*3/uL (ref 145–400)
RBC: 4.03 10*6/uL — ABNORMAL LOW (ref 4.20–5.70)
RDW: 13.8 % (ref 11.1–15.7)
WBC: 6.3 10*3/uL (ref 4.0–10.0)

## 2014-01-16 LAB — RETICULOCYTES (CHCC)
ABS RETIC: 45.1 10*3/uL (ref 19.0–186.0)
RBC.: 4.1 MIL/uL — ABNORMAL LOW (ref 4.22–5.81)
Retic Ct Pct: 1.1 % (ref 0.4–2.3)

## 2014-01-16 NOTE — Progress Notes (Signed)
No treatment today per Dr Ennever 

## 2014-01-17 LAB — FERRITIN CHCC: FERRITIN: 405 ng/mL — AB (ref 22–316)

## 2014-01-17 LAB — IRON AND TIBC CHCC
%SAT: 34 % (ref 20–55)
IRON: 52 ug/dL (ref 42–163)
TIBC: 153 ug/dL — ABNORMAL LOW (ref 202–409)
UIBC: 100 ug/dL — ABNORMAL LOW (ref 117–376)

## 2014-01-17 NOTE — Progress Notes (Signed)
Hematology and Oncology Follow Up Visit  Andrew [name] [id number] 1930/04/17 80 y.o. 01/17/2014   Principle Diagnosis:    Iron deficiency anemia  Erythropoitin deficiency  Congestive heart failure  Current Therapy:    IV iron as indicated     Interim History:  Andrew [name] is back for followup. Last saw him in August. He really looks quite good. This probably is the best I have seen them look in her a few months.  He's had no problems with heart failure. He sees the cardiologist for this. He does have a pacemaker/defibrillator implanted.   He fell about 6 weeks ago. He sustained a gash in the top of his head. He required sutures for this. This has healed up quite nicely.  When I last saw him, his ferritin was 708. His iron saturation was 30%. Total iron was 55.  He has responded very nicely to iron.  He has had no problems with bleeding. His appetite has been doing pretty well.  He is on Xarelto. Medications: Current outpatient prescriptions:albuterol (PROVENTIL HFA;VENTOLIN HFA) 108 (90 BASE) MCG/ACT inhaler, Inhale 1 puff into the lungs every 6 (six) hours as needed for wheezing or shortness of breath., Disp: , Rfl: ;  albuterol (PROVENTIL) (5 MG/ML) 0.5% nebulizer solution, Take 2.5 mg by nebulization every 6 (six) hours as needed for wheezing or shortness of breath., Disp: , Rfl:  ALPRAZolam (XANAX) 0.25 MG tablet, Take 0.25 mg by mouth daily as needed. For anxiety., Disp: , Rfl: ;  benzonatate (TESSALON) 100 MG capsule, Take 1 capsule (100 mg total) by mouth 2 (two) times daily as needed for cough., Disp: 30 capsule, Rfl: 1;  carvedilol (COREG) 25 MG tablet, TAKE 1/2 TABLET TWICE A DAY WITH MEALS --12.5 mg twice daily, Disp: , Rfl:  esomeprazole (NEXIUM) 40 MG capsule, Take 40 mg by mouth 2 (two) times daily before a meal., Disp: , Rfl: ;  fluticasone (FLONASE) 50 MCG/ACT nasal spray, Place 2 sprays into both nostrils as needed for allergies., Disp: 16 g, Rfl: 11;   Fluticasone-Salmeterol (ADVAIR) 250-50 MCG/DOSE AEPB, Inhale 1 puff into the lungs 2 (two) times daily., Disp: , Rfl:  furosemide (LASIX) 80 MG tablet, Take 1 tablet (80 mg total) by mouth daily. Take 40 mg in the PM Every other day, Disp: 45 tablet, Rfl: 3;  guaiFENesin (MUCINEX) 600 MG 12 hr tablet, Take 600 mg by mouth 2 (two) times daily., Disp: , Rfl: ;  HYDROcodone-acetaminophen (NORCO/VICODIN) 5-325 MG per tablet, Take 1 tablet by mouth every 6 (six) hours. Arthritis pain, Disp: , Rfl:  ipratropium (ATROVENT) 0.02 % nebulizer solution, Take 2.5 mLs (500 mcg total) by nebulization 4 (four) times daily., Disp: 75 mL, Rfl: 12;  losartan (COZAAR) 50 MG tablet, Take 50 mg by mouth 2 (two) times daily., Disp: , Rfl: ;  meclizine (ANTIVERT) 25 MG tablet, Take 12.5-25 mg by mouth 3 (three) times daily as needed for dizziness., Disp: , Rfl:  nitroGLYCERIN (NITROSTAT) 0.4 MG SL tablet, Place 0.4 mg under the tongue every 5 (five) minutes as needed for chest pain., Disp: , Rfl: ;  NON FORMULARY, Place 2 L into the nose daily as needed (Oxygen). , Disp: , Rfl: ;  rivaroxaban (XARELTO) 20 MG TABS tablet, Take 1 tablet (20 mg total) by mouth daily with supper., Disp: 90 tablet, Rfl: 3;  Tamsulosin HCl (FLOMAX) 0.4 MG CAPS, Take 0.4 mg by mouth 2 (two) times daily. , Disp: , Rfl:  [DISCONTINUED] metoprolol tartrate (LOPRESSOR) 25  MG tablet, Take 1/2 tablet every other day , Disp: 45 tablet, Rfl: 3  Allergies:  Allergies  Allergen Reactions  . Codeine Nausea And Vomiting  . Inspra [Eplerenone] Other (See Comments)    Stomach problems  . Spironolactone Other (See Comments)    Gynecomastia  . Doxycycline Itching and Rash  . Sulfonamide Derivatives Itching and Rash    Past Medical History, Surgical history, Social history, and Family History were reviewed and updated.  Review of Systems: As above  Physical Exam:  height is 5\' 9"  (1.753 m) and weight is 180 lb (81.647 kg). His oral temperature is 97.6 F  (36.4 C). His blood pressure is 138/61 and his pulse is 60. His respiration is 20 and oxygen saturation is 97%.   Elderly white abdomen. Head and neck exam shows no ocular or oral lesions. He has no adenopathy in the neck. Lungs are with slight decrease at the bases. Cardiac exam regular rate and rhythm with no murmurs rubs or bruits. Abdomen soft. Has good bowel sounds. There is no fluid wave. There is no palpable liver or spleen tip. Extremities show some chronic mild nonpitting edema of his legs. He has some stasis dermatitis changes in his legs. He has some age related arthritic changes. He has a decent strength. Neurological exam is nonfocal.  Lab Results  Component Value Date   WBC 6.3 01/16/2014   HGB 13.2 01/16/2014   HCT 39.3 01/16/2014   MCV 98 01/16/2014   PLT 210 01/16/2014     Chemistry      Component Value Date/Time   NA 133* 11/30/2013 1212   NA 136 01/17/2013 1337   K 4.7 11/30/2013 1212   K 4.5 01/17/2013 1337   CL 98 11/30/2013 1212   CL 97* 01/17/2013 1337   CO2 22 11/30/2013 1212   CO2 31 01/17/2013 1337   BUN 18 11/30/2013 1212   BUN 18 01/17/2013 1337   CREATININE 1.28 11/30/2013 1212   CREATININE 1.43* 11/12/2013 1019      Component Value Date/Time   CALCIUM 8.9 11/30/2013 1212   CALCIUM 8.9 01/17/2013 1337   ALKPHOS 70 08/13/2013 1149   AST 14 08/13/2013 1149   ALT <8 08/13/2013 1149   BILITOT 0.6 08/13/2013 1149         Impression and Plan: [name] is 80 year old white male. He has congestive heart failure. He has a history of iron deficiency. Again, the iron is helping him quite a. He last got iron back in early July. At that point on, his iron saturation was only 18. Again, he responds very well.  We will see what his iron levels are. Quality with his hemoglobin being so good, that his iron should be okay.  With his MCV being 98, one would think that his iron levels should be okay.  I think we can probably get him back in another 3  months.   Josph MachoENNEVER,Geovana Gebel R, MD 10/15/20157:08 AM

## 2014-01-18 ENCOUNTER — Encounter: Payer: Self-pay | Admitting: *Deleted

## 2014-01-22 ENCOUNTER — Ambulatory Visit (HOSPITAL_COMMUNITY)
Admission: RE | Admit: 2014-01-22 | Discharge: 2014-01-22 | Disposition: A | Discharge status: Home / Self Care | Payer: Medicare Other | Source: Ambulatory Visit | Attending: Cardiology | Admitting: Cardiology

## 2014-01-22 DIAGNOSIS — I5022 Chronic systolic (congestive) heart failure: Secondary | ICD-10-CM

## 2014-01-22 DIAGNOSIS — I5023 Acute on chronic systolic (congestive) heart failure: Secondary | ICD-10-CM | POA: Diagnosis not present

## 2014-01-22 LAB — BASIC METABOLIC PANEL
ANION GAP: 10 (ref 5–15)
BUN: 15 mg/dL (ref 6–23)
CALCIUM: 8.9 mg/dL (ref 8.4–10.5)
CO2: 29 meq/L (ref 19–32)
Chloride: 100 mEq/L (ref 96–112)
Creatinine, Ser: 1.26 mg/dL (ref 0.50–1.35)
GFR calc non Af Amer: 52 mL/min — ABNORMAL LOW (ref >90)
GFR, EST AFRICAN AMERICAN: 60 mL/min — AB (ref >90)
Glucose, Bld: 118 mg/dL — ABNORMAL HIGH (ref 70–99)
Potassium: 3.7 mEq/L (ref 3.7–5.3)
SODIUM: 139 meq/L (ref 137–147)

## 2014-01-28 NOTE — Telephone Encounter (Signed)
Take t wo pills every six hours   - he is confused to take every four hours   When does he take his pills.    [phone]

## 2014-01-29 NOTE — Telephone Encounter (Signed)
Please advise pt how to take his Iron supplements.

## 2014-02-12 ENCOUNTER — Ambulatory Visit (HOSPITAL_COMMUNITY)
Admission: RE | Admit: 2014-02-12 | Discharge: 2014-02-12 | Disposition: A | Discharge status: Home / Self Care | Payer: Medicare Other | Source: Ambulatory Visit | Attending: Cardiology | Admitting: Cardiology

## 2014-02-12 ENCOUNTER — Other Ambulatory Visit: Payer: Self-pay | Admitting: *Deleted

## 2014-02-12 ENCOUNTER — Ambulatory Visit: Payer: Medicare Other

## 2014-02-12 VITALS — BP 152/72 | HR 70 | Wt 182.5 lb

## 2014-02-12 DIAGNOSIS — N189 Chronic kidney disease, unspecified: Secondary | ICD-10-CM | POA: Diagnosis not present

## 2014-02-12 DIAGNOSIS — J449 Chronic obstructive pulmonary disease, unspecified: Secondary | ICD-10-CM | POA: Insufficient documentation

## 2014-02-12 DIAGNOSIS — I4891 Unspecified atrial fibrillation: Secondary | ICD-10-CM | POA: Insufficient documentation

## 2014-02-12 DIAGNOSIS — N183 Chronic kidney disease, stage 3 unspecified: Secondary | ICD-10-CM

## 2014-02-12 DIAGNOSIS — I480 Paroxysmal atrial fibrillation: Secondary | ICD-10-CM

## 2014-02-12 DIAGNOSIS — I5022 Chronic systolic (congestive) heart failure: Secondary | ICD-10-CM

## 2014-02-12 DIAGNOSIS — I2510 Atherosclerotic heart disease of native coronary artery without angina pectoris: Secondary | ICD-10-CM | POA: Insufficient documentation

## 2014-02-12 LAB — BASIC METABOLIC PANEL
ANION GAP: 9 (ref 5–15)
BUN: 14 mg/dL (ref 6–23)
CALCIUM: 8.9 mg/dL (ref 8.4–10.5)
CO2: 30 mEq/L (ref 19–32)
Chloride: 100 mEq/L (ref 96–112)
Creatinine, Ser: 1.19 mg/dL (ref 0.50–1.35)
GFR, EST AFRICAN AMERICAN: 64 mL/min — AB (ref >90)
GFR, EST NON AFRICAN AMERICAN: 55 mL/min — AB (ref >90)
Glucose, Bld: 102 mg/dL — ABNORMAL HIGH (ref 70–99)
Potassium: 3.8 mEq/L (ref 3.7–5.3)
SODIUM: 139 meq/L (ref 137–147)

## 2014-02-12 LAB — CBC
HCT: 39.1 % (ref 39.0–52.0)
Hemoglobin: 13 g/dL (ref 13.0–17.0)
MCH: 31.6 pg (ref 26.0–34.0)
MCHC: 33.2 g/dL (ref 30.0–36.0)
MCV: 95.1 fL (ref 78.0–100.0)
PLATELETS: 237 10*3/uL (ref 150–400)
RBC: 4.11 MIL/uL — ABNORMAL LOW (ref 4.22–5.81)
RDW: 13.4 % (ref 11.5–15.5)
WBC: 7 10*3/uL (ref 4.0–10.5)

## 2014-02-12 LAB — PRO B NATRIURETIC PEPTIDE: PRO B NATRI PEPTIDE: 5369 pg/mL — AB (ref 0–450)

## 2014-02-12 MED ORDER — FUROSEMIDE 80 MG PO TABS: ORAL_TABLET | ORAL | Status: DC

## 2014-02-12 MED ORDER — POTASSIUM CHLORIDE CRYS ER 20 MEQ PO TBCR: 20.0000 meq | EXTENDED_RELEASE_TABLET | Freq: Every day | ORAL | Status: DC

## 2014-02-12 MED ORDER — ROSUVASTATIN CALCIUM 5 MG PO TABS: 5.0000 mg | ORAL_TABLET | ORAL | Status: DC

## 2014-02-12 MED ORDER — SACUBITRIL-VALSARTAN 49-51 MG PO TABS: 1.0000 | ORAL_TABLET | Freq: Two times a day (BID) | ORAL | Status: DC

## 2014-02-12 MED ORDER — ALBUTEROL SULFATE (5 MG/ML) 0.5% IN NEBU: 2.5000 mg | INHALATION_SOLUTION | Freq: Three times a day (TID) | RESPIRATORY_TRACT | Status: DC

## 2014-02-12 MED ORDER — IPRATROPIUM BROMIDE 0.02 % IN SOLN: 500.0000 ug | Freq: Three times a day (TID) | RESPIRATORY_TRACT | Status: DC

## 2014-02-12 NOTE — Patient Instructions (Signed)
Increase Furosemide (Lasix) to 80 mg Twice daily for 4 days then 80 mg in AM and 40 mg in PM  Start Potassium 20 meq daily  Start Crestor 5 mg every other day   Stop Losartan  Start Entresto 49/51 mg Twice daily, please active free trial card before going to pharmacy  Labs today  Your physician recommends that you schedule a follow-up appointment in: 2 weeks with labs (bmet)

## 2014-02-12 NOTE — Progress Notes (Signed)
Patient ID: Andrew [name], male   DOB: October 11, 1930, 80 y.o.   MRN: [id number] Andrew [name] Date of Birth: June 01, 1930 Medical Record #[id number] Primary cardiologist: Andrew [name] PCP: Andrew [name]  History of Present Illness: Andrew [name] is an 80 y/o male with a complex medical history which includes COPD (on oxygen at night), CAD with remote MIs and multiple stents, paroxysmal AF/AFL, chronic systolic HF EF 30-35% ( July 2015) was 15% in 2012 (ischemic cardiomyopathy). He is s/p Medtronic CRT-D. Referred by Dr. Conley [name] for further management of his HF,  Other issues include HTN, HLD, CKD, iron deficiency anemia - receiving IV iron. Was admitted back earlier in 2014 with sepsis - had ampicillin sensitive enterococcal bacteremia. His TEE was negative for a vegetation on his device.   Was admitted at the end of March of 2015 with COPD exacerbation and urinary retention. Xarelto dose was cut back due to his kidney function - has now normalized and he was put back on regular dose. Was admitted in July with HF and AFL.   He is on Xarelto for AF. Previously on amio for a long time but stopped to elevation of LFTs.  Currently in NSR.   He is symptomatically stable.  Weight is stable.  At last appointment, Lasix was increased.  He is short of breath if he walks "fast" or goes up an incline.  No orthopnea/PND.  He has occasional fleeting "gas-type" chest pain that is not exertional.  He is not on a statin due to muscle aches with simvastatin and atorvastatin.   He has not tolerated spironolactone or eplerenone in the past due to gynecomastia.  Optivol: Thoracic impedance falling, fluid index still above threshold. Minimal atrial fibrillation.   Labs (1/15): LDL 119 Labs (8/15): K 5.4, creatinine 1.4 => 1.28, TSH 5.07 (mild elevation), BNP 803 Labs (9/15): TSH normal Labs (10/15): K 3.7, creatinine 1.26  Current Outpatient Prescriptions  Medication Sig Dispense Refill  . albuterol (PROVENTIL HFA;VENTOLIN HFA) 108  (90 BASE) MCG/ACT inhaler Inhale 1 puff into the lungs every 6 (six) hours as needed for wheezing or shortness of breath.    [name] albuterol (PROVENTIL) (5 MG/ML) 0.5% nebulizer solution Take 2.5 mg by nebulization every 6 (six) hours as needed for wheezing or shortness of breath.    . ALPRAZolam (XANAX) 0.25 MG tablet Take 0.25 mg by mouth daily as needed. For anxiety.    . carvedilol (COREG) 25 MG tablet TAKE 1/2 TABLET TWICE A DAY WITH MEALS --12.5 mg twice daily    . esomeprazole (NEXIUM) 40 MG capsule Take 40 mg by mouth 2 (two) times daily before a meal.    . fluticasone (FLONASE) 50 MCG/ACT nasal spray Place 2 sprays into both nostrils as needed for allergies. 16 g 11  . Fluticasone-Salmeterol (ADVAIR) 250-50 MCG/DOSE AEPB Inhale 1 puff into the lungs 2 (two) times daily.    . furosemide (LASIX) 80 MG tablet Take 1 tab in AM and 1/2 tab in PM 45 tablet 3  . guaiFENesin (MUCINEX) 600 MG 12 hr tablet Take 600 mg by mouth 2 (two) times daily.    [name] HYDROcodone-acetaminophen (NORCO/VICODIN) 5-325 MG per tablet Take 1 tablet by mouth every 6 (six) hours. Arthritis pain    . ipratropium (ATROVENT) 0.02 % nebulizer solution Take 2.5 mLs (500 mcg total) by nebulization 4 (four) times daily. 75 mL 12  . meclizine (ANTIVERT) 25 MG tablet Take 12.5-25 mg by mouth 3 (three) times daily as needed for dizziness.    [name]  nitroGLYCERIN (NITROSTAT) 0.4 MG SL tablet Place 0.4 mg under the tongue every 5 (five) minutes as needed for chest pain.    . NON FORMULARY Place 2 L into the nose daily as needed (Oxygen).     . rivaroxaban (XARELTO) 20 MG TABS tablet Take 1 tablet (20 mg total) by mouth daily with supper. 90 tablet 3  . Tamsulosin HCl (FLOMAX) 0.4 MG CAPS Take 0.4 mg by mouth 2 (two) times daily.     . potassium chloride SA (K-DUR,KLOR-CON) 20 MEQ tablet Take 1 tablet (20 mEq total) by mouth daily. 30 tablet 3  . rosuvastatin (CRESTOR) 5 MG tablet Take 1 tablet (5 mg total) by mouth every other day. 15 tablet 3   . Sacubitril-Valsartan (ENTRESTO) 49-51 MG TABS Take 1 tablet by mouth 2 (two) times daily. 60 tablet 3  . [DISCONTINUED] metoprolol tartrate (LOPRESSOR) 25 MG tablet Take 1/2 tablet every other day  45 tablet 3   No current facility-administered medications for this encounter.    Allergies  Allergen Reactions  . Codeine Nausea And Vomiting  . Inspra [Eplerenone] Other (See Comments)    Stomach problems  . Spironolactone Other (See Comments)    Gynecomastia  . Doxycycline Itching and Rash  . Sulfonamide Derivatives Itching and Rash    Past Medical History  Diagnosis Date  . Chronic systolic heart failure     a. Chronic LV systolic failure. b. Echo in Dec 2011 showed EF of 25% - f/u echo 02/2011: Mild LVH, EF 15%, posterior lateral akinesis, inferior akinesis, grade 1 diastolic dysfunction, mild LAE. c. s/p BiV pacemaker 06/2011.  [name] Atrial flutter     a. Off amiodarone because of increased LFTs  . COPD (chronic obstructive pulmonary disease)     Chronic SOB, O2 dependent  . Myocardial infarction 1994  . Gastro - esophageal reflux   . Claudication   . BPH (benign prostatic hyperplasia)   . Coronary artery disease     a. Myocardial infarction in 1984,  99 and 07. b. H/o multiple RCA stents. c.  LHC 04/2011: without obstructive disease. oLM 25%, mLAD 30%, pCFX 25%, prox to mid RCA stent ok, dRCA 30% and 70%, pPDA 25%, mPL 25-30%, EF 25%.  [name] Hypertension   . Atrial fibrillation     a. On Xarelto.  . Hemorrhoids   . Hyperlipidemia     statin intolerant 2/2 myalgias  . Hx of adenomatous colonic polyps   . Hx of colonoscopy   . Chronic renal insufficiency   . Biventricular cardiac pacemaker in situ 3.2013  . NSVT (nonsustained ventricular tachycardia)     a. 04/2012- 6 beats on tele.  [name] Shortness of breath   . Arthritis     hips ,back ,hands  . Iron deficiency anemia, unspecified 03/20/2013    Past Surgical History  Procedure Laterality Date  . Cardiac catheterization  2008   . Cataract extraction    . Salivary gland surgery    . Coronary angioplasty with stent placement    . Insert / replace / remove pacemaker  06/16/2011  . Tee without cardioversion  05/08/2012    Procedure: TRANSESOPHAGEAL ECHOCARDIOGRAM (TEE);  Surgeon: Vesta Mixer, MD;  Location: [facility] ENDOSCOPY;  Service: Cardiovascular;  Laterality: N/A;    History  Smoking status  . Former Smoker -- 1.50 packs/day for 55 years  . Types: Cigarettes  . Start date: 05/21/1954  . Quit date: 01/03/2009  Smokeless tobacco  . Never Used    Comment:  quit 4 years ago    History  Alcohol Use No    Comment: occasional    Family History  Problem Relation Age of Onset  . Coronary artery disease Father   . Heart disease Mother   . Colon cancer Neg Hx   . Heart disease Brother     Review of Systems: The review of systems is per the HPI.  All other systems were reviewed and are negative.  Physical Exam: BP 152/72 mmHg  Pulse 70  Wt 182 lb 8 oz (82.781 kg)  SpO2 95% General:  NAD HEENT: normal  Neck: supple. JVP 8-9 cm. Carotids 2+ bilat; no bruits. No lymphadenopathy or thryomegaly appreciated. Cor: PMI nondisplaced. Regular rate & rhythm. No rubs, gallops or murmurs. Lungs: clear with decreased BS throughout Abdomen: soft, nontender, nondistended. No hepatosplenomegaly. No bruits or masses. Good bowel sounds. Extremities: no cyanosis, clubbing, rash.  Trace ankle edema with bilateral lower leg varicosities. Neuro: alert & orientedx3, cranial nerves grossly intact. moves all 4 extremities w/o difficulty. Affect pleasant   Wt Readings from Last 3 Encounters:  02/12/14 182 lb 8 oz (82.781 kg)  01/16/14 180 lb (81.647 kg)  01/11/14 182 lb (82.555 kg)    LABORATORY DATA/PROCEDURES: PENDING  Lab Results  Component Value Date   WBC 7.0 02/12/2014   HGB 13.0 02/12/2014   HCT 39.1 02/12/2014   PLT 237 02/12/2014   GLUCOSE 118* 01/22/2014   CHOL 184 05/02/2013   TRIG 243.0* 05/02/2013    HDL 29.50* 05/02/2013   LDLDIRECT 119.4 05/02/2013   LDLCALC 83 03/17/2012   ALT <8 08/13/2013   AST 14 08/13/2013   NA 139 01/22/2014   K 3.7 01/22/2014   CL 100 01/22/2014   CREATININE 1.26 01/22/2014   BUN 15 01/22/2014   CO2 29 01/22/2014   TSH 3.927 12/28/2013   INR 1.77* 05/03/2012   HGBA1C 6.0* 03/10/2011    BNP (last 3 results)  Recent Labs  06/25/13 1714 10/10/13 1507 10/22/13 1845  PROBNP 6196.0* 1021.0* 6264.0*   Assessment / Plan: 1. Chronic systolic CHF: EF 65-78%30-35% on last echo, ischemic cardiomyopathy.  Stable NYHA class II-III symptoms.  He still has some volume overload on exam and Optivol confirms volume overload.  BP is elevated today. - Continue current Coreg.  - Stop losartan, start Entresto 49/51 mg bid.   - Increase Lasix to 80 mg bid x 4 days then 80 qam/40 qpm (will take afternoon dose every day rather than evey other day).   - Add KCl 20 daily.  - No spironolactone due to gynecomastia.  He was tried on eplerenone but did not tolerate this either.  - BMET/BNP today and repeat BMET in 2 wks at followup. 2. COPD on nocturnal oxygen: I suspect that this is contributing to his exertional symptoms.  3. CAD s/p multiple PCIs: No evidence of ischemia (previous angina was arm pain). He is on Xarelto with stable CAD so no ASA.  He has not been on a statin.  He is willing to try a statin again.  I will start him on Crestor 5 mg every other day with lipids/LFTs in 2 months.  4. Atrial flutter/fib/VT/PVCs: Now in NSR. no longer on amiodarone. On xarelto for anti-coagulation.  GFR has been borderline for 20 mg Xarelto, so far > 50.  Minimal atrial fibrillation by Optivol check.  Check CBC today.  5. CKD: Follow BMET as above.  Last creatinine 1.26.   Followup in 2 wks.   Micahel Omlor  Lavenia Stumpo,MD 02/12/2014

## 2014-02-12 NOTE — Telephone Encounter (Signed)
Pt needed scripts with his remaining refills re printed due to insurance changing pharmacies

## 2014-02-17 NOTE — Progress Notes (Signed)
This encounter was created in error - please disregard.

## 2014-02-22 ENCOUNTER — Telehealth: Payer: Self-pay | Admitting: Cardiology

## 2014-02-26 ENCOUNTER — Ambulatory Visit (HOSPITAL_COMMUNITY)
Admission: RE | Admit: 2014-02-26 | Discharge: 2014-02-26 | Disposition: A | Discharge status: Home / Self Care | Payer: Medicare Other | Source: Ambulatory Visit | Attending: Cardiology | Admitting: Cardiology

## 2014-02-26 ENCOUNTER — Encounter (HOSPITAL_COMMUNITY): Payer: Self-pay

## 2014-02-26 VITALS — BP 90/54 | HR 80 | Resp 18 | Wt 182.4 lb

## 2014-02-26 DIAGNOSIS — I480 Paroxysmal atrial fibrillation: Secondary | ICD-10-CM

## 2014-02-26 DIAGNOSIS — I4892 Unspecified atrial flutter: Secondary | ICD-10-CM | POA: Diagnosis not present

## 2014-02-26 DIAGNOSIS — I5022 Chronic systolic (congestive) heart failure: Secondary | ICD-10-CM

## 2014-02-26 DIAGNOSIS — I2510 Atherosclerotic heart disease of native coronary artery without angina pectoris: Secondary | ICD-10-CM

## 2014-02-26 DIAGNOSIS — N189 Chronic kidney disease, unspecified: Secondary | ICD-10-CM | POA: Insufficient documentation

## 2014-02-26 DIAGNOSIS — J449 Chronic obstructive pulmonary disease, unspecified: Secondary | ICD-10-CM | POA: Insufficient documentation

## 2014-02-26 DIAGNOSIS — N183 Chronic kidney disease, stage 3 unspecified: Secondary | ICD-10-CM

## 2014-02-26 LAB — BASIC METABOLIC PANEL
Anion gap: 13 (ref 5–15)
BUN: 17 mg/dL (ref 6–23)
CHLORIDE: 101 meq/L (ref 96–112)
CO2: 23 mEq/L (ref 19–32)
CREATININE: 1.32 mg/dL (ref 0.50–1.35)
Calcium: 8.6 mg/dL (ref 8.4–10.5)
GFR calc Af Amer: 57 mL/min — ABNORMAL LOW (ref >90)
GFR, EST NON AFRICAN AMERICAN: 49 mL/min — AB (ref >90)
Glucose, Bld: 128 mg/dL — ABNORMAL HIGH (ref 70–99)
POTASSIUM: 4.2 meq/L (ref 3.7–5.3)
Sodium: 137 mEq/L (ref 137–147)

## 2014-02-26 MED ORDER — FUROSEMIDE 80 MG PO TABS: ORAL_TABLET | ORAL | Status: DC

## 2014-02-26 NOTE — Patient Instructions (Signed)
Doing great!  Take extra 1/2 tablet (40 mg) of lasix if weight greater than 180lbs.  Follow up in 4 weeks with doctor.  Happy Thanksgiving!  Do the following things EVERYDAY: 1) Weigh yourself in the morning before breakfast. Write it down and keep it in a log. 2) Take your medicines as prescribed 3) Eat low salt foods-Limit salt (sodium) to 2000 mg per day.  4) Stay as active as you can everyday 5) Limit all fluids for the day to less than 2 liters

## 2014-02-26 NOTE — Telephone Encounter (Signed)
Closed encounter °

## 2014-02-26 NOTE — Progress Notes (Signed)
Patient ID: Andrew [name], male   DOB: 1930/10/08, 80 y.o.   MRN: [id number] Andrew [name] Date of Birth: 25-Nov-1930 Medical Record #[id number] Primary cardiologist: Andrew [name] PCP: Andrew [name]  History of Present Illness: Andrew [name] is an 80 y/o male with a complex medical history which includes COPD (on oxygen at night), CAD with remote MIs and multiple stents, paroxysmal AF/AFL, chronic systolic HF EF 30-35% ( July 2015) was 15% in 2012 (ischemic cardiomyopathy). He is s/p Medtronic CRT-D. Referred by Dr. Conley [name] for further management of his HF,  Other issues include HTN, HLD, CKD, iron deficiency anemia - receiving IV iron. Was admitted back earlier in 2014 with sepsis - had ampicillin sensitive enterococcal bacteremia. His TEE was negative for a vegetation on his device.   Was admitted at the end of March of 2015 with COPD exacerbation and urinary retention. Xarelto dose was cut back due to his kidney function - has now normalized and he was put back on regular dose. Was admitted in July with HF and AFL.   He is on Xarelto for AF. Previously on amio for a long time but stopped to elevation of LFTs.    He returns for follow up. Last visit he was instructed to stop losartan, start Entresto 49/51 mg bid.  Also had volume overload and instructed to increase Lasix to 80 mg bid x 4 days then 80 qam/40 qpm. He was also started on crestor but has developed muscle aches again. He says his systolic blood pressure low 100s. Denies dizziness. Breathing at baseline. Mild dypnea with exertion. Denies orthopnea. Weight at home 178-181 pounds. Taking all medications.     He has not tolerated spironolactone or eplerenone in the past due to gynecomastia.He has not tolerated statin in the past due to muscle aches with simvastatin and atorvastatin.  Optivol: fluid index below threshold. Minimal atrial fibrillation. Activity about 2 hours per day.   Labs (1/15): LDL 119 Labs (8/15): K 5.4, creatinine 1.4 => 1.28, TSH  5.07 (mild elevation), BNP 803 Labs (9/15): TSH normal Labs (10/15): K 3.7, creatinine 1.26 Labs (02/12/14): K 3.8 Creatinine 1.19 Hgb 13.0   Current Outpatient Prescriptions  Medication Sig Dispense Refill  . albuterol (PROVENTIL HFA;VENTOLIN HFA) 108 (90 BASE) MCG/ACT inhaler Inhale 1 puff into the lungs every 6 (six) hours as needed for wheezing or shortness of breath.    [name] albuterol (PROVENTIL) (5 MG/ML) 0.5% nebulizer solution Take 0.5 mLs (2.5 mg total) by nebulization 3 (three) times daily. 20 mL 8  . ALPRAZolam (XANAX) 0.25 MG tablet Take 0.25 mg by mouth daily as needed. For anxiety.    . carvedilol (COREG) 25 MG tablet TAKE 1/2 TABLET TWICE A DAY WITH MEALS --12.5 mg twice daily    . esomeprazole (NEXIUM) 40 MG capsule Take 40 mg by mouth 2 (two) times daily before a meal.    . fluticasone (FLONASE) 50 MCG/ACT nasal spray Place 2 sprays into both nostrils as needed for allergies. 16 g 11  . Fluticasone-Salmeterol (ADVAIR) 250-50 MCG/DOSE AEPB Inhale 1 puff into the lungs 2 (two) times daily.    . furosemide (LASIX) 80 MG tablet Take 1 tab in AM and 1/2 tab in PM 45 tablet 3  . guaiFENesin (MUCINEX) 600 MG 12 hr tablet Take 600 mg by mouth 2 (two) times daily.    [name] HYDROcodone-acetaminophen (NORCO/VICODIN) 5-325 MG per tablet Take 1 tablet by mouth every 6 (six) hours. Arthritis pain    . ipratropium (ATROVENT) 0.02 % nebulizer solution  Take 2.5 mLs (500 mcg total) by nebulization 3 (three) times daily. 75 mL 8  . meclizine (ANTIVERT) 25 MG tablet Take 12.5-25 mg by mouth 3 (three) times daily as needed for dizziness.    . nitroGLYCERIN (NITROSTAT) 0.4 MG SL tablet Place 0.4 mg under the tongue every 5 (five) minutes as needed for chest pain.    . NON FORMULARY Place 2 L into the nose daily as needed (Oxygen).     . potassium chloride SA (K-DUR,KLOR-CON) 20 MEQ tablet Take 1 tablet (20 mEq total) by mouth daily. 30 tablet 3  . rivaroxaban (XARELTO) 20 MG TABS tablet Take 1 tablet (20  mg total) by mouth daily with supper. 90 tablet 3  . rosuvastatin (CRESTOR) 5 MG tablet Take 1 tablet (5 mg total) by mouth every other day. 15 tablet 3  . Sacubitril-Valsartan (ENTRESTO) 49-51 MG TABS Take 1 tablet by mouth 2 (two) times daily. 60 tablet 3  . Tamsulosin HCl (FLOMAX) 0.4 MG CAPS Take 0.4 mg by mouth 2 (two) times daily.     . [DISCONTINUED] metoprolol tartrate (LOPRESSOR) 25 MG tablet Take 1/2 tablet every other day  45 tablet 3   No current facility-administered medications for this encounter.    Allergies  Allergen Reactions  . Codeine Nausea And Vomiting  . Inspra [Eplerenone] Other (See Comments)    Stomach problems  . Spironolactone Other (See Comments)    Gynecomastia  . Doxycycline Itching and Rash  . Sulfonamide Derivatives Itching and Rash    Past Medical History  Diagnosis Date  . Chronic systolic heart failure     a. Chronic LV systolic failure. b. Echo in Dec 2011 showed EF of 25% - f/u echo 02/2011: Mild LVH, EF 15%, posterior lateral akinesis, inferior akinesis, grade 1 diastolic dysfunction, mild LAE. c. s/p BiV pacemaker 06/2011.  [name]. Atrial flutter     a. Off amiodarone because of increased LFTs  . COPD (chronic obstructive pulmonary disease)     Chronic SOB, O2 dependent  . Myocardial infarction 1994  . Gastro - esophageal reflux   . Claudication   . BPH (benign prostatic hyperplasia)   . Coronary artery disease     a. Myocardial infarction in 1984,  99 and 07. b. H/o multiple RCA stents. c.  LHC 04/2011: without obstructive disease. oLM 25%, mLAD 30%, pCFX 25%, prox to mid RCA stent ok, dRCA 30% and 70%, pPDA 25%, mPL 25-30%, EF 25%.  [name]. Hypertension   . Atrial fibrillation     a. On Xarelto.  . Hemorrhoids   . Hyperlipidemia     statin intolerant 2/2 myalgias  . Hx of adenomatous colonic polyps   . Hx of colonoscopy   . Chronic renal insufficiency   . Biventricular cardiac pacemaker in situ 3.2013  . NSVT (nonsustained ventricular  tachycardia)     a. 04/2012- 6 beats on tele.  [name]. Shortness of breath   . Arthritis     hips ,back ,hands  . Iron deficiency anemia, unspecified 03/20/2013    Past Surgical History  Procedure Laterality Date  . Cardiac catheterization  2008  . Cataract extraction    . Salivary gland surgery    . Coronary angioplasty with stent placement    . Insert / replace / remove pacemaker  06/16/2011  . Tee without cardioversion  05/08/2012    Procedure: TRANSESOPHAGEAL ECHOCARDIOGRAM (TEE);  Surgeon: Vesta MixerPhilip J Nahser, MD;  [facility];  Service: Cardiovascular;  Laterality: N/A;  History  Smoking status  . Former Smoker -- 1.50 packs/day for 55 years  . Types: Cigarettes  . Start date: 05/21/1954  . Quit date: 01/03/2009  Smokeless tobacco  . Never Used    Comment: quit 4 years ago    History  Alcohol Use No    Comment: occasional    Family History  Problem Relation Age of Onset  . Coronary artery disease Father   . Heart disease Mother   . Colon cancer Neg Hx   . Heart disease Brother     Review of Systems: The review of systems is per the HPI.  All other systems were reviewed and are negative.  Physical Exam: BP 90/54 mmHg  Pulse 80  Resp 18  Wt 182 lb 6 oz (82.725 kg)  SpO2 95% General:  NAD HEENT: normal  Neck: supple. JVP 8-9 cm. Carotids 2+ bilat; no bruits. No lymphadenopathy or thryomegaly appreciated. Cor: PMI nondisplaced. Regular rate & rhythm. No rubs, gallops or murmurs. Lungs: clear with decreased BS throughout Abdomen: soft, nontender, nondistended. No hepatosplenomegaly. No bruits or masses. Good bowel sounds. Extremities: no cyanosis, clubbing, rash.  Trace ankle edema with bilateral lower leg varicosities. Neuro: alert & orientedx3, cranial nerves grossly intact. moves all 4 extremities w/o difficulty. Affect pleasant    Wt Readings from Last 3 Encounters:  02/26/14 182 lb 6 oz (82.725 kg)  02/12/14 182 lb 8 oz (82.781 kg)  01/16/14 180  lb (81.647 kg)    LABORATORY DATA/PROCEDURES: PENDING  Lab Results  Component Value Date   WBC 7.0 02/12/2014   HGB 13.0 02/12/2014   HCT 39.1 02/12/2014   PLT 237 02/12/2014   GLUCOSE 102* 02/12/2014   CHOL 184 05/02/2013   TRIG 243.0* 05/02/2013   HDL 29.50* 05/02/2013   LDLDIRECT 119.4 05/02/2013   LDLCALC 83 03/17/2012   ALT <8 08/13/2013   AST 14 08/13/2013   NA 139 02/12/2014   K 3.8 02/12/2014   CL 100 02/12/2014   CREATININE 1.19 02/12/2014   BUN 14 02/12/2014   CO2 30 02/12/2014   TSH 3.927 12/28/2013   INR 1.77* 05/03/2012   HGBA1C 6.0* 03/10/2011    BNP (last 3 results)  Recent Labs  10/10/13 1507 10/22/13 1845 02/12/14 0953  PROBNP 1021.0* 6264.0* 5369.0*   Assessment / Plan: 1. Chronic systolic CHF: EF 78-46%30-35% on last echo, ischemic cardiomyopathy.  Stable NYHA class II-III symptoms.  Volume status stable . Continue lasix 80 mg in am and 40 mg in pm. Take 20 meq potassium daily. Optivol - fluid below threshold Activity about 2 hours per day.  - Continue current Coreg 12.5 mg twice a day  - Continue  Entresto 49/51 mg bid will not up titrate due to soft BP    -  No spironolactone due to gynecomastia.  He was tried on eplerenone but did not tolerate this either.  - BMET today  2. COPD on nocturnal oxygen: wears at night.  3. CAD s/p multiple PCIs: No evidence of ischemia . He is on Xarelto with stable CAD so no ASA.  He has not been on a statin.  Stop crestor due to muscle aches.   4. Atrial flutter/fib/VT/PVCs: Appears in regular rhythm. No longer on amiodarone. On xarelto for anti-coagulation.   20 mg Xarelto mg daily.  Minimal atrial fibrillation by Optivol check.   5. CKD: Check BMET as above.---> K 4.2 Creatinine 1.32    Follow up in 4 weeks with MD.  Hosie SpangleLEGG,Brealynn Contino,NP-C  02/26/2014    

## 2014-03-01 ENCOUNTER — Other Ambulatory Visit: Payer: Self-pay | Admitting: Emergency Medicine

## 2014-03-07 ENCOUNTER — Other Ambulatory Visit: Payer: Self-pay | Admitting: Internal Medicine

## 2014-03-11 ENCOUNTER — Encounter: Payer: Self-pay | Admitting: Internal Medicine

## 2014-03-12 ENCOUNTER — Encounter: Payer: Self-pay | Admitting: Emergency Medicine

## 2014-03-12 ENCOUNTER — Ambulatory Visit (INDEPENDENT_AMBULATORY_CARE_PROVIDER_SITE_OTHER): Payer: Medicare Other | Admitting: Emergency Medicine

## 2014-03-12 VITALS — BP 130/80 | HR 71 | Temp 97.7°F | Resp 16 | Ht 69.75 in | Wt 180.8 lb

## 2014-03-12 DIAGNOSIS — Z23 Encounter for immunization: Secondary | ICD-10-CM

## 2014-03-12 DIAGNOSIS — Z8679 Personal history of other diseases of the circulatory system: Secondary | ICD-10-CM

## 2014-03-12 DIAGNOSIS — J449 Chronic obstructive pulmonary disease, unspecified: Secondary | ICD-10-CM

## 2014-03-12 DIAGNOSIS — Z0000 Encounter for general adult medical examination without abnormal findings: Secondary | ICD-10-CM

## 2014-03-12 DIAGNOSIS — M5489 Other dorsalgia: Secondary | ICD-10-CM

## 2014-03-12 MED ORDER — NITROGLYCERIN 0.4 MG SL SUBL: 0.4000 mg | SUBLINGUAL_TABLET | SUBLINGUAL | Status: AC | PRN

## 2014-03-12 NOTE — Progress Notes (Signed)
Subjective:    Patient ID: Andrew [name], male    DOB: Jan 11, 1931, 80 y.o.   MRN: [id number]  HPI This is a pleasant 80 yo male who presents today for a CPE. Is seen at the CHF clinic with Drs. McClean and Bensimon. Sees Dr. Graciela HusbandsKlein for pacemaker management/ afib. Is doing well on current medication regimen. Weight right at 178-179. Weighs daily..   Does not currently see pulmonary. Feels like he is "puffing" a little more with his breathing the last couple of days. Has been decorating for Christmas and doing wiring.   Patient feels that his most limiting issue is back and hip pain. He is not able to stoop and bend and do the things he enjoys like yard work. Takes vicodin every am/pm and then as needed mid day. Denies dizziness or gait instability. Does need to sit on edge of bed first thing in the morning prior to getting up. He had a fall with scalp laceration    Occasional constipation, lately has had increased number of stools with the addition of Entresto. No diarrhea. No blood, no dark stools.     Patient Active Problem List   Diagnosis Date Noted  . CKD (chronic kidney disease) stage 3, GFR 30-59 ml/min 01/13/2014  . Hypothyroidism 12/03/2013  . CAP (community acquired pneumonia), possible 10/23/2013  . Chronic respiratory failure 10/23/2013  . Other malaise and fatigue 10/23/2013  . Acute on chronic systolic CHF (congestive heart failure) 10/22/2013  . Urinary retention 06/26/2013  . COPD exacerbation 06/25/2013  . Iron deficiency anemia 03/20/2013  . S/P biventricular cardiac pacemaker-Medtronic 06/17/2011  . Atrial fibrillation 04/06/2011  . Coronary atherosclerosis of native coronary artery 01/15/2011  . Hyperlipidemia 11/26/2010  . Paroxysmal ventricular tachycardia 11/05/2010  . Ischemic cardiomyopathy 10/14/2010  . Atrial flutter   . Chronic systolic heart failure   . Hypertension   . Nonspecific (abnormal) findings on radiological and other examination of body  structure 01/12/2010  . ABNORMAL LUNG XRAY 01/12/2010  . DYSPNEA 12/02/2009  . LIVER FUNCTION TESTS, ABNORMAL, HX OF 12/02/2009  . UNSPECIFIED ANEMIA 05/05/2009  . GERD 04/04/2007  . PERSONAL HISTORY OF COLONIC POLYPS 04/04/2007    Review of Systems     Objective:   Physical Exam  Constitutional: He is oriented to person, place, and time. He appears well-developed and well-nourished.  HENT:  Head: Normocephalic and atraumatic.  Right Ear: External ear normal.  Left Ear: External ear normal.  Nose: Nose normal.  Mouth/Throat: Oropharynx is clear and moist. No oropharyngeal exudate.  Dried cerumen bilaterally.  Eyes: Conjunctivae and EOM are normal. Pupils are equal, round, and reactive to light.  Neck: Normal range of motion. Neck supple. No JVD present.  Cardiovascular: Normal rate, regular rhythm, normal heart sounds and intact distal pulses.  Exam reveals no gallop and no friction rub.   No murmur heard. Pulmonary/Chest: Effort normal and breath sounds normal. No respiratory distress. He has no wheezes. He has no rales. He exhibits no tenderness.  Abdominal: Soft. Bowel sounds are normal. He exhibits no distension and no mass. There is no tenderness. There is no rebound and no guarding.  Genitourinary: Rectum normal, prostate normal, testes normal and penis normal. Right testis shows no mass, no swelling and no tenderness. Left testis shows no mass, no swelling and no tenderness. Circumcised. No penile tenderness.  Musculoskeletal: Normal range of motion. He exhibits edema (trace pretibial). He exhibits no tenderness.  Lymphadenopathy:    He has no cervical adenopathy.  Neurological: He is alert and oriented to person, place, and time. He has normal reflexes.  Skin: Skin is warm and dry.  Psychiatric: He has a normal mood and affect. His behavior is normal. Judgment and thought content normal.  Vitals reviewed. BP 130/80 mmHg  Pulse 71  Temp(Src) 97.7 F (36.5 C) (Oral)   Resp 16  Ht 5' 9.75" (1.772 m)  Wt 180 lb 12.8 oz (82.01 kg)  BMI 26.12 kg/m2  SpO2 97%    Assessment & Plan:  1. Encounter for Medicare annual wellness exam  2. History of CHF (congestive heart failure) - Continued follow up at CHF clinic  3. Advanced COPD -Stable on current meds -Continue night time O2  4. Midline back pain, unspecified location -Offered patient PT referral, patient prefers to continue intermittent chiropractor visits and vicodin   Andrew Belfasteborah B. Gessner, FNP-BC  [facility]  03/12/2014 4:34 PM

## 2014-03-13 ENCOUNTER — Telehealth (HOSPITAL_COMMUNITY): Payer: Self-pay | Admitting: Vascular Surgery

## 2014-03-13 NOTE — Telephone Encounter (Signed)
PT called he is out of Entruso.[name]. He called his pharmacy and the pharmacy told him the the medication is not authorized ... Do We get the medication auth?  Please advise

## 2014-03-14 ENCOUNTER — Telehealth (HOSPITAL_COMMUNITY): Payer: Self-pay | Admitting: Vascular Surgery

## 2014-03-14 ENCOUNTER — Encounter (HOSPITAL_COMMUNITY): Payer: Self-pay | Admitting: Internal Medicine

## 2014-03-14 MED ORDER — SACUBITRIL-VALSARTAN 24-26 MG PO TABS: 1.0000 | ORAL_TABLET | Freq: Two times a day (BID) | ORAL | Status: DC

## 2014-03-14 NOTE — Telephone Encounter (Signed)
11/10 pt was changed from Losartan to Entresto 49/51 mg Twice daily, spoke w/pt he states his BP has been running low since starting Entresto, it has been in the 90s, he states he feels tired, weak and a little dizzy especially when he first gets up, will send to Dr Shirlee LatchMcLean for review and will call him back

## 2014-03-14 NOTE — Telephone Encounter (Signed)
Pt aware and agreeable, new rx for 24/26 mg tabs sent to pharmacy

## 2014-03-14 NOTE — Telephone Encounter (Signed)
First try cutting back to 24/26 bid to see if that helps.  If still low BP, go back to losartan.  Suspect cutting in half may make it tolerable.

## 2014-03-14 NOTE — Telephone Encounter (Signed)
Pt called he would like to stop the Entrusto and go back to Losartan... He hasnt been feeling well while taking this medication... Please advise

## 2014-03-15 ENCOUNTER — Encounter: Payer: Self-pay | Admitting: Internal Medicine

## 2014-03-15 ENCOUNTER — Ambulatory Visit (INDEPENDENT_AMBULATORY_CARE_PROVIDER_SITE_OTHER): Payer: Medicare Other | Admitting: Internal Medicine

## 2014-03-15 VITALS — BP 120/68 | HR 71 | Ht 70.0 in | Wt 184.0 lb

## 2014-03-15 DIAGNOSIS — I5022 Chronic systolic (congestive) heart failure: Secondary | ICD-10-CM

## 2014-03-15 DIAGNOSIS — Z4502 Encounter for adjustment and management of automatic implantable cardiac defibrillator: Secondary | ICD-10-CM

## 2014-03-15 DIAGNOSIS — I4729 Other ventricular tachycardia: Secondary | ICD-10-CM

## 2014-03-15 DIAGNOSIS — I255 Ischemic cardiomyopathy: Secondary | ICD-10-CM

## 2014-03-15 DIAGNOSIS — I472 Ventricular tachycardia: Secondary | ICD-10-CM

## 2014-03-15 DIAGNOSIS — I480 Paroxysmal atrial fibrillation: Secondary | ICD-10-CM

## 2014-03-15 DIAGNOSIS — I5023 Acute on chronic systolic (congestive) heart failure: Secondary | ICD-10-CM

## 2014-03-15 LAB — MDC_IDC_ENUM_SESS_TYPE_INCLINIC
Battery Remaining Longevity: 59 mo
Battery Remaining Longevity: 59 mo
Battery Voltage: 3.01 V
Brady Statistic AP VP Percent: 8.99 %
Brady Statistic AP VS Percent: 0.01 %
Brady Statistic AS VP Percent: 84.25 %
Brady Statistic AS VS Percent: 6.76 %
Brady Statistic AS VS Percent: 6.76 %
Brady Statistic RA Percent Paced: 9 %
Brady Statistic RA Percent Paced: 9 %
Brady Statistic RV Percent Paced: 93.23 %
Date Time Interrogation Session: 20151211112831
Lead Channel Impedance Value: 1026 Ohm
Lead Channel Impedance Value: 1368 Ohm
Lead Channel Impedance Value: 399 Ohm
Lead Channel Impedance Value: 399 Ohm
Lead Channel Impedance Value: 456 Ohm
Lead Channel Impedance Value: 494 Ohm
Lead Channel Impedance Value: 646 Ohm
Lead Channel Impedance Value: 646 Ohm
Lead Channel Impedance Value: 893 Ohm
Lead Channel Pacing Threshold Amplitude: 0.5 V
Lead Channel Pacing Threshold Amplitude: 0.625 V
Lead Channel Pacing Threshold Amplitude: 0.625 V
Lead Channel Pacing Threshold Amplitude: 1 V
Lead Channel Pacing Threshold Pulse Width: 0.4 ms
Lead Channel Pacing Threshold Pulse Width: 0.4 ms
Lead Channel Pacing Threshold Pulse Width: 0.4 ms
Lead Channel Pacing Threshold Pulse Width: 0.4 ms
Lead Channel Pacing Threshold Pulse Width: 0.4 ms
Lead Channel Sensing Intrinsic Amplitude: 2.375 mV
Lead Channel Sensing Intrinsic Amplitude: 31.625 mV
Lead Channel Sensing Intrinsic Amplitude: 31.625 mV
Lead Channel Setting Pacing Amplitude: 2 V
Lead Channel Setting Pacing Pulse Width: 0.4 ms
Lead Channel Setting Pacing Pulse Width: 0.4 ms
Lead Channel Setting Pacing Pulse Width: 0.4 ms
Lead Channel Setting Pacing Pulse Width: 0.4 ms
Lead Channel Setting Sensing Sensitivity: 0.9 mV
MDC IDC MSMT BATTERY VOLTAGE: 3.01 V
MDC IDC MSMT LEADCHNL LV IMPEDANCE VALUE: 1026 Ohm
MDC IDC MSMT LEADCHNL LV IMPEDANCE VALUE: 1368 Ohm
MDC IDC MSMT LEADCHNL LV IMPEDANCE VALUE: 741 Ohm
MDC IDC MSMT LEADCHNL LV IMPEDANCE VALUE: 741 Ohm
MDC IDC MSMT LEADCHNL LV IMPEDANCE VALUE: 893 Ohm
MDC IDC MSMT LEADCHNL LV PACING THRESHOLD AMPLITUDE: 1 V
MDC IDC MSMT LEADCHNL LV PACING THRESHOLD PULSEWIDTH: 0.4 ms
MDC IDC MSMT LEADCHNL RA IMPEDANCE VALUE: 361 Ohm
MDC IDC MSMT LEADCHNL RA IMPEDANCE VALUE: 361 Ohm
MDC IDC MSMT LEADCHNL RA IMPEDANCE VALUE: 494 Ohm
MDC IDC MSMT LEADCHNL RA SENSING INTR AMPL: 2.375 mV
MDC IDC MSMT LEADCHNL RV IMPEDANCE VALUE: 456 Ohm
MDC IDC MSMT LEADCHNL RV PACING THRESHOLD AMPLITUDE: 0.5 V
MDC IDC SESS DTM: 20151211105641
MDC IDC SET LEADCHNL LV PACING AMPLITUDE: 2 V
MDC IDC SET LEADCHNL LV PACING AMPLITUDE: 2 V
MDC IDC SET LEADCHNL RA PACING AMPLITUDE: 2 V
MDC IDC SET LEADCHNL RA PACING AMPLITUDE: 2 V
MDC IDC SET LEADCHNL RV PACING AMPLITUDE: 2 V
MDC IDC SET LEADCHNL RV SENSING SENSITIVITY: 0.9 mV
MDC IDC SET ZONE DETECTION INTERVAL: 350 ms
MDC IDC SET ZONE DETECTION INTERVAL: 400 ms
MDC IDC STAT BRADY AP VP PERCENT: 8.99 %
MDC IDC STAT BRADY AP VS PERCENT: 0.01 %
MDC IDC STAT BRADY AS VP PERCENT: 84.25 %
MDC IDC STAT BRADY RV PERCENT PACED: 93.23 %
Zone Setting Detection Interval: 350 ms
Zone Setting Detection Interval: 400 ms

## 2014-03-15 MED ORDER — RANOLAZINE ER 500 MG PO TB12: 500.0000 mg | ORAL_TABLET | Freq: Two times a day (BID) | ORAL | Status: DC

## 2014-03-15 NOTE — Progress Notes (Signed)
Patient Care Team: Collene GobbleSteven A Daub, MD as PCP - General (Family Medicine)   HPI  Candace Gallusdward L Dorfman is a 80 y.o. male Seen in follow-up after he presented in the spring with worsening shortness of breath that corresponded with the development of frequent ventricular ectopy.  He has a history of ischemic heart disease with ejection fractions ranging in a 15-30% range most recently assessed 2014.  He has a Medtronic CRT-D device.  He has a history of atrial fibrillation previously on amiodarone discontinued because of LFT abnormalities. He thinks this was stopped about 5 years ago.   He is also been seen intercurrently by the heart failure clinic.    There've been no changes in his medications apart from his diuretics.   Past Medical History  Diagnosis Date  . Chronic systolic heart failure     a. Chronic LV systolic failure. b. Echo in Dec 2011 showed EF of 25% - f/u echo 02/2011: Mild LVH, EF 15%, posterior lateral akinesis, inferior akinesis, grade 1 diastolic dysfunction, mild LAE. c. s/p BiV pacemaker 06/2011.  [name]. Atrial flutter     a. Off amiodarone because of increased LFTs  . COPD (chronic obstructive pulmonary disease)     Chronic SOB, O2 dependent  . Myocardial infarction 1994  . Gastro - esophageal reflux   . Claudication   . BPH (benign prostatic hyperplasia)   . Coronary artery disease     a. Myocardial infarction in 1984,  99 and 07. b. H/o multiple RCA stents. c.  LHC 04/2011: without obstructive disease. oLM 25%, mLAD 30%, pCFX 25%, prox to mid RCA stent ok, dRCA 30% and 70%, pPDA 25%, mPL 25-30%, EF 25%.  [name]. Hypertension   . Atrial fibrillation     a. On Xarelto.  . Hemorrhoids   . Hyperlipidemia     statin intolerant 2/2 myalgias  . Hx of adenomatous colonic polyps   . Hx of colonoscopy   . Chronic renal insufficiency   . Biventricular cardiac pacemaker in situ 3.2013  . NSVT (nonsustained ventricular tachycardia)     a. 04/2012- 6 beats on tele.  [name].  Shortness of breath   . Arthritis     hips ,back ,hands  . Iron deficiency anemia, unspecified 03/20/2013    Past Surgical History  Procedure Laterality Date  . Cardiac catheterization  2008  . Cataract extraction    . Salivary gland surgery    . Coronary angioplasty with stent placement    . Insert / replace / remove pacemaker  06/16/2011  . Tee without cardioversion  05/08/2012    Procedure: TRANSESOPHAGEAL ECHOCARDIOGRAM (TEE);  Surgeon: Vesta MixerPhilip J Nahser, MD;  [facility];  Service: Cardiovascular;  Laterality: N/A;  . Bi-ventricular pacemaker insertion N/A 06/16/2011    Procedure: BI-VENTRICULAR PACEMAKER INSERTION (CRT-P);  Surgeon: Duke SalviaSteven C Annalis Kaczmarczyk, MD;  [facility];  Service: Cardiovascular;  Laterality: N/A;    Current Outpatient Prescriptions  Medication Sig Dispense Refill  . albuterol (PROAIR HFA) 108 (90 BASE) MCG/ACT inhaler Inhale 1 to 2 puffs everys 6 hours as needed (Patient taking differently: Inhale 1 to 2 puffs everys 6 hours as needed for wheezing) 3 each 0  . ALPRAZolam (XANAX) 0.25 MG tablet Take 0.25 mg by mouth daily as needed. For anxiety.    . carvedilol (COREG) 25 MG tablet TAKE 1/2 TABLET TWICE A DAY WITH MEALS --12.5 mg twice daily    . esomeprazole (NEXIUM) 40 MG capsule Take 40 mg  by mouth 2 (two) times daily before a meal.    . fluticasone (FLONASE) 50 MCG/ACT nasal spray Place 2 sprays into both nostrils as needed for allergies. 16 g 11  . Fluticasone-Salmeterol (ADVAIR) 250-50 MCG/DOSE AEPB Inhale 1 puff into the lungs 2 (two) times daily.    . furosemide (LASIX) 80 MG tablet Take 1 tab in AM and 1/2 tab in PM.  Take extra 1/2 tablet for weight greater than 180lb. 45 tablet 3  . guaiFENesin (MUCINEX) 600 MG 12 hr tablet Take 600 mg by mouth 2 (two) times daily.    [name]. HYDROcodone-acetaminophen (NORCO/VICODIN) 5-325 MG per tablet Take 1 tablet by mouth every 6 (six) hours. Arthritis pain    . ipratropium (ATROVENT) 0.02 % nebulizer solution  Take 2.5 mLs (500 mcg total) by nebulization 3 (three) times daily. 75 mL 8  . meclizine (ANTIVERT) 25 MG tablet Take 12.5-25 mg by mouth 3 (three) times daily as needed for dizziness.    . nitroGLYCERIN (NITROSTAT) 0.4 MG SL tablet Place 1 tablet (0.4 mg total) under the tongue every 5 (five) minutes as needed for chest pain. 25 tablet 1  . NON FORMULARY Place 2 L into the nose daily as needed (Oxygen).     . potassium chloride SA (K-DUR,KLOR-CON) 20 MEQ tablet Take 1 tablet (20 mEq total) by mouth daily. 30 tablet 3  . rivaroxaban (XARELTO) 20 MG TABS tablet Take 1 tablet (20 mg total) by mouth daily with supper. 90 tablet 3  . rosuvastatin (CRESTOR) 5 MG tablet Take 1 tablet (5 mg total) by mouth every other day. 15 tablet 3  . sacubitril-valsartan (ENTRESTO) 24-26 MG Take 1 tablet by mouth 2 (two) times daily. 60 tablet 3  . Tamsulosin HCl (FLOMAX) 0.4 MG CAPS Take 0.4 mg by mouth 2 (two) times daily.     . [DISCONTINUED] metoprolol tartrate (LOPRESSOR) 25 MG tablet Take 1/2 tablet every other day  45 tablet 3   No current facility-administered medications for this visit.    Allergies  Allergen Reactions  . Codeine Nausea And Vomiting  . Inspra [Eplerenone] Other (See Comments)    Stomach problems  . Spironolactone Other (See Comments)    Gynecomastia  . Doxycycline Itching and Rash  . Sulfonamide Derivatives Itching and Rash    Review of Systems negative except from HPI and PMH  Physical Exam BP 120/68 mmHg  Pulse 71  Ht 5\' 10"  (1.778 m)  Wt 184 lb (83.462 kg)  BMI 26.40 kg/m2 Well developed and well nourished in no acute distress HENT normal E scleral and icterus clear Neck Supple JVP 7-8  +HJR carotids brisk and full Clear to ausculation  *Regular rate and rhythm, 2/6 systolic  Soft with active bowel sounds No clubbing cyanosis  1+ Edema Alert and oriented, grossly normal motor and sensory function Skin Warm and Dry  ECG demonstrates sinus rhythm P. synchronous  pacing and frequent (50%) PVCs We shortened the AV delay in the ECG today demonstrates P synchronous pacing with some evidence of fused ventricular beats.   Assessment and  Plan Congestive heart failure-acute on chronic-systolic  Ischemic cardiomyopathy  Hypertension-crisis-urgent  PVCs-frequent-new onset  Implantable defibrillator-Medtronic The patient's device was interrogated and the information was fully reviewed.  The device was not reprogrammed. Notably however, optivol was increased coincidental with loss of ventricular pacing which I interpret as the onset of ventricular ectopy  The cause of his acute decompensation is not clear but it is temporally related to the onset  of ventricular ectopy. The  hope would be that the heart failure cause of the ectopy the electrical mechanical interactions; it will be considerably harder to treat if  the PVCs are the culprit.  Given that I would like to 1) increase his diuretics from 60/40-80 twice a day for 3 days 2) increase his antihypertensives by having and change his hydralazine from 12-1/2 3 times a day to 25/25/12.5 3) Will obtain a 2-D echo to look at left ventricular function 4) reassess metabolic profile later this week 5) follow up with JH/LG/SK  in the next one to 2 weeks for        Patient Care Team: [name], MD as PCP - General (Family Medicine)   HPI  VASILI FOK is a 80 y.o. male Is referred by Dr. Cleta Alberts because of increasing shortness of breath.    He has a history of ischemic heart disease with ejection fractions ranging in a 15-30% range most recently assessed 2014. Over recent weeks he noted that his blood pressure he was not given an accurate measurements; he went to go see his PCP who noted that his weight was up and increase his diuretics. He also gave him 24 hour day oxygen in the context of his chronic obstructive lung disease.  he has urinated a little bit more briskly over the last couple of  days Is still short of breath and notes peripheral edema.  He denies chest discomfort.  There've been no changes in his medications apart from his diuretics.   Past Medical History  Diagnosis Date  . Chronic systolic heart failure     a. Chronic LV systolic failure. b. Echo in Dec 2011 showed EF of 25% - f/u echo 02/2011: Mild LVH, EF 15%, posterior lateral akinesis, inferior akinesis, grade 1 diastolic dysfunction, mild LAE. c. s/p BiV pacemaker 06/2011.  [name] Atrial flutter     a. Off amiodarone because of increased LFTs  . COPD (chronic obstructive pulmonary disease)     Chronic SOB, O2 dependent  . Myocardial infarction 1994  . Gastro - esophageal reflux   . Claudication   . BPH (benign prostatic hyperplasia)   . Coronary artery disease     a. Myocardial infarction in 1984,  99 and 07. b. H/o multiple RCA stents. c.  LHC 04/2011: without obstructive disease. oLM 25%, mLAD 30%, pCFX 25%, prox to mid RCA stent ok, dRCA 30% and 70%, pPDA 25%, mPL 25-30%, EF 25%.  [name] Hypertension   . Atrial fibrillation     a. On Xarelto.  . Hemorrhoids   . Hyperlipidemia     statin intolerant 2/2 myalgias  . Hx of adenomatous colonic polyps   . Hx of colonoscopy   . Chronic renal insufficiency   . Biventricular cardiac pacemaker in situ 3.2013  . NSVT (nonsustained ventricular tachycardia)     a. 04/2012- 6 beats on tele.  [name] Shortness of breath   . Arthritis     hips ,back ,hands  . Iron deficiency anemia, unspecified 03/20/2013    Past Surgical History  Procedure Laterality Date  . Cardiac catheterization  2008  . Cataract extraction    . Salivary gland surgery    . Coronary angioplasty with stent placement    . Insert / replace / remove pacemaker  06/16/2011  . Tee without cardioversion  05/08/2012    Procedure: TRANSESOPHAGEAL ECHOCARDIOGRAM (TEE);  Surgeon: Vesta Mixer, MD;  Location: [facility] END[facility];  Service: Cardiovascular;  Laterality: N/A;  .  Bi-ventricular pacemaker insertion N/A  06/16/2011    Procedure: BI-VENTRICULAR PACEMAKER INSERTION (CRT-P);  Surgeon: Duke Salvia, MD;  Location: [facility] CATH LAB;  Service: Cardiovascular;  Laterality: N/A;    Current Outpatient Prescriptions  Medication Sig Dispense Refill  . albuterol (PROAIR HFA) 108 (90 BASE) MCG/ACT inhaler Inhale 1 to 2 puffs everys 6 hours as needed (Patient taking differently: Inhale 1 to 2 puffs everys 6 hours as needed for wheezing) 3 each 0  . ALPRAZolam (XANAX) 0.25 MG tablet Take 0.25 mg by mouth daily as needed. For anxiety.    . carvedilol (COREG) 25 MG tablet TAKE 1/2 TABLET TWICE A DAY WITH MEALS --12.5 mg twice daily    . esomeprazole (NEXIUM) 40 MG capsule Take 40 mg by mouth 2 (two) times daily before a meal.    . fluticasone (FLONASE) 50 MCG/ACT nasal spray Place 2 sprays into both nostrils as needed for allergies. 16 g 11  . Fluticasone-Salmeterol (ADVAIR) 250-50 MCG/DOSE AEPB Inhale 1 puff into the lungs 2 (two) times daily.    . furosemide (LASIX) 80 MG tablet Take 1 tab in AM and 1/2 tab in PM.  Take extra 1/2 tablet for weight greater than 180lb. 45 tablet 3  . guaiFENesin (MUCINEX) 600 MG 12 hr tablet Take 600 mg by mouth 2 (two) times daily.    [name] HYDROcodone-acetaminophen (NORCO/VICODIN) 5-325 MG per tablet Take 1 tablet by mouth every 6 (six) hours. Arthritis pain    . ipratropium (ATROVENT) 0.02 % nebulizer solution Take 2.5 mLs (500 mcg total) by nebulization 3 (three) times daily. 75 mL 8  . meclizine (ANTIVERT) 25 MG tablet Take 12.5-25 mg by mouth 3 (three) times daily as needed for dizziness.    . nitroGLYCERIN (NITROSTAT) 0.4 MG SL tablet Place 1 tablet (0.4 mg total) under the tongue every 5 (five) minutes as needed for chest pain. 25 tablet 1  . NON FORMULARY Place 2 L into the nose daily as needed (Oxygen).     . potassium chloride SA (K-DUR,KLOR-CON) 20 MEQ tablet Take 1 tablet (20 mEq total) by mouth daily. 30 tablet 3  . rivaroxaban (XARELTO) 20 MG TABS tablet Take 1 tablet (20  mg total) by mouth daily with supper. 90 tablet 3  . rosuvastatin (CRESTOR) 5 MG tablet Take 1 tablet (5 mg total) by mouth every other day. 15 tablet 3  . sacubitril-valsartan (ENTRESTO) 24-26 MG Take 1 tablet by mouth 2 (two) times daily. 60 tablet 3  . Tamsulosin HCl (FLOMAX) 0.4 MG CAPS Take 0.4 mg by mouth 2 (two) times daily.     . [DISCONTINUED] metoprolol tartrate (LOPRESSOR) 25 MG tablet Take 1/2 tablet every other day  45 tablet 3   No current facility-administered medications for this visit.    Allergies  Allergen Reactions  . Codeine Nausea And Vomiting  . Inspra [Eplerenone] Other (See Comments)    Stomach problems  . Spironolactone Other (See Comments)    Gynecomastia  . Doxycycline Itching and Rash  . Sulfonamide Derivatives Itching and Rash    Review of Systems negative except from HPI and PMH  Physical Exam BP 120/68 mmHg  Pulse 71  Ht 5\' 10"  (1.778 m)  Wt 184 lb (83.462 kg)  BMI 26.40 kg/m2 Well developed and well nourished in no acute distress HENT normal E scleral and icterus clear Neck Supple JVP 7-8  +HJR carotids brisk and full Clear to ausculation  *Regular rate and rhythm, 2/6 systolic  Soft with active bowel  sounds No clubbing cyanosis  2+ Edema Alert and oriented, grossly normal motor and sensory function Skin Warm and Dry  ECG demonstrates sinus rhythm P. synchronous pacing and frequent (50%) PVCs. An ECG from last week demonstrated sinus rhythm also with PVCs and a pattern of bigeminy PVCs for left bundle intermediate axis.    Assessment and  Plan Congestive heart failure-acute on chronic-systolic  Ischemic cardiomyopathy  Hypertension-much improved  PVCs-frequent-new onset  Implantable defibrillator-Medtronic The patient's device was interrogated and the information was fully reviewed.  It was further noted that he was having frequent sensed ventricular events related to beats falling into ventricular refractoriness and thus being  not tract. There was no evidence of VA conduction and so his T4 was shortened and the atrial tracking response was activated. We also shortened the AV delay from 150/1:30 to 1:30/110 in the hopes of overriding refused PVCs.  He still continued to have some PVCs not withstanding the above and the increase in his rate from 50--70 is a we have added Ranexa 500 mg twice daily. It is also possible that ischemia is contributing to the ventricular ectopy. An catheterization may be indicated.

## 2014-03-15 NOTE — Patient Instructions (Addendum)
Your physician has recommended you make the following change in your medication:  1) START Ranexa 500 mg twice a day  Your physician recommends that you schedule a follow-up appointment in: 3 months with Dr. Graciela HusbandsKlein  Ranolazine tablets What is this medicine? RANOLAZINE (ra NOE la zeen) is a heart medicine. It is used to treat chronic chest pain (angina). This medicine must be taken regularly. It will not relieve an acute episode of chest pain. This medicine may be used for other purposes; ask your health care provider or pharmacist if you have questions. COMMON BRAND NAME(S): Ranexa What should I tell my health care provider before I take this medicine? They need to know if you have any of these conditions: -heart disease -irregular heartbeat -kidney disease -liver disease -low levels of potassium or magnesium in the blood -an unusual or allergic reaction to ranolazine, other medicines, foods, dyes, or preservatives -pregnant or trying to get pregnant -breast-feeding How should I use this medicine? Take this medicine by mouth with a glass of water. Follow the directions on the prescription label. Do not cut, crush, or chew this medicine. Take with or without food. Do not take this medication with grapefruit juice. Take your doses at regular intervals. Do not take your medicine more often then directed. Talk to your pediatrician regarding the use of this medicine in children. Special care may be needed. Overdosage: If you think you have taken too much of this medicine contact a poison control center or emergency room at once. NOTE: This medicine is only for you. Do not share this medicine with others. What if I miss a dose? If you miss a dose, take it as soon as you can. If it is almost time for your next dose, take only that dose. Do not take double or extra doses. What may interact with this medicine? Do not take this medicine with any of the following medications: -antivirals for HIV or  AIDS -cerivastatin -certain antibiotics like chloramphenicol, clarithromycin, dalfopristin; quinupristin, isoniazid, rifabutin, rifampin, rifapentine -certain medicines used for cancer like imatinib, nilotinib -certain medicines for fungal infections like fluconazole, itraconazole, ketoconazole, posaconazole, voriconazole -certain medicines for irregular heart beat like dofetilide, dronedarone -certain medicines for seizures like carbamazepine, fosphenytoin, oxcarbazepine, phenobarbital, phenytoin -cisapride -conivaptan -cyclosporine -grapefruit or grapefruit juice -nefazodone -pimozide -quinacrine -St John's wort -thioridazine -ziprasidone This medicine may also interact with the following medications: -alfuzosin -certain medicines for depression, anxiety, or psychotic disturbances like bupropion, citalopram, fluoxetine, fluphenazine, paroxetine, perphenazine, risperidone, sertraline, trifluoperazine -certain medicines for cholesterol like atorvastatin, lovastatin, simvastatin -certain medicines for stomach problems like octreotide, palonosetron, prochlorperazine -eplerenone -ergot alkaloids like dihydroergotamine, ergonovine, ergotamine, methylergonovine -metformin -nicardipine -other medicines that prolong the QT interval (cause an abnormal heart rhythm) -sirolimus -tacrolimus This list may not describe all possible interactions. Give your health care provider a list of all the medicines, herbs, non-prescription drugs, or dietary supplements you use. Also tell them if you smoke, drink alcohol, or use illegal drugs. Some items may interact with your medicine. What should I watch for while using this medicine? Visit your doctor for regular check ups. Tell your doctor or healthcare professional if your symptoms do not start to get better or if they get worse. This medicine will not relieve an acute attack of angina or chest pain. This medicine can change your heart rhythm. Your health  care provider may check your heart rhythm by ordering an electrocardiogram (ECG) while you are taking this medicine. You may get drowsy or dizzy. Do not  drive, use machinery, or do anything that needs mental alertness until you know how this medicine affects you. Do not stand or sit up quickly, especially if you are an older patient. This reduces the risk of dizzy or fainting spells. Alcohol may interfere with the effect of this medicine. Avoid alcoholic drinks. If you are scheduled for any medical or dental procedure, tell your healthcare provider that you are taking this medicine. This medicine can interact with other medicines used during surgery. What side effects may I notice from receiving this medicine? Side effects that you should report to your doctor or health care professional as soon as possible: -allergic reactions like skin rash, itching or hives, swelling of the face, lips, or tongue -breathing problems -changes in vision -fast, irregular or pounding heartbeat -feeling faint or lightheaded, falls -low or high blood pressure -numbness or tingling feelings -ringing in the ears -tremor or shakiness -slow heartbeat (fewer than 50 beats per minute) -swelling of the legs or feet Side effects that usually do not require medical attention (report to your prescriber or health care professional if they continue or are bothersome): -constipation -drowsy -dry mouth -headache -nausea or vomiting -stomach upset This list may not describe all possible side effects. Call your doctor for medical advice about side effects. You may report side effects to FDA at 1-800-FDA-1088. Where should I keep my medicine? Keep out of the reach of children. Store at room temperature between 15 and 30 degrees C (59 and 86 degrees F). Throw away any unused medicine after the expiration date. NOTE: This sheet is a summary. It may not cover all possible information. If you have questions about this medicine,  talk to your doctor, pharmacist, or health care provider.  2015, [employer]vier/[employer] Standard. (2012-10-16 13:40:32)

## 2014-03-18 ENCOUNTER — Other Ambulatory Visit: Payer: Self-pay | Admitting: *Deleted

## 2014-03-18 ENCOUNTER — Telehealth: Payer: Self-pay | Admitting: Internal Medicine

## 2014-03-18 ENCOUNTER — Telehealth: Payer: Self-pay | Admitting: Hematology & Oncology

## 2014-03-18 ENCOUNTER — Other Ambulatory Visit: Payer: Self-pay

## 2014-03-18 ENCOUNTER — Telehealth: Payer: Self-pay | Admitting: *Deleted

## 2014-03-18 DIAGNOSIS — I5022 Chronic systolic (congestive) heart failure: Secondary | ICD-10-CM

## 2014-03-18 DIAGNOSIS — D649 Anemia, unspecified: Secondary | ICD-10-CM

## 2014-03-18 MED ORDER — FUROSEMIDE 80 MG PO TABS: ORAL_TABLET | ORAL | Status: DC

## 2014-03-18 NOTE — Telephone Encounter (Signed)
Wife called wanting him to be seen in next day or so, he is not feeling well. I transferred to RN for triage

## 2014-03-18 NOTE — Telephone Encounter (Signed)
Wife called stating patient was lethargic and more easily winded, similar to other times when patient needed iron infusion. The next appointment is one month away. Will schedule a lab draw this week to assess patient's hgb and iron levels to see if we need to see pt earlier. Wife is in agreement with plan.

## 2014-03-18 NOTE — Telephone Encounter (Signed)
New message      Since taking the ranaxa---his urine is dark yellow---not bloody but a funny color.  Could this be from the medication?

## 2014-03-18 NOTE — Telephone Encounter (Signed)
Pt was made aware that Ranexa does not affects the color or smell of urine. Pt is aware that he  maybe dry and needs to increased that fluid intake. Pt verbalized understanding.

## 2014-03-18 NOTE — Telephone Encounter (Signed)
Wife aware of 12-16 lab she didn't want to do it 12-15

## 2014-03-20 ENCOUNTER — Other Ambulatory Visit: Payer: Self-pay

## 2014-03-20 ENCOUNTER — Other Ambulatory Visit (HOSPITAL_BASED_OUTPATIENT_CLINIC_OR_DEPARTMENT_OTHER): Payer: Medicare Other

## 2014-03-20 DIAGNOSIS — D649 Anemia, unspecified: Secondary | ICD-10-CM

## 2014-03-20 DIAGNOSIS — D509 Iron deficiency anemia, unspecified: Secondary | ICD-10-CM

## 2014-03-20 DIAGNOSIS — I5022 Chronic systolic (congestive) heart failure: Secondary | ICD-10-CM

## 2014-03-20 LAB — CBC WITH DIFFERENTIAL/PLATELET
BASO%: 0.4 % (ref 0.0–2.0)
BASOS ABS: 0 10*3/uL (ref 0.0–0.1)
EOS%: 3.2 % (ref 0.0–7.0)
Eosinophils Absolute: 0.2 10*3/uL (ref 0.0–0.5)
HEMATOCRIT: 39.4 % (ref 38.4–49.9)
HEMOGLOBIN: 13.1 g/dL (ref 13.0–17.1)
LYMPH%: 10.6 % — ABNORMAL LOW (ref 14.0–49.0)
MCH: 31.8 pg (ref 27.2–33.4)
MCHC: 33.2 g/dL (ref 32.0–36.0)
MCV: 95.6 fL (ref 79.3–98.0)
MONO#: 0.9 10*3/uL (ref 0.1–0.9)
MONO%: 12.5 % (ref 0.0–14.0)
NEUT#: 5 10*3/uL (ref 1.5–6.5)
NEUT%: 73.3 % (ref 39.0–75.0)
Platelets: 213 10*3/uL (ref 140–400)
RBC: 4.12 10*6/uL — ABNORMAL LOW (ref 4.20–5.82)
RDW: 13.4 % (ref 11.0–14.6)
WBC: 6.8 10*3/uL (ref 4.0–10.3)
lymph#: 0.7 10*3/uL — ABNORMAL LOW (ref 0.9–3.3)

## 2014-03-20 LAB — IRON AND TIBC CHCC
%SAT: 28 % (ref 20–55)
Iron: 42 ug/dL (ref 42–163)
TIBC: 153 ug/dL — ABNORMAL LOW (ref 202–409)
UIBC: 111 ug/dL — ABNORMAL LOW (ref 117–376)

## 2014-03-20 LAB — FERRITIN CHCC: FERRITIN: 431 ng/mL — AB (ref 22–316)

## 2014-03-20 MED ORDER — FUROSEMIDE 40 MG PO TABS: ORAL_TABLET | ORAL | Status: DC

## 2014-03-20 MED ORDER — FUROSEMIDE 80 MG PO TABS: ORAL_TABLET | ORAL | Status: DC

## 2014-03-22 ENCOUNTER — Telehealth (HOSPITAL_COMMUNITY): Payer: Self-pay | Admitting: Vascular Surgery

## 2014-03-22 ENCOUNTER — Other Ambulatory Visit: Payer: Self-pay

## 2014-03-22 DIAGNOSIS — I5022 Chronic systolic (congestive) heart failure: Secondary | ICD-10-CM

## 2014-03-22 MED ORDER — FUROSEMIDE 80 MG PO TABS
ORAL_TABLET | ORAL | Status: AC
Start: 2014-03-22 — End: ?

## 2014-03-22 MED ORDER — FUROSEMIDE 40 MG PO TABS: ORAL_TABLET | ORAL | Status: AC

## 2014-03-22 NOTE — Telephone Encounter (Signed)
pt called he received a email from [facility] about his potassium prescription.. The email states that someone needs to call to auth prescription.. Please advise [phone]

## 2014-03-25 ENCOUNTER — Telehealth: Payer: Self-pay | Admitting: Cardiology

## 2014-03-25 NOTE — Telephone Encounter (Signed)
New message     Question regarding furosemide dosage

## 2014-03-27 ENCOUNTER — Encounter: Payer: Self-pay | Admitting: Cardiology

## 2014-03-27 ENCOUNTER — Telehealth (HOSPITAL_COMMUNITY): Payer: Self-pay | Admitting: *Deleted

## 2014-03-27 ENCOUNTER — Telehealth (HOSPITAL_COMMUNITY): Payer: Self-pay | Admitting: Vascular Surgery

## 2014-03-27 ENCOUNTER — Ambulatory Visit (INDEPENDENT_AMBULATORY_CARE_PROVIDER_SITE_OTHER): Payer: Medicare Other | Admitting: Cardiology

## 2014-03-27 VITALS — BP 160/80 | HR 71 | Ht 70.0 in | Wt 184.6 lb

## 2014-03-27 DIAGNOSIS — I5023 Acute on chronic systolic (congestive) heart failure: Secondary | ICD-10-CM

## 2014-03-27 DIAGNOSIS — I480 Paroxysmal atrial fibrillation: Secondary | ICD-10-CM

## 2014-03-27 NOTE — Telephone Encounter (Signed)
Will forward to provider for med change orders

## 2014-03-27 NOTE — Telephone Encounter (Signed)
entresto was approved 03/16/14

## 2014-03-27 NOTE — Patient Instructions (Signed)
Your physician recommends that you schedule a follow-up appointment in: as needed with Dr. Antoine PocheHochrein  We are going to get you set up with the heart failure clinic

## 2014-03-27 NOTE — Progress Notes (Signed)
HPI The patient presents for followup of his cardiomyopathy. Since I last saw him he has been followed by the CHF clinic.   He also saw Dr. Graciela Husbands.  He has had Entresto added to his meds.  He has had Ranexa added as well.  When he saw Dr. Graciela Husbands.  He was started on Ranexa because he had ventricular ectopy it was thought possibly to be ischemic. His device was reprogrammed as well. However, patient doesn't think he feels well with this addition. He feels lightheaded. He was more fatigued. He thinks his breathing overall has been fine.  He hasn't felt any palpitations and has had no presyncope or syncope. He has had no chest pressure, neck or arm discomfort. He's had some chronic lower extremity swelling.  Allergies  Allergen Reactions  . Codeine Nausea And Vomiting  . Inspra [Eplerenone] Other (See Comments)    Stomach problems  . Spironolactone Other (See Comments)    Gynecomastia  . Doxycycline Itching and Rash  . Sulfonamide Derivatives Itching and Rash    Current Outpatient Prescriptions  Medication Sig Dispense Refill  . albuterol (PROAIR HFA) 108 (90 BASE) MCG/ACT inhaler Inhale 1 to 2 puffs everys 6 hours as needed (Patient taking differently: Inhale 1 to 2 puffs everys 6 hours as needed for wheezing) 3 each 0  . ALPRAZolam (XANAX) 0.25 MG tablet Take 0.25 mg by mouth daily as needed. For anxiety.    . carvedilol (COREG) 25 MG tablet TAKE 1/2 TABLET TWICE A DAY WITH MEALS --12.5 mg twice daily    . esomeprazole (NEXIUM) 40 MG capsule Take 40 mg by mouth 2 (two) times daily before a meal.    . fluticasone (FLONASE) 50 MCG/ACT nasal spray Place 2 sprays into both nostrils as needed for allergies. 16 g 11  . Fluticasone-Salmeterol (ADVAIR) 250-50 MCG/DOSE AEPB Inhale 1 puff into the lungs 2 (two) times daily.    . furosemide (LASIX) 40 MG tablet Take 40 mg by mouth in the pm.Take 1 extra tablet for weight greater than 180lb. 135 tablet 2  . furosemide (LASIX) 80 MG tablet Take 1 tab by  mouth  Daily (80 mg)  in AM 90 tablet 2  . guaiFENesin (MUCINEX) 600 MG 12 hr tablet Take 600 mg by mouth 2 (two) times daily.    [name] HYDROcodone-acetaminophen (NORCO/VICODIN) 5-325 MG per tablet Take 1 tablet by mouth every 6 (six) hours. Arthritis pain    . ipratropium (ATROVENT) 0.02 % nebulizer solution Take 2.5 mLs (500 mcg total) by nebulization 3 (three) times daily. 75 mL 8  . meclizine (ANTIVERT) 25 MG tablet Take 12.5-25 mg by mouth 3 (three) times daily as needed for dizziness.    . nitroGLYCERIN (NITROSTAT) 0.4 MG SL tablet Place 1 tablet (0.4 mg total) under the tongue every 5 (five) minutes as needed for chest pain. 25 tablet 1  . NON FORMULARY Place 2 L into the nose daily as needed (Oxygen).     . potassium chloride SA (K-DUR,KLOR-CON) 20 MEQ tablet Take 1 tablet (20 mEq total) by mouth daily. 30 tablet 3  . ranolazine (RANEXA) 500 MG 12 hr tablet Take 1 tablet (500 mg total) by mouth 2 (two) times daily. 60 tablet 3  . rivaroxaban (XARELTO) 20 MG TABS tablet Take 1 tablet (20 mg total) by mouth daily with supper. 90 tablet 3  . sacubitril-valsartan (ENTRESTO) 24-26 MG Take 1 tablet by mouth 2 (two) times daily. 60 tablet 3  . Tamsulosin HCl (FLOMAX) 0.4  MG CAPS Take 0.4 mg by mouth 2 (two) times daily.     . [DISCONTINUED] metoprolol tartrate (LOPRESSOR) 25 MG tablet Take 1/2 tablet every other day  45 tablet 3   No current facility-administered medications for this visit.    Past Medical History  Diagnosis Date  . Chronic systolic heart failure     a. Chronic LV systolic failure. b. Echo in Dec 2011 showed EF of 25% - f/u echo 02/2011: Mild LVH, EF 15%, posterior lateral akinesis, inferior akinesis, grade 1 diastolic dysfunction, mild LAE. c. s/p BiV pacemaker 06/2011.  [name]. Atrial flutter     a. Off amiodarone because of increased LFTs  . COPD (chronic obstructive pulmonary disease)     Chronic SOB, O2 dependent  . Myocardial infarction 1994  . Gastro - esophageal reflux   .  Claudication   . BPH (benign prostatic hyperplasia)   . Coronary artery disease     a. Myocardial infarction in 1984,  99 and 07. b. H/o multiple RCA stents. c.  LHC 04/2011: without obstructive disease. oLM 25%, mLAD 30%, pCFX 25%, prox to mid RCA stent ok, dRCA 30% and 70%, pPDA 25%, mPL 25-30%, EF 25%.  [name]. Hypertension   . Atrial fibrillation     a. On Xarelto.  . Hemorrhoids   . Hyperlipidemia     statin intolerant 2/2 myalgias  . Hx of adenomatous colonic polyps   . Hx of colonoscopy   . Chronic renal insufficiency   . Biventricular cardiac pacemaker in situ 3.2013  . NSVT (nonsustained ventricular tachycardia)     a. 04/2012- 6 beats on tele.  [name]. Shortness of breath   . Arthritis     hips ,back ,hands  . Iron deficiency anemia, unspecified 03/20/2013    Past Surgical History  Procedure Laterality Date  . Cardiac catheterization  2008  . Cataract extraction    . Salivary gland surgery    . Coronary angioplasty with stent placement    . Insert / replace / remove pacemaker  06/16/2011  . Tee without cardioversion  05/08/2012    Procedure: TRANSESOPHAGEAL ECHOCARDIOGRAM (TEE);  Surgeon: Vesta MixerPhilip J Nahser, MD;  [facility];  Service: Cardiovascular;  Laterality: N/A;  . Bi-ventricular pacemaker insertion N/A 06/16/2011    Procedure: BI-VENTRICULAR PACEMAKER INSERTION (CRT-P);  Surgeon: Duke SalviaSteven C Klein, MD;  [facility];  Service: Cardiovascular;  Laterality: N/A;    ROS:  As stated in the HPI and negative for all other systems.  PHYSICAL EXAM BP 160/80 mmHg  Pulse 71  Ht 5\' 10"  (1.778 m)  Wt 184 lb 9.6 oz (83.734 kg)  BMI 26.49 kg/m2 GENERAL: Frail appearing  HEENT: Pupils equal round and reactive, fundi not visualized, oral mucosa unremarkable, dentures  NECK:  Jugular venous distention 10 cm, waveform within normal limits, carotid upstroke brisk and symmetric, left carotid bruit, no thyromegaly  LYMPHATICS: No cervical, inguinal adenopathy  LUNGS: Clear to  auscultation bilaterally  BACK: No CVA tenderness  CHEST: Well healed ICD scar HEART: PMI not displaced or sustained,S1 and S2 within normal limits, no S3, no S4, no clicks, no rubs, no murmurs  ABD: Flat, positive bowel sounds normal in frequency in pitch, no bruits, no rebound, no guarding, no midline pulsatile mass, no hepatomegaly, no splenomegaly  EXT: 2 plus pulses throughout, mild ankle edema, no cyanosis no clubbing, chronic venous stasis changes.  SKIN: Bruising  EKG:  Sinus rhythm with ventricular pacing. Rate 71. 03/27/2014   ASSESSMENT AND PLAN  Cardiomyopathy -  He seems to be euvolemic.  He reports feeling increased fatigue he thinks related to the Renexa.  He otherwise seems to be euvolemic and doing well. There are multiple people involved in his care and I have suggested that he continue with the heart failure clinic and I would be happy to see him only if needed. I did however stop his Ranexa as he was not describing any angina. I reviewed extensively recent office notes to understand his multiple visits and change to his care.  Of note his ejection fraction in July of about 35%.  (Greater than 25 minutes reviewing all data with greater than 50% face to face with the patient).  Hypertension -  The blood pressure is at target. No change in medications is indicated. We will continue with therapeutic lifestyle changes (TLC).  Atrial fibrillation -  He will continue the meds as listed. He's had no symptomatic paroxysms.  Carotid bruit  - He has a very soft left-sided bruit I didn't appreciate this before. However, carotid Doppler demonstrated no significant disease.

## 2014-03-27 NOTE — Telephone Encounter (Signed)
Patient called with questions about his Medication. He received a letter from [facility] asking him to contact his dr's office for refills in which he believes to be Potassium Chloride. 2 days ago he also called about dosage of his furosemide. He wants to further discuss with his wife b/c "it's a little too confusing at the moment" because he was sitting in his car. I will forward this message to Dr Antoine PocheHochrein, he has an [facility] this [facility]

## 2014-03-27 NOTE — Telephone Encounter (Signed)
Pt got letter from [facility] stating his Crestor was not approved, pt states he does not want to take the Crestor anymore because it makes his body hurt.[name]. PLEASE ADVISE

## 2014-03-27 NOTE — Telephone Encounter (Signed)
Called [phone] (pt's ID [id number])and completed PA for pt's Sherryll Burgerntresto, med was approved 02/25/14-03/28/15, [place] [id number], [facility] and pt are both aware.

## 2014-03-27 NOTE — Telephone Encounter (Signed)
Leta JunglingJake - are you managing his lipids or do you want me to address? Thanks - dan

## 2014-03-28 ENCOUNTER — Ambulatory Visit: Payer: Medicare Other | Admitting: Cardiology

## 2014-03-28 ENCOUNTER — Encounter (HOSPITAL_COMMUNITY): Payer: Medicare Other

## 2014-03-28 NOTE — Telephone Encounter (Signed)
I will handle thanks.

## 2014-04-01 ENCOUNTER — Telehealth: Payer: Self-pay | Admitting: Cardiology

## 2014-04-01 NOTE — Telephone Encounter (Signed)
Left message for patient to call regarding appointment with CHF clinic.  Patient is established there and has a follow up on 04/10/14.

## 2014-04-01 NOTE — Telephone Encounter (Signed)
Patient called back and stated that he is aware of the appointment for 04/10/14 at the heart failure clinic.

## 2014-04-02 ENCOUNTER — Encounter (HOSPITAL_COMMUNITY): Payer: Medicare Other

## 2014-04-09 ENCOUNTER — Telehealth: Payer: Self-pay | Admitting: Hematology & Oncology

## 2014-04-09 NOTE — Telephone Encounter (Signed)
Wife called said pt still was not feeling well (no one called with lab results) and wanted to be seen sooner, I scheduled for 1-6. I left RN message

## 2014-04-10 ENCOUNTER — Ambulatory Visit: Payer: Self-pay

## 2014-04-10 ENCOUNTER — Ambulatory Visit (HOSPITAL_COMMUNITY)
Admission: RE | Admit: 2014-04-10 | Discharge: 2014-04-10 | Disposition: A | Discharge status: Home / Self Care | Payer: Medicare Other | Source: Ambulatory Visit | Attending: Internal Medicine | Admitting: Internal Medicine

## 2014-04-10 ENCOUNTER — Ambulatory Visit (HOSPITAL_BASED_OUTPATIENT_CLINIC_OR_DEPARTMENT_OTHER): Payer: 59 | Admitting: Family

## 2014-04-10 ENCOUNTER — Other Ambulatory Visit (HOSPITAL_BASED_OUTPATIENT_CLINIC_OR_DEPARTMENT_OTHER): Payer: 59 | Admitting: Lab

## 2014-04-10 VITALS — BP 112/62 | HR 75 | Wt 180.0 lb

## 2014-04-10 DIAGNOSIS — Z882 Allergy status to sulfonamides status: Secondary | ICD-10-CM | POA: Diagnosis not present

## 2014-04-10 DIAGNOSIS — I255 Ischemic cardiomyopathy: Secondary | ICD-10-CM | POA: Insufficient documentation

## 2014-04-10 DIAGNOSIS — K219 Gastro-esophageal reflux disease without esophagitis: Secondary | ICD-10-CM | POA: Diagnosis not present

## 2014-04-10 DIAGNOSIS — I252 Old myocardial infarction: Secondary | ICD-10-CM | POA: Diagnosis not present

## 2014-04-10 DIAGNOSIS — Z79899 Other long term (current) drug therapy: Secondary | ICD-10-CM | POA: Insufficient documentation

## 2014-04-10 DIAGNOSIS — D509 Iron deficiency anemia, unspecified: Secondary | ICD-10-CM | POA: Insufficient documentation

## 2014-04-10 DIAGNOSIS — Z950 Presence of cardiac pacemaker: Secondary | ICD-10-CM | POA: Diagnosis not present

## 2014-04-10 DIAGNOSIS — I4892 Unspecified atrial flutter: Secondary | ICD-10-CM | POA: Insufficient documentation

## 2014-04-10 DIAGNOSIS — I129 Hypertensive chronic kidney disease with stage 1 through stage 4 chronic kidney disease, or unspecified chronic kidney disease: Secondary | ICD-10-CM | POA: Insufficient documentation

## 2014-04-10 DIAGNOSIS — I5022 Chronic systolic (congestive) heart failure: Secondary | ICD-10-CM | POA: Diagnosis present

## 2014-04-10 DIAGNOSIS — Z87891 Personal history of nicotine dependence: Secondary | ICD-10-CM | POA: Diagnosis not present

## 2014-04-10 DIAGNOSIS — Z7901 Long term (current) use of anticoagulants: Secondary | ICD-10-CM | POA: Diagnosis not present

## 2014-04-10 DIAGNOSIS — Z888 Allergy status to other drugs, medicaments and biological substances status: Secondary | ICD-10-CM | POA: Insufficient documentation

## 2014-04-10 DIAGNOSIS — E785 Hyperlipidemia, unspecified: Secondary | ICD-10-CM | POA: Insufficient documentation

## 2014-04-10 DIAGNOSIS — N189 Chronic kidney disease, unspecified: Secondary | ICD-10-CM | POA: Insufficient documentation

## 2014-04-10 DIAGNOSIS — J441 Chronic obstructive pulmonary disease with (acute) exacerbation: Secondary | ICD-10-CM

## 2014-04-10 DIAGNOSIS — Z885 Allergy status to narcotic agent status: Secondary | ICD-10-CM | POA: Insufficient documentation

## 2014-04-10 DIAGNOSIS — Z9981 Dependence on supplemental oxygen: Secondary | ICD-10-CM | POA: Diagnosis not present

## 2014-04-10 DIAGNOSIS — I2510 Atherosclerotic heart disease of native coronary artery without angina pectoris: Secondary | ICD-10-CM | POA: Insufficient documentation

## 2014-04-10 DIAGNOSIS — I480 Paroxysmal atrial fibrillation: Secondary | ICD-10-CM

## 2014-04-10 DIAGNOSIS — N183 Chronic kidney disease, stage 3 unspecified: Secondary | ICD-10-CM

## 2014-04-10 DIAGNOSIS — I4891 Unspecified atrial fibrillation: Secondary | ICD-10-CM | POA: Diagnosis not present

## 2014-04-10 LAB — CBC WITH DIFFERENTIAL (CANCER CENTER ONLY)
BASO#: 0 10*3/uL (ref 0.0–0.2)
BASO%: 0.2 % (ref 0.0–2.0)
EOS%: 4.7 % (ref 0.0–7.0)
Eosinophils Absolute: 0.3 10*3/uL (ref 0.0–0.5)
HEMATOCRIT: 40.2 % (ref 38.7–49.9)
HEMOGLOBIN: 13.3 g/dL (ref 13.0–17.1)
LYMPH#: 0.8 10*3/uL — ABNORMAL LOW (ref 0.9–3.3)
LYMPH%: 12.7 % — ABNORMAL LOW (ref 14.0–48.0)
MCH: 32.1 pg (ref 28.0–33.4)
MCHC: 33.1 g/dL (ref 32.0–35.9)
MCV: 97 fL (ref 82–98)
MONO#: 0.7 10*3/uL (ref 0.1–0.9)
MONO%: 11.5 % (ref 0.0–13.0)
NEUT#: 4.3 10*3/uL (ref 1.5–6.5)
NEUT%: 70.9 % (ref 40.0–80.0)
PLATELETS: 219 10*3/uL (ref 145–400)
RBC: 4.14 10*6/uL — ABNORMAL LOW (ref 4.20–5.70)
RDW: 13.6 % (ref 11.1–15.7)
WBC: 6 10*3/uL (ref 4.0–10.0)

## 2014-04-10 LAB — CHCC SATELLITE - SMEAR

## 2014-04-10 LAB — IRON AND TIBC CHCC
%SAT: 36 % (ref 20–55)
IRON: 56 ug/dL (ref 42–163)
TIBC: 156 ug/dL — ABNORMAL LOW (ref 202–409)
UIBC: 101 ug/dL — AB (ref 117–376)

## 2014-04-10 LAB — RETICULOCYTES (CHCC)
ABS RETIC: 32.9 10*3/uL (ref 19.0–186.0)
RBC.: 4.11 MIL/uL — ABNORMAL LOW (ref 4.22–5.81)
Retic Ct Pct: 0.8 % (ref 0.4–2.3)

## 2014-04-10 LAB — FERRITIN CHCC: Ferritin: 428 ng/ml — ABNORMAL HIGH (ref 22–316)

## 2014-04-10 NOTE — Progress Notes (Signed)
[facility]  [phone] [phone]  ID: Andrew [name] OB: 30-Oct-1930 MR#: [id number]#:[id number] Patient Care Team: [name], MD as PCP - General (Family Medicine)  DIAGNOSIS: Iron deficiency anemia  Erythropoitin deficiency  INTERVAL HISTORY: Andrew [name] is here today with his wife for follow-up. He is doing well. He has plenty of energy, no c/o fatigue.  He has some SOB with exertion due to CHF and COPD. He also has chronic arthritic pain in his back and hips. He denies fever, chills, n/v, rash, headache, dizziness, blurred vision, chest pain, palpitations, abdominal pain, constipation, diarrhea, blood in urine or stool.  He has swelling in his ankles at times and takes lasix daily. No tenderness, numbness or tingling in his extremities.  His appetite is much better and he is drinking plenty of fluids.   CURRENT TREATMENT: IV iron as indicated  REVIEW OF SYSTEMS: All other 10 point review of systems is negative except for those issues mentioned above.  PAST MEDICAL HISTORY: Past Medical History  Diagnosis Date  . Chronic systolic heart failure     a. Chronic LV systolic failure. b. Echo in Dec 2011 showed EF of 25% - f/u echo 02/2011: Mild LVH, EF 15%, posterior lateral akinesis, inferior akinesis, grade 1 diastolic dysfunction, mild LAE. c. s/p BiV pacemaker 06/2011.  [name] Atrial flutter     a. Off amiodarone because of increased LFTs  . COPD (chronic obstructive pulmonary disease)     Chronic SOB, O2 dependent  . Myocardial infarction 1994  . Gastro - esophageal reflux   . Claudication   . BPH (benign prostatic hyperplasia)   . Coronary artery disease     a. Myocardial infarction in 1984,  99 and 07. b. H/o multiple RCA stents. c.  LHC 04/2011: without obstructive disease. oLM 25%, mLAD 30%, pCFX 25%, prox to mid RCA stent ok, dRCA 30% and 70%, pPDA 25%, mPL 25-30%, EF 25%.  [name] Hypertension   . Atrial fibrillation     a. On Xarelto.  .  Hemorrhoids   . Hyperlipidemia     statin intolerant 2/2 myalgias  . Hx of adenomatous colonic polyps   . Hx of colonoscopy   . Chronic renal insufficiency   . Biventricular cardiac pacemaker in situ 3.2013  . NSVT (nonsustained ventricular tachycardia)     a. 04/2012- 6 beats on tele.  [name] Shortness of breath   . Arthritis     hips ,back ,hands  . Iron deficiency anemia, unspecified 03/20/2013   PAST SURGICAL HISTORY: Past Surgical History  Procedure Laterality Date  . Cardiac catheterization  2008  . Cataract extraction    . Salivary gland surgery    . Coronary angioplasty with stent placement    . Insert / replace / remove pacemaker  06/16/2011  . Tee without cardioversion  05/08/2012    Procedure: TRANSESOPHAGEAL ECHOCARDIOGRAM (TEE);  Surgeon: Vesta Mixer, MD;  Location: [facility] ENDOSCOPY;  Service: Cardiovascular;  Laterality: N/A;  . Bi-ventricular pacemaker insertion N/A 06/16/2011    Procedure: BI-VENTRICULAR PACEMAKER INSERTION (CRT-P);  Surgeon: Duke Salvia, MD;  Location: [facility] CATH LAB;  Service: Cardiovascular;  Laterality: N/A;   FAMILY HISTORY Family History  Problem Relation Age of Onset  . Coronary artery disease Father   . Heart disease Mother   . Colon cancer Neg Hx   . Heart disease Brother    GYNECOLOGIC HISTORY:  No LMP for male patient.   SOCIAL HISTORY:  History  Social History  . Marital Status: Married    Spouse Name: N/A    Number of Children: N/A  . Years of Education: N/A   Occupational History  . retired Designer, industrial/productadministrative work    Social History Main Topics  . Smoking status: Former Smoker -- 1.50 packs/day for 55 years    Types: Cigarettes    Start date: 05/21/1954    Quit date: 01/03/2009  . Smokeless tobacco: Never Used     Comment: quit 4 years ago  . Alcohol Use: No     Comment: occasional  . Drug Use: No  . Sexual Activity: Not Currently   Other Topics Concern  . Not on file   Social History Narrative   ADVANCED DIRECTIVES:  <no information>  HEALTH MAINTENANCE: History  Substance Use Topics  . Smoking status: Former Smoker -- 1.50 packs/day for 55 years    Types: Cigarettes    Start date: 05/21/1954    Quit date: 01/03/2009  . Smokeless tobacco: Never Used     Comment: quit 4 years ago  . Alcohol Use: No     Comment: occasional   Colonoscopy: PAP: Bone density: Lipid panel:  Allergies  Allergen Reactions  . Codeine Nausea And Vomiting  . Inspra [Eplerenone] Other (See Comments)    Stomach problems  . Spironolactone Other (See Comments)    Gynecomastia  . Doxycycline Itching and Rash  . Sulfonamide Derivatives Itching and Rash   Current Outpatient Prescriptions  Medication Sig Dispense Refill  . albuterol (PROAIR HFA) 108 (90 BASE) MCG/ACT inhaler Inhale 1 to 2 puffs everys 6 hours as needed (Patient taking differently: Inhale 1 to 2 puffs everys 6 hours as needed for wheezing) 3 each 0  . ALPRAZolam (XANAX) 0.25 MG tablet Take 0.25 mg by mouth daily as needed. For anxiety.    . carvedilol (COREG) 25 MG tablet TAKE 1/2 TABLET TWICE A DAY WITH MEALS --12.5 mg twice daily    . esomeprazole (NEXIUM) 40 MG capsule Take 40 mg by mouth 2 (two) times daily before a meal.    . fluticasone (FLONASE) 50 MCG/ACT nasal spray Place 2 sprays into both nostrils as needed for allergies. 16 g 11  . Fluticasone-Salmeterol (ADVAIR) 250-50 MCG/DOSE AEPB Inhale 1 puff into the lungs 2 (two) times daily.    . furosemide (LASIX) 40 MG tablet Take 40 mg by mouth in the pm.Take 1 extra tablet for weight greater than 180lb. 135 tablet 2  . furosemide (LASIX) 80 MG tablet Take 1 tab by mouth  Daily (80 mg)  in AM 90 tablet 2  . guaiFENesin (MUCINEX) 600 MG 12 hr tablet Take 600 mg by mouth 2 (two) times daily.    [name]. HYDROcodone-acetaminophen (NORCO/VICODIN) 5-325 MG per tablet Take 1 tablet by mouth every 6 (six) hours. Arthritis pain    . ipratropium (ATROVENT) 0.02 % nebulizer solution Take 2.5 mLs (500 mcg total) by  nebulization 3 (three) times daily. (Patient taking differently: Take 0.5 mg by nebulization 3 (three) times daily. ) 75 mL 8  . meclizine (ANTIVERT) 25 MG tablet Take 12.5-25 mg by mouth 3 (three) times daily as needed for dizziness.    . nitroGLYCERIN (NITROSTAT) 0.4 MG SL tablet Place 1 tablet (0.4 mg total) under the tongue every 5 (five) minutes as needed for chest pain. 25 tablet 1  . NON FORMULARY Place 2 L into the nose daily as needed (Oxygen).     . potassium chloride SA (K-DUR,KLOR-CON) 20 MEQ tablet Take  1 tablet (20 mEq total) by mouth daily. 30 tablet 3  . rivaroxaban (XARELTO) 20 MG TABS tablet Take 1 tablet (20 mg total) by mouth daily with supper. 90 tablet 3  . sacubitril-valsartan (ENTRESTO) 24-26 MG Take 1 tablet by mouth 2 (two) times daily. 60 tablet 3  . Tamsulosin HCl (FLOMAX) 0.4 MG CAPS Take 0.4 mg by mouth 2 (two) times daily.     . [DISCONTINUED] metoprolol tartrate (LOPRESSOR) 25 MG tablet Take 1/2 tablet every other day  45 tablet 3   No current facility-administered medications for this visit.   OBJECTIVE: Filed Vitals:   04/10/14 0957  BP: 111/63  Pulse: 63  Temp: 97.8 F (36.6 C)  Resp: 20   Body mass index is 25.83 kg/(m^2). ECOG FS:0 - Asymptomatic Ocular: Sclerae unicteric, pupils equal, round and reactive to light Ear-nose-throat: Oropharynx clear, dentition fair Lymphatic: No cervical or supraclavicular adenopathy Lungs course and diminished bilaterally Heart regular rate and rhythm, no murmur appreciated Abd soft, nontender, positive bowel sounds MSK no focal spinal tenderness, no joint edema Neuro: non-focal, well-oriented, appropriate affect  LAB RESULTS: CMP     Component Value Date/Time   NA 137 02/26/2014 1027   NA 136 01/17/2013 1337   K 4.2 02/26/2014 1027   K 4.5 01/17/2013 1337   CL 101 02/26/2014 1027   CL 97* 01/17/2013 1337   CO2 23 02/26/2014 1027   CO2 31 01/17/2013 1337   GLUCOSE 128* 02/26/2014 1027   GLUCOSE 94  01/17/2013 1337   BUN 17 02/26/2014 1027   BUN 18 01/17/2013 1337   CREATININE 1.32 02/26/2014 1027   CREATININE 1.43* 11/12/2013 1019   CALCIUM 8.6 02/26/2014 1027   CALCIUM 8.9 01/17/2013 1337   PROT 6.5 08/13/2013 1149   ALBUMIN 3.8 08/13/2013 1149   AST 14 08/13/2013 1149   ALT <8 08/13/2013 1149   ALKPHOS 70 08/13/2013 1149   BILITOT 0.6 08/13/2013 1149   GFRNONAA 49* 02/26/2014 1027   GFRAA 57* 02/26/2014 1027   No results found for: SPEP Lab Results  Component Value Date   WBC 6.0 04/10/2014   NEUTROABS 4.3 04/10/2014   HGB 13.3 04/10/2014   HCT 40.2 04/10/2014   MCV 97 04/10/2014   PLT 219 04/10/2014   No results found for: LABCA2 No components found for: [id number] No results for input(s): INR in the last 168 hours.  STUDIES: None  ASSESSMENT/PLAN: Andrew [name] is a very pleasant 81 year old gentleman with both iron deficiency and low erythropoietin level. His last iron infusion was in July. He states that his energy is much improved.  His Hgb is 13.3 MCV 96. We will see what his iron studies show.  We will see him back in 3 months for labs and follow-up.  He knows to call here with any questions or concerns and to go to the ED in the event of an emergency. We can certainly see him sooner if need be.   Verdie Mosher, NP 04/10/2014 10:12 AM

## 2014-04-10 NOTE — Patient Instructions (Signed)
Your physician recommends that you schedule a follow-up appointment in: 6-8 weeks  

## 2014-04-10 NOTE — Progress Notes (Unsigned)
No injection today per dr. ennever 

## 2014-04-10 NOTE — Progress Notes (Signed)
Patient ID: Andrew [name], male   DOB: Jun 04, 1930, 81 y.o.   MRN: [id number] Andrew [name] Date of Birth: 1930/04/25 Medical Record #[id number] Primary cardiologist: Andrew [name]/Andrew [name] PCP: Andrew [name]/Andrew [name]  History of Present Illness: Andrew [name] is an 81 y/o male with a complex medical history which includes COPD (on oxygen at night), CAD with remote MIs and multiple stents, paroxysmal AF/AFL, chronic systolic HF EF 30-35% ( July 2015) was 15% in 2012 (ischemic cardiomyopathy). He is s/p Medtronic CRT-D. Referred by Dr. Conley [name] for further management of his HF,  Other issues include HTN, HLD, CKD, iron deficiency anemia - receiving IV iron. Was admitted back earlier in 2014 with sepsis - had ampicillin sensitive enterococcal bacteremia. His TEE was negative for a vegetation on his device.   Was admitted at the end of March of 2015 with COPD exacerbation and urinary retention. Xarelto dose was cut back due to his kidney function - has now normalized and he was put back on regular dose. Was admitted in July with HF and AFL.   He is on Xarelto for AF. Previously on amio for a long time but stopped to elevation of LFTs.    He returns for follow up. Last visit he was instructed to stop crestor due to myalgia.  He saw Dr Andrew [name] and ranexa  was stopped due to myaligias. Says he feels better off ranexa. SBP at home 90s and having some weakness.  Breathing at baseline. Mild dypnea with exertion. Does use riding cart in the grocery store because he has chronic back pain. No CP.  Denies orthopnea. Weight at home 178 pounds. Taking all medications.   Following low salt diet and limiting fluid intake to < 2 liters per day.   He has not tolerated spironolactone or eplerenone in the past due to gynecomastia.He has not tolerated statin in the past due to muscle aches with simvastatin, crestor, and atorvastatin.  Optivol: fluid index below threshold. Minimal atrial fibrillation. Activity about 2 hours per day.   Labs (1/15): LDL  119 Labs (8/15): K 5.4, creatinine 1.4 => 1.28, TSH 5.07 (mild elevation), BNP 803 Labs (9/15): TSH normal Labs (10/15): K 3.7, creatinine 1.26 Labs (02/12/14): K 3.8 Creatinine 1.19 Hgb 13.0  Labs (02/26/14): K 4.2 Creatinine 1.32    Current Outpatient Prescriptions  Medication Sig Dispense Refill  . albuterol (PROAIR HFA) 108 (90 BASE) MCG/ACT inhaler Inhale 1 to 2 puffs everys 6 hours as needed (Patient taking differently: Inhale 1 to 2 puffs everys 6 hours as needed for wheezing) 3 each 0  . albuterol (PROVENTIL) (5 MG/ML) 0.5% nebulizer solution Take 2.5 mg by nebulization every 6 (six) hours as needed for wheezing or shortness of breath.    . ALPRAZolam (XANAX) 0.25 MG tablet Take 0.25 mg by mouth daily as needed. For anxiety.    . carvedilol (COREG) 25 MG tablet TAKE 1/2 TABLET TWICE A DAY WITH MEALS --12.5 mg twice daily    . esomeprazole (NEXIUM) 40 MG capsule Take 40 mg by mouth 2 (two) times daily before a meal.    . fluticasone (FLONASE) 50 MCG/ACT nasal spray Place 2 sprays into both nostrils as needed for allergies. (Patient taking differently: Place 1 spray into both nostrils daily. ) 16 g 11  . Fluticasone-Salmeterol (ADVAIR) 250-50 MCG/DOSE AEPB Inhale 1 puff into the lungs 2 (two) times daily.    . furosemide (LASIX) 40 MG tablet Take 40 mg by mouth in the pm.Take 1 extra tablet for weight greater than 180lb. 135  tablet 2  . furosemide (LASIX) 80 MG tablet Take 1 tab by mouth  Daily (80 mg)  in AM 90 tablet 2  . guaiFENesin (MUCINEX) 600 MG 12 hr tablet Take 600 mg by mouth 2 (two) times daily.    [name] HYDROcodone-acetaminophen (NORCO/VICODIN) 5-325 MG per tablet Take 1 tablet by mouth every 6 (six) hours. Arthritis pain    . ipratropium (ATROVENT) 0.02 % nebulizer solution Take 2.5 mLs (500 mcg total) by nebulization 3 (three) times daily. (Patient taking differently: Take 0.5 mg by nebulization 3 (three) times daily. ) 75 mL 8  . meclizine (ANTIVERT) 25 MG tablet Take 12.5-25 mg  by mouth 3 (three) times daily as needed for dizziness.    . NON FORMULARY Place 2 L into the nose at bedtime as needed (Oxygen).     . potassium chloride SA (K-DUR,KLOR-CON) 20 MEQ tablet Take 1 tablet (20 mEq total) by mouth daily. 30 tablet 3  . rivaroxaban (XARELTO) 20 MG TABS tablet Take 1 tablet (20 mg total) by mouth daily with supper. 90 tablet 3  . sacubitril-valsartan (ENTRESTO) 24-26 MG Take 1 tablet by mouth 2 (two) times daily. 60 tablet 3  . Tamsulosin HCl (FLOMAX) 0.4 MG CAPS Take 0.4 mg by mouth 2 (two) times daily.     . nitroGLYCERIN (NITROSTAT) 0.4 MG SL tablet Place 1 tablet (0.4 mg total) under the tongue every 5 (five) minutes as needed for chest pain. (Patient not taking: Reported on 04/10/2014) 25 tablet 1  . [DISCONTINUED] metoprolol tartrate (LOPRESSOR) 25 MG tablet Take 1/2 tablet every other day  45 tablet 3   No current facility-administered medications for this encounter.    Allergies  Allergen Reactions  . Codeine Nausea And Vomiting  . Inspra [Eplerenone] Other (See Comments)    Stomach problems  . Spironolactone Other (See Comments)    Gynecomastia  . Atorvastatin Other (See Comments)    Arthritis/pain  . Crestor [Rosuvastatin] Other (See Comments)    Arthritis/pain ("couldn't hardly walk")  . Doxycycline Itching and Rash  . Simvastatin Other (See Comments)    Arthritis/pain  . Sulfonamide Derivatives Itching and Rash    Past Medical History  Diagnosis Date  . Chronic systolic heart failure     a. Chronic LV systolic failure. b. Echo in Dec 2011 showed EF of 25% - f/u echo 02/2011: Mild LVH, EF 15%, posterior lateral akinesis, inferior akinesis, grade 1 diastolic dysfunction, mild LAE. c. s/p BiV pacemaker 06/2011.  [name] Atrial flutter     a. Off amiodarone because of increased LFTs  . COPD (chronic obstructive pulmonary disease)     Chronic SOB, O2 dependent  . Myocardial infarction 1994  . Gastro - esophageal reflux   . Claudication   . BPH  (benign prostatic hyperplasia)   . Coronary artery disease     a. Myocardial infarction in 1984,  99 and 07. b. H/o multiple RCA stents. c.  LHC 04/2011: without obstructive disease. oLM 25%, mLAD 30%, pCFX 25%, prox to mid RCA stent ok, dRCA 30% and 70%, pPDA 25%, mPL 25-30%, EF 25%.  [name] Hypertension   . Atrial fibrillation     a. On Xarelto.  . Hemorrhoids   . Hyperlipidemia     statin intolerant 2/2 myalgias  . Hx of adenomatous colonic polyps   . Hx of colonoscopy   . Chronic renal insufficiency   . Biventricular cardiac pacemaker in situ 3.2013  . NSVT (nonsustained ventricular tachycardia)     a.  04/2012- 6 beats on tele.  [name]. Shortness of breath   . Arthritis     hips ,back ,hands  . Iron deficiency anemia, unspecified 03/20/2013    Past Surgical History  Procedure Laterality Date  . Cardiac catheterization  2008  . Cataract extraction    . Salivary gland surgery    . Coronary angioplasty with stent placement    . Insert / replace / remove pacemaker  06/16/2011  . Tee without cardioversion  05/08/2012    Procedure: TRANSESOPHAGEAL ECHOCARDIOGRAM (TEE);  Surgeon: Vesta MixerPhilip J Nahser, MD;  [facility];  Service: Cardiovascular;  Laterality: N/A;  . Bi-ventricular pacemaker insertion N/A 06/16/2011    Procedure: BI-VENTRICULAR PACEMAKER INSERTION (CRT-P);  Surgeon: Duke SalviaSteven C Klein, MD;  [facility];  Service: Cardiovascular;  Laterality: N/A;    History  Smoking status  . Former Smoker -- 1.50 packs/day for 55 years  . Types: Cigarettes  . Start date: 05/21/1954  . Quit date: 01/03/2009  Smokeless tobacco  . Never Used    Comment: quit 4 years ago    History  Alcohol Use No    Comment: occasional    Family History  Problem Relation Age of Onset  . Coronary artery disease Father   . Heart disease Mother   . Colon cancer Neg Hx   . Heart disease Brother     Review of Systems: The review of systems is per the HPI.  All other systems were reviewed and  are negative.  Physical Exam: BP 112/62 mmHg  Pulse 75  Wt 180 lb (81.647 kg)  SpO2 96% General:  NAD HEENT: normal  Neck: supple. JVP 5-6 cm. Carotids 2+ bilat; no bruits. No lymphadenopathy or thryomegaly appreciated. Cor: PMI nondisplaced. Regular rate & rhythm. No rubs, gallops or murmurs. Lungs: clear with decreased BS throughout Abdomen: soft, nontender, nondistended. No hepatosplenomegaly. No bruits or masses. Good bowel sounds. Extremities: no cyanosis, clubbing, rash.  No lower extremity edema with bilateral lower leg varicosities. Neuro: alert & orientedx3, cranial nerves grossly intact. moves all 4 extremities w/o difficulty. Affect pleasant    Wt Readings from Last 3 Encounters:  04/10/14 180 lb (81.647 kg)  04/10/14 180 lb (81.647 kg)  03/27/14 184 lb 9.6 oz (83.734 kg)    LABORATORY DATA/PROCEDURES: PENDING  Lab Results  Component Value Date   WBC 6.0 04/10/2014   HGB 13.3 04/10/2014   HCT 40.2 04/10/2014   PLT 219 04/10/2014   GLUCOSE 128* 02/26/2014   CHOL 184 05/02/2013   TRIG 243.0* 05/02/2013   HDL 29.50* 05/02/2013   LDLDIRECT 119.4 05/02/2013   LDLCALC 83 03/17/2012   ALT <8 08/13/2013   AST 14 08/13/2013   NA 137 02/26/2014   K 4.2 02/26/2014   CL 101 02/26/2014   CREATININE 1.32 02/26/2014   BUN 17 02/26/2014   CO2 23 02/26/2014   TSH 3.927 12/28/2013   INR 1.77* 05/03/2012   HGBA1C 6.0* 03/10/2011    BNP (last 3 results)  Recent Labs  10/10/13 1507 10/22/13 1845 02/12/14 0953  PROBNP 1021.0* 6264.0* 5369.0*   Assessment / Plan: 1. Chronic systolic CHF: EF 81-19%30-35% on last echo, ischemic cardiomyopathy. Medtronic BiV ICD.  Stable NYHA class II-III symptoms.  Volume status stable.  Having mild dizziness in am. I check orthostatics sitting 102/60 Standing 100/60 so will continue lasix 80 mg in am and 40 mg in pm. Take 20 meq potassium daily.- Continue current Coreg 12.5 mg twice a day. Considered increasing pm dose  however he declined  medication titration.   - Continue  Entresto 24/26 mg bid will not up titrate.  Had been on a higher dose but it was cut back due to dizziness.   -  No spironolactone due to gynecomastia.  He was tried on eplerenone but did not tolerate this either.  Optivol - fluid below threshold Activity about 2 hours per day.  2. COPD on nocturnal oxygen: wears at night.  3. CAD s/p multiple PCIs: No evidence of ischemia . He is on Xarelto with stable CAD so no ASA.  He has not been on a statin.  Intolerant crestor, atorvastatin, and simvastatin due to muscle aches.   4. Atrial flutter/fib/VT/PVCs: Appears in regular rhythm. No longer on amiodarone. On xarelto for anti-coagulation.   20 mg Xarelto mg daily.  Minimal atrial fibrillation by Optivol check.   5. CKD: Last BMET as above.---> 1.32     Follow up in 6-8 weeks  Anetha Slagel,NP-C  04/10/2014

## 2014-04-15 ENCOUNTER — Other Ambulatory Visit: Payer: Self-pay

## 2014-04-15 ENCOUNTER — Telehealth (HOSPITAL_COMMUNITY): Payer: Self-pay | Admitting: Vascular Surgery

## 2014-04-15 ENCOUNTER — Other Ambulatory Visit (HOSPITAL_COMMUNITY): Payer: Self-pay | Admitting: *Deleted

## 2014-04-15 MED ORDER — SACUBITRIL-VALSARTAN 24-26 MG PO TABS: 1.0000 | ORAL_TABLET | Freq: Two times a day (BID) | ORAL | Status: AC

## 2014-04-15 MED ORDER — POTASSIUM CHLORIDE CRYS ER 20 MEQ PO TBCR: 20.0000 meq | EXTENDED_RELEASE_TABLET | Freq: Every day | ORAL | Status: AC

## 2014-04-15 NOTE — Telephone Encounter (Signed)
OPEN IN ERROR 

## 2014-04-16 ENCOUNTER — Other Ambulatory Visit: Payer: Self-pay | Admitting: Radiology

## 2014-04-16 DIAGNOSIS — I6529 Occlusion and stenosis of unspecified carotid artery: Secondary | ICD-10-CM

## 2014-04-17 ENCOUNTER — Ambulatory Visit: Payer: Medicare Other | Admitting: Family

## 2014-04-17 ENCOUNTER — Other Ambulatory Visit: Payer: Medicare Other | Admitting: Lab

## 2014-04-17 ENCOUNTER — Ambulatory Visit: Payer: Medicare Other

## 2014-04-18 ENCOUNTER — Telehealth: Payer: Self-pay

## 2014-04-18 DIAGNOSIS — R062 Wheezing: Secondary | ICD-10-CM

## 2014-04-18 DIAGNOSIS — R0600 Dyspnea, unspecified: Secondary | ICD-10-CM

## 2014-04-18 DIAGNOSIS — J441 Chronic obstructive pulmonary disease with (acute) exacerbation: Secondary | ICD-10-CM

## 2014-04-18 MED ORDER — ALBUTEROL SULFATE (5 MG/ML) 0.5% IN NEBU: 2.5000 mg | INHALATION_SOLUTION | Freq: Four times a day (QID) | RESPIRATORY_TRACT | Status: DC | PRN

## 2014-04-18 MED ORDER — ALBUTEROL SULFATE (5 MG/ML) 0.5% IN NEBU: 2.5000 mg | INHALATION_SOLUTION | Freq: Four times a day (QID) | RESPIRATORY_TRACT | Status: AC | PRN

## 2014-04-18 MED ORDER — IPRATROPIUM BROMIDE 0.02 % IN SOLN: 0.5000 mg | Freq: Three times a day (TID) | RESPIRATORY_TRACT | Status: DC

## 2014-04-18 MED ORDER — ALBUTEROL SULFATE HFA 108 (90 BASE) MCG/ACT IN AERS: INHALATION_SPRAY | RESPIRATORY_TRACT | Status: AC

## 2014-04-18 NOTE — Telephone Encounter (Signed)
Patient left his nebulizer and medication at home and he is in [place]. Erskine SquibbJane his wife is requesting our office to please to call in "Albuterol 205 mg", "Atrovent .5mg " and a qty of 12 of the nebulizer solutions for the machine. [facility] [phone]. Erskine SquibbJane his wife's call back number is [phone] is requesting it asap if possible patient was suppose to do this treatment 2 hours ago.

## 2014-04-20 NOTE — Telephone Encounter (Signed)
Pt's wife says this has already been taken care of.

## 2014-04-20 NOTE — Telephone Encounter (Signed)
Can we please call these patient back - in the message it says Albuterol and Atrovent and it looks like albuterol neb and inhaler were sent in.  I want to make sure the patients has his medication.

## 2014-04-22 ENCOUNTER — Encounter: Payer: Self-pay | Admitting: Internal Medicine

## 2014-05-03 ENCOUNTER — Ambulatory Visit (INDEPENDENT_AMBULATORY_CARE_PROVIDER_SITE_OTHER): Payer: 59 | Admitting: Emergency Medicine

## 2014-05-03 ENCOUNTER — Ambulatory Visit (HOSPITAL_COMMUNITY): Payer: Medicare Other | Attending: Emergency Medicine | Admitting: Cardiology

## 2014-05-03 VITALS — BP 124/72 | HR 74 | Temp 97.6°F | Resp 18 | Ht 70.0 in | Wt 186.0 lb

## 2014-05-03 DIAGNOSIS — I2510 Atherosclerotic heart disease of native coronary artery without angina pectoris: Secondary | ICD-10-CM | POA: Diagnosis present

## 2014-05-03 DIAGNOSIS — L603 Nail dystrophy: Secondary | ICD-10-CM

## 2014-05-03 DIAGNOSIS — E785 Hyperlipidemia, unspecified: Secondary | ICD-10-CM | POA: Diagnosis not present

## 2014-05-03 DIAGNOSIS — J441 Chronic obstructive pulmonary disease with (acute) exacerbation: Secondary | ICD-10-CM

## 2014-05-03 DIAGNOSIS — I6529 Occlusion and stenosis of unspecified carotid artery: Secondary | ICD-10-CM

## 2014-05-03 DIAGNOSIS — Z87891 Personal history of nicotine dependence: Secondary | ICD-10-CM | POA: Diagnosis not present

## 2014-05-03 DIAGNOSIS — J309 Allergic rhinitis, unspecified: Secondary | ICD-10-CM

## 2014-05-03 DIAGNOSIS — I10 Essential (primary) hypertension: Secondary | ICD-10-CM | POA: Insufficient documentation

## 2014-05-03 DIAGNOSIS — L309 Dermatitis, unspecified: Secondary | ICD-10-CM

## 2014-05-03 DIAGNOSIS — I6523 Occlusion and stenosis of bilateral carotid arteries: Secondary | ICD-10-CM

## 2014-05-03 DIAGNOSIS — J449 Chronic obstructive pulmonary disease, unspecified: Secondary | ICD-10-CM | POA: Diagnosis not present

## 2014-05-03 DIAGNOSIS — R21 Rash and other nonspecific skin eruption: Secondary | ICD-10-CM

## 2014-05-03 LAB — POCT SKIN KOH
SKIN KOH, POC: NEGATIVE
Skin KOH, POC: NEGATIVE

## 2014-05-03 MED ORDER — AZELASTINE HCL 0.1 % NA SOLN: 1.0000 | Freq: Two times a day (BID) | NASAL | Status: AC

## 2014-05-03 MED ORDER — IPRATROPIUM-ALBUTEROL 0.5-2.5 (3) MG/3ML IN SOLN: 3.0000 mL | Freq: Three times a day (TID) | RESPIRATORY_TRACT | Status: AC

## 2014-05-03 MED ORDER — TRIAMCINOLONE 0.1 % CREAM:EUCERIN CREAM 1:1: 1.0000 "application " | TOPICAL_CREAM | Freq: Three times a day (TID) | CUTANEOUS | Status: DC

## 2014-05-03 MED ORDER — IPRATROPIUM-ALBUTEROL 0.5-2.5 (3) MG/3ML IN SOLN: 3.0000 mL | RESPIRATORY_TRACT | Status: DC | PRN

## 2014-05-03 NOTE — Patient Instructions (Signed)
Eczema Eczema, also called atopic dermatitis, is a skin disorder that causes inflammation of the skin. It causes a red rash and dry, scaly skin. The skin becomes very itchy. Eczema is generally worse during the cooler winter months and often improves with the warmth of summer. Eczema usually starts showing signs in infancy. Some children outgrow eczema, but it may last through adulthood.  CAUSES  The exact cause of eczema is not known, but it appears to run in families. People with eczema often have a family history of eczema, allergies, asthma, or hay fever. Eczema is not contagious. Flare-ups of the condition may be caused by:   Contact with something you are sensitive or allergic to.   Stress. SIGNS AND SYMPTOMS  Dry, scaly skin.   Red, itchy rash.   Itchiness. This may occur before the skin rash and may be very intense.  DIAGNOSIS  The diagnosis of eczema is usually made based on symptoms and medical history. TREATMENT  Eczema cannot be cured, but symptoms usually can be controlled with treatment and other strategies. A treatment plan might include:  Controlling the itching and scratching.   Use over-the-counter antihistamines as directed for itching. This is especially useful at night when the itching tends to be worse.   Use over-the-counter steroid creams as directed for itching.   Avoid scratching. Scratching makes the rash and itching worse. It may also result in a skin infection (impetigo) due to a break in the skin caused by scratching.   Keeping the skin well moisturized with creams every day. This will seal in moisture and help prevent dryness. Lotions that contain alcohol and water should be avoided because they can dry the skin.   Limiting exposure to things that you are sensitive or allergic to (allergens).   Recognizing situations that cause stress.   Developing a plan to manage stress.  HOME CARE INSTRUCTIONS   Only take over-the-counter or  prescription medicines as directed by your health care provider.   Do not use anything on the skin without checking with your health care provider.   Keep baths or showers short (5 minutes) in warm (not hot) water. Use mild cleansers for bathing. These should be unscented. You may add nonperfumed bath oil to the bath water. It is best to avoid soap and bubble bath.   Immediately after a bath or shower, when the skin is still damp, apply a moisturizing ointment to the entire body. This ointment should be a petroleum ointment. This will seal in moisture and help prevent dryness. The thicker the ointment, the better. These should be unscented.   Keep fingernails cut short. Children with eczema may need to wear soft gloves or mittens at night after applying an ointment.   Dress in clothes made of cotton or cotton blends. Dress lightly, because heat increases itching.   A child with eczema should stay away from anyone with fever blisters or cold sores. The virus that causes fever blisters (herpes simplex) can cause a serious skin infection in children with eczema. SEEK MEDICAL CARE IF:   Your itching interferes with sleep.   Your rash gets worse or is not better within 1 week after starting treatment.   You see pus or soft yellow scabs in the rash area.   You have a fever.   You have a rash flare-up after contact with someone who has fever blisters.  Document Released: 03/19/2000 Document Revised: 01/10/2013 Document Reviewed: 10/23/2012 Exit[employer] Patient Information 2015 [employer]. This information   is not intended to replace advice given to you by your health care provider. Make sure you discuss any questions you have with your health care provider.  

## 2014-05-03 NOTE — Progress Notes (Signed)
Carotid duplex performed 

## 2014-05-03 NOTE — Progress Notes (Addendum)
Subjective:  This chart was scribed for Andrew Chris MD, by Veverly Fells, at [facility].  This patient was seen in room 12 and the patient's care was started at 11:28 AM.     Patient ID: Andrew [name], male    DOB: Jul 15, 1930, 81 y.o.   MRN: [id number]  HPI  HPI Comments: Andrew [name] is a 81 y.o. male who presents to [facility] for:   SOB: Notes his oxygen level was running low recently.   Nasal congestion: Patient states he uses fluticasone, once a day 2 sprays in each nostril and notes of blood when he blows his nose.  States his congestion(clear)  is the worse at night.   He states he is also using Mucin ex but is still coughing up mucous.    Nail problem: Patient states he may toe nail fungus on his right foot.    Eczema: Notes of itching/sweling on his arms bilaterally for the last couple of weeks..  Patient states he also has the same on legs.  Patient uses soap which he states is medicated.            Patient Active Problem List   Diagnosis Date Noted  . CKD (chronic kidney disease) stage 3, GFR 30-59 ml/min 01/13/2014  . Hypothyroidism 12/03/2013  . CAP (community acquired pneumonia), possible 10/23/2013  . Chronic respiratory failure 10/23/2013  . Other malaise and fatigue 10/23/2013  . Acute on chronic systolic CHF (congestive heart failure) 10/22/2013  . Urinary retention 06/26/2013  . COPD exacerbation 06/25/2013  . Iron deficiency anemia 03/20/2013  . S/P biventricular cardiac pacemaker-[employer] 06/17/2011  . Atrial fibrillation 04/06/2011  . Coronary atherosclerosis of native coronary artery 01/15/2011  . Hyperlipidemia 11/26/2010  . Paroxysmal ventricular tachycardia 11/05/2010  . Ischemic cardiomyopathy 10/14/2010  . Atrial flutter   . Chronic systolic heart failure   . Hypertension   . Nonspecific (abnormal) findings on radiological and other examination of body structure 01/12/2010  . ABNORMAL  LUNG XRAY 01/12/2010  . DYSPNEA 12/02/2009  . LIVER FUNCTION TESTS, ABNORMAL, HX OF 12/02/2009  . Anemia 05/05/2009  . GERD 04/04/2007  . PERSONAL HISTORY OF COLONIC POLYPS 04/04/2007   Past Medical History  Diagnosis Date  . Chronic systolic heart failure     a. Chronic LV systolic failure. b. Echo in Dec 2011 showed EF of 25% - f/u echo 02/2011: Mild LVH, EF 15%, posterior lateral akinesis, inferior akinesis, grade 1 diastolic dysfunction, mild LAE. c. s/p BiV pacemaker 06/2011.  [name] Atrial flutter     a. Off amiodarone because of increased LFTs  . COPD (chronic obstructive pulmonary disease)     Chronic SOB, O2 dependent  . Myocardial infarction 1994  . Gastro - esophageal reflux   . Claudication   . BPH (benign prostatic hyperplasia)   . Coronary artery disease     a. Myocardial infarction in 1984,  99 and 07. b. H/o multiple RCA stents. c.  LHC 04/2011: without obstructive disease. oLM 25%, mLAD 30%, pCFX 25%, prox to mid RCA stent ok, dRCA 30% and 70%, pPDA 25%, mPL 25-30%, EF 25%.  [name] Hypertension   . Atrial fibrillation     a. On Xarelto.  . Hemorrhoids   . Hyperlipidemia     statin intolerant 2/2 myalgias  . Hx of adenomatous colonic polyps   . Hx of colonoscopy   . Chronic renal insufficiency   . Biventricular cardiac pacemaker in situ 3.2013  .  NSVT (nonsustained ventricular tachycardia)     a. 04/2012- 6 beats on tele.  [name] Shortness of breath   . Arthritis     hips ,back ,hands  . Iron deficiency anemia, unspecified 03/20/2013   Past Surgical History  Procedure Laterality Date  . Cardiac catheterization  2008  . Cataract extraction    . Salivary gland surgery    . Coronary angioplasty with stent placement    . Insert / replace / remove pacemaker  06/16/2011  . Tee without cardioversion  05/08/2012    Procedure: TRANSESOPHAGEAL ECHOCARDIOGRAM (TEE);  Surgeon: Vesta Mixer, MD;  Location: [facility] ENDOSCOPY;  Service: Cardiovascular;  Laterality: N/A;  . Bi-ventricular  pacemaker insertion N/A 06/16/2011    Procedure: BI-VENTRICULAR PACEMAKER INSERTION (CRT-P);  Surgeon: Duke Salvia, MD;  Location: [facility] CATH LAB;  Service: Cardiovascular;  Laterality: N/A;   Allergies  Allergen Reactions  . Codeine Nausea And Vomiting  . Inspra [Eplerenone] Other (See Comments)    Stomach problems  . Spironolactone Other (See Comments)    Gynecomastia  . Atorvastatin Other (See Comments)    Arthritis/pain  . Crestor [Rosuvastatin] Other (See Comments)    Arthritis/pain ("couldn't hardly walk")  . Doxycycline Itching and Rash  . Simvastatin Other (See Comments)    Arthritis/pain  . Sulfonamide Derivatives Itching and Rash   Prior to Admission medications   Medication Sig Start Date End Date Taking? Authorizing Provider  albuterol (PROAIR HFA) 108 (90 BASE) MCG/ACT inhaler Inhale 1 to 2 puffs everys 6 hours as needed 04/18/14  Yes [name], MD  albuterol (PROVENTIL) (5 MG/ML) 0.5% nebulizer solution Take 0.5 mLs (2.5 mg total) by nebulization every 6 (six) hours as needed for wheezing or shortness of breath. 04/18/14  Yes Morrell Riddle, PA-C  ALPRAZolam Prudy Feeler) 0.25 MG tablet Take 0.25 mg by mouth daily as needed. For anxiety. 06/23/10  Yes Roger Shelter, MD  carvedilol (COREG) 25 MG tablet TAKE 1/2 TABLET TWICE A DAY WITH MEALS --12.5 mg twice daily 12/05/13  Yes Rollene Rotunda, MD  esomeprazole (NEXIUM) 40 MG capsule Take 40 mg by mouth 2 (two) times daily before a meal.   Yes Historical Provider, MD  fluticasone (FLONASE) 50 MCG/ACT nasal spray Place 2 sprays into both nostrils as needed for allergies. Patient taking differently: Place 1 spray into both nostrils daily.  07/04/13 07/10/14 Yes [name], MD  Fluticasone-Salmeterol (ADVAIR) 250-50 MCG/DOSE AEPB Inhale 1 puff into the lungs 2 (two) times daily.   Yes Historical Provider, MD  furosemide (LASIX) 40 MG tablet Take 40 mg by mouth in the pm.Take 1 extra tablet for weight greater than 180lb. 03/22/14  Yes  Rollene Rotunda, MD  furosemide (LASIX) 80 MG tablet Take 1 tab by mouth  Daily (80 mg)  in AM 03/22/14  Yes Rollene Rotunda, MD  guaiFENesin (MUCINEX) 600 MG 12 hr tablet Take 600 mg by mouth 2 (two) times daily.   Yes Historical Provider, MD  HYDROcodone-acetaminophen (NORCO/VICODIN) 5-325 MG per tablet Take 1 tablet by mouth every 6 (six) hours. Arthritis pain   Yes Historical Provider, MD  ipratropium (ATROVENT) 0.02 % nebulizer solution Take 2.5 mLs (0.5 mg total) by nebulization 3 (three) times daily. 04/18/14  Yes Morrell Riddle, PA-C  meclizine (ANTIVERT) 25 MG tablet Take 12.5-25 mg by mouth 3 (three) times daily as needed for dizziness.   Yes Historical Provider, MD  nitroGLYCERIN (NITROSTAT) 0.4 MG SL tablet Place 1 tablet (0.4 mg total) under the tongue  every 5 (five) minutes as needed for chest pain. 03/12/14  Yes Emi Belfasteborah B Gessner, FNP  NON FORMULARY Place 2 L into the nose at bedtime as needed (Oxygen).    Yes Historical Provider, MD  potassium chloride SA (K-DUR,KLOR-CON) 20 MEQ tablet Take 1 tablet (20 mEq total) by mouth daily. 04/15/14  Yes Dolores Pattyaniel R Bensimhon, MD  rivaroxaban (XARELTO) 20 MG TABS tablet Take 1 tablet (20 mg total) by mouth daily with supper. 08/10/13  Yes Rosalio MacadamiaLori C Gerhardt, NP  sacubitril-valsartan (ENTRESTO) 24-26 MG Take 1 tablet by mouth 2 (two) times daily. 04/15/14  Yes Laurey Moralealton S McLean, MD  Tamsulosin HCl (FLOMAX) 0.4 MG CAPS Take 0.4 mg by mouth 2 (two) times daily.    Yes Historical Provider, MD   History   Social History  . Marital Status: Married    Spouse Name: N/A    Number of Children: N/A  . Years of Education: N/A   Occupational History  . retired Designer, industrial/productadministrative work    Social History Main Topics  . Smoking status: Former Smoker -- 1.50 packs/day for 55 years    Types: Cigarettes    Start date: 05/21/1954    Quit date: 01/03/2009  . Smokeless tobacco: Never Used     Comment: quit 4 years ago  . Alcohol Use: No     Comment: occasional  . Drug  Use: No  . Sexual Activity: Not Currently   Other Topics Concern  . Not on file   Social History Narrative       Review of Systems  Constitutional: Negative for fever and chills.  HENT: Positive for rhinorrhea and sinus pressure.   Respiratory: Positive for cough and shortness of breath.   Skin: Positive for color change.       Objective:   Physical Exam CONSTITUTIONAL: Well developed/well nourished HEAD: Normocephalic/atraumatic EYES: EOMI/PERRL ENMT: Mucous membranes moist NECK: supple no meningeal signs SPINE/BACK:entire spine nontender DG:LOVFIEPPCV:slightly irregular. LUNGS: decreased breath sounds in the bases but no rales or dullness ABDOMEN: soft, nontender, no rebound or guarding, bowel sounds noted throughout abdomen GU:no cva tenderness NEURO: Pt is awake/alert/appropriate, moves all extremitiesx4.  No facial droop.   EXTREMITIES: pulses normal/equal, full ROM Toe: dystrophic changes of the right great toenail.   SKIN: there is a red scaly rash involving both upper extremtieis.. Theses areas are warm to tough but non tender.  PSYCH: no abnormalities of mood noted, alert and oriented to situation          Filed Vitals:   05/03/14 1115  BP: 124/72  Pulse: 74  Temp: 97.6 F (36.4 C)  TempSrc: Oral  Resp: 18  Height: 5\' 10"  (1.778 m)  Weight: 186 lb (84.369 kg)  SpO2: 90%          Assessment & Plan:  Apply Eucerin plus triamcinolone cream to the rash on his upper extremities. He was advised to get Aveeno soap. He is to  decrease hot showers. We'll change to DuoNeb. Prenatal breakdown in his toenail is secondary to a dystrophic nail and not fungus. Repeat pulse ox was 94.. I personally performed the services described in this documentation, which was scribed in my presence. The recorded information has been reviewed and is accurate.

## 2014-05-13 ENCOUNTER — Ambulatory Visit (INDEPENDENT_AMBULATORY_CARE_PROVIDER_SITE_OTHER): Payer: Medicare Other | Admitting: Family Medicine

## 2014-05-13 ENCOUNTER — Ambulatory Visit (INDEPENDENT_AMBULATORY_CARE_PROVIDER_SITE_OTHER): Payer: Medicare Other

## 2014-05-13 ENCOUNTER — Other Ambulatory Visit: Payer: Self-pay | Admitting: Family

## 2014-05-13 ENCOUNTER — Telehealth: Payer: Self-pay

## 2014-05-13 VITALS — BP 120/60 | HR 73 | Temp 97.4°F | Resp 18 | Ht 70.0 in | Wt 182.0 lb

## 2014-05-13 DIAGNOSIS — J441 Chronic obstructive pulmonary disease with (acute) exacerbation: Secondary | ICD-10-CM

## 2014-05-13 DIAGNOSIS — I5022 Chronic systolic (congestive) heart failure: Secondary | ICD-10-CM

## 2014-05-13 MED ORDER — PREDNISONE 20 MG PO TABS: ORAL_TABLET | ORAL | Status: DC

## 2014-05-13 NOTE — Progress Notes (Addendum)
[facility] [address], [place] [id number] [phone]  Date:  05/13/2014   Name:  Andrew [name]   DOB:  1930/10/09   MRN:  [id number]  PCP:  Lucilla EdinAUB, STEVE A, MD    Chief Complaint: Immunizations; chest congestion; Cough; and Nasal Congestion   History of Present Illness:  Andrew [name] is a 81 y.o. very pleasant male patient who presents with the following:  He was here about 10 days ago with a rash.  Itching is better with the triamcinolone although he continues to have dry and flaky skin.   He has thickening of the right great toenail.  This does not cause any discomfort.  He has tried some vicks vapo-rub on the toe; seemed to help some, he was not sure if anything else needs to be done.     He has COPD, and has a chronic cough.  Thinks he may be having a COPD exacerbation.  His cough has been worse over the last 3-4 days.  The cough can be productive.    He may feel SOB at times- this seems to wax and wane.  No real change in this symptom.  He has O2 for home- he uses 2L at night and sometimes during the day.  He has used it during the day more recently  He denies any orthopnea.    History of CKD, CHF as well.  He has a pacemaker Patient Active Problem List   Diagnosis Date Noted  . CKD (chronic kidney disease) stage 3, GFR 30-59 ml/min 01/13/2014  . Hypothyroidism 12/03/2013  . CAP (community acquired pneumonia), possible 10/23/2013  . Chronic respiratory failure 10/23/2013  . Other malaise and fatigue 10/23/2013  . Acute on chronic systolic CHF (congestive heart failure) 10/22/2013  . Urinary retention 06/26/2013  . COPD exacerbation 06/25/2013  . Iron deficiency anemia 03/20/2013  . S/P biventricular cardiac pacemaker-Medtronic 06/17/2011  . Atrial fibrillation 04/06/2011  . Coronary atherosclerosis of native coronary artery 01/15/2011  . Hyperlipidemia 11/26/2010  . Paroxysmal ventricular tachycardia 11/05/2010  . Ischemic cardiomyopathy  10/14/2010  . Atrial flutter   . Chronic systolic heart failure   . Hypertension   . Nonspecific (abnormal) findings on radiological and other examination of body structure 01/12/2010  . ABNORMAL LUNG XRAY 01/12/2010  . DYSPNEA 12/02/2009  . LIVER FUNCTION TESTS, ABNORMAL, HX OF 12/02/2009  . Anemia 05/05/2009  . GERD 04/04/2007  . PERSONAL HISTORY OF COLONIC POLYPS 04/04/2007    Past Medical History  Diagnosis Date  . Chronic systolic heart failure     a. Chronic LV systolic failure. b. Echo in Dec 2011 showed EF of 25% - f/u echo 02/2011: Mild LVH, EF 15%, posterior lateral akinesis, inferior akinesis, grade 1 diastolic dysfunction, mild LAE. c. s/p BiV pacemaker 06/2011.  [name]. Atrial flutter     a. Off amiodarone because of increased LFTs  . COPD (chronic obstructive pulmonary disease)     Chronic SOB, O2 dependent  . Myocardial infarction 1994  . Gastro - esophageal reflux   . Claudication   . BPH (benign prostatic hyperplasia)   . Coronary artery disease     a. Myocardial infarction in 1984,  99 and 07. b. H/o multiple RCA stents. c.  LHC 04/2011: without obstructive disease. oLM 25%, mLAD 30%, pCFX 25%, prox to mid RCA stent ok, dRCA 30% and 70%, pPDA 25%, mPL 25-30%, EF 25%.  [name]. Hypertension   . Atrial fibrillation  a. On Xarelto.  . Hemorrhoids   . Hyperlipidemia     statin intolerant 2/2 myalgias  . Hx of adenomatous colonic polyps   . Hx of colonoscopy   . Chronic renal insufficiency   . Biventricular cardiac pacemaker in situ 3.2013  . NSVT (nonsustained ventricular tachycardia)     a. 04/2012- 6 beats on tele.  [name] Shortness of breath   . Arthritis     hips ,back ,hands  . Iron deficiency anemia, unspecified 03/20/2013    Past Surgical History  Procedure Laterality Date  . Cardiac catheterization  2008  . Cataract extraction    . Salivary gland surgery    . Coronary angioplasty with stent placement    . Insert / replace / remove pacemaker  06/16/2011  . Tee  without cardioversion  05/08/2012    Procedure: TRANSESOPHAGEAL ECHOCARDIOGRAM (TEE);  Surgeon: Vesta Mixer, MD;  Location[facility] END[facility]COPY;  Service: Cardiovascular;  Laterality: N/A;  . Bi-ventricular pacemaker insertion N/A 06/16/2011    Procedure: BI-VENTRICULAR PACEMAKER INSERTION (CRT-P);  Surgeon: Duke Salvia, MD;  Location: [facility] CATH LAB;  Service: Cardiovascular;  Laterality: N/A;    History  Substance Use Topics  . Smoking status: Former Smoker -- 1.50 packs/day for 55 years    Types: Cigarettes    Start date: 05/21/1954    Quit date: 01/03/2009  . Smokeless tobacco: Never Used     Comment: quit 4 years ago  . Alcohol Use: No     Comment: occasional    Family History  Problem Relation Age of Onset  . Coronary artery disease Father   . Heart disease Mother   . Colon cancer Neg Hx   . Heart disease Brother     Allergies  Allergen Reactions  . Codeine Nausea And Vomiting  . Inspra [Eplerenone] Other (See Comments)    Stomach problems  . Spironolactone Other (See Comments)    Gynecomastia  . Atorvastatin Other (See Comments)    Arthritis/pain  . Crestor [Rosuvastatin] Other (See Comments)    Arthritis/pain ("couldn't hardly walk")  . Doxycycline Itching and Rash  . Simvastatin Other (See Comments)    Arthritis/pain  . Sulfonamide Derivatives Itching and Rash    Medication list has been reviewed and updated.  Current Outpatient Prescriptions on File Prior to Visit  Medication Sig Dispense Refill  . albuterol (PROAIR HFA) 108 (90 BASE) MCG/ACT inhaler Inhale 1 to 2 puffs everys 6 hours as needed 3 each 0  . albuterol (PROVENTIL) (5 MG/ML) 0.5% nebulizer solution Take 0.5 mLs (2.5 mg total) by nebulization every 6 (six) hours as needed for wheezing or shortness of breath. 20 mL 0  . ALPRAZolam (XANAX) 0.25 MG tablet Take 0.25 mg by mouth daily as needed. For anxiety.    [name] azelastine (ASTELIN) 0.1 % nasal spray Place 1 spray into both nostrils 2 (two) times daily.  Use in each nostril as directed 30 mL 12  . carvedilol (COREG) 25 MG tablet TAKE 1/2 TABLET TWICE A DAY WITH MEALS --12.5 mg twice daily    . esomeprazole (NEXIUM) 40 MG capsule Take 40 mg by mouth 2 (two) times daily before a meal.    . fluticasone (FLONASE) 50 MCG/ACT nasal spray Place 2 sprays into both nostrils as needed for allergies. (Patient taking differently: Place 1 spray into both nostrils daily. ) 16 g 11  . Fluticasone-Salmeterol (ADVAIR) 250-50 MCG/DOSE AEPB Inhale 1 puff into the lungs 2 (two) times daily.    . furosemide (  LASIX) 40 MG tablet Take 40 mg by mouth in the pm.Take 1 extra tablet for weight greater than 180lb. 135 tablet 2  . furosemide (LASIX) 80 MG tablet Take 1 tab by mouth  Daily (80 mg)  in AM 90 tablet 2  . guaiFENesin (MUCINEX) 600 MG 12 hr tablet Take 600 mg by mouth 2 (two) times daily.    [name] HYDROcodone-acetaminophen (NORCO/VICODIN) 5-325 MG per tablet Take 1 tablet by mouth every 6 (six) hours. Arthritis pain    . ipratropium (ATROVENT) 0.02 % nebulizer solution Take 2.5 mLs (0.5 mg total) by nebulization 3 (three) times daily. 75 mL 0  . ipratropium-albuterol (DUONEB) 0.5-2.5 (3) MG/3ML SOLN Take 3 mLs by nebulization 3 (three) times daily. 120 mL 11  . meclizine (ANTIVERT) 25 MG tablet Take 12.5-25 mg by mouth 3 (three) times daily as needed for dizziness.    . nitroGLYCERIN (NITROSTAT) 0.4 MG SL tablet Place 1 tablet (0.4 mg total) under the tongue every 5 (five) minutes as needed for chest pain. 25 tablet 1  . NON FORMULARY Place 2 L into the nose at bedtime as needed (Oxygen).     . potassium chloride SA (K-DUR,KLOR-CON) 20 MEQ tablet Take 1 tablet (20 mEq total) by mouth daily. 90 tablet 3  . rivaroxaban (XARELTO) 20 MG TABS tablet Take 1 tablet (20 mg total) by mouth daily with supper. 90 tablet 3  . sacubitril-valsartan (ENTRESTO) 24-26 MG Take 1 tablet by mouth 2 (two) times daily. 180 tablet 3  . Tamsulosin HCl (FLOMAX) 0.4 MG CAPS Take 0.4 mg by mouth  2 (two) times daily.     . Triamcinolone Acetonide (TRIAMCINOLONE 0.1 % CREAM : EUCERIN) CREA Apply 1 application topically 3 (three) times daily. 1 each 0  . [DISCONTINUED] metoprolol tartrate (LOPRESSOR) 25 MG tablet Take 1/2 tablet every other day  45 tablet 3   No current facility-administered medications on file prior to visit.    Review of Systems:  As per HPI- otherwise negative.   Physical Examination: Filed Vitals:   05/13/14 1331  BP: 120/60  Pulse: 73  Temp: 97.4 F (36.3 C)  Resp: 18   Filed Vitals:   05/13/14 1331  Height:  (1.778 m)  Weight: 182 lb (82.555 kg)   Body mass index is 26.11 kg/(m^2). Ideal Body Weight: Weight in (lb) to have BMI = 25: 173.9  GEN: WDWN, NAD, Non-toxic, A & O x 3, quite alert and well- informed about his medical conditions HEENT: Atraumatic, Normocephalic. Neck supple. No masses, No LAD. Ears and Nose: No external deformity. CV: RRR, No M/G/R. No JVD. No thrill. No extra heart sounds. PULM: CTA B, no wheezes, crackles, rhonchi. No retractions. No resp. distress. No accessory muscle use. ABD: S, NT, ND, +BS. No rebound. No HSM. EXTR: No c/c/e NEURO Normal gait.  PSYCH: Normally interactive. Conversant. Not depressed or anxious appearing.  Calm demeanor.  Right great toenail:thickening of nail  [facility] reading (PRIMARY) by  Dr. Patsy Lager. CXR: pacemaker leads in place, chronic lung disease.  Right CP angle is blunted.    CHEST 2 VIEW  COMPARISON: 11/10/2013  FINDINGS: Cardiac shadow is stable. A pacing device is again seen and stable. Diffuse fibrotic changes are again seen throughout both lungs. No acute effusion or pneumothorax is noted.  IMPRESSION: Chronic changes without acute abnormality.  Total 120 mg of lasix daily currently.  Weight is stable.  He considers 180 to be his baseline weight  Wt Readings from Last 3  Encounters:  05/13/14 182 lb (82.555 kg)  05/03/14 186 lb (84.369 kg)  04/10/14 180 lb (81.647  kg)    Assessment and Plan: COPD exacerbation - Plan: DG Chest 2 View, predniSONE (DELTASONE) 20 MG tablet  Chronic systolic congestive heart failure - Plan: Brain natriuretic peptide  Here today with COPD exacerbation causing increased cough and need for home O2 during the day.  Will treat with a course of oral prednisone, continue duoneb, adviar.   He does have CHF but no definite sx of fluid overload.  Await BNP  He will follow-up closely if not improving  Reassured that as long as his toenail is not hurting him there is nothing further needed.  He is glad to hear this   Meds ordered this encounter  Medications  . predniSONE (DELTASONE) 20 MG tablet    Sig: Take 2 pills daily for 3 days, then 1 pill daily for 3 days    Dispense:  9 tablet    Refill:  0    Signed Abbe Amsterdam, MD  2/9: received his BNP.  Discussed with Dr. Antoine Poche.  As he is clinically euvolemic there is no need to diurese him further at this time. Discussed with pt, he states understanding and will let me know if any problems,  His weight continues to be stable today  Results for orders placed or performed in visit on 05/13/14  Brain natriuretic peptide  Result Value Ref Range   Brain Natriuretic Peptide 1100.4 (H) 0.0 - 100.0 pg/mL

## 2014-05-13 NOTE — Telephone Encounter (Signed)
Beckie Saltsessalon Pearles, June 2015. Spoke with Chelle and she stated this was ok. Pt understood.

## 2014-05-13 NOTE — Telephone Encounter (Signed)
Pt saw Dr. Patsy Lagercopland 2/8 and rx cough medicine, pt would like to know if it is ok to take what he has left over from the last time instead of having a new rx filled? Please advise.

## 2014-05-13 NOTE — Patient Instructions (Signed)
We are going to use prednisone for a few days for COPD exacerbation, and I will be in touch with your BNP (heart failure test).    Please let me know if you are not feeling better in the next couple of days

## 2014-05-14 ENCOUNTER — Telehealth: Payer: Self-pay | Admitting: Cardiology

## 2014-05-14 LAB — BRAIN NATRIURETIC PEPTIDE: Brain Natriuretic Peptide: 1100.4 pg/mL — ABNORMAL HIGH (ref 0.0–100.0)

## 2014-05-15 NOTE — Telephone Encounter (Signed)
There was nothing attached to this call

## 2014-06-01 ENCOUNTER — Other Ambulatory Visit: Payer: Self-pay | Admitting: Physician Assistant

## 2014-06-03 ENCOUNTER — Other Ambulatory Visit: Payer: Self-pay | Admitting: Physician Assistant

## 2014-06-03 NOTE — Telephone Encounter (Signed)
Please review for refill. Thanks!  

## 2014-06-14 ENCOUNTER — Ambulatory Visit (INDEPENDENT_AMBULATORY_CARE_PROVIDER_SITE_OTHER): Payer: Medicare Other | Admitting: Internal Medicine

## 2014-06-14 ENCOUNTER — Encounter: Payer: Self-pay | Admitting: Internal Medicine

## 2014-06-14 VITALS — BP 108/60 | HR 70 | Ht 70.0 in | Wt 183.2 lb

## 2014-06-14 DIAGNOSIS — Z45018 Encounter for adjustment and management of other part of cardiac pacemaker: Secondary | ICD-10-CM

## 2014-06-14 DIAGNOSIS — I255 Ischemic cardiomyopathy: Secondary | ICD-10-CM

## 2014-06-14 DIAGNOSIS — I480 Paroxysmal atrial fibrillation: Secondary | ICD-10-CM

## 2014-06-14 DIAGNOSIS — I5022 Chronic systolic (congestive) heart failure: Secondary | ICD-10-CM

## 2014-06-14 NOTE — Progress Notes (Signed)
.       Patient Care Team: [name], MD as PCP - General (Family Medicine) Meryl Dare, MD as Consulting Physician (Gastroenterology)   HPI  Andrew [name] is a 81 y.o. male Seen in follow-up after he presented in the spring with worsening shortness of breath that corresponded with the development of frequent ventricular ectopy.  He has a history of ischemic heart disease with ejection fractions ranging in a 15-30% range most recently assessed 2014.  He has a Medtronic CRT-D device.  He has a history of atrial fibrillation previously on amiodarone discontinued because of LFT abnormalities. He thinks this was stopped about 5 years ago. He also had a history of frequent ventricular ectopy for which Ranexa was initiated and ultimately discontinued.  He is also been seen intercurrently by the heart failure clinic. He is currently on Entresto  He has problems with dizziness sometimes in the morning. His blood pressures are low.  He has had some problems with variable weights. His OPTIVOL curve are somewhat variable     Past Medical History  Diagnosis Date  . Chronic systolic heart failure     a. Chronic LV systolic failure. b. Echo in Dec 2011 showed EF of 25% - f/u echo 02/2011: Mild LVH, EF 15%, posterior lateral akinesis, inferior akinesis, grade 1 diastolic dysfunction, mild LAE. c. s/p BiV pacemaker 06/2011.  [name] Atrial flutter     a. Off amiodarone because of increased LFTs  . COPD (chronic obstructive pulmonary disease)     Chronic SOB, O2 dependent  . Myocardial infarction 1994  . Gastro - esophageal reflux   . Claudication   . BPH (benign prostatic hyperplasia)   . Coronary artery disease     a. Myocardial infarction in 1984,  99 and 07. b. H/o multiple RCA stents. c.  LHC 04/2011: without obstructive disease. oLM 25%, mLAD 30%, pCFX 25%, prox to mid RCA stent ok, dRCA 30% and 70%, pPDA 25%, mPL 25-30%, EF 25%.  [name] Hypertension   . Atrial fibrillation     a. On  Xarelto.  . Hemorrhoids   . Hyperlipidemia     statin intolerant 2/2 myalgias  . Hx of adenomatous colonic polyps   . Hx of colonoscopy   . Chronic renal insufficiency   . Biventricular cardiac pacemaker in situ 3.2013  . NSVT (nonsustained ventricular tachycardia)     a. 04/2012- 6 beats on tele.  [name] Shortness of breath   . Arthritis     hips ,back ,hands  . Iron deficiency anemia, unspecified 03/20/2013    Past Surgical History  Procedure Laterality Date  . Cardiac catheterization  2008  . Cataract extraction    . Salivary gland surgery    . Coronary angioplasty with stent placement    . Insert / replace / remove pacemaker  06/16/2011  . Tee without cardioversion  05/08/2012    Procedure: TRANSESOPHAGEAL ECHOCARDIOGRAM (TEE);  Surgeon: Vesta Mixer, MD;  Location: [facility] ENDOSCOPY;  Service: Cardiovascular;  Laterality: N/A;  . Bi-ventricular pacemaker insertion N/A 06/16/2011    Procedure: BI-VENTRICULAR PACEMAKER INSERTION (CRT-P);  Surgeon: Duke Salvia, MD;  Location: [facility] CATH LAB;  Service: Cardiovascular;  Laterality: N/A;    Current Outpatient Prescriptions  Medication Sig Dispense Refill  . albuterol (PROAIR HFA) 108 (90 BASE) MCG/ACT inhaler Inhale 1 to 2 puffs everys 6 hours as needed 3 each 0  . albuterol (PROVENTIL) (5 MG/ML) 0.5% nebulizer solution Take 0.5 mLs (2.5 mg total)  by nebulization every 6 (six) hours as needed for wheezing or shortness of breath. 20 mL 0  . ALPRAZolam (XANAX) 0.25 MG tablet Take 0.25 mg by mouth daily as needed. For anxiety.    [name] azelastine (ASTELIN) 0.1 % nasal spray Place 1 spray into both nostrils 2 (two) times daily. Use in each nostril as directed 30 mL 12  . carvedilol (COREG) 25 MG tablet TAKE 1/2 TABLET TWICE A DAY WITH MEALS --12.5 mg twice daily    . esomeprazole (NEXIUM) 40 MG capsule TAKE 1 CAPSULE TWICE A DAY 180 capsule 2  . Fluticasone-Salmeterol (ADVAIR) 250-50 MCG/DOSE AEPB Inhale 1 puff into the lungs 2 (two) times daily.      . furosemide (LASIX) 40 MG tablet Take 40 mg by mouth in the pm.Take 1 extra tablet for weight greater than 180lb. 135 tablet 2  . furosemide (LASIX) 80 MG tablet Take 1 tab by mouth  Daily (80 mg)  in AM 90 tablet 2  . guaiFENesin (MUCINEX) 600 MG 12 hr tablet Take 1,200 mg by mouth 2 (two) times daily.     [name] HYDROcodone-acetaminophen (NORCO/VICODIN) 5-325 MG per tablet Take 1 tablet by mouth every 6 (six) hours. Arthritis pain    . ipratropium-albuterol (DUONEB) 0.5-2.5 (3) MG/3ML SOLN Take 3 mLs by nebulization 3 (three) times daily. 120 mL 11  . meclizine (ANTIVERT) 25 MG tablet Take 12.5-25 mg by mouth 3 (three) times daily as needed for dizziness.    . nitroGLYCERIN (NITROSTAT) 0.4 MG SL tablet Place 1 tablet (0.4 mg total) under the tongue every 5 (five) minutes as needed for chest pain. 25 tablet 1  . NON FORMULARY Place 2 L into the nose at bedtime as needed (Oxygen).     . potassium chloride SA (K-DUR,KLOR-CON) 20 MEQ tablet Take 1 tablet (20 mEq total) by mouth daily. 90 tablet 3  . PROAIR HFA 108 (90 BASE) MCG/ACT inhaler USE 1 TO 2 INHALATIONS EVERY 6 HOURS AS NEEDED 3 each 1  . rivaroxaban (XARELTO) 20 MG TABS tablet Take 1 tablet (20 mg total) by mouth daily with supper. 90 tablet 3  . sacubitril-valsartan (ENTRESTO) 24-26 MG Take 1 tablet by mouth 2 (two) times daily. 180 tablet 3  . Tamsulosin HCl (FLOMAX) 0.4 MG CAPS Take 0.4 mg by mouth 2 (two) times daily.     . [DISCONTINUED] metoprolol tartrate (LOPRESSOR) 25 MG tablet Take 1/2 tablet every other day  45 tablet 3   No current facility-administered medications for this visit.    Allergies  Allergen Reactions  . Codeine Nausea And Vomiting  . Inspra [Eplerenone] Other (See Comments)    Stomach problems  . Spironolactone Other (See Comments)    Gynecomastia  . Atorvastatin Other (See Comments)    Arthritis/pain  . Crestor [Rosuvastatin] Other (See Comments)    Arthritis/pain ("couldn't hardly walk")  . Doxycycline  Itching and Rash  . Simvastatin Other (See Comments)    Arthritis/pain  . Sulfonamide Derivatives Itching and Rash    Review of Systems negative except from HPI and PMH  Physical Exam BP 108/60 mmHg  Pulse 70  Ht  (1.778 m)  Wt 183 lb 3.2 oz (83.099 kg)  BMI 26.29 kg/m2 Well developed and well nourished in no acute distress HENT normal E scleral and icterus clear Neck Supple JVP 7-8  +HJR carotids brisk and full Clear to ausculation  *Regular rate and rhythm, 2/6 systolic  Soft with active bowel sounds No clubbing cyanosis  1+  Edema Alert and oriented, grossly normal motor and sensory function Skin Warm and Dry  ECG was ordered today and demonstrates AV pacing    Assessment and  Plan Congestive heart failure  chronic-systolic  Ischemic cardiomyopathy  Hypertension now with hypotension  PVCs  Quiescent  Implantable defibrillator-Medtronic The patient's device was interrogated and the information was fully reviewed.  The device was not reprogrammed. Notably however, optivol was increased coincidental with loss of ventricular pacing which I interpret as the onset of ventricular ectopy  Overall he is doing relatively well. I am somewhat perplexed by the fact that he has 95-97% ventricular pacing and I don't see PVCs like we have previously. He will continue on his current regime. If heart failure again ensues, we could consider AV optimization

## 2014-06-14 NOTE — Patient Instructions (Addendum)
Your physician recommends that you continue on your current medications as directed. Please refer to the Current Medication list given to you today.  Remote monitoring is used to monitor your pacemaker from home. This monitoring reduces the number of office visits required to check your device to one time per year. It allows us to keep an eye on the functioning of your device to ensure it is working properly. You are scheduled for a device check from home on 09-16-2014. You may send your transmission at any time that day. If you have a wireless device, the transmission will be sent automatically. After your physician reviews your transmission, you will receive a postcard with your next transmission date.  Your physician recommends that you schedule a follow-up appointment in: 12 months with Dr.Klein

## 2014-06-18 LAB — MDC_IDC_ENUM_SESS_TYPE_INCLINIC
Brady Statistic AP VS Percent: 1.1 %
Brady Statistic AS VP Percent: 53.8 %
Brady Statistic AS VS Percent: 2.2 %
Lead Channel Impedance Value: 513 Ohm
Lead Channel Impedance Value: 741 Ohm
Lead Channel Pacing Threshold Amplitude: 0.75 V
Lead Channel Pacing Threshold Pulse Width: 0.4 ms
Lead Channel Sensing Intrinsic Amplitude: 2.5 mV
Lead Channel Sensing Intrinsic Amplitude: 7.3 mV
Lead Channel Setting Pacing Amplitude: 2 V
Lead Channel Setting Pacing Amplitude: 2.5 V
Lead Channel Setting Pacing Pulse Width: 0.4 ms
Lead Channel Setting Pacing Pulse Width: 0.4 ms
Lead Channel Setting Sensing Sensitivity: 0.9 mV
MDC IDC MSMT BATTERY VOLTAGE: 3 V
MDC IDC MSMT LEADCHNL LV PACING THRESHOLD AMPLITUDE: 1 V
MDC IDC MSMT LEADCHNL LV PACING THRESHOLD PULSEWIDTH: 0.4 ms
MDC IDC MSMT LEADCHNL RA PACING THRESHOLD PULSEWIDTH: 0.4 ms
MDC IDC MSMT LEADCHNL RV IMPEDANCE VALUE: 475 Ohm
MDC IDC MSMT LEADCHNL RV PACING THRESHOLD AMPLITUDE: 0.5 V
MDC IDC SET LEADCHNL RA PACING AMPLITUDE: 2 V
MDC IDC STAT BRADY AP VP PERCENT: 43 %
Zone Setting Detection Interval: 350 ms
Zone Setting Detection Interval: 400 ms

## 2014-07-10 ENCOUNTER — Ambulatory Visit (HOSPITAL_BASED_OUTPATIENT_CLINIC_OR_DEPARTMENT_OTHER): Payer: Medicare Other | Admitting: Family

## 2014-07-10 ENCOUNTER — Other Ambulatory Visit (HOSPITAL_BASED_OUTPATIENT_CLINIC_OR_DEPARTMENT_OTHER): Payer: Medicare Other

## 2014-07-10 ENCOUNTER — Encounter: Payer: Self-pay | Admitting: Family

## 2014-07-10 DIAGNOSIS — D509 Iron deficiency anemia, unspecified: Secondary | ICD-10-CM | POA: Diagnosis not present

## 2014-07-10 LAB — RETICULOCYTES (CHCC)
ABS Retic: 32.5 10*3/uL (ref 19.0–186.0)
RBC.: 4.06 MIL/uL — ABNORMAL LOW (ref 4.22–5.81)
Retic Ct Pct: 0.8 % (ref 0.4–2.3)

## 2014-07-10 LAB — CBC WITH DIFFERENTIAL (CANCER CENTER ONLY)
BASO#: 0 10*3/uL (ref 0.0–0.2)
BASO%: 0.3 % (ref 0.0–2.0)
EOS%: 4.3 % (ref 0.0–7.0)
Eosinophils Absolute: 0.3 10*3/uL (ref 0.0–0.5)
HEMATOCRIT: 39.2 % (ref 38.7–49.9)
HGB: 13 g/dL (ref 13.0–17.1)
LYMPH#: 0.8 10*3/uL — AB (ref 0.9–3.3)
LYMPH%: 12.5 % — ABNORMAL LOW (ref 14.0–48.0)
MCH: 32.4 pg (ref 28.0–33.4)
MCHC: 33.2 g/dL (ref 32.0–35.9)
MCV: 98 fL (ref 82–98)
MONO#: 0.6 10*3/uL (ref 0.1–0.9)
MONO%: 10.2 % (ref 0.0–13.0)
NEUT#: 4.4 10*3/uL (ref 1.5–6.5)
NEUT%: 72.7 % (ref 40.0–80.0)
Platelets: 221 10*3/uL (ref 145–400)
RBC: 4.01 10*6/uL — ABNORMAL LOW (ref 4.20–5.70)
RDW: 13.9 % (ref 11.1–15.7)
WBC: 6.1 10*3/uL (ref 4.0–10.0)

## 2014-07-10 LAB — CHCC SATELLITE - SMEAR

## 2014-07-10 NOTE — Progress Notes (Signed)
Hematology and Oncology Follow Up Visit  Andrew [name] [id number] [birth year] 81 y.o. 07/10/2014   Principle Diagnosis:  Iron deficiency anemia  Erythropoitin deficiency  Current Therapy:   IV iron as indicated    Interim History: Andrew [name] is here today with his wife for a follow-up. He is doing well and enjoying working in his yard. He has been laying mulch this week.  He has SOB with exertion due to CHF and COPD. He also has chronic arthritic pain in his back and hips. He denies fever, chills, n/v, rash, headache, dizziness, blurred vision, chest pain, palpitations, abdominal pain, constipation, diarrhea, blood in urine or stool.  He has swelling in his ankles that comes and goes. This looks ok today. He takes lasix daily. No tenderness, numbness or tingling in his extremities. No new aches or pains.  His appetite is good and he is staying hydrated. His weight is stable.   Medications:    Medication List       This list is accurate as of: 07/10/14  3:20 PM.  Always use your most recent med list.               albuterol 108 (90 BASE) MCG/ACT inhaler  Commonly known as:  PROAIR HFA  Inhale 1 to 2 puffs everys 6 hours as needed     albuterol (5 MG/ML) 0.5% nebulizer solution  Commonly known as:  PROVENTIL  Take 0.5 mLs (2.5 mg total) by nebulization every 6 (six) hours as needed for wheezing or shortness of breath.     ALPRAZolam 0.25 MG tablet  Commonly known as:  XANAX  Take 0.25 mg by mouth daily as needed. For anxiety.     azelastine 0.1 % nasal spray  Commonly known as:  ASTELIN  Place 1 spray into both nostrils 2 (two) times daily. Use in each nostril as directed     carvedilol 25 MG tablet  Commonly known as:  COREG  TAKE 1/2 TABLET TWICE A DAY WITH MEALS --12.5 mg twice daily     esomeprazole 40 MG capsule  Commonly known as:  NEXIUM  TAKE 1 CAPSULE TWICE A DAY     Fluticasone-Salmeterol 250-50 MCG/DOSE Aepb  Commonly known as:  ADVAIR  Inhale 1 puff  into the lungs 2 (two) times daily.     furosemide 80 MG tablet  Commonly known as:  LASIX  Take 1 tab by mouth  Daily (80 mg)  in AM     furosemide 40 MG tablet  Commonly known as:  LASIX  Take 40 mg by mouth in the pm.Take 1 extra tablet for weight greater than 180lb.     guaiFENesin 600 MG 12 hr tablet  Commonly known as:  MUCINEX  Take 1,200 mg by mouth 2 (two) times daily.     HYDROcodone-acetaminophen 5-325 MG per tablet  Commonly known as:  NORCO/VICODIN  Take 1 tablet by mouth every 6 (six) hours. Arthritis pain     ipratropium-albuterol 0.5-2.5 (3) MG/3ML Soln  Commonly known as:  DUONEB  Take 3 mLs by nebulization 3 (three) times daily.     meclizine 25 MG tablet  Commonly known as:  ANTIVERT  Take 12.5-25 mg by mouth 3 (three) times daily as needed for dizziness.     nitroGLYCERIN 0.4 MG SL tablet  Commonly known as:  NITROSTAT  Place 1 tablet (0.4 mg total) under the tongue every 5 (five) minutes as needed for chest pain.     NON FORMULARY  Place 2 L into the nose at bedtime as needed (Oxygen).     potassium chloride SA 20 MEQ tablet  Commonly known as:  K-DUR,KLOR-CON  Take 1 tablet (20 mEq total) by mouth daily.     rivaroxaban 20 MG Tabs tablet  Commonly known as:  XARELTO  Take 1 tablet (20 mg total) by mouth daily with supper.     sacubitril-valsartan 24-26 MG  Commonly known as:  ENTRESTO  Take 1 tablet by mouth 2 (two) times daily.     tamsulosin 0.4 MG Caps capsule  Commonly known as:  FLOMAX  Take 0.4 mg by mouth 2 (two) times daily.        Allergies:  Allergies  Allergen Reactions  . Codeine Nausea And Vomiting  . Inspra [Eplerenone] Other (See Comments)    Stomach problems  . Spironolactone Other (See Comments)    Gynecomastia  . Atorvastatin Other (See Comments)    Arthritis/pain  . Crestor [Rosuvastatin] Other (See Comments)    Arthritis/pain ("couldn't hardly walk")  . Doxycycline Itching and Rash  . Simvastatin Other (See  Comments)    Arthritis/pain  . Sulfonamide Derivatives Itching and Rash    Past Medical History, Surgical history, Social history, and Family History were reviewed and updated.  Review of Systems: All other 10 point review of systems is negative.   Physical Exam:  vitals were not taken for this visit.  Wt Readings from Last 3 Encounters:  06/14/14 183 lb 3.2 oz (83.099 kg)  05/13/14 182 lb (82.555 kg)  05/03/14 186 lb (84.369 kg)    Ocular: Sclerae unicteric, pupils equal, round and reactive to light Ear-nose-throat: Oropharynx clear, dentition fair Lymphatic: No cervical or supraclavicular adenopathy Lungs no rales or rhonchi, good excursion bilaterally Heart regular rate and rhythm, no murmur appreciated Abd soft, nontender, positive bowel sounds MSK no focal spinal tenderness, no joint edema Neuro: non-focal, well-oriented, appropriate affect  Lab Results  Component Value Date   WBC 6.1 07/10/2014   HGB 13.0 07/10/2014   HCT 39.2 07/10/2014   MCV 98 07/10/2014   PLT 221 07/10/2014   Lab Results  Component Value Date   FERRITIN 428* 04/10/2014   IRON 56 04/10/2014   TIBC 156* 04/10/2014   UIBC 101* 04/10/2014   IRONPCTSAT 36 04/10/2014   Lab Results  Component Value Date   RETICCTPCT 0.8 04/10/2014   RBC 4.01* 07/10/2014   RETICCTABS 32.9 04/10/2014   No results found for: [facility] Lab Results  Component Value Date   IGGSERUM 1140 12/15/2012   IGA 338 12/15/2012   IGMSERUM 165 12/15/2012   Lab Results  Component Value Date   TOTALPROTELP 6.7 12/15/2012   ALBUMINELP 49.7* 12/15/2012   A1GS 7.7* 12/15/2012   A2GS 14.8* 12/15/2012   BETS 5.9 12/15/2012   BETA2SER 5.3 12/15/2012   GAMS 16.6 12/15/2012   MSPIKE NOT DET 12/15/2012   SPEI * 12/15/2012     Chemistry      Component Value Date/Time   NA 137 02/26/2014 1027   NA 136 01/17/2013 1337   K 4.2 02/26/2014 1027   K 4.5 01/17/2013 1337   CL 101 02/26/2014 1027     CL 97* 01/17/2013 1337   CO2 23 02/26/2014 1027   CO2 31 01/17/2013 1337   BUN 17 02/26/2014 1027   BUN 18 01/17/2013 1337   CREATININE 1.32 02/26/2014 1027   CREATININE 1.43* 11/12/2013 1019      Component Value Date/Time   CALCIUM 8.6  02/26/2014 1027   CALCIUM 8.9 01/17/2013 1337   ALKPHOS 70 08/13/2013 1149   AST 14 08/13/2013 1149   ALT <8 08/13/2013 1149   BILITOT 0.6 08/13/2013 1149     Impression and Plan: Andrew [name] is a very pleasant 81 year old gentleman with both iron deficiency and a low erythropoietin level. His last iron infusion was in July. He is doing well and is asymptomatic at this time.  His Hgb is 13.0 MCV 96. We will see what his iron studies show.  We will see him back in 3 months for labs and follow-up.  He knows to call here with any questions or concerns and to go to the ED in the event of an emergency. We can certainly see him sooner if need be.   Verdie Mosher, NP 4/6/20163:20 PM

## 2014-07-11 LAB — IRON AND TIBC CHCC
%SAT: 32 % (ref 20–55)
IRON: 53 ug/dL (ref 42–163)
TIBC: 165 ug/dL — ABNORMAL LOW (ref 202–409)
UIBC: 112 ug/dL — ABNORMAL LOW (ref 117–376)

## 2014-07-11 LAB — FERRITIN CHCC: FERRITIN: 433 ng/mL — AB (ref 22–316)

## 2014-07-28 ENCOUNTER — Other Ambulatory Visit: Payer: Self-pay | Admitting: Nurse Practitioner

## 2014-08-09 ENCOUNTER — Other Ambulatory Visit: Payer: Self-pay | Admitting: Cardiology

## 2014-08-09 NOTE — Telephone Encounter (Signed)
Dosage change on 8.13.15

## 2014-09-09 ENCOUNTER — Ambulatory Visit (INDEPENDENT_AMBULATORY_CARE_PROVIDER_SITE_OTHER): Payer: Medicare Other | Admitting: Family Medicine

## 2014-09-09 VITALS — BP 132/78 | HR 83 | Temp 98.0°F | Resp 18 | Ht 70.0 in | Wt 187.6 lb

## 2014-09-09 DIAGNOSIS — I255 Ischemic cardiomyopathy: Secondary | ICD-10-CM

## 2014-09-09 DIAGNOSIS — I5022 Chronic systolic (congestive) heart failure: Secondary | ICD-10-CM | POA: Diagnosis not present

## 2014-09-09 DIAGNOSIS — R0600 Dyspnea, unspecified: Secondary | ICD-10-CM

## 2014-09-09 DIAGNOSIS — D508 Other iron deficiency anemias: Secondary | ICD-10-CM

## 2014-09-09 LAB — POCT CBC
Granulocyte percent: 80.6 %G — AB (ref 37–80)
HCT, POC: 37.5 % — AB (ref 43.5–53.7)
Hemoglobin: 12.5 g/dL — AB (ref 14.1–18.1)
Lymph, poc: 1.1 (ref 0.6–3.4)
MCH: 31.6 pg — AB (ref 27–31.2)
MCHC: 33.4 g/dL (ref 31.8–35.4)
MCV: 94.6 fL (ref 80–97)
MID (CBC): 0.7 (ref 0–0.9)
MPV: 7.3 fL (ref 0–99.8)
POC GRANULOCYTE: 7.3 — AB (ref 2–6.9)
POC LYMPH %: 12.1 % (ref 10–50)
POC MID %: 7.3 %M (ref 0–12)
Platelet Count, POC: 278 10*3/uL (ref 142–424)
RBC: 3.96 M/uL — AB (ref 4.69–6.13)
RDW, POC: 14.4 %
WBC: 9 10*3/uL (ref 4.6–10.2)

## 2014-09-09 NOTE — Progress Notes (Signed)
  Subjective:  Patient ID: Andrew [name], male    DOB: [birth year]  Age: 81 y.o. MRN: [id number]  81 year old patient who goes to a number of doctors. He has problems with severe chronic back pain which limits his mobility and activity. Today he walked to the back of the grocery store to get some milk and was completely worn out. He has concerns because the last medication he was placed on for his congestive heart failure can cause hyperkalemia. He is on potassium. He does take Lasix and he tamsulosin for his prostate. Is on Coreg also. His blood pressures been low the last few mornings at 100. He cannot do much yard work any longer. He saw his hematologist a few months ago was told that his hemoglobin was very good, that his anemia was not a current issue. He denies any chest pains. Only rarely has palpitations. He has a pacemaker which is monitored regularly. He has not been swelling in his legs. He does wear compression hose. He was divorced by his previous wife and his married currently, but says he just came do the work around the place that he used to.   Objective:   Alert and oriented in no major acute distress. I got a blood pressure 158/78. He has a regular heart rate. Chest clear. No ankle edema. Moves slowly because of his back pain.  Assessment & Plan:   Assessment: Fatigue and dyspnea on exertion History of chronic congestive heart fire Chronic back pain History of anemia, controlled  Plan: Cbc, cmp, cnp Results for orders placed or performed in visit on 09/09/14  POCT CBC  Result Value Ref Range   WBC 9.0 4.6 - 10.2 K/uL   Lymph, poc 1.1 0.6 - 3.4   POC LYMPH PERCENT 12.1 10 - 50 %L   MID (cbc) 0.7 0 - 0.9   POC MID % 7.3 0 - 12 %M   POC Granulocyte 7.3 (A) 2 - 6.9   Granulocyte percent 80.6 (A) 37 - 80 %G   RBC 3.96 (A) 4.69 - 6.13 M/uL   Hemoglobin 12.5 (A) 14.1 - 18.1 g/dL   HCT, POC 40.937.5 (A) 81.143.5 - 53.7 %   MCV 94.6 80 - 97 fL   MCH, POC 31.6 (A) 27 - 31.2 pg   MCHC 33.4 31.8 - 35.4 g/dL   RDW, POC 91.414.4 %   Platelet Count, POC 278 142 - 424 K/uL   MPV 7.3 0 - 99.8 fL     Discussed the fact that most of his conditions really don't have much room for change in any of his therapies. He will need to mostly live with what he has at this point. We will check labs to make sure nothing is gone out of line on the current medications. Reassurance. He is content with that. Urged him to drink more water. Patient Instructions  Continue same medicines  Drink more fluids in this hot weather.   I will let you know the results of your labs.    Caterra Ostroff, MD 09/09/2014

## 2014-09-09 NOTE — Patient Instructions (Addendum)
Continue same medicines  Drink more fluids in this hot weather.   I will let you know the results of your labs.

## 2014-09-10 LAB — COMPREHENSIVE METABOLIC PANEL
ALBUMIN: 4 g/dL (ref 3.5–5.2)
ALK PHOS: 68 U/L (ref 39–117)
ALT: 8 U/L (ref 0–53)
AST: 13 U/L (ref 0–37)
BUN: 28 mg/dL — ABNORMAL HIGH (ref 6–23)
CALCIUM: 9 mg/dL (ref 8.4–10.5)
CO2: 25 mEq/L (ref 19–32)
Chloride: 102 mEq/L (ref 96–112)
Creat: 1.61 mg/dL — ABNORMAL HIGH (ref 0.50–1.35)
Glucose, Bld: 99 mg/dL (ref 70–99)
Potassium: 4.9 mEq/L (ref 3.5–5.3)
Sodium: 136 mEq/L (ref 135–145)
Total Bilirubin: 0.4 mg/dL (ref 0.2–1.2)
Total Protein: 7.1 g/dL (ref 6.0–8.3)

## 2014-09-11 LAB — BRAIN NATRIURETIC PEPTIDE: BRAIN NATRIURETIC PEPTIDE: 300.2 pg/mL — AB (ref 0.0–100.0)

## 2014-09-16 ENCOUNTER — Ambulatory Visit (INDEPENDENT_AMBULATORY_CARE_PROVIDER_SITE_OTHER): Payer: Medicare Other | Admitting: *Deleted

## 2014-09-16 ENCOUNTER — Encounter: Payer: Self-pay | Admitting: Internal Medicine

## 2014-09-16 DIAGNOSIS — I480 Paroxysmal atrial fibrillation: Secondary | ICD-10-CM

## 2014-09-16 NOTE — Progress Notes (Signed)
Remote pacemaker transmission.   

## 2014-09-18 LAB — CUP PACEART REMOTE DEVICE CHECK
Brady Statistic AP VP Percent: 58.45 %
Brady Statistic AP VS Percent: 1.31 %
Brady Statistic RV Percent Paced: 97.35 %
Lead Channel Impedance Value: 1026 Ohm
Lead Channel Impedance Value: 399 Ohm
Lead Channel Impedance Value: 513 Ohm
Lead Channel Impedance Value: 722 Ohm
Lead Channel Pacing Threshold Amplitude: 0.5 V
Lead Channel Pacing Threshold Amplitude: 0.625 V
Lead Channel Pacing Threshold Pulse Width: 0.4 ms
Lead Channel Pacing Threshold Pulse Width: 0.4 ms
Lead Channel Sensing Intrinsic Amplitude: 2 mV
Lead Channel Sensing Intrinsic Amplitude: 2 mV
Lead Channel Setting Pacing Amplitude: 2 V
Lead Channel Setting Pacing Amplitude: 2.5 V
Lead Channel Setting Pacing Pulse Width: 0.4 ms
MDC IDC MSMT BATTERY REMAINING LONGEVITY: 51 mo
MDC IDC MSMT BATTERY VOLTAGE: 3 V
MDC IDC MSMT LEADCHNL LV IMPEDANCE VALUE: 1121 Ohm
MDC IDC MSMT LEADCHNL LV IMPEDANCE VALUE: 1444 Ohm
MDC IDC MSMT LEADCHNL LV IMPEDANCE VALUE: 608 Ohm
MDC IDC MSMT LEADCHNL RA IMPEDANCE VALUE: 380 Ohm
MDC IDC MSMT LEADCHNL RV IMPEDANCE VALUE: 456 Ohm
MDC IDC MSMT LEADCHNL RV SENSING INTR AMPL: 28.5 mV
MDC IDC MSMT LEADCHNL RV SENSING INTR AMPL: 28.5 mV
MDC IDC SESS DTM: 20160613142102
MDC IDC SET LEADCHNL LV PACING AMPLITUDE: 2 V
MDC IDC SET LEADCHNL RV PACING PULSEWIDTH: 0.4 ms
MDC IDC SET LEADCHNL RV SENSING SENSITIVITY: 0.9 mV
MDC IDC SET ZONE DETECTION INTERVAL: 400 ms
MDC IDC STAT BRADY AS VP PERCENT: 38.91 %
MDC IDC STAT BRADY AS VS PERCENT: 1.33 %
MDC IDC STAT BRADY RA PERCENT PACED: 59.76 %
Zone Setting Detection Interval: 350 ms

## 2014-10-02 ENCOUNTER — Encounter: Payer: Self-pay | Admitting: Cardiology

## 2014-10-10 ENCOUNTER — Other Ambulatory Visit: Payer: Medicare Other

## 2014-10-10 ENCOUNTER — Encounter (HOSPITAL_COMMUNITY): Payer: Self-pay

## 2014-10-10 ENCOUNTER — Inpatient Hospital Stay (HOSPITAL_COMMUNITY): Payer: Medicare Other

## 2014-10-10 ENCOUNTER — Inpatient Hospital Stay (HOSPITAL_COMMUNITY)
Admission: EM | Admit: 2014-10-10 | Discharge: 2014-11-04 | DRG: 870 | Disposition: E | Payer: Medicare Other | Attending: Internal Medicine | Admitting: Internal Medicine

## 2014-10-10 ENCOUNTER — Ambulatory Visit: Payer: Medicare Other | Admitting: Hematology & Oncology

## 2014-10-10 ENCOUNTER — Emergency Department (HOSPITAL_COMMUNITY): Payer: Medicare Other

## 2014-10-10 DIAGNOSIS — J9600 Acute respiratory failure, unspecified whether with hypoxia or hypercapnia: Secondary | ICD-10-CM | POA: Diagnosis present

## 2014-10-10 DIAGNOSIS — Z66 Do not resuscitate: Secondary | ICD-10-CM | POA: Diagnosis present

## 2014-10-10 DIAGNOSIS — I129 Hypertensive chronic kidney disease with stage 1 through stage 4 chronic kidney disease, or unspecified chronic kidney disease: Secondary | ICD-10-CM | POA: Diagnosis present

## 2014-10-10 DIAGNOSIS — N179 Acute kidney failure, unspecified: Secondary | ICD-10-CM

## 2014-10-10 DIAGNOSIS — I4901 Ventricular fibrillation: Secondary | ICD-10-CM | POA: Diagnosis not present

## 2014-10-10 DIAGNOSIS — Z87891 Personal history of nicotine dependence: Secondary | ICD-10-CM

## 2014-10-10 DIAGNOSIS — R197 Diarrhea, unspecified: Secondary | ICD-10-CM | POA: Diagnosis not present

## 2014-10-10 DIAGNOSIS — J189 Pneumonia, unspecified organism: Secondary | ICD-10-CM | POA: Diagnosis not present

## 2014-10-10 DIAGNOSIS — Z79899 Other long term (current) drug therapy: Secondary | ICD-10-CM | POA: Diagnosis not present

## 2014-10-10 DIAGNOSIS — I4892 Unspecified atrial flutter: Secondary | ICD-10-CM | POA: Diagnosis present

## 2014-10-10 DIAGNOSIS — Z881 Allergy status to other antibiotic agents status: Secondary | ICD-10-CM

## 2014-10-10 DIAGNOSIS — J411 Mucopurulent chronic bronchitis: Secondary | ICD-10-CM | POA: Diagnosis not present

## 2014-10-10 DIAGNOSIS — Z4659 Encounter for fitting and adjustment of other gastrointestinal appliance and device: Secondary | ICD-10-CM

## 2014-10-10 DIAGNOSIS — Z9981 Dependence on supplemental oxygen: Secondary | ICD-10-CM | POA: Diagnosis not present

## 2014-10-10 DIAGNOSIS — Z7901 Long term (current) use of anticoagulants: Secondary | ICD-10-CM | POA: Diagnosis not present

## 2014-10-10 DIAGNOSIS — J9621 Acute and chronic respiratory failure with hypoxia: Secondary | ICD-10-CM | POA: Diagnosis present

## 2014-10-10 DIAGNOSIS — Z978 Presence of other specified devices: Secondary | ICD-10-CM

## 2014-10-10 DIAGNOSIS — G92 Toxic encephalopathy: Secondary | ICD-10-CM | POA: Diagnosis present

## 2014-10-10 DIAGNOSIS — Z515 Encounter for palliative care: Secondary | ICD-10-CM

## 2014-10-10 DIAGNOSIS — E785 Hyperlipidemia, unspecified: Secondary | ICD-10-CM | POA: Diagnosis present

## 2014-10-10 DIAGNOSIS — Z955 Presence of coronary angioplasty implant and graft: Secondary | ICD-10-CM

## 2014-10-10 DIAGNOSIS — Z9911 Dependence on respirator [ventilator] status: Secondary | ICD-10-CM | POA: Diagnosis not present

## 2014-10-10 DIAGNOSIS — A419 Sepsis, unspecified organism: Principal | ICD-10-CM

## 2014-10-10 DIAGNOSIS — Z885 Allergy status to narcotic agent status: Secondary | ICD-10-CM | POA: Diagnosis not present

## 2014-10-10 DIAGNOSIS — I5023 Acute on chronic systolic (congestive) heart failure: Secondary | ICD-10-CM | POA: Diagnosis present

## 2014-10-10 DIAGNOSIS — I462 Cardiac arrest due to underlying cardiac condition: Secondary | ICD-10-CM | POA: Diagnosis present

## 2014-10-10 DIAGNOSIS — J9602 Acute respiratory failure with hypercapnia: Secondary | ICD-10-CM | POA: Diagnosis not present

## 2014-10-10 DIAGNOSIS — I472 Ventricular tachycardia: Secondary | ICD-10-CM | POA: Diagnosis present

## 2014-10-10 DIAGNOSIS — N183 Chronic kidney disease, stage 3 unspecified: Secondary | ICD-10-CM | POA: Diagnosis present

## 2014-10-10 DIAGNOSIS — I469 Cardiac arrest, cause unspecified: Secondary | ICD-10-CM | POA: Diagnosis not present

## 2014-10-10 DIAGNOSIS — I248 Other forms of acute ischemic heart disease: Secondary | ICD-10-CM | POA: Diagnosis present

## 2014-10-10 DIAGNOSIS — J9622 Acute and chronic respiratory failure with hypercapnia: Secondary | ICD-10-CM | POA: Diagnosis present

## 2014-10-10 DIAGNOSIS — I4891 Unspecified atrial fibrillation: Secondary | ICD-10-CM | POA: Diagnosis present

## 2014-10-10 DIAGNOSIS — R739 Hyperglycemia, unspecified: Secondary | ICD-10-CM | POA: Diagnosis present

## 2014-10-10 DIAGNOSIS — I2510 Atherosclerotic heart disease of native coronary artery without angina pectoris: Secondary | ICD-10-CM | POA: Diagnosis present

## 2014-10-10 DIAGNOSIS — Z8249 Family history of ischemic heart disease and other diseases of the circulatory system: Secondary | ICD-10-CM | POA: Diagnosis not present

## 2014-10-10 DIAGNOSIS — Z888 Allergy status to other drugs, medicaments and biological substances status: Secondary | ICD-10-CM | POA: Diagnosis not present

## 2014-10-10 DIAGNOSIS — R54 Age-related physical debility: Secondary | ICD-10-CM | POA: Diagnosis present

## 2014-10-10 DIAGNOSIS — J9690 Respiratory failure, unspecified, unspecified whether with hypoxia or hypercapnia: Secondary | ICD-10-CM

## 2014-10-10 DIAGNOSIS — E872 Acidosis: Secondary | ICD-10-CM | POA: Diagnosis present

## 2014-10-10 DIAGNOSIS — Z950 Presence of cardiac pacemaker: Secondary | ICD-10-CM | POA: Diagnosis not present

## 2014-10-10 DIAGNOSIS — I429 Cardiomyopathy, unspecified: Secondary | ICD-10-CM | POA: Diagnosis not present

## 2014-10-10 DIAGNOSIS — I255 Ischemic cardiomyopathy: Secondary | ICD-10-CM | POA: Diagnosis present

## 2014-10-10 DIAGNOSIS — E875 Hyperkalemia: Secondary | ICD-10-CM

## 2014-10-10 DIAGNOSIS — D688 Other specified coagulation defects: Secondary | ICD-10-CM | POA: Diagnosis present

## 2014-10-10 DIAGNOSIS — R911 Solitary pulmonary nodule: Secondary | ICD-10-CM | POA: Diagnosis not present

## 2014-10-10 DIAGNOSIS — K219 Gastro-esophageal reflux disease without esophagitis: Secondary | ICD-10-CM | POA: Diagnosis present

## 2014-10-10 DIAGNOSIS — R0602 Shortness of breath: Secondary | ICD-10-CM

## 2014-10-10 DIAGNOSIS — I252 Old myocardial infarction: Secondary | ICD-10-CM | POA: Diagnosis not present

## 2014-10-10 DIAGNOSIS — N400 Enlarged prostate without lower urinary tract symptoms: Secondary | ICD-10-CM | POA: Diagnosis present

## 2014-10-10 DIAGNOSIS — I10 Essential (primary) hypertension: Secondary | ICD-10-CM | POA: Diagnosis not present

## 2014-10-10 DIAGNOSIS — R6520 Severe sepsis without septic shock: Secondary | ICD-10-CM | POA: Diagnosis present

## 2014-10-10 DIAGNOSIS — J441 Chronic obstructive pulmonary disease with (acute) exacerbation: Secondary | ICD-10-CM | POA: Diagnosis present

## 2014-10-10 DIAGNOSIS — Z882 Allergy status to sulfonamides status: Secondary | ICD-10-CM | POA: Diagnosis not present

## 2014-10-10 DIAGNOSIS — J42 Unspecified chronic bronchitis: Secondary | ICD-10-CM | POA: Diagnosis not present

## 2014-10-10 DIAGNOSIS — D638 Anemia in other chronic diseases classified elsewhere: Secondary | ICD-10-CM | POA: Diagnosis present

## 2014-10-10 DIAGNOSIS — J9601 Acute respiratory failure with hypoxia: Secondary | ICD-10-CM | POA: Diagnosis not present

## 2014-10-10 DIAGNOSIS — Z9889 Other specified postprocedural states: Secondary | ICD-10-CM

## 2014-10-10 DIAGNOSIS — Z79891 Long term (current) use of opiate analgesic: Secondary | ICD-10-CM | POA: Diagnosis not present

## 2014-10-10 DIAGNOSIS — E870 Hyperosmolality and hypernatremia: Secondary | ICD-10-CM | POA: Diagnosis not present

## 2014-10-10 LAB — URINALYSIS, ROUTINE W REFLEX MICROSCOPIC
BILIRUBIN URINE: NEGATIVE
Glucose, UA: NEGATIVE mg/dL
Ketones, ur: NEGATIVE mg/dL
Leukocytes, UA: NEGATIVE
Nitrite: NEGATIVE
PROTEIN: 100 mg/dL — AB
Specific Gravity, Urine: 1.015 (ref 1.005–1.030)
UROBILINOGEN UA: 1 mg/dL (ref 0.0–1.0)
pH: 5 (ref 5.0–8.0)

## 2014-10-10 LAB — LACTIC ACID, PLASMA: Lactic Acid, Venous: 1.1 mmol/L (ref 0.5–2.0)

## 2014-10-10 LAB — I-STAT ARTERIAL BLOOD GAS, ED
Acid-base deficit: 3 mmol/L — ABNORMAL HIGH (ref 0.0–2.0)
Acid-base deficit: 3 mmol/L — ABNORMAL HIGH (ref 0.0–2.0)
Acid-base deficit: 6 mmol/L — ABNORMAL HIGH (ref 0.0–2.0)
BICARBONATE: 25 meq/L — AB (ref 20.0–24.0)
Bicarbonate: 26.3 mEq/L — ABNORMAL HIGH (ref 20.0–24.0)
Bicarbonate: 23.8 mEq/L (ref 20.0–24.0)
O2 Saturation: 99 %
O2 Saturation: 95 %
O2 Saturation: 90 %
PCO2 ART: 64.4 mmHg — AB (ref 35.0–45.0)
PCO2 ART: 71.6 mmHg — AB (ref 35.0–45.0)
PH ART: 7.219 — AB (ref 7.350–7.450)
PH ART: 7.227 — AB (ref 7.350–7.450)
PO2 ART: 200 mmHg — AB (ref 80.0–100.0)
PO2 ART: 81 mmHg (ref 80.0–100.0)
Patient temperature: 98.6
Patient temperature: 98.6
Patient temperature: 100.3
TCO2: 28 mmol/L (ref 0–100)
TCO2: 27 mmol/L (ref 0–100)
TCO2: 26 mmol/L (ref 0–100)
pCO2 arterial: 60.2 mmHg (ref 35.0–45.0)
pH, Arterial: 7.135 — CL (ref 7.350–7.450)
pO2, Arterial: 94 mmHg (ref 80.0–100.0)

## 2014-10-10 LAB — COMPREHENSIVE METABOLIC PANEL
ALBUMIN: 2.7 g/dL — AB (ref 3.5–5.0)
ALT: 13 U/L — ABNORMAL LOW (ref 17–63)
ALT: 14 U/L — ABNORMAL LOW (ref 17–63)
ANION GAP: 11 (ref 5–15)
ANION GAP: 9 (ref 5–15)
AST: 19 U/L (ref 15–41)
AST: 17 U/L (ref 15–41)
Albumin: 3.1 g/dL — ABNORMAL LOW (ref 3.5–5.0)
Alkaline Phosphatase: 79 U/L (ref 38–126)
Alkaline Phosphatase: 71 U/L (ref 38–126)
BILIRUBIN TOTAL: 0.6 mg/dL (ref 0.3–1.2)
BUN: 62 mg/dL — AB (ref 6–20)
BUN: 60 mg/dL — ABNORMAL HIGH (ref 6–20)
CALCIUM: 8.8 mg/dL — AB (ref 8.9–10.3)
CHLORIDE: 101 mmol/L (ref 101–111)
CO2: 24 mmol/L (ref 22–32)
CO2: 24 mmol/L (ref 22–32)
Calcium: 8.3 mg/dL — ABNORMAL LOW (ref 8.9–10.3)
Chloride: 105 mmol/L (ref 101–111)
Creatinine, Ser: 2.65 mg/dL — ABNORMAL HIGH (ref 0.61–1.24)
Creatinine, Ser: 2.33 mg/dL — ABNORMAL HIGH (ref 0.61–1.24)
GFR calc Af Amer: 24 mL/min — ABNORMAL LOW (ref >60)
GFR calc Af Amer: 28 mL/min — ABNORMAL LOW (ref >60)
GFR calc non Af Amer: 24 mL/min — ABNORMAL LOW (ref >60)
GFR, EST NON AFRICAN AMERICAN: 21 mL/min — AB (ref >60)
GLUCOSE: 146 mg/dL — AB (ref 65–99)
Glucose, Bld: 151 mg/dL — ABNORMAL HIGH (ref 65–99)
Potassium: 5.4 mmol/L — ABNORMAL HIGH (ref 3.5–5.1)
Potassium: 4.6 mmol/L (ref 3.5–5.1)
Sodium: 136 mmol/L (ref 135–145)
Sodium: 138 mmol/L (ref 135–145)
Total Bilirubin: 0.6 mg/dL (ref 0.3–1.2)
Total Protein: 8 g/dL (ref 6.5–8.1)
Total Protein: 6.8 g/dL (ref 6.5–8.1)

## 2014-10-10 LAB — GLUCOSE, CAPILLARY
GLUCOSE-CAPILLARY: 109 mg/dL — AB (ref 65–99)
GLUCOSE-CAPILLARY: 130 mg/dL — AB (ref 65–99)
Glucose-Capillary: 137 mg/dL — ABNORMAL HIGH (ref 65–99)
Glucose-Capillary: 116 mg/dL — ABNORMAL HIGH (ref 65–99)

## 2014-10-10 LAB — CBC WITH DIFFERENTIAL/PLATELET
Basophils Absolute: 0 10*3/uL (ref 0.0–0.1)
Basophils Relative: 0 % (ref 0–1)
Eosinophils Absolute: 0 10*3/uL (ref 0.0–0.7)
Eosinophils Relative: 0 % (ref 0–5)
HCT: 37.1 % — ABNORMAL LOW (ref 39.0–52.0)
Hemoglobin: 12.2 g/dL — ABNORMAL LOW (ref 13.0–17.0)
LYMPHS ABS: 0.6 10*3/uL — AB (ref 0.7–4.0)
LYMPHS PCT: 3 % — AB (ref 12–46)
MCH: 32.4 pg (ref 26.0–34.0)
MCHC: 32.9 g/dL (ref 30.0–36.0)
MCV: 98.7 fL (ref 78.0–100.0)
MONOS PCT: 4 % (ref 3–12)
Monocytes Absolute: 0.8 10*3/uL (ref 0.1–1.0)
NEUTROS PCT: 93 % — AB (ref 43–77)
Neutro Abs: 18.7 10*3/uL — ABNORMAL HIGH (ref 1.7–7.7)
Platelets: 251 10*3/uL (ref 150–400)
RBC: 3.76 MIL/uL — ABNORMAL LOW (ref 4.22–5.81)
RDW: 14 % (ref 11.5–15.5)
WBC: 20.1 10*3/uL — AB (ref 4.0–10.5)

## 2014-10-10 LAB — TYPE AND SCREEN
ABO/RH(D): O POS
Antibody Screen: NEGATIVE

## 2014-10-10 LAB — BLOOD GAS, ARTERIAL
ACID-BASE DEFICIT: 4.1 mmol/L — AB (ref 0.0–2.0)
Bicarbonate: 22.1 mEq/L (ref 20.0–24.0)
DRAWN BY: 313941
FIO2: 0.4 %
LHR: 25 {breaths}/min
O2 Saturation: 92.9 %
PATIENT TEMPERATURE: 100.3
PEEP/CPAP: 5 cmH2O
PO2 ART: 74.9 mmHg — AB (ref 80.0–100.0)
TCO2: 23.8 mmol/L (ref 0–100)
VT: 500 mL
pCO2 arterial: 55.7 mmHg — ABNORMAL HIGH (ref 35.0–45.0)
pH, Arterial: 7.23 — ABNORMAL LOW (ref 7.350–7.450)

## 2014-10-10 LAB — URINE MICROSCOPIC-ADD ON

## 2014-10-10 LAB — I-STAT TROPONIN, ED: Troponin i, poc: 0.04 ng/mL (ref 0.00–0.08)

## 2014-10-10 LAB — STREP PNEUMONIAE URINARY ANTIGEN: Strep Pneumo Urinary Antigen: NEGATIVE

## 2014-10-10 LAB — I-STAT CG4 LACTIC ACID, ED
Lactic Acid, Venous: 0.68 mmol/L (ref 0.5–2.0)
Lactic Acid, Venous: 0.94 mmol/L (ref 0.5–2.0)

## 2014-10-10 LAB — PROTIME-INR
INR: 2.28 — ABNORMAL HIGH (ref 0.00–1.49)
PROTHROMBIN TIME: 24.9 s — AB (ref 11.6–15.2)

## 2014-10-10 LAB — TRIGLYCERIDES: Triglycerides: 130 mg/dL (ref <150)

## 2014-10-10 LAB — AMYLASE: AMYLASE: 31 U/L (ref 28–100)

## 2014-10-10 LAB — PROCALCITONIN: Procalcitonin: 1.98 ng/mL

## 2014-10-10 LAB — APTT: APTT: 40 s — AB (ref 24–37)

## 2014-10-10 LAB — TROPONIN I
TROPONIN I: 0.12 ng/mL — AB (ref <0.031)
Troponin I: 0.05 ng/mL — ABNORMAL HIGH (ref <0.031)
Troponin I: 0.04 ng/mL — ABNORMAL HIGH (ref <0.031)

## 2014-10-10 LAB — BRAIN NATRIURETIC PEPTIDE: B Natriuretic Peptide: 500.1 pg/mL — ABNORMAL HIGH (ref 0.0–100.0)

## 2014-10-10 LAB — MAGNESIUM: Magnesium: 2.2 mg/dL (ref 1.7–2.4)

## 2014-10-10 LAB — PHOSPHORUS: Phosphorus: 4.9 mg/dL — ABNORMAL HIGH (ref 2.5–4.6)

## 2014-10-10 LAB — ABO/RH: ABO/RH(D): O POS

## 2014-10-10 LAB — MRSA PCR SCREENING: MRSA BY PCR: NEGATIVE

## 2014-10-10 LAB — CORTISOL: CORTISOL PLASMA: 40 ug/dL

## 2014-10-10 LAB — LIPASE, BLOOD: Lipase: 39 U/L (ref 22–51)

## 2014-10-10 MED ORDER — FREE WATER
100.0000 mL | Freq: Three times a day (TID) | Status: DC
  Administered 2014-10-10 – 2014-10-13 (×9): 100 mL

## 2014-10-10 MED ORDER — VANCOMYCIN HCL IN DEXTROSE 750-5 MG/150ML-% IV SOLN
750.0000 mg | INTRAVENOUS | Status: DC
  Administered 2014-10-11: 750 mg via INTRAVENOUS
  Filled 2014-10-10: qty 150

## 2014-10-10 MED ORDER — SUCCINYLCHOLINE CHLORIDE 20 MG/ML IJ SOLN
INTRAMUSCULAR | Status: AC
  Filled 2014-10-10: qty 1

## 2014-10-10 MED ORDER — VANCOMYCIN HCL IN DEXTROSE 1-5 GM/200ML-% IV SOLN
1000.0000 mg | Freq: Once | INTRAVENOUS | Status: AC
  Administered 2014-10-10: 1000 mg via INTRAVENOUS
  Filled 2014-10-10: qty 200

## 2014-10-10 MED ORDER — DEXTROSE 5 % IV SOLN: 1.0000 g | Freq: Once | INTRAVENOUS | Status: DC

## 2014-10-10 MED ORDER — PRO-STAT SUGAR FREE PO LIQD
30.0000 mL | Freq: Two times a day (BID) | ORAL | Status: DC
  Filled 2014-10-10 (×2): qty 30

## 2014-10-10 MED ORDER — PROPOFOL 1000 MG/100ML IV EMUL
0.0000 ug/kg/min | INTRAVENOUS | Status: DC
  Administered 2014-10-10: 10 ug/kg/min via INTRAVENOUS
  Administered 2014-10-11: 20 ug/kg/min via INTRAVENOUS
  Filled 2014-10-10 (×3): qty 100

## 2014-10-10 MED ORDER — INSULIN ASPART 100 UNIT/ML ~~LOC~~ SOLN
0.0000 [IU] | SUBCUTANEOUS | Status: DC
  Administered 2014-10-10 – 2014-10-11 (×6): 2 [IU] via SUBCUTANEOUS
  Administered 2014-10-11: 3 [IU] via SUBCUTANEOUS
  Administered 2014-10-12: 2 [IU] via SUBCUTANEOUS
  Administered 2014-10-12: 3 [IU] via SUBCUTANEOUS
  Administered 2014-10-12 (×3): 2 [IU] via SUBCUTANEOUS
  Administered 2014-10-12: 3 [IU] via SUBCUTANEOUS
  Administered 2014-10-13 (×3): 2 [IU] via SUBCUTANEOUS
  Administered 2014-10-13: 3 [IU] via SUBCUTANEOUS
  Administered 2014-10-13 – 2014-10-14 (×3): 2 [IU] via SUBCUTANEOUS
  Administered 2014-10-14: 3 [IU] via SUBCUTANEOUS
  Administered 2014-10-14 (×2): 2 [IU] via SUBCUTANEOUS
  Administered 2014-10-14 – 2014-10-15 (×2): 3 [IU] via SUBCUTANEOUS
  Administered 2014-10-15 – 2014-10-20 (×17): 2 [IU] via SUBCUTANEOUS
  Administered 2014-10-20: 3 [IU] via SUBCUTANEOUS

## 2014-10-10 MED ORDER — DEXTROSE 5 % IV SOLN
500.0000 mg | INTRAVENOUS | Status: DC
  Administered 2014-10-11 – 2014-10-12 (×2): 500 mg via INTRAVENOUS
  Filled 2014-10-10 (×2): qty 500

## 2014-10-10 MED ORDER — DEXTROSE 5 % IV SOLN: 500.0000 mg | INTRAVENOUS | Status: DC

## 2014-10-10 MED ORDER — ASPIRIN 300 MG RE SUPP: 300.0000 mg | RECTAL | Status: AC

## 2014-10-10 MED ORDER — VITAL AF 1.2 CAL PO LIQD
1000.0000 mL | ORAL | Status: DC
  Administered 2014-10-10 – 2014-10-20 (×10): 1000 mL
  Filled 2014-10-10 (×20): qty 1000

## 2014-10-10 MED ORDER — ACETAMINOPHEN 160 MG/5ML PO SOLN
650.0000 mg | Freq: Once | ORAL | Status: AC
  Administered 2014-10-10: 650 mg
  Filled 2014-10-10: qty 20.3

## 2014-10-10 MED ORDER — PRO-STAT SUGAR FREE PO LIQD
30.0000 mL | Freq: Two times a day (BID) | ORAL | Status: AC
  Administered 2014-10-10 (×2): 30 mL
  Filled 2014-10-10 (×2): qty 30

## 2014-10-10 MED ORDER — ASPIRIN 81 MG PO CHEW
324.0000 mg | CHEWABLE_TABLET | ORAL | Status: AC
  Administered 2014-10-10: 324 mg via ORAL
  Filled 2014-10-10: qty 4

## 2014-10-10 MED ORDER — IPRATROPIUM-ALBUTEROL 0.5-2.5 (3) MG/3ML IN SOLN
3.0000 mL | Freq: Four times a day (QID) | RESPIRATORY_TRACT | Status: DC
  Administered 2014-10-10 – 2014-10-16 (×24): 3 mL via RESPIRATORY_TRACT
  Filled 2014-10-10 (×24): qty 3

## 2014-10-10 MED ORDER — PANTOPRAZOLE SODIUM 40 MG PO PACK
40.0000 mg | PACK | Freq: Every day | ORAL | Status: DC
  Administered 2014-10-10 – 2014-10-19 (×10): 40 mg
  Filled 2014-10-10 (×12): qty 20

## 2014-10-10 MED ORDER — PANTOPRAZOLE SODIUM 40 MG IV SOLR: 40.0000 mg | INTRAVENOUS | Status: DC

## 2014-10-10 MED ORDER — BUDESONIDE 0.25 MG/2ML IN SUSP
0.2500 mg | Freq: Four times a day (QID) | RESPIRATORY_TRACT | Status: DC
  Administered 2014-10-10: 0.25 mg via RESPIRATORY_TRACT
  Filled 2014-10-10 (×4): qty 2

## 2014-10-10 MED ORDER — SODIUM CHLORIDE 0.9 % IV SOLN
250.0000 mL | INTRAVENOUS | Status: DC | PRN
  Administered 2014-10-10 – 2014-10-17 (×2): 250 mL via INTRAVENOUS

## 2014-10-10 MED ORDER — FENTANYL CITRATE (PF) 100 MCG/2ML IJ SOLN: 100.0000 ug | INTRAMUSCULAR | Status: DC | PRN

## 2014-10-10 MED ORDER — ROCURONIUM BROMIDE 50 MG/5ML IV SOLN
INTRAVENOUS | Status: DC | PRN
  Administered 2014-10-10: 100 mg via INTRAVENOUS

## 2014-10-10 MED ORDER — RIVAROXABAN 15 MG PO TABS
15.0000 mg | ORAL_TABLET | Freq: Every day | ORAL | Status: DC
  Administered 2014-10-10 – 2014-10-13 (×4): 15 mg
  Filled 2014-10-10 (×5): qty 1

## 2014-10-10 MED ORDER — ETOMIDATE 2 MG/ML IV SOLN
INTRAVENOUS | Status: DC | PRN
  Administered 2014-10-10: 20 mg via INTRAVENOUS

## 2014-10-10 MED ORDER — DEXTROSE 5 % IV SOLN
1.0000 g | Freq: Once | INTRAVENOUS | Status: AC
  Administered 2014-10-10: 1 g via INTRAVENOUS
  Filled 2014-10-10: qty 10

## 2014-10-10 MED ORDER — SODIUM CHLORIDE 0.9 % IV BOLUS (SEPSIS)
500.0000 mL | INTRAVENOUS | Status: DC | PRN
  Administered 2014-10-10: 500 mL via INTRAVENOUS
  Filled 2014-10-10: qty 500

## 2014-10-10 MED ORDER — BUDESONIDE 0.25 MG/2ML IN SUSP
0.2500 mg | Freq: Four times a day (QID) | RESPIRATORY_TRACT | Status: DC
  Administered 2014-10-10 – 2014-10-19 (×34): 0.25 mg via RESPIRATORY_TRACT
  Filled 2014-10-10 (×41): qty 2

## 2014-10-10 MED ORDER — INSULIN ASPART 100 UNIT/ML ~~LOC~~ SOLN
1.0000 [IU] | SUBCUTANEOUS | Status: DC
  Administered 2014-10-10: 1 [IU] via SUBCUTANEOUS

## 2014-10-10 MED ORDER — PROPOFOL 1000 MG/100ML IV EMUL
INTRAVENOUS | Status: AC
  Filled 2014-10-10: qty 100

## 2014-10-10 MED ORDER — IPRATROPIUM-ALBUTEROL 0.5-2.5 (3) MG/3ML IN SOLN
3.0000 mL | Freq: Once | RESPIRATORY_TRACT | Status: AC
  Administered 2014-10-10: 3 mL via RESPIRATORY_TRACT
  Filled 2014-10-10: qty 3

## 2014-10-10 MED ORDER — CEFTRIAXONE SODIUM 1 G IJ SOLR
1.0000 g | INTRAMUSCULAR | Status: DC
  Administered 2014-10-11 – 2014-10-16 (×6): 1 g via INTRAVENOUS
  Filled 2014-10-10 (×6): qty 10

## 2014-10-10 MED ORDER — SODIUM CHLORIDE 0.9 % IV BOLUS (SEPSIS)
500.0000 mL | Freq: Once | INTRAVENOUS | Status: AC
  Administered 2014-10-10: 500 mL via INTRAVENOUS

## 2014-10-10 MED ORDER — SODIUM CHLORIDE 0.9 % IV BOLUS (SEPSIS)
1000.0000 mL | Freq: Once | INTRAVENOUS | Status: AC
  Administered 2014-10-10: 1000 mL via INTRAVENOUS

## 2014-10-10 MED ORDER — ROCURONIUM BROMIDE 50 MG/5ML IV SOLN
INTRAVENOUS | Status: AC
Start: 2014-10-10 — End: 2014-10-10
  Filled 2014-10-10: qty 2

## 2014-10-10 MED ORDER — FENTANYL CITRATE (PF) 100 MCG/2ML IJ SOLN
25.0000 ug | INTRAMUSCULAR | Status: DC | PRN
  Administered 2014-10-11: 100 ug via INTRAVENOUS
  Filled 2014-10-10: qty 2

## 2014-10-10 MED ORDER — ETOMIDATE 2 MG/ML IV SOLN
INTRAVENOUS | Status: AC
  Filled 2014-10-10: qty 20

## 2014-10-10 MED ORDER — DEXTROSE 5 % IV SOLN: 1.0000 g | INTRAVENOUS | Status: DC

## 2014-10-10 MED ORDER — PROPOFOL 10 MG/ML IV BOLUS: INTRAVENOUS | Status: DC | PRN

## 2014-10-10 MED ORDER — CHLORHEXIDINE GLUCONATE 0.12 % MT SOLN
15.0000 mL | Freq: Two times a day (BID) | OROMUCOSAL | Status: DC
  Administered 2014-10-10 – 2014-10-20 (×21): 15 mL via OROMUCOSAL
  Filled 2014-10-10 (×24): qty 15

## 2014-10-10 MED ORDER — VITAL HIGH PROTEIN PO LIQD
1000.0000 mL | ORAL | Status: DC
  Filled 2014-10-10 (×2): qty 1000

## 2014-10-10 MED ORDER — HEPARIN SODIUM (PORCINE) 5000 UNIT/ML IJ SOLN: 5000.0000 [IU] | Freq: Three times a day (TID) | INTRAMUSCULAR | Status: DC

## 2014-10-10 MED ORDER — LIDOCAINE HCL (CARDIAC) 20 MG/ML IV SOLN
INTRAVENOUS | Status: AC
Start: 2014-10-10 — End: 2014-10-10
  Filled 2014-10-10: qty 5

## 2014-10-10 MED ORDER — CETYLPYRIDINIUM CHLORIDE 0.05 % MT LIQD
7.0000 mL | Freq: Four times a day (QID) | OROMUCOSAL | Status: DC
  Administered 2014-10-10 – 2014-10-20 (×38): 7 mL via OROMUCOSAL

## 2014-10-10 MED ORDER — DEXTROSE 5 % IV SOLN
500.0000 mg | Freq: Once | INTRAVENOUS | Status: AC
  Administered 2014-10-10: 500 mg via INTRAVENOUS
  Filled 2014-10-10: qty 500

## 2014-10-10 MED ORDER — ALBUTEROL SULFATE (2.5 MG/3ML) 0.083% IN NEBU: 5.0000 mg | INHALATION_SOLUTION | Freq: Once | RESPIRATORY_TRACT | Status: DC

## 2014-10-10 NOTE — ED Notes (Signed)
Report given to RN on 2M 

## 2014-10-10 NOTE — Progress Notes (Addendum)
ANTICOAGULATION CONSULT NOTE - Initial Consult  Pharmacy Consult for rivaroxaban Indication: atrial fibrillation  Allergies  Allergen Reactions  . Codeine Nausea And Vomiting  . Inspra [Eplerenone] Other (See Comments)    Stomach problems  . Spironolactone Other (See Comments)    Gynecomastia  . Atorvastatin Other (See Comments)    Arthritis/pain  . Crestor [Rosuvastatin] Other (See Comments)    Arthritis/pain ("couldn't hardly walk")  . Doxycycline Itching and Rash  . Simvastatin Other (See Comments)    Arthritis/pain  . Sulfonamide Derivatives Itching and Rash    Patient Measurements: Height: 5\' 11"  (180.3 cm) Weight: 187 lb (84.823 kg) IBW/kg (Calculated) : 75.3 Heparin Dosing Weight:   Vital Signs: Temp: 100.3 F (37.9 C) (07/07 0129) Temp Source: Rectal (07/07 0129) BP: 99/43 mmHg (07/07 1110) Pulse Rate: 97 (07/07 1110)  Labs:  Recent Labs  10/16/2014 0150 10/25/2014 0750  HGB 12.2*  --   HCT 37.1*  --   PLT 251  --   APTT  --  40*  LABPROT  --  24.9*  INR  --  2.28*  CREATININE 2.65* 2.33*  TROPONINI  --  0.05*    Estimated Creatinine Clearance: 26 mL/min (by C-G formula based on Cr of 2.33).   Medical History: Past Medical History  Diagnosis Date  . Chronic systolic heart failure     a. Chronic LV systolic failure. b. Echo in Dec 2011 showed EF of 25% - f/u echo 02/2011: Mild LVH, EF 15%, posterior lateral akinesis, inferior akinesis, grade 1 diastolic dysfunction, mild LAE. c. s/p BiV pacemaker 06/2011.  [name]. Atrial flutter     a. Off amiodarone because of increased LFTs  . COPD (chronic obstructive pulmonary disease)     Chronic SOB, O2 dependent  . Myocardial infarction 1994  . Gastro - esophageal reflux   . Claudication   . BPH (benign prostatic hyperplasia)   . Coronary artery disease     a. Myocardial infarction in 1984,  99 and 07. b. H/o multiple RCA stents. c.  LHC 04/2011: without obstructive disease. oLM 25%, mLAD 30%, pCFX 25%, prox to  mid RCA stent ok, dRCA 30% and 70%, pPDA 25%, mPL 25-30%, EF 25%.  [name]. Hypertension   . Atrial fibrillation     a. On Xarelto.  . Hemorrhoids   . Hyperlipidemia     statin intolerant 2/2 myalgias  . Hx of adenomatous colonic polyps   . Hx of colonoscopy   . Chronic renal insufficiency   . Biventricular cardiac pacemaker in situ 3.2013  . NSVT (nonsustained ventricular tachycardia)     a. 04/2012- 6 beats on tele.  [name]. Shortness of breath   . Arthritis     hips ,back ,hands  . Iron deficiency anemia, unspecified 03/20/2013    Medications:  See EMR  Assessment: 81 yo male with hx of AFib, previously on amiodarone but was previously discontinued due to LFT abnormalities. Pt has AKI but Scr is trending down, Scr 1.6 > 2.6 > 2.3. Remains on Xarelto.   Goal of Therapy:  Anti-Xa level 0.6-1 units/ml 4hrs after LMWH dose given Monitor platelets by anticoagulation protocol: Yes   Plan:  -Rivaroxaban 15 mg po daily -- reduced dose due to renal fx -Watch renal fx, adjust as necessary  -Monitor for s/sx bleeding    Agapito GamesAlison Vivianna Piccini, PharmD, BCPS Clinical Pharmacist Pager: [phone] 10/25/2014 12:21 PM

## 2014-10-10 NOTE — ED Provider Notes (Addendum)
CSN: [id number]     Arrival date & time 10/26/2014  0104 History  This chart was scribed for  Marisa Severin, MD by Bethel Born, ED Scribe. This patient was seen in room B14C/B14C and the patient's care was started at 1:33 AM.    Chief Complaint  Patient presents with  . Shortness of Breath     The history is provided by the patient and the EMS personnel. The history is limited by the condition of the patient. No language interpreter was used.   Andrew [name] is a 81 y.o. male with PMHx of systolic heart failure, atrial flutter, a-fib, COPD, MI, HTN, HLD, and NSVT who presents to the Emergency Department complaining of Respiratory distress. Per EMS, the pt  had O2 sat of 74% on RA when they arrived.  Associated symptoms include subjective fever and cough. Pt denies chest pain, abdominal pain, and bodily sores.   History is limited secondary to respiratory distress, confusion.   Past Medical History  Diagnosis Date  . Chronic systolic heart failure     a. Chronic LV systolic failure. b. Echo in Dec 2011 showed EF of 25% - f/u echo 02/2011: Mild LVH, EF 15%, posterior lateral akinesis, inferior akinesis, grade 1 diastolic dysfunction, mild LAE. c. s/p BiV pacemaker 06/2011.  [name] Atrial flutter     a. Off amiodarone because of increased LFTs  . COPD (chronic obstructive pulmonary disease)     Chronic SOB, O2 dependent  . Myocardial infarction 1994  . Gastro - esophageal reflux   . Claudication   . BPH (benign prostatic hyperplasia)   . Coronary artery disease     a. Myocardial infarction in 1984,  99 and 07. b. H/o multiple RCA stents. c.  LHC 04/2011: without obstructive disease. oLM 25%, mLAD 30%, pCFX 25%, prox to mid RCA stent ok, dRCA 30% and 70%, pPDA 25%, mPL 25-30%, EF 25%.  [name] Hypertension   . Atrial fibrillation     a. On Xarelto.  . Hemorrhoids   . Hyperlipidemia     statin intolerant 2/2 myalgias  . Hx of adenomatous colonic polyps   . Hx of colonoscopy   . Chronic renal  insufficiency   . Biventricular cardiac pacemaker in situ 3.2013  . NSVT (nonsustained ventricular tachycardia)     a. 04/2012- 6 beats on tele.  [name] Shortness of breath   . Arthritis     hips ,back ,hands  . Iron deficiency anemia, unspecified 03/20/2013   Past Surgical History  Procedure Laterality Date  . Cardiac catheterization  2008  . Cataract extraction    . Salivary gland surgery    . Coronary angioplasty with stent placement    . Insert / replace / remove pacemaker  06/16/2011  . Tee without cardioversion  05/08/2012    Procedure: TRANSESOPHAGEAL ECHOCARDIOGRAM (TEE);  Surgeon: Vesta Mixer, MD;  Location: [facility] ENDOSCOPY;  Service: Cardiovascular;  Laterality: N/A;  . Bi-ventricular pacemaker insertion N/A 06/16/2011    Procedure: BI-VENTRICULAR PACEMAKER INSERTION (CRT-P);  Surgeon: Duke Salvia, MD;  Location: [facility] CATH LAB;  Service: Cardiovascular;  Laterality: N/A;   Family History  Problem Relation Age of Onset  . Coronary artery disease Father   . Heart disease Mother   . Colon cancer Neg Hx   . Heart disease Brother    History  Substance Use Topics  . Smoking status: Former Smoker -- 1.50 packs/day for 55 years    Types: Cigarettes    Start date: 05/21/1954  Quit date: 01/03/2009  . Smokeless tobacco: Never Used     Comment: quit 4 years ago  . Alcohol Use: No     Comment: occasional    Review of Systems  Unable to perform ROS: Severe respiratory distress      Allergies  Codeine; Inspra; Spironolactone; Atorvastatin; Crestor; Doxycycline; Simvastatin; and Sulfonamide derivatives  Home Medications   Prior to Admission medications   Medication Sig Start Date End Date Taking? Authorizing Provider  albuterol (PROAIR HFA) 108 (90 BASE) MCG/ACT inhaler Inhale 1 to 2 puffs everys 6 hours as needed 04/18/14   Collene GobbleSteven A Daub, MD  albuterol (PROVENTIL) (5 MG/ML) 0.5% nebulizer solution Take 0.5 mLs (2.5 mg total) by nebulization every 6 (six) hours as needed for  wheezing or shortness of breath. 04/18/14   Morrell RiddleSarah L Weber, PA-C  ALPRAZolam Prudy Feeler(XANAX) 0.25 MG tablet Take 0.25 mg by mouth daily as needed. For anxiety. 06/23/10   Roger ShelterStanley Tennant, MD  azelastine (ASTELIN) 0.1 % nasal spray Place 1 spray into both nostrils 2 (two) times daily. Use in each nostril as directed 05/03/14   Collene GobbleSteven A Daub, MD  carvedilol (COREG) 25 MG tablet Take 1/2  tablet(12.5 mg) twice a day 08/09/14   Dolores Pattyaniel R Bensimhon, MD  esomeprazole (NEXIUM) 40 MG capsule TAKE 1 CAPSULE TWICE A DAY 06/03/14   Duke SalviaSteven C Klein, MD  Fluticasone-Salmeterol (ADVAIR) 250-50 MCG/DOSE AEPB Inhale 1 puff into the lungs 2 (two) times daily.    Historical Provider, MD  furosemide (LASIX) 40 MG tablet Take 40 mg by mouth in the pm.Take 1 extra tablet for weight greater than 180lb. 03/22/14   Rollene RotundaJames Hochrein, MD  furosemide (LASIX) 80 MG tablet Take 1 tab by mouth  Daily (80 mg)  in AM 03/22/14   Rollene RotundaJames Hochrein, MD  guaiFENesin (MUCINEX) 600 MG 12 hr tablet Take 1,200 mg by mouth 2 (two) times daily.     Historical Provider, MD  HYDROcodone-acetaminophen (NORCO/VICODIN) 5-325 MG per tablet Take 1 tablet by mouth every 6 (six) hours. Arthritis pain    Historical Provider, MD  ipratropium-albuterol (DUONEB) 0.5-2.5 (3) MG/3ML SOLN Take 3 mLs by nebulization 3 (three) times daily. 05/03/14   Collene GobbleSteven A Daub, MD  meclizine (ANTIVERT) 25 MG tablet Take 12.5-25 mg by mouth 3 (three) times daily as needed for dizziness.    Historical Provider, MD  nitroGLYCERIN (NITROSTAT) 0.4 MG SL tablet Place 1 tablet (0.4 mg total) under the tongue every 5 (five) minutes as needed for chest pain. 03/12/14   Emi Belfasteborah B Gessner, FNP  NON FORMULARY Place 2 L into the nose at bedtime as needed (Oxygen).     Historical Provider, MD  potassium chloride SA (K-DUR,KLOR-CON) 20 MEQ tablet Take 1 tablet (20 mEq total) by mouth daily. 04/15/14   Bevelyn Bucklesaniel R Bensimhon, MD  sacubitril-valsartan (ENTRESTO) 24-26 MG Take 1 tablet by mouth 2 (two) times  daily. Patient not taking: Reported on 09/09/2014 04/15/14   Laurey Moralealton S McLean, MD  Tamsulosin HCl (FLOMAX) 0.4 MG CAPS Take 0.4 mg by mouth 2 (two) times daily.     Historical Provider, MD  XARELTO 20 MG TABS tablet TAKE 1 TABLET DAILY WITH SUPPER 07/29/14   Rosalio MacadamiaLori C Gerhardt, NP   Triage Vitals: BP 110/50 mmHg  Pulse 99  Temp(Src) 100.3 F (37.9 C) (Rectal)  Resp 33  Ht 5\' 11"  (1.803 m)  Wt 187 lb (84.823 kg)  BMI 26.09 kg/m2  SpO2 100% Physical Exam  Constitutional: He appears well-developed and well-nourished. He  appears distressed.  HENT:  Head: Normocephalic and atraumatic.  Right Ear: External ear normal.  Left Ear: External ear normal.  Nose: Nose normal.  Mouth/Throat: Oropharynx is clear and moist.  Eyes: Conjunctivae and EOM are normal. Pupils are equal, round, and reactive to light.  Neck: Normal range of motion. Neck supple. No JVD present. No tracheal deviation present. No thyromegaly present.  Cardiovascular: Normal rate, regular rhythm, normal heart sounds and intact distal pulses.  Exam reveals no gallop and no friction rub.   No murmur heard. Pulmonary/Chest: Breath sounds normal. No stridor. He is in respiratory distress. He has no wheezes. He has no rales. He exhibits no tenderness.  Rhonchi, accessory muscle use, tachypnea  Abdominal: Soft. Bowel sounds are normal. He exhibits no distension and no mass. There is no tenderness. There is no rebound and no guarding.  Musculoskeletal: Normal range of motion. He exhibits no edema or tenderness.  Lymphadenopathy:    He has no cervical adenopathy.  Neurological: He is alert. He displays normal reflexes. He exhibits normal muscle tone. Coordination normal.  Confused, oriented to self and hospital, but unable to tell me year, day, or current course of illness  Skin: Skin is warm and dry. No rash noted. No erythema. No pallor.  Psychiatric: He has a normal mood and affect. His behavior is normal. Judgment and thought content  normal.  Nursing note and vitals reviewed.   ED Course  INTUBATION Date/Time: 10/05/2014 5:00 AM Performed by: Marisa Severin Authorized by: Marisa Severin Consent: The procedure was performed in an emergent situation. Indications: respiratory distress,  respiratory failure and  hypercapnia Intubation method: video-assisted Patient status: paralyzed (RSI) Preoxygenation: BVM and nonrebreather mask Pretreatment medications: none Sedatives: etomidate Paralytic: rocuronium Laryngoscope size: Miller 3 Tube size: 7.5 mm Tube type: cuffed Number of attempts: 1 Ventilation between attempts: BVM Cricoid pressure: no Cords visualized: yes Post-procedure assessment: chest rise and ETCO2 monitor Breath sounds: equal Cuff inflated: yes ETT to lip: 22 cm Tube secured with: ETT holder Chest x-ray interpreted by me and radiologist. Chest x-ray findings: endotracheal tube in appropriate position Patient tolerance: Patient tolerated the procedure well with no immediate complications    DIAGNOSTIC STUDIES: Oxygen Saturation is 100% on non-rebreather mask at 100%  COORDINATION OF CARE: 1:39 AM Discussed treatment plan which includes Bipap, CXR, EKG, and lab work with pt at bedside.  Labs Review Labs Reviewed  COMPREHENSIVE METABOLIC PANEL - Abnormal; Notable for the following:    Potassium 5.4 (*)    Glucose, Bld 151 (*)    BUN 62 (*)    Creatinine, Ser 2.65 (*)    Calcium 8.8 (*)    Albumin 3.1 (*)    ALT 13 (*)    GFR calc non Af Amer 21 (*)    GFR calc Af Amer 24 (*)    All other components within normal limits  CBC WITH DIFFERENTIAL/PLATELET - Abnormal; Notable for the following:    WBC 20.1 (*)    RBC 3.76 (*)    Hemoglobin 12.2 (*)    HCT 37.1 (*)    Neutrophils Relative % 93 (*)    Neutro Abs 18.7 (*)    Lymphocytes Relative 3 (*)    Lymphs Abs 0.6 (*)    All other components within normal limits  I-STAT ARTERIAL BLOOD GAS, ED - Abnormal; Notable for the following:    pH,  Arterial 7.219 (*)    pCO2 arterial 64.4 (*)    pO2, Arterial 200.0 (*)  Bicarbonate 26.3 (*)    Acid-base deficit 3.0 (*)    All other components within normal limits  CULTURE, BLOOD (ROUTINE X 2)  CULTURE, BLOOD (ROUTINE X 2)  URINE CULTURE  URINALYSIS, ROUTINE W REFLEX MICROSCOPIC (NOT AT [facility])  I-STAT TROPOININ, ED  I-STAT CG4 LACTIC ACID, ED    Imaging Review No results found.   EKG Interpretation   Date/Time:  Thursday 11/05/14 01:17:11 EDT Ventricular Rate:  98 PR Interval:  194 QRS Duration: 159 QT Interval:  379 QTC Calculation: 484 R Axis:   -90 Text Interpretation:  Atrial-sensed ventricular-paced complexes No further  analysis attempted due to paced rhythm Confirmed by Kem Hensen  MD, Lafawn Lenoir  ([id number]) on 11-05-2014 1:44:33 AM     CRITICAL CARE Performed by: Olivia Mackie Total critical care time: 60 min Critical care time was exclusive of separately billable procedures and treating other patients. Critical care was necessary to treat or prevent imminent or life-threatening deterioration. Critical care was time spent personally by me on the following activities: development of treatment plan with patient and/or surrogate as well as nursing, discussions with consultants, evaluation of patient's response to treatment, examination of patient, obtaining history from patient or surrogate, ordering and performing treatments and interventions, ordering and review of laboratory studies, ordering and review of radiographic studies, pulse oximetry and re-evaluation of patient's condition.  MDM   Final diagnoses:  CAP (community acquired pneumonia)  AKI (acute kidney injury)  Hyperkalemia  Sepsis, due to unspecified organism  Acute respiratory failure with hypoxia and hypercarbia    I personally performed the services described in this documentation, which was scribed in my presence. The recorded information has been reviewed and is accurate.  81 yo male coming from home  via EMS with SOB.  Pt found in respiratory distress.  Febrile here, cxr with possible pna, elevated wbc.  Moderate respiratory distress with tachypnea, accessory muscle use.  Lactate normal, however.  Pt with acute renal failure, confusion, tachypnea.  qSOFA of 2.  Pt started on Bipap, initial blood gas after 20 min shows hypercarbia.  Will increase rate and decrease FI02.  Given multiple affected organs, chf history, and concern for need for intubation if not improving with interventions, will d/w critical care for admission.     Marisa Severin, MD 11/05/2014 6045  5:17 AM Pt with worsening condition despite bipap.  Repeat abg essentially same.  Pt intubated without difficulty.  Contacted wife who reports onset of heavy breathing and feeling bad Monday, worsening through the week, anorexia.  They doubled his lasix dose, thinking that he may be having CHF exacerbation.  CCM at bedside, will admit.  Marisa Severin, MD 2014/11/05 [phone]

## 2014-10-10 NOTE — H&P (Signed)
PULMONARY / CRITICAL CARE MEDICINE   Name: Andrew [name] MRN: [id number] DOB: 1930/07/27    ADMISSION DATE:  10/14/2014 CONSULTATION DATE:  10/15/2014  REFERRING MD :  EDP Dr. Norlene Campbelltter  CHIEF COMPLAINT:  SOB  INITIAL PRESENTATION: 81 year old male [facility] ED [facility] with complaints of fever, SOB. He was hypoxemic requiring BiPAP, however ABG with minimal improvement so he was intubated in ED. CXR with bilateral airspace opacifications. PCCM to admit.   STUDIES:   SIGNIFICANT EVENTS:   HISTORY OF PRESENT ILLNESS:  81 year old male with complex past medical history which is significant for systolic heart failure, atrial flutter, CAD, COPD, MI, HTN, HLD, and NSVT. Patient lives at home with his wife who is currently ill, and unable to provide assistance with history. He is a poor historian at this time, but reports a one day history of feeling ill. EMS responded to a call for dyspnea. Upon their arrival he was noted to be hypoxic with O2 sat at 74%. He was placed on 100% NRB and brought to ED. In ED he was febrile with cough, hypoxemia, and profound dyspnea with increased WOB using accessory muscles. He was placed on BiPAP initially and did not respond as was hoped. ABG only marginally improved after about and hour and WOB was still increased. He was intubated in ED for airway protection. Of note CXR showed bilateral pulmonary infiltrates. WBC elevated at   PAST MEDICAL HISTORY :   has a past medical history of Chronic systolic heart failure; Atrial flutter; COPD (chronic obstructive pulmonary disease); Myocardial infarction (1994); Gastro - esophageal reflux; Claudication; BPH (benign prostatic hyperplasia); Coronary artery disease; Hypertension; Atrial fibrillation; Hemorrhoids; Hyperlipidemia; adenomatous colonic polyps; colonoscopy; Chronic renal insufficiency; Biventricular cardiac pacemaker in situ (3.2013); NSVT (nonsustained ventricular tachycardia); Shortness of breath; Arthritis; and Iron  deficiency anemia, unspecified (03/20/2013).  has past surgical history that includes Cardiac catheterization (2008); Cataract extraction; Salivary gland surgery; Coronary angioplasty with stent; Insert / replace / remove pacemaker (06/16/2011); TEE without cardioversion (05/08/2012); and bi-ventricular pacemaker insertion (N/A, 06/16/2011). Prior to Admission medications   Medication Sig Start Date End Date Taking? Authorizing Provider  albuterol (PROAIR HFA) 108 (90 BASE) MCG/ACT inhaler Inhale 1 to 2 puffs everys 6 hours as needed 04/18/14   Collene GobbleSteven A Daub, MD  albuterol (PROVENTIL) (5 MG/ML) 0.5% nebulizer solution Take 0.5 mLs (2.5 mg total) by nebulization every 6 (six) hours as needed for wheezing or shortness of breath. 04/18/14   Morrell RiddleSarah L Weber, PA-C  ALPRAZolam Prudy Feeler(XANAX) 0.25 MG tablet Take 0.25 mg by mouth daily as needed. For anxiety. 06/23/10   Roger ShelterStanley Tennant, MD  azelastine (ASTELIN) 0.1 % nasal spray Place 1 spray into both nostrils 2 (two) times daily. Use in each nostril as directed 05/03/14   Collene GobbleSteven A Daub, MD  carvedilol (COREG) 25 MG tablet Take 1/2  tablet(12.5 mg) twice a day 08/09/14   Dolores Pattyaniel R Bensimhon, MD  esomeprazole (NEXIUM) 40 MG capsule TAKE 1 CAPSULE TWICE A DAY 06/03/14   Duke SalviaSteven C Klein, MD  Fluticasone-Salmeterol (ADVAIR) 250-50 MCG/DOSE AEPB Inhale 1 puff into the lungs 2 (two) times daily.    Historical Provider, MD  furosemide (LASIX) 40 MG tablet Take 40 mg by mouth in the pm.Take 1 extra tablet for weight greater than 180lb. 03/22/14   Rollene RotundaJames Hochrein, MD  furosemide (LASIX) 80 MG tablet Take 1 tab by mouth  Daily (80 mg)  in AM 03/22/14   Rollene RotundaJames Hochrein, MD  guaiFENesin [facility](MUCINEX)  600 MG 12 hr tablet Take 1,200 mg by mouth 2 (two) times daily.     Historical Provider, MD  HYDROcodone-acetaminophen (NORCO/VICODIN) 5-325 MG per tablet Take 1 tablet by mouth every 6 (six) hours. Arthritis pain    Historical Provider, MD  ipratropium-albuterol (DUONEB) 0.5-2.5 (3) MG/3ML SOLN Take  3 mLs by nebulization 3 (three) times daily. 05/03/14   [name], MD  meclizine (ANTIVERT) 25 MG tablet Take 12.5-25 mg by mouth 3 (three) times daily as needed for dizziness.    Historical Provider, MD  nitroGLYCERIN (NITROSTAT) 0.4 MG SL tablet Place 1 tablet (0.4 mg total) under the tongue every 5 (five) minutes as needed for chest pain. 03/12/14   Emi Belfast, FNP  NON FORMULARY Place 2 L into the nose at bedtime as needed (Oxygen).     Historical Provider, MD  potassium chloride SA (K-DUR,KLOR-CON) 20 MEQ tablet Take 1 tablet (20 mEq total) by mouth daily. 04/15/14   Bevelyn Buckles Bensimhon, MD  sacubitril-valsartan (ENTRESTO) 24-26 MG Take 1 tablet by mouth 2 (two) times daily. Patient not taking: Reported on 09/09/2014 04/15/14   Laurey Morale, MD  Tamsulosin HCl (FLOMAX) 0.4 MG CAPS Take 0.4 mg by mouth 2 (two) times daily.     Historical Provider, MD  XARELTO 20 MG TABS tablet TAKE 1 TABLET DAILY WITH SUPPER 07/29/14   Rosalio Macadamia, NP   Allergies  Allergen Reactions  . Codeine Nausea And Vomiting  . Inspra [Eplerenone] Other (See Comments)    Stomach problems  . Spironolactone Other (See Comments)    Gynecomastia  . Atorvastatin Other (See Comments)    Arthritis/pain  . Crestor [Rosuvastatin] Other (See Comments)    Arthritis/pain ("couldn't hardly walk")  . Doxycycline Itching and Rash  . Simvastatin Other (See Comments)    Arthritis/pain  . Sulfonamide Derivatives Itching and Rash    FAMILY HISTORY:  indicated that his mother is deceased. He indicated that his father is deceased.  SOCIAL HISTORY:  reports that he quit smoking about 5 years ago. His smoking use included Cigarettes. He started smoking about 60 years ago. He has a 82.5 pack-year smoking history. He has never used smokeless tobacco. He reports that he does not drink alcohol or use illicit drugs.  REVIEW OF SYSTEMS:  Unable, intubated  SUBJECTIVE:   VITAL SIGNS: Temp:  [100.3 F (37.9 C)] 100.3 F  (37.9 C) (07/07 0129) Pulse Rate:  [40-99] 40 (07/07 0400) Resp:  [25-37] 31 (07/07 0400) BP: (110-137)/(50-62) 130/53 mmHg (07/07 0400) SpO2:  [99 %-100 %] 99 % (07/07 0400) Weight:  [84.823 kg (187 lb)] 84.823 kg (187 lb) (07/07 0120) HEMODYNAMICS:   VENTILATOR SETTINGS:   INTAKE / OUTPUT: No intake or output data in the 24 hours ending 10-25-2014 0530  PHYSICAL EXAMINATION: General:  Elderly male on vent Neuro:  Sedated, PERRL HEENT:  [place]/AT, no JVD noted Cardiovascular:  Paced rhythm, no MRG noted Lungs:  Fine crackles in bases, very faint expiratory wheeze Abdomen:  Soft, non-distended Musculoskeletal:  No acute deformity or ROM limitation Skin:  Grossly intact  LABS:  CBC  Recent Labs Lab 2014/10/25 0150  WBC 20.1*  HGB 12.2*  HCT 37.1*  PLT 251   Coag's No results for input(s): APTT, INR in the last 168 hours. BMET  Recent Labs Lab 10/25/14 0150  NA 136  K 5.4*  CL 101  CO2 24  BUN 62*  CREATININE 2.65*  GLUCOSE 151*   Electrolytes  Recent Labs  Lab 10-29-14 0150  CALCIUM 8.8*   Sepsis Markers  Recent Labs Lab 10/29/14 0156 10/29/2014 0437  LATICACIDVEN 0.68 0.94   ABG  Recent Labs Lab October 29, 2014 0257 10/29/14 0424  PHART 7.219* 7.227*  PCO2ART 64.4* 60.2*  PO2ART 200.0* 94.0   Liver Enzymes  Recent Labs Lab 2014-10-29 0150  AST 19  ALT 13*  ALKPHOS 79  BILITOT 0.6  ALBUMIN 3.1*   Cardiac Enzymes No results for input(s): TROPONINI, PROBNP in the last 168 hours. Glucose No results for input(s): GLUCAP in the last 168 hours.  Imaging Dg Chest Port 1 View  2014/10/29   CLINICAL DATA:  Acute onset of shortness of breath. Initial encounter.  EXAM: PORTABLE CHEST - 1 VIEW  COMPARISON:  Chest radiograph performed 05/13/2014  FINDINGS: The lungs are well-aerated. Vascular congestion is noted. Patchy bibasilar airspace opacities raise concern for pneumonia. An atypical infection cannot be excluded. Mild pulmonary edema cannot be excluded.  There is no evidence of pleural effusion or pneumothorax.  The cardiomediastinal silhouette is borderline normal in size. A pacemaker is noted overlying the left chest wall, with leads ending overlying the right atrium, right ventricle and coronary sinus. No acute osseous abnormalities are seen.  IMPRESSION: Vascular congestion noted. Patchy bibasilar airspace opacities raise concern for pneumonia; an atypical infection could have such an appearance. Mild pulmonary edema cannot be excluded.   Electronically Signed   By: Roanna Raider M.D.   On: Oct 29, 2014 02:18     ASSESSMENT / PLAN:  PULMONARY OETT 7/7 >>> A: Acute on chronic mixed respiratory failure CAP COPD +/- acute exacerbation  P:   Full vent support Follow ABG and CXR VAP bundle Scheduled BDs  CARDIOVASCULAR A:  Acute on chronic systolic CHF (LVEF 30% 10/2013) Pacemaker in-situ (currently A paced) H/o CAD (MI 1994), HTN, HLD  P:  Tele monitoring Echo  Hold home lasix, coreg, entresto   RENAL A:   Acute kidney injury Hyperkalemia  P:   Follow Bmet Correct K as indicated Gentle volume   GASTROINTESTINAL A:   GERD  P:   NPO for now SUP: IV protonix  HEMATOLOGIC A:   Xarelto coagulopathy  P:  Hold Xarelto Follow CBC Tranfuse per ICU guidelines  INFECTIOUS A:   CAP Severe Sepsis  P:   BCx2 7/7 >>> UC 7/7 >>> Sputum 7/7 >>> Abx: Ceftriaxone, start date 7/7 >>> Abx: Azitrhomycin, start date 7/7 >>> PCT algorithm   ENDOCRINE A:   No acute issues  P:   CBG monitoring and SSI  NEUROLOGIC A:   Acute metabolic encephalopathy  P:   RASS goal: -1 Propofol per PAD protocol PRN fenatnyl Monitor   FAMILY  - Updates:   - Inter-disciplinary family meet or Palliative Care meeting due by:  10/16/2014   Joneen Roach, AGACNP-BC [place] [facility]/Critical Care Pager [phone] or [phone]  October 29, 2014 6:39 AM

## 2014-10-10 NOTE — Sedation Documentation (Signed)
Pt intubated by Dr Norlene Campbelltter - 7.5 mm verified by capnography color change. Xray ordered to verify placement. RT at bedside to secure tube. NG tube placed by this RN ([id number]) - verified by auscultation and Xray.

## 2014-10-10 NOTE — Progress Notes (Signed)
Initial Nutrition Assessment  INTERVENTION:   Initiate TF via OGT with Vital AF 1.2 at 25 ml/h and Prostat 30 ml BID on day 1; on day 2, d/c Prostat and increase to goal rate of 65 ml/h (1560 ml per day) to provide 1872 kcals, 117 gm protein, 1265 ml free water daily.  Total intake with TF plus calories from Propofol will be 2004 kcals, 117 gm protein.[name].  NUTRITION DIAGNOSIS:  Inadequate oral intake related to inability to eat as evidenced by NPO status.  GOAL:  Patient will meet greater than or equal to 90% of their needs  MONITOR:  Vent status, TF tolerance, Labs, Weight trends  REASON FOR ASSESSMENT:  Consult Enteral/tube feeding initiation and management  ASSESSMENT:  Patient admitted on 7/7 with complaints of fever and SOB. Required intubation in the ED. Suspected CAP. Hx of COPD.  Labs reviewed, phosphorus slightly elevated.  Patient is currently intubated on ventilator support MV: 11.6 L/min Temp (24hrs), Avg:100.3 F (37.9 C), Min:100.3 F (37.9 C), Max:100.3 F (37.9 C)  Propofol: 5 ml/hr providing 132 kcals per day.  Height:  Ht Readings from Last 1 Encounters:  10/27/2014 5\' 11"  (1.803 m)    Weight:  Wt Readings from Last 1 Encounters:  10/23/2014 187 lb (84.823 kg)    Ideal Body Weight:  78.2 kg  Wt Readings from Last 10 Encounters:  10/28/2014 187 lb (84.823 kg)  09/09/14 187 lb 9.6 oz (85.095 kg)  06/14/14 183 lb 3.2 oz (83.099 kg)  05/13/14 182 lb (82.555 kg)  05/03/14 186 lb (84.369 kg)  04/10/14 180 lb (81.647 kg)  04/10/14 180 lb (81.647 kg)  03/27/14 184 lb 9.6 oz (83.734 kg)  03/15/14 184 lb (83.462 kg)  03/12/14 180 lb 12.8 oz (82.01 kg)    BMI:  Body mass index is 26.09 kg/(m^2).  Estimated Nutritional Needs:  Kcal:  1990  Protein:  120-130 gm  Fluid:  2 L  Skin:  Reviewed, no issues  Diet Order:   NPO  EDUCATION NEEDS:  No education needs identified at this time   Intake/Output Summary (Last 24 hours) at 10/09/2014  1308 Last data filed at 10/20/2014 1000  Gross per 24 hour  Intake 1368.33 ml  Output    765 ml  Net 603.33 ml    Last BM:  unknown   Joaquin CourtsKimberly Harris, RD, LDN, CNSC Pager [phone] After Hours Pager [phone]

## 2014-10-10 NOTE — Progress Notes (Signed)
*  PRELIMINARY RESULTS* Echocardiogram 2D Echocardiogram has been performed.  Andrew [name], Andrew [name] 10/31/2014, 10:34 AM

## 2014-10-10 NOTE — ED Notes (Signed)
Pt arrived via EMS from home c/o SOB and general ill feeling since Monday.  Pt lives with his wife, returned from [place] and wife has had a GI bug pt has been feeling ill.  Pt uses nebulizer at home, EMS arrived at house and pt was in respiratory distress with O2 sats 74%.  EMS gave 1 albuterol treatment.  Pt is on non-rebreather mask at 100%

## 2014-10-10 NOTE — Progress Notes (Signed)
 ns bolus given as per md order for systolic <90. Will continue to closely monitor, sedation also decreased.

## 2014-10-10 NOTE — Progress Notes (Signed)
Wakeup assessment not performed this morning, patient intubated earlier this morning. See nuero assessment for further detail.

## 2014-10-10 NOTE — ED Notes (Signed)
Order for strict I&Os - requested by Critical Care MD and 41M RN to place foley cath in ED prior to bringing to floor.

## 2014-10-10 NOTE — Progress Notes (Signed)
ANTIBIOTIC CONSULT   81yo male started on vanc for possible PNA, admitted to CCM for vent management, to broaden ABX coverage.  Will start Rocephin 1g IV Q24H and azithromycin 500mg  IV Q24H and monitor CBC, Cx, levels prn.  Vernard GamblesVeronda Jaquayla Hege, PharmD, BCPS 10/08/2014 5:48 AM

## 2014-10-10 NOTE — Procedures (Signed)
Patient transported from ED to 2M16 by patient's RN and RT without complications. RT at bedside.

## 2014-10-10 NOTE — Progress Notes (Signed)
RT intiated wheeze protocol as ordered but patient was unable to perform the peak flow meter. Rt will continue to monitor.

## 2014-10-10 NOTE — ED Notes (Signed)
Dr Norlene Campbelltter notified of positive sepsis screen

## 2014-10-10 NOTE — Care Management Note (Signed)
Case Management Note  Patient Details  Name: Andrew [name] MRN: [id number] Date of Birth: 1930/05/08  Subjective/Objective:     Patient intubated, wife at bedside.  Prior to admission lived at home with wife and was independent.                Action/Plan:   Expected Discharge Date:                  Expected Discharge Plan:  Home w Home Health Services  In-House Referral:     Discharge planning Services     Post Acute Care Choice:    Choice offered to:     DME Arranged:    DME Agency:     HH Arranged:    HH Agency:  [facility]  Status of Service:     Medicare Important Message Given:    Date Medicare IM Given:    Medicare IM give by:    Date Additional Medicare IM Given:    Additional Medicare Important Message give by:     If discussed at Long Length of Stay Meetings, dates discussed:    Additional Comments:  Vangie BickerBrown, Erhardt Dada Jane, RN 10/05/2014, 2:36 PM

## 2014-10-10 NOTE — Progress Notes (Signed)
RT obtained and sent sputum sample to lab.  

## 2014-10-10 NOTE — Progress Notes (Signed)
Call Placed to E-md concerning fever. Awaiting orders.

## 2014-10-10 NOTE — Progress Notes (Signed)
ANTIBIOTIC CONSULT NOTE - INITIAL  Pharmacy Consult for Vancomycin  Indication: rule out pneumonia  Allergies  Allergen Reactions  . Codeine Nausea And Vomiting  . Inspra [Eplerenone] Other (See Comments)    Stomach problems  . Spironolactone Other (See Comments)    Gynecomastia  . Atorvastatin Other (See Comments)    Arthritis/pain  . Crestor [Rosuvastatin] Other (See Comments)    Arthritis/pain ("couldn't hardly walk")  . Doxycycline Itching and Rash  . Simvastatin Other (See Comments)    Arthritis/pain  . Sulfonamide Derivatives Itching and Rash    Patient Measurements: Height: 5\' 11"  (180.3 cm) Weight: 187 lb (84.823 kg) IBW/kg (Calculated) : 75.3  Vital Signs: Temp: 100.3 F (37.9 C) (07/07 0129) Temp Source: Rectal (07/07 0129) BP: 130/53 mmHg (07/07 0400) Pulse Rate: 40 (07/07 0400)  Labs:  Recent Labs  10/22/2014 0150  WBC 20.1*  HGB 12.2*  PLT 251  CREATININE 2.65*   Estimated Creatinine Clearance: 22.9 mL/min (by C-G formula based on Cr of 2.65).  Medical History: Past Medical History  Diagnosis Date  . Chronic systolic heart failure     a. Chronic LV systolic failure. b. Echo in Dec 2011 showed EF of 25% - f/u echo 02/2011: Mild LVH, EF 15%, posterior lateral akinesis, inferior akinesis, grade 1 diastolic dysfunction, mild LAE. c. s/p BiV pacemaker 06/2011.  [name]. Atrial flutter     a. Off amiodarone because of increased LFTs  . COPD (chronic obstructive pulmonary disease)     Chronic SOB, O2 dependent  . Myocardial infarction 1994  . Gastro - esophageal reflux   . Claudication   . BPH (benign prostatic hyperplasia)   . Coronary artery disease     a. Myocardial infarction in 1984,  99 and 07. b. H/o multiple RCA stents. c.  LHC 04/2011: without obstructive disease. oLM 25%, mLAD 30%, pCFX 25%, prox to mid RCA stent ok, dRCA 30% and 70%, pPDA 25%, mPL 25-30%, EF 25%.  [name]. Hypertension   . Atrial fibrillation     a. On Xarelto.  . Hemorrhoids   .  Hyperlipidemia     statin intolerant 2/2 myalgias  . Hx of adenomatous colonic polyps   . Hx of colonoscopy   . Chronic renal insufficiency   . Biventricular cardiac pacemaker in situ 3.2013  . NSVT (nonsustained ventricular tachycardia)     a. 04/2012- 6 beats on tele.  [name]. Shortness of breath   . Arthritis     hips ,back ,hands  . Iron deficiency anemia, unspecified 03/20/2013    Assessment: Vancomycin for r/o PNA, WBC elevated, noted renal dysfunction, other labs as above.   Goal of Therapy:  Vancomycin trough level 15-20 mcg/ml  Plan:  -Vancomycin 1000 mg IV x 1 given in ED, then 750 mg IV q24h -F/U gram negative coverage (CTZ/Azithro given x 1) -Trend WBC, temp, renal function  -Drug levels as indicated   Abran DukeLedford, Torey Reinard 10/11/2014,5:29 AM

## 2014-10-11 ENCOUNTER — Inpatient Hospital Stay (HOSPITAL_COMMUNITY): Payer: Medicare Other

## 2014-10-11 DIAGNOSIS — J189 Pneumonia, unspecified organism: Secondary | ICD-10-CM

## 2014-10-11 DIAGNOSIS — J411 Mucopurulent chronic bronchitis: Secondary | ICD-10-CM

## 2014-10-11 DIAGNOSIS — I429 Cardiomyopathy, unspecified: Secondary | ICD-10-CM

## 2014-10-11 LAB — CBC
HCT: 30.8 % — ABNORMAL LOW (ref 39.0–52.0)
HEMOGLOBIN: 10 g/dL — AB (ref 13.0–17.0)
MCH: 32.6 pg (ref 26.0–34.0)
MCHC: 32.5 g/dL (ref 30.0–36.0)
MCV: 100.3 fL — AB (ref 78.0–100.0)
Platelets: 199 10*3/uL (ref 150–400)
RBC: 3.07 MIL/uL — AB (ref 4.22–5.81)
RDW: 14.3 % (ref 11.5–15.5)
WBC: 17.1 10*3/uL — ABNORMAL HIGH (ref 4.0–10.5)

## 2014-10-11 LAB — PROCALCITONIN: PROCALCITONIN: 3.08 ng/mL

## 2014-10-11 LAB — LEGIONELLA ANTIGEN, URINE

## 2014-10-11 LAB — BASIC METABOLIC PANEL
Anion gap: 8 (ref 5–15)
BUN: 73 mg/dL — ABNORMAL HIGH (ref 6–20)
CO2: 23 mmol/L (ref 22–32)
CREATININE: 2.59 mg/dL — AB (ref 0.61–1.24)
Calcium: 8.2 mg/dL — ABNORMAL LOW (ref 8.9–10.3)
Chloride: 107 mmol/L (ref 101–111)
GFR, EST AFRICAN AMERICAN: 25 mL/min — AB (ref >60)
GFR, EST NON AFRICAN AMERICAN: 21 mL/min — AB (ref >60)
Glucose, Bld: 136 mg/dL — ABNORMAL HIGH (ref 65–99)
POTASSIUM: 4.4 mmol/L (ref 3.5–5.1)
SODIUM: 138 mmol/L (ref 135–145)

## 2014-10-11 LAB — GLUCOSE, CAPILLARY
GLUCOSE-CAPILLARY: 119 mg/dL — AB (ref 65–99)
GLUCOSE-CAPILLARY: 140 mg/dL — AB (ref 65–99)
GLUCOSE-CAPILLARY: 151 mg/dL — AB (ref 65–99)
Glucose-Capillary: 129 mg/dL — ABNORMAL HIGH (ref 65–99)
Glucose-Capillary: 132 mg/dL — ABNORMAL HIGH (ref 65–99)
Glucose-Capillary: 138 mg/dL — ABNORMAL HIGH (ref 65–99)
Glucose-Capillary: 139 mg/dL — ABNORMAL HIGH (ref 65–99)

## 2014-10-11 LAB — URINE CULTURE: Culture: NO GROWTH

## 2014-10-11 MED ORDER — FENTANYL CITRATE (PF) 100 MCG/2ML IJ SOLN: 50.0000 ug | INTRAMUSCULAR | Status: DC | PRN

## 2014-10-11 MED ORDER — FENTANYL CITRATE (PF) 100 MCG/2ML IJ SOLN
50.0000 ug | INTRAMUSCULAR | Status: DC | PRN
  Administered 2014-10-11: 50 ug via INTRAVENOUS
  Filled 2014-10-11: qty 2

## 2014-10-11 MED ORDER — DEXMEDETOMIDINE HCL IN NACL 200 MCG/50ML IV SOLN
0.0000 ug/kg/h | INTRAVENOUS | Status: DC
  Administered 2014-10-11 (×3): 0.4 ug/kg/h via INTRAVENOUS
  Administered 2014-10-12 (×3): 0.8 ug/kg/h via INTRAVENOUS
  Filled 2014-10-11 (×7): qty 50

## 2014-10-11 NOTE — Progress Notes (Signed)
PULMONARY / CRITICAL CARE MEDICINE   Name: Andrew [name] MRN: [id number] DOB: 03-23-1931    ADMISSION DATE:  10/24/2014 CONSULTATION DATE:  10/24/2014  REFERRING MD :  EDP Dr. Norlene Campbell  CHIEF COMPLAINT:  SOB  INITIAL PRESENTATION: 81 year old male presented to [facility] ED 7/7 with complaints of fever, SOB. He was hypoxemic requiring BiPAP, however ABG with minimal improvement so he was intubated in ED. CXR with bilateral airspace opacifications. PCCM to admit.   STUDIES:  7/8 CXR: LUL density in periphery; cardiomegaly  SIGNIFICANT EVENTS: 7/7: Admitted; Intubated  SUBJECTIVE:  Patient more alert this AM than yesterday. No acute events overnight. Afebrile since 4:45pm 7/7.  VITAL SIGNS: Temp:  [97.8 F (36.6 C)-101.9 F (38.8 C)] 100.1 F (37.8 C) (07/08 1125) Pulse Rate:  [76-112] 102 (07/08 1300) Resp:  [15-41] 25 (07/08 1300) BP: (70-141)/(34-111) 121/53 mmHg (07/08 1300) SpO2:  [93 %-100 %] 95 % (07/08 1300) FiO2 (%):  [40 %] 40 % (07/08 1200) Weight:  [186 lb 1.1 oz (84.4 kg)] 186 lb 1.1 oz (84.4 kg) (07/08 0345) HEMODYNAMICS:   VENTILATOR SETTINGS: Vent Mode:  [-] PSV;CPAP FiO2 (%):  [40 %] 40 % Set Rate:  [14 bmp] 14 bmp Vt Set:  [600 mL] 600 mL PEEP:  [5 cmH20] 5 cmH20 Pressure Support:  [20 cmH20] 20 cmH20 Plateau Pressure:  [22 cmH20-28 cmH20] 25 cmH20 INTAKE / OUTPUT:  Intake/Output Summary (Last 24 hours) at 10/11/14 1320 Last data filed at 10/11/14 1300  Gross per 24 hour  Intake 1994.72 ml  Output   1080 ml  Net 914.72 ml    PHYSICAL EXAMINATION: General:  Elderly male; sedated, on vent Neuro:  Sedated, PERRL, responds to name, does not follow directions HEENT:  [place]/AT, no JVD, ETT in place. Some secretions appreciated Cardiovascular:  Paced rhythm, no MRG noted, peripheral pulses intact  Lungs:  Fine crackles in bases, good air movement Abdomen:  Soft, non-distended, does not appear TTP Musculoskeletal:  No acute deformity or ROM limitation Skin:   Grossly intact  LABS:  CBC  Recent Labs Lab 10/17/2014 0150 10/11/14 0220  WBC 20.1* 17.1*  HGB 12.2* 10.0*  HCT 37.1* 30.8*  PLT 251 199   Coag's  Recent Labs Lab 10/20/2014 0750  APTT 40*  INR 2.28*   BMET  Recent Labs Lab 10/15/2014 0150 10/30/2014 0750 10/11/14 0220  NA 136 138 138  K 5.4* 4.6 4.4  CL 101 105 107  CO2 BUN 62* 60* 73*  CREATININE 2.65* 2.33* 2.59*  GLUCOSE 151* 146* 136*   Electrolytes  Recent Labs Lab 10/05/2014 0150 10/20/2014 0750 10/11/14 0220  CALCIUM 8.8* 8.3* 8.2*  MG  --  2.2  --   PHOS  --  4.9*  --    Sepsis Markers  Recent Labs Lab 10/11/2014 0156 10/28/2014 0437 10/07/2014 0750 10/11/14 0220  LATICACIDVEN 0.68 0.94 1.1  --   PROCALCITON  --   --  1.98 3.08   ABG  Recent Labs Lab 10/30/2014 0424 10/30/2014 0557 10/29/2014 0745  PHART 7.227* 7.135* 7.230*  PCO2ART 60.2* 71.6* 55.7*  PO2ART 94.0 81.0 74.9*   Liver Enzymes  Recent Labs Lab 10/15/2014 0150 11/02/2014 0750  AST 19 17  ALT 13* 14*  ALKPHOS 79 71  BILITOT 0.6 0.6  ALBUMIN 3.1* 2.7*   Cardiac Enzymes  Recent Labs Lab 10/27/2014 0750 10/11/2014 1415 10/27/2014 1925  TROPONINI 0.05* 0.12* 0.04*   Glucose  Recent Labs Lab 10/16/2014 1643 10/24/2014  2032 10/13/2014 2328 10/11/14 0305 10/11/14 0819 10/11/14 1123  GLUCAP 116* 130* 119* 129* 138* 139*    Imaging Dg Chest Port 1 View  10/11/2014   CLINICAL DATA:  Respiratory failure.  EXAM: PORTABLE CHEST - 1 VIEW  COMPARISON:  10/06/2014.  11/10/2013.  03/16/2013.  FINDINGS: Endotracheal tube and NG tube in stable position. AICD in stable position. Stable cardiomegaly. Pulmonary vascularity normal. A density is noted in the periphery of the left upper lobe. This could represent a focal infiltrate, a mass lesion, or an infarct. Follow-up chest x-rays suggested demonstrate clearing. Interim clearing of pulmonary interstitial edema. No pleural effusion or pneumothorax.  IMPRESSION: 1. Lines and tubes in stable  position. 2. Peripheral density in the left upper lung. This could represent a focal infiltrate/ pneumonia, a mass lesion, or a pulmonary infarct. 3. AICD in good anatomic position. Cardiomegaly with normal pulmonary vascularity. Interim clearing of pulmonary interstitial edema.   Electronically Signed   By: Maisie Fushomas  Register   On: 10/11/2014 07:29     ASSESSMENT / PLAN:  PULMONARY OETT 7/7 >>> A: Acute on chronic mixed respiratory failure CAP: infiltrate on CXR; procalcitonin 3.08 COPD +/- acute exacerbation  P:   Full vent support: PEEP 5; FiO2 40% VAP bundle Abx as below Pulmicort/Duoneb  CARDIOVASCULAR A:  Hx CHF - unlikely cause for current status Pacemaker in-situ (currently A paced) H/o CAD (MI 1994), AFib, HTN, HLD P:  Tele monitoring Hold home lasix, coreg, entresto   RENAL A:   Acute kidney injury Hyperkalemia P:   Follow Bmet Correct K as indicated Gentle volume   GASTROINTESTINAL A:   GERD P:   NPO for now SUP: IV protonix  HEMATOLOGIC A:   Clot risk (AFib) P:  Xarelto per pharm Tranfuse per ICU guidelines  INFECTIOUS A:   CAP - PCT 3.08 Severe Sepsis P:   BCx2 7/7 >>>Gram variable rod (one tube only); speciation pending UC 7/7 >>>NGTD Sputum 7/7 >>>NGTD Abx: Ceftriaxone 7/7 >>> Abx: Azitrhomycin 7/7 >>> DC Vanc  ENDOCRINE A:   No acute issues P:   CBG monitoring and SSI  NEUROLOGIC A:   Acute metabolic encephalopathy P:   RASS goal: -1 Fentanyl/Precedex Monitor  FAMILY  - Updates:   - Inter-disciplinary family meet or Palliative Care meeting due by:  10/16/2014   Kathee DeltonIan D McKeag, MD,MS,  PGY2 10/11/2014 1:20 PM   PCCM ATTENDING: I have reviewed pt's initial presentation, consultants notes and hospital database in detail.  The above assessment and plan was formulated under my direction.  In summary: Remains vent dependent Able to tolerate PSV mode  CXR looks improved - there might be an area of infiltrate on L Purulent  secretions are less copious I continue to believe that this is more COPD and possible PNA more than CHF I have reviewed and adjusted vent DC vancomycin Cont ceftrx and azithro Cont other supportive measures and prophylaxis We have adjusted sedation regimen in anticipation of possible extubation in next day or two   35 minutes of independent CCM time was provided by me   Billy Fischeravid Demetrus Pavao, MD;  PCCM service; Mobile [phone]

## 2014-10-12 ENCOUNTER — Inpatient Hospital Stay (HOSPITAL_COMMUNITY): Payer: Medicare Other

## 2014-10-12 LAB — CBC
HCT: 31 % — ABNORMAL LOW (ref 39.0–52.0)
Hemoglobin: 10.1 g/dL — ABNORMAL LOW (ref 13.0–17.0)
MCH: 32.4 pg (ref 26.0–34.0)
MCHC: 32.6 g/dL (ref 30.0–36.0)
MCV: 99.4 fL (ref 78.0–100.0)
Platelets: 226 10*3/uL (ref 150–400)
RBC: 3.12 MIL/uL — ABNORMAL LOW (ref 4.22–5.81)
RDW: 14.4 % (ref 11.5–15.5)
WBC: 11.9 10*3/uL — ABNORMAL HIGH (ref 4.0–10.5)

## 2014-10-12 LAB — CULTURE, RESPIRATORY

## 2014-10-12 LAB — BASIC METABOLIC PANEL
Anion gap: 8 (ref 5–15)
BUN: 78 mg/dL — AB (ref 6–20)
CHLORIDE: 109 mmol/L (ref 101–111)
CO2: 24 mmol/L (ref 22–32)
Calcium: 8.3 mg/dL — ABNORMAL LOW (ref 8.9–10.3)
Creatinine, Ser: 2.19 mg/dL — ABNORMAL HIGH (ref 0.61–1.24)
GFR calc Af Amer: 31 mL/min — ABNORMAL LOW (ref >60)
GFR calc non Af Amer: 26 mL/min — ABNORMAL LOW (ref >60)
GLUCOSE: 130 mg/dL — AB (ref 65–99)
POTASSIUM: 4 mmol/L (ref 3.5–5.1)
Sodium: 141 mmol/L (ref 135–145)

## 2014-10-12 LAB — CULTURE, RESPIRATORY W GRAM STAIN

## 2014-10-12 LAB — GLUCOSE, CAPILLARY
GLUCOSE-CAPILLARY: 134 mg/dL — AB (ref 65–99)
Glucose-Capillary: 158 mg/dL — ABNORMAL HIGH (ref 65–99)
Glucose-Capillary: 131 mg/dL — ABNORMAL HIGH (ref 65–99)
Glucose-Capillary: 178 mg/dL — ABNORMAL HIGH (ref 65–99)
Glucose-Capillary: 155 mg/dL — ABNORMAL HIGH (ref 65–99)

## 2014-10-12 LAB — PROCALCITONIN: Procalcitonin: 2.14 ng/mL

## 2014-10-12 MED ORDER — FENTANYL CITRATE (PF) 100 MCG/2ML IJ SOLN
25.0000 ug | INTRAMUSCULAR | Status: DC | PRN
  Administered 2014-10-12 – 2014-10-15 (×7): 50 ug via INTRAVENOUS
  Filled 2014-10-12 (×7): qty 2

## 2014-10-12 MED ORDER — DEXMEDETOMIDINE HCL IN NACL 400 MCG/100ML IV SOLN
0.1000 ug/kg/h | INTRAVENOUS | Status: DC
Start: 2014-10-12 — End: 2014-10-16
  Administered 2014-10-12 (×2): 0.6 ug/kg/h via INTRAVENOUS
  Administered 2014-10-12: 0.986 ug/kg/h via INTRAVENOUS
  Administered 2014-10-12: 0.6 ug/kg/h via INTRAVENOUS
  Administered 2014-10-13: 0.9 ug/kg/h via INTRAVENOUS
  Administered 2014-10-13: 0.6 ug/kg/h via INTRAVENOUS
  Administered 2014-10-13: 1 ug/kg/h via INTRAVENOUS
  Administered 2014-10-13: 0.6 ug/kg/h via INTRAVENOUS
  Administered 2014-10-14: 1.2 ug/kg/h via INTRAVENOUS
  Administered 2014-10-14: 1.1 ug/kg/h via INTRAVENOUS
  Administered 2014-10-14 (×4): 1.2 ug/kg/h via INTRAVENOUS
  Administered 2014-10-15: 1 ug/kg/h via INTRAVENOUS
  Administered 2014-10-15 (×3): 1.2 ug/kg/h via INTRAVENOUS
  Administered 2014-10-15: 1 ug/kg/h via INTRAVENOUS
  Administered 2014-10-16 (×2): 1.2 ug/kg/h via INTRAVENOUS
  Filled 2014-10-12 (×21): qty 100

## 2014-10-12 MED ORDER — ACETAMINOPHEN 325 MG PO TABS
650.0000 mg | ORAL_TABLET | Freq: Once | ORAL | Status: AC
  Administered 2014-10-13: 650 mg
  Filled 2014-10-12: qty 2

## 2014-10-12 NOTE — Progress Notes (Signed)
PULMONARY / CRITICAL CARE MEDICINE   Name: Andrew [name] MRN: [id number] DOB: 1930/09/06    ADMISSION DATE:  October 15, 2014 CONSULTATION DATE:  October 15, 2014  REFERRING MD :  EDP Dr. Norlene Campbell  CHIEF COMPLAINT:  SOB  INITIAL PRESENTATION: 81 year old male presented [facility] ED 7/07 with fever, SOB, hypoxemia, pulmonary infiltrates, respiratory acidosis. Failed BiPAP and intubated. PCCM admitted to ICU. PMH COPD, CHF, AF, CAD, prior MI  MAJOR EVENTS/TEST RESULTS: 7/07 TTE: Inferior wall hypokinesis. Abnormal septal motion The cavity size was normal. Wall thickness was increased in a pattern of moderate LVH. Systolic function was mildly reduced. The estimated ejection fraction was in the range of 45% to 50%. The left atrium was mildly dilated  INDWELLING DEVICES:: ETT 7/07 >>    MICRO DATA: Urine 7/07 >> NEG Resp 7/07 >> NOF Blood 7/07 >> 1/2 gram variable rod >>    ANTIMICROBIALS:  Vancomycin 7/07 >> 7/08 Azithro 7/07 >> 7/09 Ceftriaxone 7/07 >>     SUBJECTIVE:  Little change overall. RASS -3. No WUA performed this AM.   VITAL SIGNS: Temp:  [98.4 F (36.9 C)-100.3 F (37.9 C)] 100.3 F (37.9 C) (07/09 1215) Pulse Rate:  [29-107] 75 (07/09 1400) Resp:  [19-36] 22 (07/09 1400) BP: (104-141)/(37-83) 120/49 mmHg (07/09 1400) SpO2:  [93 %-100 %] 100 % (07/09 1400) FiO2 (%):  [40 %] 40 % (07/09 1356) Weight:  [86 kg (189 lb 9.5 oz)] 86 kg (189 lb 9.5 oz) (07/09 0242) HEMODYNAMICS:   VENTILATOR SETTINGS: Vent Mode:  [-] PRVC FiO2 (%):  [40 %] 40 % Set Rate:  [14 bmp] 14 bmp Vt Set:  [600 mL] 600 mL PEEP:  [5 cmH20] 5 cmH20 Pressure Support:  [16 cmH20] 16 cmH20 Plateau Pressure:  [24 cmH20-29 cmH20] 24 cmH20 INTAKE / OUTPUT:  Intake/Output Summary (Last 24 hours) at 10/12/14 1458 Last data filed at 10/12/14 1400  Gross per 24 hour  Intake 2658.69 ml  Output   1425 ml  Net 1233.69 ml    PHYSICAL EXAMINATION: General: RASS -3, not F/C Neuro: no focal deficits HEENT:   [place]/AT, WNL Cardiovascular:  Reg, paced rhythm, no M Lungs: prolonged expiratory phase without wheezes, scattered rhonchi Abdomen:  Soft, NT, +BS Ext: warm, no edema  LABS:  CBC  Recent Labs Lab October 15, 2014 0150 10/11/14 0220 10/12/14 0200  WBC 20.1* 17.1* 11.9*  HGB 12.2* 10.0* 10.1*  HCT 37.1* 30.8* 31.0*  PLT 251 199 226   Coag's  Recent Labs Lab 2014/10/15 0750  APTT 40*  INR 2.28*   BMET  Recent Labs Lab 10/15/14 0750 10/11/14 0220 10/12/14 0200  NA 138 138 141  K 4.6 4.4 4.0  CL 105 107 109  CO2 BUN 60* 73* 78*  CREATININE 2.33* 2.59* 2.19*  GLUCOSE 146* 136* 130*   Electrolytes  Recent Labs Lab Oct 15, 2014 0750 10/11/14 0220 10/12/14 0200  CALCIUM 8.3* 8.2* 8.3*  MG 2.2  --   --   PHOS 4.9*  --   --    Sepsis Markers  Recent Labs Lab Oct 15, 2014 0156 Oct 15, 2014 0437 10-15-14 0750 10/11/14 0220 10/12/14 0200  LATICACIDVEN 0.68 0.94 1.1  --   --   PROCALCITON  --   --  1.98 3.08 2.14   ABG  Recent Labs Lab 10-15-2014 0424 10-15-14 0557 10-15-2014 0745  PHART 7.227* 7.135* 7.230*  PCO2ART 60.2* 71.6* 55.7*  PO2ART 94.0 81.0 74.9*   Liver Enzymes  Recent Labs Lab 10-15-14 0150 10/15/14 0750  AST 19 17  ALT 13* 14*  ALKPHOS 79 71  BILITOT 0.6 0.6  ALBUMIN 3.1* 2.7*   Cardiac Enzymes  Recent Labs Lab 10/29/2014 0750 10/29/2014 1415 10/17/2014 1925  TROPONINI 0.05* 0.12* 0.04*   Glucose  Recent Labs Lab 10/11/14 1605 10/11/14 1950 10/11/14 2331 10/12/14 0312 10/12/14 0824 10/12/14 1217  GLUCAP 140* 151* 132* 158* 131* 178*    CXR: RLL atx/inf    ASSESSMENT / PLAN:  PULMONARY A: Acute on chronic respiratory failure Mild pulm edema, resolved COPD - appears to be severe @ baseline Possible PNA P:   Cont vent support - settings reviewed and/or adjusted Cont vent bundle Daily SBT if/when meets criteria Cont nebulized steroids Cont nebulized BDs  CARDIOVASCULAR A:  H/o CAD (MI 1994), AFib, HTN,  HLD Pacemaker in-situ (currently A paced) Mild ischemic CM - LVEF 45-50% P:  Cont monitoring Holding home meds   RENAL A:   AKI, nonoliguric - improving Hyperkalemia, resolved P:   Monitor BMET intermittently Monitor I/Os Correct electrolytes as indicated  GASTROINTESTINAL A:   GERD P:   SUP: enteral pantoprazole Cont TFs  HEMATOLOGIC A:   Chronic anticoagulation (AF) P:  Cont rivaroxaban per pharm Tranfuse per ICU guidelines  INFECTIOUS A:   CAP, NOS Severe Sepsis P:   Micro and abx as above  ENDOCRINE A:   Hyperglycemia without prior DM dx P:   Cont CBG monitoring and SSI  NEUROLOGIC A:   Acute metabolic encephalopathy P:   RASS goal: -1 Fentanyl/Precedex Daily WUA  FAMILY  - Updates: None available  - Inter-disciplinary family meet or Palliative Care meeting due by:  10/16/2014    35 minutes CCM time  Billy Fischeravid Earle Troiano, MD ; [facility] service Mobile [phone].  After 5:30 PM or weekends, call [phone]

## 2014-10-12 NOTE — Progress Notes (Signed)
RT note- failed SBT, placed on 16/5 for wean.

## 2014-10-13 ENCOUNTER — Inpatient Hospital Stay (HOSPITAL_COMMUNITY): Payer: Medicare Other

## 2014-10-13 DIAGNOSIS — R911 Solitary pulmonary nodule: Secondary | ICD-10-CM

## 2014-10-13 DIAGNOSIS — J42 Unspecified chronic bronchitis: Secondary | ICD-10-CM

## 2014-10-13 LAB — GLUCOSE, CAPILLARY
Glucose-Capillary: 120 mg/dL — ABNORMAL HIGH (ref 65–99)
Glucose-Capillary: 134 mg/dL — ABNORMAL HIGH (ref 65–99)
Glucose-Capillary: 147 mg/dL — ABNORMAL HIGH (ref 65–99)
Glucose-Capillary: 159 mg/dL — ABNORMAL HIGH (ref 65–99)

## 2014-10-13 LAB — BASIC METABOLIC PANEL
Anion gap: 10 (ref 5–15)
BUN: 83 mg/dL — ABNORMAL HIGH (ref 6–20)
CO2: 24 mmol/L (ref 22–32)
Calcium: 8.2 mg/dL — ABNORMAL LOW (ref 8.9–10.3)
Chloride: 112 mmol/L — ABNORMAL HIGH (ref 101–111)
Creatinine, Ser: 2.13 mg/dL — ABNORMAL HIGH (ref 0.61–1.24)
GFR, EST AFRICAN AMERICAN: 32 mL/min — AB (ref >60)
GFR, EST NON AFRICAN AMERICAN: 27 mL/min — AB (ref >60)
Glucose, Bld: 126 mg/dL — ABNORMAL HIGH (ref 65–99)
POTASSIUM: 4.2 mmol/L (ref 3.5–5.1)
Sodium: 146 mmol/L — ABNORMAL HIGH (ref 135–145)

## 2014-10-13 LAB — CBC
HCT: 30.7 % — ABNORMAL LOW (ref 39.0–52.0)
Hemoglobin: 10 g/dL — ABNORMAL LOW (ref 13.0–17.0)
MCH: 32.7 pg (ref 26.0–34.0)
MCHC: 32.6 g/dL (ref 30.0–36.0)
MCV: 100.3 fL — AB (ref 78.0–100.0)
PLATELETS: 254 10*3/uL (ref 150–400)
RBC: 3.06 MIL/uL — ABNORMAL LOW (ref 4.22–5.81)
RDW: 14.7 % (ref 11.5–15.5)
WBC: 12 10*3/uL — ABNORMAL HIGH (ref 4.0–10.5)

## 2014-10-13 MED ORDER — METOPROLOL TARTRATE 25 MG PO TABS
25.0000 mg | ORAL_TABLET | Freq: Two times a day (BID) | ORAL | Status: DC
  Administered 2014-10-13 – 2014-10-15 (×5): 25 mg
  Filled 2014-10-13 (×7): qty 1

## 2014-10-13 MED ORDER — FREE WATER
200.0000 mL | Status: DC
  Administered 2014-10-13 – 2014-10-17 (×24): 200 mL

## 2014-10-13 MED ORDER — FUROSEMIDE 40 MG PO TABS
40.0000 mg | ORAL_TABLET | Freq: Every day | ORAL | Status: DC
  Administered 2014-10-13 – 2014-10-15 (×3): 40 mg
  Filled 2014-10-13 (×5): qty 1

## 2014-10-13 NOTE — Progress Notes (Signed)
Pt found on these vent settings.  Pressure support was increased by MD and RT was not made aware.  Will continue to monitor.

## 2014-10-13 NOTE — Progress Notes (Signed)
PULMONARY / CRITICAL CARE MEDICINE   Name: Andrew [name] MRN: [id number] DOB: 01-15-1931    ADMISSION DATE:  11-07-2014  INITIAL PRESENTATION: 81 year old male presented to [facility] ED 7/07 with fever, SOB, hypoxemia, pulmonary infiltrates, respiratory acidosis. Failed BiPAP and intubated. PCCM admitted to ICU. PMH:  COPD, CHF, AF, CAD, prior MI  MAJOR EVENTS/TEST RESULTS: 7/07 TTE: Inferior wall hypokinesis. Abnormal septal motion The cavity size was normal. Wall thickness was increased in a pattern of moderate LVH. Systolic function was mildly reduced. The estimated ejection fraction was in the range of 45% to 50%. The left atrium was mildly dilated  INDWELLING DEVICES:: ETT 7/07 >>    MICRO DATA: Urine 7/07 >> NEG Resp 7/07 >> NOF Blood 7/07 >> 1/2 gram variable rod >>    ANTIMICROBIALS:  Vancomycin 7/07 >> 7/08 Azithro 7/07 >> 7/09 Ceftriaxone 7/07 >>     SUBJECTIVE:  RASS 0 to +1. + F/C  VITAL SIGNS: Temp:  [99 F (37.2 C)-100.7 F (38.2 C)] 99 F (37.2 C) (07/10 0855) Pulse Rate:  [35-120] 80 (07/10 0813) Resp:  [17-33] 30 (07/10 0813) BP: (108-147)/(37-79) 147/70 mmHg (07/10 0800) SpO2:  [93 %-100 %] 94 % (07/10 0813) FiO2 (%):  [30 %-40 %] 30 % (07/10 0813) Weight:  [83.9 kg (184 lb 15.5 oz)] 83.9 kg (184 lb 15.5 oz) (07/10 0331) HEMODYNAMICS:   VENTILATOR SETTINGS: Vent Mode:  [-] CPAP;PSV FiO2 (%):  [30 %-40 %] 30 % Set Rate:  [14 bmp] 14 bmp Vt Set:  [600 mL] 600 mL PEEP:  [5 cmH20] 5 cmH20 Pressure Support:  [12 cmH20-16 cmH20] 12 cmH20 Plateau Pressure:  [23 cmH20-31 cmH20] 25 cmH20 INTAKE / OUTPUT:  Intake/Output Summary (Last 24 hours) at 10/13/14 0909 Last data filed at 10/13/14 0800  Gross per 24 hour  Intake 2733.03 ml  Output   1575 ml  Net 1158.03 ml    PHYSICAL EXAMINATION: General: RASS 0, +1, + F/C Neuro: no focal deficits, cognition intact HEENT:  [place]/AT, WNL Cardiovascular:  Reg, paced rhythm, no M Lungs: prolonged expiratory  phase without wheezes, few rhonchi Abdomen:  Soft, NT, +BS Ext: warm, no edema  LABS:  CBC  Recent Labs Lab 10/11/14 0220 10/12/14 0200 10/13/14 0339  WBC 17.1* 11.9* 12.0*  HGB 10.0* 10.1* 10.0*  HCT 30.8* 31.0* 30.7*  PLT 199 226 254   Coag's  Recent Labs Lab 2014-11-07 0750  APTT 40*  INR 2.28*   BMET  Recent Labs Lab 10/11/14 0220 10/12/14 0200 10/13/14 0339  NA 138 141 146*  K 4.4 4.0 4.2  CL 107 109 112*  CO2 BUN 73* 78* 83*  CREATININE 2.59* 2.19* 2.13*  GLUCOSE 136* 130* 126*   Electrolytes  Recent Labs Lab 11/07/2014 0750 10/11/14 0220 10/12/14 0200 10/13/14 0339  CALCIUM 8.3* 8.2* 8.3* 8.2*  MG 2.2  --   --   --   PHOS 4.9*  --   --   --    Sepsis Markers  Recent Labs Lab 11/07/2014 0156 07-Nov-2014 0437 11-07-14 0750 10/11/14 0220 10/12/14 0200  LATICACIDVEN 0.68 0.94 1.1  --   --   PROCALCITON  --   --  1.98 3.08 2.14   ABG  Recent Labs Lab Nov 07, 2014 0424 07-Nov-2014 0557 2014-11-07 0745  PHART 7.227* 7.135* 7.230*  PCO2ART 60.2* 71.6* 55.7*  PO2ART 94.0 81.0 74.9*   Liver Enzymes  Recent Labs Lab 07-Nov-2014 0150 2014/11/07 0750  AST 19 17  ALT  13* 14*  ALKPHOS 79 71  BILITOT 0.6 0.6  ALBUMIN 3.1* 2.7*   Cardiac Enzymes  Recent Labs Lab 10/26/2014 0750 10/20/2014 1415 10/16/2014 1925  TROPONINI 0.05* 0.12* 0.04*   Glucose  Recent Labs Lab 10/12/14 0824 10/12/14 1217 10/12/14 1628 10/12/14 1927 10/12/14 2330 10/13/14 0325  GLUCAP 131* 178* 155* 134* 134* 120*    CXR: diffuse interstitial prominence, LUL nodular density   ASSESSMENT / PLAN:  PULMONARY A: Acute on chronic respiratory failure Mild pulm edema - 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 nodule P:   Cont vent support - settings reviewed and/or adjusted Wean in PSV as tolerated Cont vent bundle Daily SBT if/when meets criteria Cont nebulized steroids Cont nebulized BDs Consider eval  of possible lung nodule as warranted after extubation  CARDIOVASCULAR A:  H/o CAD ([place] 1994), AFib, HTN, HLD Pacemaker in-situ (currently A paced) Mild ischemic CM - LVEF 45-50% P:  Cont monitoring Resume low dose beta blocker 7/10 Resume furosemide daily per home regimen 7/10 Holding ARB    RENAL A:   AKI, nonoliguric - improving CKD - baseline Cr 1.61 Hyperkalemia, resolved P:   Monitor BMET intermittently Monitor I/Os Correct electrolytes as indicated  GASTROINTESTINAL A:   GERD, chronic PPI P:   SUP: enteral pantoprazole Cont TFs  HEMATOLOGIC A:   Chronic anticoagulation (AF) Mild anemia without acute blood loss P:  Cont rivaroxaban per pharm Tranfuse per ICU guidelines  INFECTIOUS A:   CAP, NOS Severe Sepsis, resolved P:   Micro and abx as above  ENDOCRINE A:   Hyperglycemia without prior DM dx P:   Cont CBG monitoring and SSI  NEUROLOGIC A:   Acute metabolic encephalopathy P:   RASS goal: 0 Precedex, PRN fentanyl Daily WUA  FAMILY  - Updates: Wife @ bedside 387/10  - Inter-disciplinary family meet or Palliative Care meeting due by:  10/16/2014    35 minutes CCM time  Billy Fischeravid Aldene Hendon, MD ; [facility] Mobile [phone].  After 5:30 PM or weekends, call [phone]

## 2014-10-13 NOTE — Progress Notes (Signed)
Pt placed on C/PS 12/5 and tolerating well at this time.  RT will continue to monitor.

## 2014-10-13 NOTE — Progress Notes (Signed)
ANTICOAGULATION CONSULT NOTE - Follow Up Consult  Pharmacy Consult:  Xarelto Indication:  History of Afib  Allergies  Allergen Reactions  . Codeine Nausea And Vomiting  . Inspra [Eplerenone] Other (See Comments)    Stomach problems  . Spironolactone Other (See Comments)    Gynecomastia  . Atorvastatin Other (See Comments)    Arthritis/pain  . Crestor [Rosuvastatin] Other (See Comments)    Arthritis/pain ("couldn't hardly walk")  . Doxycycline Itching and Rash  . Simvastatin Other (See Comments)    Arthritis/pain  . Sulfonamide Derivatives Itching and Rash    Patient Measurements: Height: 5\' 11"  (180.3 cm) Weight: 184 lb 15.5 oz (83.9 kg) IBW/kg (Calculated) : 75.3   Vital Signs: Temp: 99 F (37.2 C) (07/10 0855) Temp Source: Oral (07/10 0855) BP: 149/80 mmHg (07/10 1000) Pulse Rate: 88 (07/10 1000)  Labs:  Recent Labs  10/24/2014 1415 10/13/2014 1925  10/11/14 0220 10/12/14 0200 10/13/14 0339  HGB  --   --   < > 10.0* 10.1* 10.0*  HCT  --   --   --  30.8* 31.0* 30.7*  PLT  --   --   --  199 226 254  CREATININE  --   --   --  2.59* 2.19* 2.13*  TROPONINI 0.12* 0.04*  --   --   --   --   < > = values in this interval not displayed.  Estimated Creatinine Clearance: 28.5 mL/min (by C-G formula based on Cr of 2.13).     Assessment: Andrew [name] with history of Afib to continue on Xarelto.  Patient's renal function is improving slowly.  No bleeding reported.   Goal of Therapy:  Full anticoagulation Monitor platelets by anticoagulation protocol: Yes    Plan:  - Continue Xarelto 15mg  PO daily with supper - Consider resuming home meds - Monitor for s/sx of bleeding, renal fxn and adjust Xarelto as appropriate    [name] D. Laney Potashang, PharmD, BCPS Pager:  [phone] 10/13/2014, 10:54 AM

## 2014-10-14 ENCOUNTER — Inpatient Hospital Stay (HOSPITAL_COMMUNITY): Payer: Medicare Other

## 2014-10-14 LAB — BASIC METABOLIC PANEL
ANION GAP: 8 (ref 5–15)
BUN: 77 mg/dL — AB (ref 6–20)
CO2: 26 mmol/L (ref 22–32)
Calcium: 8.3 mg/dL — ABNORMAL LOW (ref 8.9–10.3)
Chloride: 115 mmol/L — ABNORMAL HIGH (ref 101–111)
Creatinine, Ser: 1.61 mg/dL — ABNORMAL HIGH (ref 0.61–1.24)
GFR calc Af Amer: 44 mL/min — ABNORMAL LOW (ref >60)
GFR, EST NON AFRICAN AMERICAN: 38 mL/min — AB (ref >60)
Glucose, Bld: 144 mg/dL — ABNORMAL HIGH (ref 65–99)
Potassium: 3.7 mmol/L (ref 3.5–5.1)
Sodium: 149 mmol/L — ABNORMAL HIGH (ref 135–145)

## 2014-10-14 LAB — GLUCOSE, CAPILLARY
GLUCOSE-CAPILLARY: 132 mg/dL — AB (ref 65–99)
GLUCOSE-CAPILLARY: 133 mg/dL — AB (ref 65–99)
GLUCOSE-CAPILLARY: 137 mg/dL — AB (ref 65–99)
GLUCOSE-CAPILLARY: 137 mg/dL — AB (ref 65–99)
GLUCOSE-CAPILLARY: 151 mg/dL — AB (ref 65–99)
GLUCOSE-CAPILLARY: 164 mg/dL — AB (ref 65–99)
Glucose-Capillary: 131 mg/dL — ABNORMAL HIGH (ref 65–99)
Glucose-Capillary: 128 mg/dL — ABNORMAL HIGH (ref 65–99)

## 2014-10-14 LAB — CBC
HEMATOCRIT: 32.2 % — AB (ref 39.0–52.0)
HEMOGLOBIN: 10.2 g/dL — AB (ref 13.0–17.0)
MCH: 32.1 pg (ref 26.0–34.0)
MCHC: 31.7 g/dL (ref 30.0–36.0)
MCV: 101.3 fL — AB (ref 78.0–100.0)
PLATELETS: 288 10*3/uL (ref 150–400)
RBC: 3.18 MIL/uL — ABNORMAL LOW (ref 4.22–5.81)
RDW: 14.8 % (ref 11.5–15.5)
WBC: 12.3 10*3/uL — AB (ref 4.0–10.5)

## 2014-10-14 LAB — CULTURE, BLOOD (ROUTINE X 2)

## 2014-10-14 MED ORDER — LORAZEPAM 2 MG/ML IJ SOLN
1.0000 mg | Freq: Once | INTRAMUSCULAR | Status: DC
  Filled 2014-10-14: qty 1

## 2014-10-14 MED ORDER — METOCLOPRAMIDE HCL 10 MG PO TABS
10.0000 mg | ORAL_TABLET | Freq: Four times a day (QID) | ORAL | Status: DC | PRN
  Administered 2014-10-14: 10 mg
  Filled 2014-10-14: qty 1

## 2014-10-14 MED ORDER — FUROSEMIDE 10 MG/ML IJ SOLN: 20.0000 mg | Freq: Once | INTRAMUSCULAR | Status: DC

## 2014-10-14 MED ORDER — METOCLOPRAMIDE HCL 10 MG PO TABS: 10.0000 mg | ORAL_TABLET | Freq: Three times a day (TID) | ORAL | Status: DC | PRN

## 2014-10-14 MED ORDER — ONDANSETRON HCL 4 MG/2ML IJ SOLN
4.0000 mg | INTRAMUSCULAR | Status: DC | PRN
  Administered 2014-10-14: 4 mg via INTRAVENOUS
  Filled 2014-10-14: qty 2

## 2014-10-14 NOTE — Progress Notes (Signed)
PULMONARY / CRITICAL CARE MEDICINE   Name: Andrew [name] MRN: [id number] DOB: 04-05-1931    ADMISSION DATE:  10-19-2014  INITIAL PRESENTATION: 81 year old male presented to [facility] ED 7/07 with fever, SOB, hypoxemia, pulmonary infiltrates, respiratory acidosis. Failed BiPAP and intubated. PCCM admitted to ICU. PMH:  COPD, CHF, AF, CAD, prior MI  MAJOR EVENTS/TEST RESULTS: 7/07 TTE: Inferior wall hypokinesis. Abnormal septal motion The cavity size was normal. Wall thickness was increased in a pattern of moderate LVH. Systolic function was mildly reduced. The estimated ejection fraction was in the range of 45% to 50%. The left atrium was mildly dilated  INDWELLING DEVICES:: ETT 7/07 >>    MICRO DATA: Urine 7/07 >> NEG Resp 7/07 >> NOF Blood 7/07 >> 1/2 gram variable rod >> Bacillus species (unknown significance when isolation from only 1 vial).   ANTIMICROBIALS:  Vancomycin 7/07 >> 7/08 Azithro 7/07 >> 7/09 Ceftriaxone 7/07 >>    SUBJECTIVE:  Patient interactive and able to follow directions. No significant events overnight.  VITAL SIGNS: Temp:  [97.5 F (36.4 C)-98.8 F (37.1 C)] 98.8 F (37.1 C) (07/11 1135) Pulse Rate:  [67-103] 78 (07/11 1100) Resp:  [16-31] 25 (07/11 1100) BP: (106-169)/(40-110) 141/53 mmHg (07/11 1100) SpO2:  [91 %-98 %] 98 % (07/11 1100) FiO2 (%):  [30 %-40 %] 40 % (07/11 1210) Weight:  [189 lb 6 oz (85.9 kg)] 189 lb 6 oz (85.9 kg) (07/11 0433) HEMODYNAMICS:   VENTILATOR SETTINGS: Vent Mode:  [-] PSV;CPAP FiO2 (%):  [30 %-40 %] 40 % Set Rate:  [14 bmp] 14 bmp Vt Set:  [600 mL] 600 mL PEEP:  [5 cmH20] 5 cmH20 Pressure Support:  [8 cmH20-15 cmH20] 8 cmH20 Plateau Pressure:  [19 cmH20-23 cmH20] 19 cmH20 INTAKE / OUTPUT:  Intake/Output Summary (Last 24 hours) at 10/14/14 1233 Last data filed at 10/14/14 1100  Gross per 24 hour  Intake 2704.22 ml  Output   1925 ml  Net 779.22 ml    PHYSICAL EXAMINATION: General: Sedated, NAD Neuro: no  focal deficits, cognition intact, RASS -1 HEENT:  [employer]/AT, WNL Cardiovascular:  Reg, paced rhythm, no M Lungs: prolonged expiratory phase without wheezes, some crackles diffusely  Abdomen:  Soft, NT, +BS Ext: warm, no edema  LABS:  CBC  Recent Labs Lab 10/12/14 0200 10/13/14 0339 10/14/14 0207  WBC 11.9* 12.0* 12.3*  HGB 10.1* 10.0* 10.2*  HCT 31.0* 30.7* 32.2*  PLT 226 254 288   Coag's  Recent Labs Lab 10-19-2014 0750  APTT 40*  INR 2.28*   BMET  Recent Labs Lab 10/12/14 0200 10/13/14 0339 10/14/14 0207  NA 141 146* 149*  K 4.0 4.2 3.7  CL 109 112* 115*  CO2 BUN 78* 83* 77*  CREATININE 2.19* 2.13* 1.61*  GLUCOSE 130* 126* 144*   Electrolytes  Recent Labs Lab Oct 19, 2014 0750  10/12/14 0200 10/13/14 0339 10/14/14 0207  CALCIUM 8.3*  < > 8.3* 8.2* 8.3*  MG 2.2  --   --   --   --   PHOS 4.9*  --   --   --   --   < > = values in this interval not displayed. Sepsis Markers  Recent Labs Lab 19-Oct-2014 0156 10/19/2014 0437 10/19/2014 0750 10/11/14 0220 10/12/14 0200  LATICACIDVEN 0.68 0.94 1.1  --   --   PROCALCITON  --   --  1.98 3.08 2.14   ABG  Recent Labs Lab 10-19-14 0424 Oct 19, 2014 0557 10-19-14 0745  PHART 7.227* 7.135* 7.230*  PCO2ART 60.2* 71.6* 55.7*  PO2ART 94.0 81.0 74.9*   Liver Enzymes  Recent Labs Lab 2014-11-19 0150 2014-11-19 0750  AST 19 17  ALT 13* 14*  ALKPHOS 79 71  BILITOT 0.6 0.6  ALBUMIN 3.1* 2.7*   Cardiac Enzymes  Recent Labs Lab 2014-11-19 0750 2014-11-19 1415 2014-11-19 1925  TROPONINI 0.05* 0.12* 0.04*   Glucose  Recent Labs Lab 10/13/14 0325 10/13/14 1653 10/13/14 1943 10/13/14 2333 10/14/14 0333 10/14/14 0754  GLUCAP 120* 159* 147* 133* 131* 151*    CXR: diffuse interstitial prominence, LUL nodular density   ASSESSMENT / PLAN:  PULMONARY A: Acute on chronic respiratory failure Mild pulm edema - interstitial COPD - appears severe @ baseline Possible PNA LUL density, ? nodule P:    Cont vent support - PEEP 5; 40% Wean in PSV as tolerated Cont vent bundle Daily SBT if meets criteria Cont nebulized steroids/BDs Consider eval of possible lung nodule as necessary  CARDIOVASCULAR A:  H/o CAD ([place] 1994), AFib, HTN, HLD Pacemaker in-situ Mild ischemic CM - LVEF 45-50% P:  Cont monitoring Resume Metoprolol 25mg  BID 7/10 Resume home regimen lasix (per tube) 7/10 Holding ARB    RENAL A:   AKI, nonoliguric - improving/resolved CKD - baseline Cr 1.61 P:   Monitor BMET - Cr back to baseline (1.61, 7/11)  - recheck BMP later today (7/11) Monitor I/Os - +4L since admission Correct electrolytes as indicated  GASTROINTESTINAL A:   GERD, chronic PPI P:   SUP: enteral pantoprazole Cont TFs  HEMATOLOGIC A:   Chronic anticoagulation (AF) Mild anemia without acute blood loss P:  DC Xarelto; SCDs for DVT proph Tranfuse per ICU guidelines  INFECTIOUS A:   CAP, NOS Severe Sepsis, resolved P:   Micro as above CTX 7/8>> Vanc/Azith DCd  ENDOCRINE A:   Hyperglycemia without prior DM dx P:   Cont CBG monitoring and SSI  NEUROLOGIC A:   Acute metabolic encephalopathy P:   RASS goal: 0 Precedex, PRN fentanyl Daily WUA  FAMILY  - Updates: Wife @ bedside 367/10  - Inter-disciplinary family meet or Palliative Care meeting due by:  10/16/2014    Kathee DeltonIan D McKeag, MD,MS,  PGY2 10/14/2014 12:33 PM

## 2014-10-14 NOTE — Progress Notes (Signed)
RT note- attempted to wean fio2 while patient on cpap and ps, unable to maintain sp02 , increased fio2 back to 40%

## 2014-10-15 LAB — BASIC METABOLIC PANEL
ANION GAP: 8 (ref 5–15)
ANION GAP: 7 (ref 5–15)
BUN: 71 mg/dL — ABNORMAL HIGH (ref 6–20)
BUN: 65 mg/dL — AB (ref 6–20)
CHLORIDE: 119 mmol/L — AB (ref 101–111)
CO2: 24 mmol/L (ref 22–32)
CO2: 28 mmol/L (ref 22–32)
CREATININE: 1.47 mg/dL — AB (ref 0.61–1.24)
Calcium: 8.1 mg/dL — ABNORMAL LOW (ref 8.9–10.3)
Calcium: 8.1 mg/dL — ABNORMAL LOW (ref 8.9–10.3)
Chloride: 118 mmol/L — ABNORMAL HIGH (ref 101–111)
Creatinine, Ser: 1.62 mg/dL — ABNORMAL HIGH (ref 0.61–1.24)
GFR calc Af Amer: 49 mL/min — ABNORMAL LOW (ref >60)
GFR calc Af Amer: 44 mL/min — ABNORMAL LOW (ref >60)
GFR calc non Af Amer: 43 mL/min — ABNORMAL LOW (ref >60)
GFR, EST NON AFRICAN AMERICAN: 38 mL/min — AB (ref >60)
GLUCOSE: 129 mg/dL — AB (ref 65–99)
Glucose, Bld: 132 mg/dL — ABNORMAL HIGH (ref 65–99)
POTASSIUM: 4.4 mmol/L (ref 3.5–5.1)
Potassium: 4.3 mmol/L (ref 3.5–5.1)
Sodium: 151 mmol/L — ABNORMAL HIGH (ref 135–145)
Sodium: 153 mmol/L — ABNORMAL HIGH (ref 135–145)

## 2014-10-15 LAB — GLUCOSE, CAPILLARY
GLUCOSE-CAPILLARY: 134 mg/dL — AB (ref 65–99)
Glucose-Capillary: 151 mg/dL — ABNORMAL HIGH (ref 65–99)
Glucose-Capillary: 113 mg/dL — ABNORMAL HIGH (ref 65–99)
Glucose-Capillary: 115 mg/dL — ABNORMAL HIGH (ref 65–99)
Glucose-Capillary: 124 mg/dL — ABNORMAL HIGH (ref 65–99)
Glucose-Capillary: 127 mg/dL — ABNORMAL HIGH (ref 65–99)

## 2014-10-15 LAB — CBC
HCT: 31.7 % — ABNORMAL LOW (ref 39.0–52.0)
HEMOGLOBIN: 9.9 g/dL — AB (ref 13.0–17.0)
MCH: 32.4 pg (ref 26.0–34.0)
MCHC: 31.2 g/dL (ref 30.0–36.0)
MCV: 103.6 fL — ABNORMAL HIGH (ref 78.0–100.0)
Platelets: 257 10*3/uL (ref 150–400)
RBC: 3.06 MIL/uL — ABNORMAL LOW (ref 4.22–5.81)
RDW: 15.1 % (ref 11.5–15.5)
WBC: 13 10*3/uL — ABNORMAL HIGH (ref 4.0–10.5)

## 2014-10-15 LAB — CULTURE, BLOOD (ROUTINE X 2): Culture: NO GROWTH

## 2014-10-15 MED ORDER — SODIUM CHLORIDE 0.45 % IV SOLN
INTRAVENOUS | Status: DC
  Administered 2014-10-15 – 2014-10-16 (×3): via INTRAVENOUS

## 2014-10-15 MED ORDER — METOPROLOL TARTRATE 12.5 MG HALF TABLET
12.5000 mg | ORAL_TABLET | Freq: Two times a day (BID) | ORAL | Status: DC
  Administered 2014-10-15 – 2014-10-16 (×2): 12.5 mg
  Filled 2014-10-15 (×3): qty 1

## 2014-10-15 MED ORDER — FENTANYL CITRATE (PF) 100 MCG/2ML IJ SOLN
25.0000 ug | INTRAMUSCULAR | Status: DC | PRN
  Administered 2014-10-15 – 2014-10-17 (×7): 50 ug via INTRAVENOUS
  Administered 2014-10-17 (×3): 25 ug via INTRAVENOUS
  Administered 2014-10-17: 50 ug via INTRAVENOUS
  Administered 2014-10-17: 25 ug via INTRAVENOUS
  Administered 2014-10-17: 50 ug via INTRAVENOUS
  Administered 2014-10-18: 25 ug via INTRAVENOUS
  Administered 2014-10-18 (×3): 50 ug via INTRAVENOUS
  Administered 2014-10-18: 25 ug via INTRAVENOUS
  Administered 2014-10-20 (×3): 50 ug via INTRAVENOUS
  Filled 2014-10-15 (×21): qty 2

## 2014-10-15 MED ORDER — ALPRAZOLAM 0.25 MG PO TABS
0.2500 mg | ORAL_TABLET | Freq: Once | ORAL | Status: AC
  Administered 2014-10-15: 0.25 mg via ORAL
  Filled 2014-10-15: qty 1

## 2014-10-15 MED ORDER — LORAZEPAM 2 MG/ML IJ SOLN
0.5000 mg | Freq: Once | INTRAMUSCULAR | Status: AC
  Administered 2014-10-15: 0.5 mg via INTRAVENOUS

## 2014-10-15 NOTE — Progress Notes (Signed)
Pt had 5 beat run of VTach, patient v-paced as well. Notified Elink RN Marisue IvanLiz and will report to MD Vassie LollAlva. Pt is asymptomatic at this time. Will continue to monitor and assess.

## 2014-10-15 NOTE — Progress Notes (Signed)
PULMONARY / CRITICAL CARE MEDICINE   Name: Andrew [name] MRN: [id number] DOB: 03-31-1931    ADMISSION DATE:  10/23/14  INITIAL PRESENTATION: 81 year old male presented to [facility] ED 7/07 with fever, SOB, hypoxemia, pulmonary infiltrates, respiratory acidosis. Failed BiPAP and intubated. PCCM admitted to ICU. PMH:  COPD, CHF, AF, CAD, prior MI  MAJOR EVENTS/TEST RESULTS: 7/07 TTE: Inferior wall hypokinesis. Abnormal septal motion The cavity size was normal. Wall thickness was increased in a pattern of moderate LVH. Systolic function was mildly reduced. The estimated ejection fraction was in the range of 45% to 50%. The left atrium was mildly dilated  INDWELLING DEVICES:: ETT 7/07 >>   MICRO DATA: Urine 7/07 >> NEG Resp 7/07 >> NOF Blood 7/07 >> 1/2 gram variable rod >> Bacillus species (unknown significance when isolation from only 1 vial).  ANTIMICROBIALS:  Vancomycin 7/07 >> 7/08 Azithro 7/07 >> 7/09 Ceftriaxone 7/07 >>   SUBJECTIVE:  Patient interactive and able to follow directions. No significant events overnight.  VITAL SIGNS: Temp:  [98.2 F (36.8 C)-99.3 F (37.4 C)] 98.2 F (36.8 C) (07/12 0836) Pulse Rate:  [69-102] 71 (07/12 0814) Resp:  [16-31] 23 (07/12 0814) BP: (118-160)/(42-69) 132/67 mmHg (07/12 0814) SpO2:  [90 %-100 %] 98 % (07/12 0814) FiO2 (%):  [30 %-40 %] 40 % (07/12 0814) Weight:  [187 lb 6.3 oz (85 kg)] 187 lb 6.3 oz (85 kg) (07/12 0416) HEMODYNAMICS:   VENTILATOR SETTINGS: Vent Mode:  [-] PRVC FiO2 (%):  [30 %-40 %] 40 % Set Rate:  [14 bmp] 14 bmp Vt Set:  [600 mL] 600 mL PEEP:  [5 cmH20] 5 cmH20 Pressure Support:  [8 cmH20-12 cmH20] 12 cmH20 Plateau Pressure:  [19 cmH20-22 cmH20] 22 cmH20 INTAKE / OUTPUT:  Intake/Output Summary (Last 24 hours) at 10/15/14 1048 Last data filed at 10/15/14 0800  Gross per 24 hour  Intake 2913.3 ml  Output   1825 ml  Net 1088.3 ml    PHYSICAL EXAMINATION: General: NAD, awake, elderly male Neuro: no  focal deficits, cognition intact, RASS -1 HEENT:  [place]/AT, WNL Cardiovascular:  Reg, paced rhythm, no M Lungs: prolonged expiratory phase, some crackles on left  Abdomen:  Soft, NT, +BS Ext: warm, no edema, pulses present bilaterally  LABS:  CBC  Recent Labs Lab 10/13/14 0339 10/14/14 0207 10/15/14 0744  WBC 12.0* 12.3* 13.0*  HGB 10.0* 10.2* 9.9*  HCT 30.7* 32.2* 31.7*  PLT 254 288 257   Coag's  Recent Labs Lab 10/23/14 0750  APTT 40*  INR 2.28*   BMET  Recent Labs Lab 10/13/14 0339 10/14/14 0207 10/15/14 0744  NA 146* 149* 151*  K 4.2 3.7 4.4  CL 112* 115* 119*  CO2 BUN 83* 77* 71*  CREATININE 2.13* 1.61* 1.47*  GLUCOSE 126* 144* 129*   Electrolytes  Recent Labs Lab 10-23-14 0750  10/13/14 0339 10/14/14 0207 10/15/14 0744  CALCIUM 8.3*  < > 8.2* 8.3* 8.1*  MG 2.2  --   --   --   --   PHOS 4.9*  --   --   --   --   < > = values in this interval not displayed. Sepsis Markers  Recent Labs Lab 10-23-14 0156 October 23, 2014 0437 10-23-2014 0750 10/11/14 0220 10/12/14 0200  LATICACIDVEN 0.68 0.94 1.1  --   --   PROCALCITON  --   --  1.98 3.08 2.14   ABG  Recent Labs Lab 10-23-2014 0424 23-Oct-2014 0557 2014-10-23 0745  PHART 7.227* 7.135* 7.230*  PCO2ART 60.2* 71.6* 55.7*  PO2ART 94.0 81.0 74.9*   Liver Enzymes  Recent Labs Lab 10/11/2014 0150 10/29/2014 0750  AST 19 17  ALT 13* 14*  ALKPHOS 79 71  BILITOT 0.6 0.6  ALBUMIN 3.1* 2.7*   Cardiac Enzymes  Recent Labs Lab 10/09/2014 0750 11/03/2014 1415 10/22/2014 1925  TROPONINI 0.05* 0.12* 0.04*   Glucose  Recent Labs Lab 10/14/14 1133 10/14/14 1653 10/14/14 2028 10/14/14 2323 10/15/14 0413 10/15/14 0837  GLUCAP 137* 164* 128* 134* 151* 113*    CXR: diffuse interstitial prominence, LUL nodular density   ASSESSMENT / PLAN:  PULMONARY A: Acute on chronic respiratory failure Mild pulm edema - interstitial COPD - appears severe @ baseline Possible PNA LUL density, ?  nodule P:   Cont vent support - PEEP 5; 40% Wean PSV as tolerated Cont vent bundle SBT: 0/0 Cont nebulized steroids/BDs Consider eval of possible lung nodule as necessary  CARDIOVASCULAR A:  H/o CAD ([place] 1994), AFib, HTN, HLD Pacemaker in-situ Mild ischemic CM - LVEF 45-50% P:  Cont monitoring Metoprolol 12.5mg  BID (reduced from 50mg  bid) Home regimen lasix (per tube) Holding ARB    RENAL A:   AKI, nonoliguric - improving/resolved CKD - baseline Cr 1.61 Hypernatremia P:   Monitor BMP Cr back to baseline (1.47) IVF 1/2NS @ 15200ml/hr for free water replacement (calculated down 3.3L)  - recheck BMP later today (7/12) Monitor I/Os - +5L since admission  - diuresis likely in near future Correcting electrolytes as indicated  GASTROINTESTINAL A:   GERD, chronic PPI P:   SUP: enteral pantoprazole Cont TFs Reglan for nausea  HEMATOLOGIC A:   Chronic anticoagulation (AF) Mild anemia without acute blood loss P:  DC Xarelto; SCDs for DVT proph  - consider restarting SQ hep on 7/13 Tranfuse per ICU guidelines  INFECTIOUS A:   CAP, NOS Severe Sepsis, resolved P:   Micro as above CTX 7/8>>  - last dose to be 7/13  ENDOCRINE A:   Hyperglycemia without prior DM dx P:   Cont CBG monitoring and SSI  NEUROLOGIC A:   Acute metabolic encephalopathy P:   RASS goal: 0 Precedex, PRN fentanyl Daily WUA  FAMILY  - Updates: Wife @ bedside 37/10  - Inter-disciplinary family meet or Palliative Care meeting due by:  10/16/2014    Kathee DeltonIan D McKeag, MD,MS,  PGY2 10/15/2014 10:48 AM

## 2014-10-16 LAB — BASIC METABOLIC PANEL
ANION GAP: 6 (ref 5–15)
BUN: 56 mg/dL — AB (ref 6–20)
CALCIUM: 7.9 mg/dL — AB (ref 8.9–10.3)
CO2: 27 mmol/L (ref 22–32)
Chloride: 113 mmol/L — ABNORMAL HIGH (ref 101–111)
Creatinine, Ser: 1.39 mg/dL — ABNORMAL HIGH (ref 0.61–1.24)
GFR calc non Af Amer: 46 mL/min — ABNORMAL LOW (ref >60)
GFR, EST AFRICAN AMERICAN: 53 mL/min — AB (ref >60)
Glucose, Bld: 120 mg/dL — ABNORMAL HIGH (ref 65–99)
POTASSIUM: 3.8 mmol/L (ref 3.5–5.1)
Sodium: 146 mmol/L — ABNORMAL HIGH (ref 135–145)

## 2014-10-16 LAB — RENAL FUNCTION PANEL
ANION GAP: 6 (ref 5–15)
Albumin: 1.8 g/dL — ABNORMAL LOW (ref 3.5–5.0)
BUN: 65 mg/dL — AB (ref 6–20)
CALCIUM: 8.1 mg/dL — AB (ref 8.9–10.3)
CO2: 27 mmol/L (ref 22–32)
Chloride: 118 mmol/L — ABNORMAL HIGH (ref 101–111)
Creatinine, Ser: 1.42 mg/dL — ABNORMAL HIGH (ref 0.61–1.24)
GFR calc non Af Amer: 44 mL/min — ABNORMAL LOW (ref >60)
GFR, EST AFRICAN AMERICAN: 52 mL/min — AB (ref >60)
GLUCOSE: 134 mg/dL — AB (ref 65–99)
POTASSIUM: 4.2 mmol/L (ref 3.5–5.1)
Phosphorus: 1.8 mg/dL — ABNORMAL LOW (ref 2.5–4.6)
Sodium: 151 mmol/L — ABNORMAL HIGH (ref 135–145)

## 2014-10-16 LAB — CBC WITH DIFFERENTIAL/PLATELET
BASOS PCT: 0 % (ref 0–1)
Basophils Absolute: 0 10*3/uL (ref 0.0–0.1)
EOS ABS: 0.3 10*3/uL (ref 0.0–0.7)
EOS PCT: 3 % (ref 0–5)
HCT: 30.6 % — ABNORMAL LOW (ref 39.0–52.0)
HEMOGLOBIN: 9.5 g/dL — AB (ref 13.0–17.0)
LYMPHS ABS: 0.7 10*3/uL (ref 0.7–4.0)
Lymphocytes Relative: 5 % — ABNORMAL LOW (ref 12–46)
MCH: 32.1 pg (ref 26.0–34.0)
MCHC: 31 g/dL (ref 30.0–36.0)
MCV: 103.4 fL — ABNORMAL HIGH (ref 78.0–100.0)
MONOS PCT: 6 % (ref 3–12)
Monocytes Absolute: 0.8 10*3/uL (ref 0.1–1.0)
Neutro Abs: 11.1 10*3/uL — ABNORMAL HIGH (ref 1.7–7.7)
Neutrophils Relative %: 86 % — ABNORMAL HIGH (ref 43–77)
PLATELETS: 249 10*3/uL (ref 150–400)
RBC: 2.96 MIL/uL — ABNORMAL LOW (ref 4.22–5.81)
RDW: 15.2 % (ref 11.5–15.5)
WBC: 13 10*3/uL — AB (ref 4.0–10.5)

## 2014-10-16 LAB — GLUCOSE, CAPILLARY
GLUCOSE-CAPILLARY: 130 mg/dL — AB (ref 65–99)
GLUCOSE-CAPILLARY: 139 mg/dL — AB (ref 65–99)
GLUCOSE-CAPILLARY: 121 mg/dL — AB (ref 65–99)
Glucose-Capillary: 117 mg/dL — ABNORMAL HIGH (ref 65–99)
Glucose-Capillary: 131 mg/dL — ABNORMAL HIGH (ref 65–99)
Glucose-Capillary: 128 mg/dL — ABNORMAL HIGH (ref 65–99)

## 2014-10-16 LAB — MAGNESIUM: MAGNESIUM: 2.4 mg/dL (ref 1.7–2.4)

## 2014-10-16 LAB — CLOSTRIDIUM DIFFICILE BY PCR: Toxigenic C. Difficile by PCR: NEGATIVE

## 2014-10-16 MED ORDER — FUROSEMIDE 10 MG/ML IJ SOLN
20.0000 mg | Freq: Once | INTRAMUSCULAR | Status: AC
  Administered 2014-10-16: 20 mg via INTRAVENOUS
  Filled 2014-10-16: qty 2

## 2014-10-16 MED ORDER — LABETALOL HCL 5 MG/ML IV SOLN: 10.0000 mg | INTRAVENOUS | Status: DC | PRN

## 2014-10-16 MED ORDER — HEPARIN SODIUM (PORCINE) 5000 UNIT/ML IJ SOLN
5000.0000 [IU] | Freq: Three times a day (TID) | INTRAMUSCULAR | Status: DC
  Administered 2014-10-16 – 2014-10-20 (×14): 5000 [IU] via SUBCUTANEOUS
  Filled 2014-10-16 (×16): qty 1

## 2014-10-16 MED ORDER — IPRATROPIUM-ALBUTEROL 0.5-2.5 (3) MG/3ML IN SOLN
3.0000 mL | Freq: Four times a day (QID) | RESPIRATORY_TRACT | Status: DC
  Administered 2014-10-16 – 2014-10-19 (×13): 3 mL via RESPIRATORY_TRACT
  Filled 2014-10-16 (×12): qty 3

## 2014-10-16 MED ORDER — LORAZEPAM 2 MG/ML IJ SOLN
1.0000 mg | Freq: Four times a day (QID) | INTRAMUSCULAR | Status: DC | PRN
  Administered 2014-10-16 – 2014-10-17 (×4): 1 mg via INTRAVENOUS
  Filled 2014-10-16 (×5): qty 1

## 2014-10-16 MED ORDER — DEXTROSE 5 % IV SOLN
INTRAVENOUS | Status: DC
  Administered 2014-10-16 – 2014-10-17 (×2): via INTRAVENOUS

## 2014-10-16 MED ORDER — K PHOS MONO-SOD PHOS DI & MONO 155-852-130 MG PO TABS
500.0000 mg | ORAL_TABLET | Freq: Three times a day (TID) | ORAL | Status: DC
  Administered 2014-10-16 – 2014-10-17 (×3): 500 mg via ORAL
  Filled 2014-10-16 (×5): qty 2

## 2014-10-16 MED ORDER — CARVEDILOL 12.5 MG PO TABS
12.5000 mg | ORAL_TABLET | Freq: Two times a day (BID) | ORAL | Status: DC
  Administered 2014-10-16 – 2014-10-20 (×7): 12.5 mg via NASOGASTRIC
  Filled 2014-10-16 (×9): qty 1

## 2014-10-16 MED ORDER — POTASSIUM PHOSPHATE MONOBASIC 500 MG PO TABS
500.0000 mg | ORAL_TABLET | Freq: Three times a day (TID) | ORAL | Status: DC
  Filled 2014-10-16 (×3): qty 1

## 2014-10-16 NOTE — Progress Notes (Signed)
Pt hypertensive, tachycardic, and restless. Notified E-link MD. See new orders. PRN dose of fentanyl administered as well. Nursing to continue to monitor.

## 2014-10-16 NOTE — Clinical Documentation Improvement (Signed)
Stated in record that CKD present with baseline creatinine - 1.61.  GFR on admission was 21 and now 44. Creatinine was 2.65 on admission and now 1.42  Would you please help clarify CKD stage? _______CKD Stage I - GFR > OR = 90 _______CKD Stage II - GFR 60-80 _______CKD Stage III - GFR 30-59 _______CKD Stage IV - GFR 15-29 _______CKD Stage V - GFR < 15 _______ESRD (End Stage Renal Disease) _______Other condition_____________ _______Cannot Clinically determine     Thank You, Harrie Jeanseborah L Litisha Guagliardo ,RN Clinical Documentation Specialist:    [facility]- Health Information Management [phone] Cell [phone]

## 2014-10-16 NOTE — Progress Notes (Signed)
eLink Physician-Brief Progress Note Patient Name: Candace Gallusdward L Mccleery DOB: 1930/05/26 MRN: [id number]   Date of Service  10/16/2014  HPI/Events of Note  RN noted BP running high.   eICU Interventions  Changed metoprolol to coreg, started labetalol prn.     Intervention Category Intermediate Interventions: Hypertension - evaluation and management  Shane Crutchradeep Emree Locicero 10/16/2014, 6:13 PM

## 2014-10-16 NOTE — Progress Notes (Signed)
PULMONARY / CRITICAL CARE MEDICINE   Name: Andrew [name] MRN: [id number] DOB: 1931/01/12    ADMISSION DATE:  10/07/2014  INITIAL PRESENTATION: 81 year old male presented to [facility] ED 7/07 with fever, SOB, hypoxemia, pulmonary infiltrates, respiratory acidosis. Failed BiPAP and intubated. PCCM admitted to ICU. PMH:  COPD, CHF, AF, CAD, prior MI  MAJOR EVENTS/TEST RESULTS: 7/07 TTE: Inferior wall hypokinesis. Abnormal septal motion The cavity size was normal. Wall thickness was increased in a pattern of moderate LVH. Systolic function was mildly reduced. The estimated ejection fraction was in the range of 45% to 50%. The left atrium was mildly dilated  INDWELLING DEVICES:: ETT 7/07 >>   MICRO DATA: Urine 7/07 >> NEG Resp 7/07 >> NOF Blood 7/07 >> 1/2 gram variable rod >> Bacillus species (unknown significance when isolation from only 1 vial).  ANTIMICROBIALS:  Vancomycin 7/07 >> 7/08 Azithro 7/07 >> 7/09 Ceftriaxone 7/07 >> 7/13  SUBJECTIVE:  Patient interactive and able to follow directions/answer questions appropriately. Patient had 2 episodes of VT (5 and 10 beats) overnight.  VITAL SIGNS: Temp:  [97.8 F (36.6 C)-98.5 F (36.9 C)] 97.8 F (36.6 C) (07/13 0838) Pulse Rate:  [60-109] 77 (07/13 0945) Resp:  [20-35] 22 (07/13 0945) BP: (90-170)/(40-147) 149/54 mmHg (07/13 1001) SpO2:  [88 %-100 %] 98 % (07/13 1011) FiO2 (%):  [40 %] 40 % (07/13 1011) Weight:  [190 lb 11.2 oz (86.5 kg)] 190 lb 11.2 oz (86.5 kg) (07/13 0500) HEMODYNAMICS:   VENTILATOR SETTINGS: Vent Mode:  [-] Other (Comment) FiO2 (%):  [40 %] 40 % Set Rate:  [14 bmp] 14 bmp Vt Set:  [600 mL] 600 mL PEEP:  [5 cmH20] 5 cmH20 Pressure Support:  [8 cmH20-10 cmH20] 8 cmH20 Plateau Pressure:  [22 cmH20-28 cmH20] 23 cmH20 INTAKE / OUTPUT:  Intake/Output Summary (Last 24 hours) at 10/16/14 1116 Last data filed at 10/16/14 1000  Gross per 24 hour  Intake 4495.62 ml  Output   1690 ml  Net 2805.62 ml     PHYSICAL EXAMINATION: General: NAD, awake, interactive Neuro: no focal deficits, cognition intact, RASS 0 HEENT:  [place]/AT, EOMI, ETT in place Cardiovascular:  RRR, no Murmur Lungs: prolonged expiratory phase, CTAB  Abdomen:  Soft, NT, +BS Ext: no edema, pulses present bilaterally  LABS:  CBC  Recent Labs Lab 10/14/14 0207 10/15/14 0744 10/16/14 0227  WBC 12.3* 13.0* 13.0*  HGB 10.2* 9.9* 9.5*  HCT 32.2* 31.7* 30.6*  PLT 288 257 249   Coag's  Recent Labs Lab 10/30/2014 0750  APTT 40*  INR 2.28*   BMET  Recent Labs Lab 10/15/14 0744 10/15/14 1458 10/16/14 0227  NA 151* 153* 151*  K 4.4 4.3 4.2  CL 119* 118* 118*  CO2 BUN 71* 65* 65*  CREATININE 1.47* 1.62* 1.42*  GLUCOSE 129* 132* 134*   Electrolytes  Recent Labs Lab 10/19/2014 0750  10/15/14 0744 10/15/14 1458 10/16/14 0227  CALCIUM 8.3*  < > 8.1* 8.1* 8.1*  MG 2.2  --   --   --  2.4  PHOS 4.9*  --   --   --  1.8*  < > = values in this interval not displayed. Sepsis Markers  Recent Labs Lab 10/18/2014 0156 10/07/2014 0437 10/28/2014 0750 10/11/14 0220 10/12/14 0200  LATICACIDVEN 0.68 0.94 1.1  --   --   PROCALCITON  --   --  1.98 3.08 2.14   ABG  Recent Labs Lab 10/05/2014 0424 10/18/2014 0557 10/13/2014 0745  PHART 7.227* 7.135* 7.230*  PCO2ART 60.2* 71.6* 55.7*  PO2ART 94.0 81.0 74.9*   Liver Enzymes  Recent Labs Lab 26-Nov-2014 0150 26-Nov-2014 0750 10/16/14 0227  AST 19 17  --   ALT 13* 14*  --   ALKPHOS 79 71  --   BILITOT 0.6 0.6  --   ALBUMIN 3.1* 2.7* 1.8*   Cardiac Enzymes  Recent Labs Lab 26-Nov-2014 0750 26-Nov-2014 1415 26-Nov-2014 1925  TROPONINI 0.05* 0.12* 0.04*   Glucose  Recent Labs Lab 10/15/14 1248 10/15/14 1523 10/15/14 2037 10/15/14 2321 10/16/14 0332 10/16/14 0807  GLUCAP 115* 124* 127* 117* 130* 131*    CXR: diffuse interstitial prominence, LUL nodular density   ASSESSMENT / PLAN:  PULMONARY A: Acute on chronic respiratory failure Mild  pulm edema COPD - likely severe @ baseline Possible PNA LUL density P:   Cont vent support - PEEP 5; 40% Wean PSV as tolerated Cont vent bundle SBT Cont nebulized steroids/BDs Consider eval of possible lung nodule as necessary  CARDIOVASCULAR A:  H/o CAD (MI 1994), AFib, HTN, HLD Pacemaker in-situ Mild ischemic CM - LVEF 45-50% P:  Cont monitoring Metoprolol 12.5mg  BID - consider increasing back to 50mg  bid IV Lasix 20mg  (switched from 40mg  PO) Holding ARB    RENAL A:   AKI, nonoliguric - improving/resolved CKD - baseline Cr 1.61 Hypernatremia Hypophosphatemia P:   Monitor BMP Cr (1.42) IVF D5/water @ 19100ml/hr for free water replacement  - recheck BMP later today (7/12) Monitor I/Os - +7.5L since admission  - soft diuresis w/ goal of ~neg 1L/day K-Phos 500mg  TID x 4 total doses  - approx 16meq Phos per dose  - will watch K+; 3.697meq per dose; anticipate some loss 2/2 lasix Correcting electrolytes as indicated Repeat BMP at 2pm today  GASTROINTESTINAL A:   GERD, chronic PPI P:   SUP: enteral pantoprazole Cont TFs Reglan for nausea  HEMATOLOGIC A:   Chronic anticoagulation (AF) Mild anemia without acute blood loss P:  DC Xarelto; SCDs for DVT proph  - consider restarting SQ hep on 7/13 Tranfuse per ICU guidelines  INFECTIOUS A:   CAP, NOS Severe Sepsis, resolved P:   Micro as above CTX 7/8>>7/13  ENDOCRINE A:   Hyperglycemia without prior DM dx P:   Cont CBG monitoring and SSI  NEUROLOGIC A:   Acute metabolic encephalopathy P:   RASS goal: 0 PRN fentanyl Daily WUA  FAMILY  - Updates: Wife @ bedside 307/10  - Inter-disciplinary family meet or Palliative Care meeting due by:  10/16/2014    Kathee DeltonIan D Miya Luviano, MD,MS,  PGY2 10/16/2014 11:16 AM

## 2014-10-16 NOTE — Progress Notes (Signed)
Nutrition Follow-up  DOCUMENTATION CODES:   Not applicable  INTERVENTION:    Continue TF via OGT with Vital AF 1.2 at 65 ml/h (1560 ml/day) to provide 1872 kcals, 117 gm protein, 1265 ml free water daily.   NUTRITION DIAGNOSIS:   Inadequate oral intake related to inability to eat as evidenced by NPO status.  Ongoing  GOAL:   Patient will meet greater than or equal to 90% of their needs  Met  MONITOR:   Vent status, TF tolerance, Labs, Weight trends   ASSESSMENT:   Patient admitted on 7/7 with complaints of fever and SOB. Required intubation in the ED. Suspected CAP. Hx of COPD.  Discussed patient in ICU rounds and with RN today. Unable to extubate, needs diuresis per CCM. Tolerating TF well. Patient is currently receiving Vital AF 1.2 via OGT at 65 ml/h (1560 ml/day) to provide 1872 kcals, 117 gm protein, 1265 ml free water daily.   Labs reviewed: sodium elevated, phosphorus low. Receiving phosphorus replacement.  Patient is currently intubated on ventilator support MV: 12.5 L/min Temp (24hrs), Avg:98.3 F (36.8 C), Min:97.8 F (36.6 C), Max:98.9 F (37.2 C)  Propofol: off  Diet Order:   NPO  Skin:  Wound (see comment) (right arm open wound)  Last BM:  7/12  Height:   Ht Readings from Last 1 Encounters:  10/15/14 _0  (1.803 m)    Weight:   Wt Readings from Last 1 Encounters:  10/16/14 190 lb 11.2 oz (86.5 kg)   10/29/2014 187 lb (84.823 kg)       Ideal Body Weight:  78.2 kg  BMI:  Body mass index is 26.61 kg/(m^2).  Estimated Nutritional Needs:   Kcal:  1917  Protein:  115-130 gm  Fluid:  2 L  EDUCATION NEEDS:   No education needs identified at this time   Molli Barrows, [place], [place], [place] Pager [phone] After Hours Pager [phone]

## 2014-10-17 LAB — CBC
HEMATOCRIT: 31.7 % — AB (ref 39.0–52.0)
Hemoglobin: 10 g/dL — ABNORMAL LOW (ref 13.0–17.0)
MCH: 33.2 pg (ref 26.0–34.0)
MCHC: 31.5 g/dL (ref 30.0–36.0)
MCV: 105.3 fL — ABNORMAL HIGH (ref 78.0–100.0)
Platelets: 256 10*3/uL (ref 150–400)
RBC: 3.01 MIL/uL — ABNORMAL LOW (ref 4.22–5.81)
RDW: 15.3 % (ref 11.5–15.5)
WBC: 13.4 10*3/uL — AB (ref 4.0–10.5)

## 2014-10-17 LAB — GLUCOSE, CAPILLARY
Glucose-Capillary: 115 mg/dL — ABNORMAL HIGH (ref 65–99)
Glucose-Capillary: 116 mg/dL — ABNORMAL HIGH (ref 65–99)
Glucose-Capillary: 125 mg/dL — ABNORMAL HIGH (ref 65–99)
Glucose-Capillary: 139 mg/dL — ABNORMAL HIGH (ref 65–99)

## 2014-10-17 LAB — BASIC METABOLIC PANEL
Anion gap: 7 (ref 5–15)
Anion gap: 4 — ABNORMAL LOW (ref 5–15)
BUN: 52 mg/dL — AB (ref 6–20)
BUN: 49 mg/dL — ABNORMAL HIGH (ref 6–20)
CALCIUM: 7.9 mg/dL — AB (ref 8.9–10.3)
CHLORIDE: 110 mmol/L (ref 101–111)
CO2: 27 mmol/L (ref 22–32)
CO2: 28 mmol/L (ref 22–32)
Calcium: 7.9 mg/dL — ABNORMAL LOW (ref 8.9–10.3)
Chloride: 114 mmol/L — ABNORMAL HIGH (ref 101–111)
Creatinine, Ser: 1.41 mg/dL — ABNORMAL HIGH (ref 0.61–1.24)
Creatinine, Ser: 1.25 mg/dL — ABNORMAL HIGH (ref 0.61–1.24)
GFR calc Af Amer: 52 mL/min — ABNORMAL LOW (ref >60)
GFR calc Af Amer: >60 mL/min (ref >60)
GFR calc non Af Amer: 45 mL/min — ABNORMAL LOW (ref >60)
GFR calc non Af Amer: 52 mL/min — ABNORMAL LOW (ref >60)
GLUCOSE: 132 mg/dL — AB (ref 65–99)
Glucose, Bld: 130 mg/dL — ABNORMAL HIGH (ref 65–99)
Potassium: 3.5 mmol/L (ref 3.5–5.1)
Potassium: 4 mmol/L (ref 3.5–5.1)
Sodium: 144 mmol/L (ref 135–145)
Sodium: 146 mmol/L — ABNORMAL HIGH (ref 135–145)

## 2014-10-17 LAB — PHOSPHORUS: PHOSPHORUS: 2.9 mg/dL (ref 2.5–4.6)

## 2014-10-17 MED ORDER — AMIODARONE HCL IN DEXTROSE 360-4.14 MG/200ML-% IV SOLN
30.0000 mg/h | INTRAVENOUS | Status: DC
  Administered 2014-10-18 – 2014-10-20 (×5): 30 mg/h via INTRAVENOUS
  Filled 2014-10-17 (×14): qty 200

## 2014-10-17 MED ORDER — FUROSEMIDE 10 MG/ML IJ SOLN
20.0000 mg | Freq: Two times a day (BID) | INTRAMUSCULAR | Status: DC
  Administered 2014-10-17: 20 mg via INTRAVENOUS
  Filled 2014-10-17: qty 2

## 2014-10-17 MED ORDER — FUROSEMIDE 10 MG/ML IJ SOLN
40.0000 mg | Freq: Two times a day (BID) | INTRAMUSCULAR | Status: DC
  Administered 2014-10-17 – 2014-10-18 (×2): 40 mg via INTRAVENOUS
  Filled 2014-10-17 (×4): qty 4

## 2014-10-17 MED ORDER — FREE WATER
200.0000 mL | Freq: Four times a day (QID) | Status: DC
  Administered 2014-10-17 – 2014-10-20 (×13): 200 mL

## 2014-10-17 MED ORDER — AMIODARONE HCL IN DEXTROSE 360-4.14 MG/200ML-% IV SOLN
60.0000 mg/h | INTRAVENOUS | Status: AC
  Administered 2014-10-17 – 2014-10-18 (×2): 60 mg/h via INTRAVENOUS

## 2014-10-17 MED ORDER — FUROSEMIDE 10 MG/ML IJ SOLN
20.0000 mg | Freq: Once | INTRAMUSCULAR | Status: AC
  Administered 2014-10-17: 20 mg via INTRAVENOUS
  Filled 2014-10-17: qty 2

## 2014-10-17 MED ORDER — LORAZEPAM 2 MG/ML IJ SOLN
1.0000 mg | INTRAMUSCULAR | Status: DC | PRN
  Administered 2014-10-17 – 2014-10-19 (×7): 1 mg via INTRAVENOUS
  Administered 2014-10-20: 0.5 mg via INTRAVENOUS
  Filled 2014-10-17 (×7): qty 1

## 2014-10-17 MED ORDER — POTASSIUM CHLORIDE 20 MEQ/15ML (10%) PO SOLN
20.0000 meq | ORAL | Status: AC
  Administered 2014-10-17 (×2): 20 meq
  Filled 2014-10-17 (×2): qty 15

## 2014-10-17 NOTE — Progress Notes (Signed)
MD to bedside for rounds, patient agitated and tearful. Plan to diurese and re-assess extubation later today. Patient placed back on PRVC, see MAR for sedation medication administered.

## 2014-10-17 NOTE — Progress Notes (Signed)
Chaplain responded to code blue.  Pt revived upon chaplain arrival, pt's daughter in back of room.  Chaplain provided emotional support and advocacy for family throughout code and followed up later with other family at bedside.  Chaplain available to follow up as needed.    10/17/14 1800  Clinical Encounter Type  Visited With Patient and family together;Health care provider  Visit Type Initial;Social support;Code;Critical Care  Referral From Care management  Spiritual Encounters  Spiritual Needs Emotional  Stress Factors  Patient Stress Factors Health changes  Family Stress Factors Health changes;Lack of knowledge    Andrew [name], Andrew [name], Chaplain 10/17/2014

## 2014-10-17 NOTE — Progress Notes (Signed)
PULMONARY / CRITICAL CARE MEDICINE   Name: Andrew [name] MRN: [id number] DOB: Oct 07, 1930    ADMISSION DATE:  10-25-14  INITIAL PRESENTATION: 81 year old male presented to [facility] ED 7/07 with fever, SOB, hypoxemia, pulmonary infiltrates, respiratory acidosis. Failed BiPAP and intubated. PCCM admitted to ICU. PMH:  COPD, CHF, AF, CAD, prior MI  MAJOR EVENTS/TEST RESULTS: 7/07 TTE: Inferior wall hypokinesis. Abnormal septal motion The cavity size was normal. Wall thickness was increased in a pattern of moderate LVH. Systolic function was mildly reduced. The estimated ejection fraction was in the range of 45% to 50%. The left atrium was mildly dilated  INDWELLING DEVICES:: ETT 7/07 >>   MICRO DATA: Urine 7/07 >> NEG Resp 7/07 >> NOF Blood 7/07 >> 1/2 gram variable rod >> Bacillus species (unknown significance when isolation from only 1 vial).  ANTIMICROBIALS:  Vancomycin 7/07 >> 7/08 Azithro 7/07 >> 7/09 Ceftriaxone 7/07 >> 7/13  SUBJECTIVE:  Patient interactive and able to follow directions/answer questions appropriately. In obvious agitation toward ETT placement. Daughter at bedside. No acute events overnight.  VITAL SIGNS: Temp:  [97.8 F (36.6 C)-100.5 F (38.1 C)] 99.6 F (37.6 C) (07/14 0842) Pulse Rate:  [74-116] 110 (07/14 0915) Resp:  [13-33] 29 (07/14 0915) BP: (87-191)/(42-139) 171/77 mmHg (07/14 0915) SpO2:  [94 %-100 %] 94 % (07/14 0915) FiO2 (%):  [40 %] 40 % (07/14 0915) Weight:  [194 lb 0.1 oz (88 kg)] 194 lb 0.1 oz (88 kg) (07/14 0500) HEMODYNAMICS:   VENTILATOR SETTINGS: Vent Mode:  [-] PRVC FiO2 (%):  [40 %] 40 % Set Rate:  [14 bmp] 14 bmp Vt Set:  [600 mL] 600 mL PEEP:  [5 cmH20] 5 cmH20 Pressure Support:  [12 cmH20] 12 cmH20 Plateau Pressure:  [16 cmH20-37 cmH20] 37 cmH20 INTAKE / OUTPUT:  Intake/Output Summary (Last 24 hours) at 10/17/14 1129 Last data filed at 10/17/14 0700  Gross per 24 hour  Intake   3500 ml  Output   2150 ml  Net   1350 ml     PHYSICAL EXAMINATION: General: NAD, awake, answers yes/no questions appropriately Neuro: no focal deficits, RASS 0 HEENT:  [place]/AT, MMM, ETT in place Cardiovascular:  RRR, no murmur Lungs: CTAB, good airflow Abdomen:  Soft, NT, +BS Ext: no edema, pulses present bilaterally  LABS:  CBC  Recent Labs Lab 10/15/14 0744 10/16/14 0227 10/17/14 0238  WBC 13.0* 13.0* 13.4*  HGB 9.9* 9.5* 10.0*  HCT 31.7* 30.6* 31.7*  PLT 257 249 256   Coag's No results for input(s): APTT, INR in the last 168 hours. BMET  Recent Labs Lab 10/16/14 0227 10/16/14 1620 10/17/14 0238  NA 151* 146* 144  K 4.2 3.8 3.5  CL 118* 113* 110  CO2 BUN 65* 56* 52*  CREATININE 1.42* 1.39* 1.41*  GLUCOSE 134* 120* 130*   Electrolytes  Recent Labs Lab 10/16/14 0227 10/16/14 1620 10/17/14 0238  CALCIUM 8.1* 7.9* 7.9*  MG 2.4  --   --   PHOS 1.8*  --  2.9   Sepsis Markers  Recent Labs Lab 10/11/14 0220 10/12/14 0200  PROCALCITON 3.08 2.14   ABG No results for input(s): PHART, PCO2ART, PO2ART in the last 168 hours. Liver Enzymes  Recent Labs Lab 10/16/14 0227  ALBUMIN 1.8*   Cardiac Enzymes  Recent Labs Lab 2014/10/25 1415 2014/10/25 1925  TROPONINI 0.12* 0.04*   Glucose  Recent Labs Lab 10/16/14 0807 10/16/14 1116 10/16/14 1611 10/16/14 1929 10/17/14 0021 10/17/14 0411  GLUCAP 131* 139* 121* 128* 115* 125*    CXR: diffuse interstitial prominence, LUL nodular density   ASSESSMENT / PLAN:  PULMONARY ETT 7/7 >>   - hope to extubate when adequate correction of fluid overload established A: Acute on chronic respiratory failure COPD - likely severe @ baseline Possible PNA - s/p full abx course LUL density P:   Cont vent support - PEEP 5; 40% Cont vent bundle SBT Cont nebulized steroids/BDs Consider eval of possible lung nodule as necessary  CARDIOVASCULAR A:  H/o CAD (MI 1994), AFib, HTN, HLD Pacemaker in-situ Mild ischemic CM - LVEF 45-50% P:   Cont monitoring Coreg 12.5mg  DC Metoprolol Labetalol PRN for tachy IV Lasix as below Holding ARB    RENAL A:   AKI, nonoliguric - improving/resolved CKD - baseline Cr 1.61 Hypernatremia Hypophosphatemia - resolved P:   Monitor BMP Cr (1.41) DC D5/water Cont Free water IV Lasix 40mg  BID Monitor I/Os - +9.3L since admission  - Neg goal DC K Phos S/p K replacement - 40meq total Cont correcting electrolytes as indicated Repeat BMP at 2pm today  GASTROINTESTINAL A:   GERD, chronic PPI Diarrhea P:   SUP: enteral pantoprazole CDiff PCR neg Cont TFs  - May be cause for diarrhea Reglan for nausea  HEMATOLOGIC A:   Chronic anticoagulation (AF) Mild anemia without acute blood loss P:  Hold home Xarelto Restart SQ hep 7/13 Tranfuse per ICU guidelines  INFECTIOUS A:   CAP, NOS - abx course completed Severe Sepsis, resolved P:   Micro as above CTX 7/8>>7/13  ENDOCRINE A:   Hyperglycemia - controlled P:   Cont CBG monitoring and SSI  NEUROLOGIC A:   Acute metabolic encephalopathy P:   RASS goal: 0 PRN fentanyl Daily WUA  FAMILY  - Updates: Discussed treatment plan and criteria for extubation w/ daughter at bedside 7/14 - Inter-disciplinary family meet or Palliative Care meeting due by:  10/16/2014    Kathee DeltonIan D McKeag, MD,MS,  PGY2 10/17/2014 11:29 AM

## 2014-10-17 NOTE — Progress Notes (Signed)
Into patient room, foley bag leaking at drainage tip. E-md advised, orders to change foley bag.

## 2014-10-17 NOTE — Progress Notes (Signed)
[facility]PLACEMENT ONLY  The patient does [facility] the [facility] Protocol based on the criteria listed below:   1. Is GFR >/= 40 ml/min? Yes.    Patient's GFR today is 45 2. Is urine output >/= 0.5 ml/kg/hr for the last 6 hours? Yes.   Patient's UOP is 1.3 ml/kg/hr 3. Is BUN < 60 mg/dL? Yes.    Patient's BUN today is 52 4. Abnormal electrolyte(s):K3.5 5. Ordered repletion with: per protocol 6. If a panic level lab has been reported, has the CCM MD in charge been notified? Yes.  .   Physician:  Elisabeth Cara Alva, MD  Melrose NakayamaChisholm, Andrew [name] 10/17/2014 6:35 AM

## 2014-10-17 NOTE — Evaluation (Signed)
Physical Therapy Evaluation Patient Details Name: Andrew [name] MRN: [id number] DOB: [birth year] Today's Date: 10/17/2014   History of Present Illness  Adm 7/7 with CAP, sepsis, COPD exacerbation? intubated 7/7; AKI  PMHx- COPD, CAD with ischemic CM EF 45%, biventricular PPM  Clinical Impression  Pt admitted with above diagnosis. Currently vent dependent and fatigued quickly (on full support). Anticipate will need post-acute stay for therapies, however level of care to be determined based on ability to wean and activity tolerance. Pt currently with functional limitations due to the deficits listed below (see PT Problem List).  Pt will benefit from skilled PT to increase their independence and safety with mobility to allow discharge to the venue listed below.       Follow Up Recommendations  (?LTAC--depends on ability to wean)    Equipment Recommendations   (TBA)    Recommendations for Other Services OT consult     Precautions / Restrictions Precautions Precautions: Fall Restrictions Weight Bearing Restrictions: No      Mobility  Bed Mobility Overal bed mobility: Needs Assistance Bed Mobility: Rolling;Supine to Sit;Sit to Supine Rolling: Min assist   Supine to sit: Total assist;+2 for physical assistance;HOB elevated;+2 for safety/equipment Sit to supine: Total assist;+2 for physical assistance;+2 for safety/equipment   General bed mobility comments: awake initially, as session progressed became sleepy with frequent eye closing; per RN likely fatigued from recent time weaning   Transfers                    Ambulation/Gait                Stairs            Wheelchair Mobility    Modified Rankin (Stroke Patients Only)       Balance Overall balance assessment: Needs assistance Sitting-balance support: No upper extremity supported;Feet supported Sitting balance-Leahy Scale: Zero   Postural control: Posterior lean                                    Pertinent Vitals/Pain On ventilator: PRVC PS 12, PEEP 5  RR 20 SaO2 99-100% (FiO2 40%) HR 100 -110  Pain Assessment: No/denies pain    Home Living Family/patient expects to be discharged to:: Private residence Living Arrangements: Spouse/significant other Available Help at Discharge: Family (wife, 20 yrs younger) Type of Home: House Home Access: Stairs to enter Entrance Stairs-Rails: Right (? 2; dtr unsure) Entrance Stairs-Number of Steps: 3 Home Layout: One level Home Equipment: Walker - 2 wheels (pt fatigued and dtr unsure)      Prior Function Level of Independence: Independent         Comments: Dtr reports he tended to shuffle; was able to drive, go out to eat, however at home was fairly sedentary     Hand Dominance   Dominant Hand: Right    Extremity/Trunk Assessment   Upper Extremity Assessment: Generalized weakness (Rt biceps/triceps 3+ (?fatigue); Lt 4/5)           Lower Extremity Assessment: Generalized weakness (AROM WFL; Rt knee ext 4/5, Lt knee ext 3+)      Cervical / Trunk Assessment: Kyphotic  Communication   Communication: Other (comment) (intubated)  Cognition Arousal/Alertness: Awake/alert (initially, fatigued quickly with falling asleep sitting) Behavior During Therapy: WFL for tasks assessed/performed Overall Cognitive Status: Difficult to assess  General Comments General comments (skin integrity, edema, etc.): Daughter present. Unusre how much physical assist wife could provide (overweight).    Exercises Other Exercises Other Exercises: AROM x 4 extremities, all joints and planeds of motion      Assessment/Plan    PT Assessment Patient needs continued PT services  PT Diagnosis Generalized weakness   PT Problem List Decreased strength;Decreased activity tolerance;Decreased balance;Decreased mobility;Decreased knowledge of use of DME;Cardiopulmonary status limiting activity  PT  Treatment Interventions DME instruction;Gait training;Functional mobility training;Therapeutic activities;Therapeutic exercise;Balance training;Cognitive remediation;Patient/family education   PT Goals (Current goals can be found in the Care Plan section) Acute Rehab PT Goals Patient Stated Goal: unable due to intubation PT Goal Formulation: With family Time For Goal Achievement: 10/31/14 Potential to Achieve Goals: Good    Frequency Min 3X/week   Barriers to discharge        Co-evaluation               End of Session   Activity Tolerance: Patient limited by fatigue Patient left: in bed;with call bell/phone within reach;with family/visitor present Nurse Communication: Mobility status         Time: 1610-9604 PT Time Calculation (min) (ACUTE ONLY): 22 min   Charges:   PT Evaluation $Initial PT Evaluation Tier I: 1 Procedure     PT G Codes:        Andrew [name] 10-25-14, 10:19 AM Pager [phone]

## 2014-10-17 NOTE — Code Documentation (Signed)
  Patient Name: Candace Gallusdward L Obremski   MRN: [id number]   Date of Birth/ Sex: December 09, 1930 , male      Admission Date: 11/03/2014  Attending Provider: Kalman ShanMurali Ramaswamy, MD  Primary Diagnosis: <principal problem not specified>   Indication: Pt was in his usual state of health until this PM, when he was noted to be unresponsive. Code blue was subsequently called. At the time of arrival on scene, ACLS protocol was underway.   Technical Description:  - CPR performance duration:  10  minutes  - Was defibrillation or cardioversion used? Yes   - Was external pacer placed? No  - Was patient intubated pre/post CPR? Pre   Medications Administered: Y = Yes; Blank = No Amiodarone  Y  Atropine    Calcium    Epinephrine  Y  Lidocaine    Magnesium    Norepinephrine    Phenylephrine    Sodium bicarbonate    Vasopressin     Post CPR evaluation:  - Final Status - Was patient successfully resuscitated ? Yes - What is current rhythm? Paced ventricular rhythm - What is current hemodynamic status? Stable  Miscellaneous Information:  - Labs sent, including: Magnesium, phosphorus, Bmet, Troponin, CBC, EKG 12-Lead  - Primary team notified?  Yes  - Family Notified? Yes, family present at code  - Additional notes/ transfer status: Patient remaining in ICU     Fuller Planhristopher W Suhana Wilner, MD  10/17/2014, 8:08 PM

## 2014-10-17 NOTE — Progress Notes (Signed)
eLink Physician-Brief Progress Note Patient Name: Andrew [name] L Sandell DOB: June 28, 1930 MRN: [id number]   Date of Service  10/17/2014  HPI/Events of Note  Foley bag leaking.   eICU Interventions  Will ask nurse to exchange the foley bag.     Intervention Category Minor Interventions: Routine modifications to care plan (e.g. PRN medications for pain, fever)  Sommer,Steven Eugene 10/17/2014, 4:47 PM

## 2014-10-18 ENCOUNTER — Inpatient Hospital Stay (HOSPITAL_COMMUNITY): Payer: Medicare Other

## 2014-10-18 DIAGNOSIS — I4901 Ventricular fibrillation: Secondary | ICD-10-CM

## 2014-10-18 DIAGNOSIS — I5023 Acute on chronic systolic (congestive) heart failure: Secondary | ICD-10-CM

## 2014-10-18 LAB — BASIC METABOLIC PANEL
ANION GAP: 10 (ref 5–15)
BUN: 46 mg/dL — AB (ref 6–20)
CHLORIDE: 107 mmol/L (ref 101–111)
CO2: 27 mmol/L (ref 22–32)
Calcium: 8.2 mg/dL — ABNORMAL LOW (ref 8.9–10.3)
Creatinine, Ser: 1.37 mg/dL — ABNORMAL HIGH (ref 0.61–1.24)
GFR calc non Af Amer: 46 mL/min — ABNORMAL LOW (ref >60)
GFR, EST AFRICAN AMERICAN: 54 mL/min — AB (ref >60)
Glucose, Bld: 142 mg/dL — ABNORMAL HIGH (ref 65–99)
POTASSIUM: 3.5 mmol/L (ref 3.5–5.1)
Sodium: 144 mmol/L (ref 135–145)

## 2014-10-18 LAB — CBC
HCT: 29.6 % — ABNORMAL LOW (ref 39.0–52.0)
HEMATOCRIT: 34.8 % — AB (ref 39.0–52.0)
Hemoglobin: 10.5 g/dL — ABNORMAL LOW (ref 13.0–17.0)
Hemoglobin: 9 g/dL — ABNORMAL LOW (ref 13.0–17.0)
MCH: 31.4 pg (ref 26.0–34.0)
MCH: 31.4 pg (ref 26.0–34.0)
MCHC: 30.2 g/dL (ref 30.0–36.0)
MCHC: 30.4 g/dL (ref 30.0–36.0)
MCV: 104.2 fL — ABNORMAL HIGH (ref 78.0–100.0)
MCV: 103.1 fL — AB (ref 78.0–100.0)
PLATELETS: 279 10*3/uL (ref 150–400)
Platelets: 220 10*3/uL (ref 150–400)
RBC: 3.34 MIL/uL — AB (ref 4.22–5.81)
RBC: 2.87 MIL/uL — ABNORMAL LOW (ref 4.22–5.81)
RDW: 14.9 % (ref 11.5–15.5)
RDW: 14.8 % (ref 11.5–15.5)
WBC: 9.9 10*3/uL (ref 4.0–10.5)
WBC: 11.5 10*3/uL — AB (ref 4.0–10.5)

## 2014-10-18 LAB — RENAL FUNCTION PANEL
ANION GAP: 6 (ref 5–15)
Albumin: 1.8 g/dL — ABNORMAL LOW (ref 3.5–5.0)
BUN: 50 mg/dL — AB (ref 6–20)
CALCIUM: 7.8 mg/dL — AB (ref 8.9–10.3)
CO2: 29 mmol/L (ref 22–32)
CREATININE: 1.45 mg/dL — AB (ref 0.61–1.24)
Chloride: 108 mmol/L (ref 101–111)
GFR calc Af Amer: 50 mL/min — ABNORMAL LOW (ref >60)
GFR calc non Af Amer: 43 mL/min — ABNORMAL LOW (ref >60)
GLUCOSE: 112 mg/dL — AB (ref 65–99)
POTASSIUM: 3.7 mmol/L (ref 3.5–5.1)
Phosphorus: 3.6 mg/dL (ref 2.5–4.6)
SODIUM: 143 mmol/L (ref 135–145)

## 2014-10-18 LAB — GLUCOSE, CAPILLARY
GLUCOSE-CAPILLARY: 100 mg/dL — AB (ref 65–99)
GLUCOSE-CAPILLARY: 105 mg/dL — AB (ref 65–99)
GLUCOSE-CAPILLARY: 117 mg/dL — AB (ref 65–99)
Glucose-Capillary: 122 mg/dL — ABNORMAL HIGH (ref 65–99)
Glucose-Capillary: 144 mg/dL — ABNORMAL HIGH (ref 65–99)
Glucose-Capillary: 122 mg/dL — ABNORMAL HIGH (ref 65–99)
Glucose-Capillary: 110 mg/dL — ABNORMAL HIGH (ref 65–99)
Glucose-Capillary: 116 mg/dL — ABNORMAL HIGH (ref 65–99)
Glucose-Capillary: 121 mg/dL — ABNORMAL HIGH (ref 65–99)

## 2014-10-18 LAB — PHOSPHORUS: PHOSPHORUS: 3 mg/dL (ref 2.5–4.6)

## 2014-10-18 LAB — TROPONIN I
Troponin I: 0.45 ng/mL — ABNORMAL HIGH (ref <0.031)
Troponin I: 0.4 ng/mL — ABNORMAL HIGH (ref <0.031)

## 2014-10-18 LAB — MAGNESIUM: MAGNESIUM: 2 mg/dL (ref 1.7–2.4)

## 2014-10-18 MED ORDER — FUROSEMIDE 10 MG/ML IJ SOLN
40.0000 mg | Freq: Three times a day (TID) | INTRAMUSCULAR | Status: DC
  Administered 2014-10-18 – 2014-10-20 (×7): 40 mg via INTRAVENOUS
  Filled 2014-10-18 (×7): qty 4

## 2014-10-18 MED ORDER — DEXMEDETOMIDINE HCL IN NACL 400 MCG/100ML IV SOLN
0.1000 ug/kg/h | INTRAVENOUS | Status: DC
Start: 2014-10-18 — End: 2014-10-20
  Administered 2014-10-18: 0.4 ug/kg/h via INTRAVENOUS
  Administered 2014-10-18: 0.5 ug/kg/h via INTRAVENOUS
  Administered 2014-10-18: 0.4 ug/kg/h via INTRAVENOUS
  Administered 2014-10-19 – 2014-10-20 (×4): 0.5 ug/kg/h via INTRAVENOUS
  Filled 2014-10-18: qty 50
  Filled 2014-10-18 (×2): qty 100
  Filled 2014-10-18: qty 50
  Filled 2014-10-18 (×2): qty 100
  Filled 2014-10-18 (×2): qty 50

## 2014-10-18 MED FILL — Medication: Qty: 1 | Status: AC

## 2014-10-18 NOTE — Progress Notes (Signed)
Observed patient HR on monitor at 254. Patient in vfib/vtach rhythm and no longer conscious. Code called, and ACLS measures taken.  See code blue flow sheet for further information.

## 2014-10-18 NOTE — Consult Note (Signed)
Reason for Consult: Worsening heart failure and post arrest  Referring Physician: Critical Care  PCP:  Jenny Reichmann, MD  Primary Cardiologist: Dr. Percival Spanish cardiology, Dr. Caryl Comes EP, heart failure clinic  Andrew [name] is an 81 y.o. male.    Chief Complaint: Patient admitted for resp distress and CAP 10/20/2014  HPI:   Andrew [name] is an 81 year old male patient admitted to [facility] on 10/23/2014 with fever, SOB, hypoxemia, pulmonary infiltrates, and resp acidosis. He was diagnosed with CAP and severe sepsis and was intubated for acute on chronic resp failure.  [name] He developed acute kidney injury which has resolved. He also has acute metabolic encephalopathy.  History of severe COPD, CHF, CAD, prior MI X 3 and multiple stents to RCA , pacemaker and CAD anticoagulated. His Xarelto has been held since admission. He is on SQ heparin.  He has ischemic cardiomyopathy, a Medtronic CRT-D device and is followed at the heart failure clinic.  On the evening of 10/17/2014 the patient had a v-tach/vfib arrest with 3 minutes of VFib during which ACLS was started and he was defibrillated with 150 and amiodarone was given.  Post arrest EKG 10/17/2014 1922 show sinus rhythm with ventricular pacing and PVC's Post arrest troponins: 0.45, 0.40 ( prior admit troponin pk of 0.12)   Echo 10/04/2014: Study Conclusions - Left ventricle: Inferior wall hypokinesis. Abnormal septal motion The cavity size was normal. Wall thickness was increased in a pattern of moderate LVH. Systolic function was mildly reduced. The estimated ejection fraction was in the range of 45% to 50%. - Left atrium: The atrium was mildly dilated. - Atrial septum: No defect or patent foramen ovale was identified.  Most recent heart cath 11/21/2006:  1. Inferior hypokinesis with mild reduction of global ejection  fraction. 2. Somewhat diffuse scattered three-vessel coronary disease with 50-  60% distal right coronary  artery narrowing, 30-50% proximal obtuse  marginal, 50-60% proximal left anterior descending and 50% proximal  diagonal vessel.   Treated medically  No recent Nucs.   Currently with no chest pain, on vent.but shakes head yes and no to questions.  Resting comfortably.     Past Medical History  Diagnosis Date  . Chronic systolic heart failure     a. Chronic LV systolic failure. b. Echo in Dec 2011 showed EF of 25% - f/u echo 02/2011: Mild LVH, EF 15%, posterior lateral akinesis, inferior akinesis, grade 1 diastolic dysfunction, mild LAE. c. s/p BiV pacemaker 06/2011.  [name] Atrial flutter     a. Off amiodarone because of increased LFTs  . COPD (chronic obstructive pulmonary disease)     Chronic SOB, O2 dependent  . Myocardial infarction 1994  . Gastro - esophageal reflux   . Claudication   . BPH (benign prostatic hyperplasia)   . Coronary artery disease     a. Myocardial infarction in 1984,  99 and 07. b. H/o multiple RCA stents. c.  LHC 04/2011: without obstructive disease. oLM 25%, mLAD 30%, pCFX 25%, prox to mid RCA stent ok, dRCA 30% and 70%, pPDA 25%, mPL 25-30%, EF 25%.  [name] Hypertension   . Atrial fibrillation     a. On Xarelto.  . Hemorrhoids   . Hyperlipidemia     statin intolerant 2/2 myalgias  . Hx of adenomatous colonic polyps   . Hx of colonoscopy   . Chronic renal insufficiency   . Biventricular cardiac pacemaker in situ 3.2013  . NSVT (nonsustained ventricular  tachycardia)     a. 04/2012- 6 beats on tele.  [name] Shortness of breath   . Arthritis     hips ,back ,hands  . Iron deficiency anemia, unspecified 03/20/2013    Past Surgical History  Procedure Laterality Date  . Cardiac catheterization  2008  . Cataract extraction    . Salivary gland surgery    . Coronary angioplasty with stent placement    . Insert / replace / remove pacemaker  06/16/2011  . Tee without cardioversion  05/08/2012    Procedure: TRANSESOPHAGEAL ECHOCARDIOGRAM (TEE);  Surgeon: Thayer Headings, MD;  Location: [place];  Service: Cardiovascular;  Laterality: N/A;  . Bi-ventricular pacemaker insertion N/A 06/16/2011    Procedure: BI-VENTRICULAR PACEMAKER INSERTION (CRT-P);  Surgeon: Deboraha Sprang, MD;  Location: [facility] CATH LAB;  Service: Cardiovascular;  Laterality: N/A;    Family History  Problem Relation Age of Onset  . Coronary artery disease Father   . Heart disease Mother   . Colon cancer Neg Hx   . Heart disease Brother    Social History:  reports that he quit smoking about 5 years ago. His smoking use included Cigarettes. He started smoking about 60 years ago. He has a 82.5 pack-year smoking history. He has never used smokeless tobacco. He reports that he does not drink alcohol or use illicit drugs.  Allergies:  Allergies  Allergen Reactions  . Codeine Nausea And Vomiting  . Inspra [Eplerenone] Other (See Comments)    Stomach problems  . Spironolactone Other (See Comments)    Gynecomastia  . Atorvastatin Other (See Comments)    Arthritis/pain  . Crestor [Rosuvastatin] Other (See Comments)    Arthritis/pain ("couldn't hardly walk")  . Doxycycline Itching and Rash  . Simvastatin Other (See Comments)    Arthritis/pain  . Sulfonamide Derivatives Itching and Rash   Scheduled Meds: . antiseptic oral rinse  7 mL Mouth Rinse QID  . budesonide  0.25 mg Nebulization Q6H  . carvedilol  12.5 mg Per NG tube BID  . chlorhexidine  15 mL Mouth Rinse BID  . free water  200 mL Per Tube Q6H  . furosemide  40 mg Intravenous BID  . heparin subcutaneous  5,000 Units Subcutaneous 3 times per day  . insulin aspart  0-15 Units Subcutaneous 6 times per day  . ipratropium-albuterol  3 mL Nebulization QID  . LORazepam  1 mg Intravenous Once  . pantoprazole sodium  40 mg Per Tube Daily   Continuous Infusions: . amiodarone 30 mg/hr (10/18/14 0828)  . feeding supplement (VITAL AF 1.2 CAL) 1,000 mL (10/17/14 1900)   PRN Meds:.sodium chloride, fentaNYL (SUBLIMAZE) injection,  labetalol, LORazepam, sodium chloride   Results for orders placed or performed during the hospital encounter of 10/24/2014 (from the past 48 hour(s))  Glucose, capillary     Status: Abnormal   Collection Time: 10/16/14  4:11 PM  Result Value Ref Range   Glucose-Capillary 121 (H) 65 - 99 mg/dL  Basic metabolic panel     Status: Abnormal   Collection Time: 10/16/14  4:20 PM  Result Value Ref Range   Sodium 146 (H) 135 - 145 mmol/L   Potassium 3.8 3.5 - 5.1 mmol/L   Chloride 113 (H) 101 - 111 mmol/L   CO2 27 22 - 32 mmol/L   Glucose, Bld 120 (H) 65 - 99 mg/dL   BUN 56 (H) 6 - 20 mg/dL   Creatinine, Ser 1.39 (H) 0.61 - 1.24 mg/dL   Calcium 7.9 (L)  8.9 - 10.3 mg/dL   GFR calc non Af Amer 46 (L) >60 mL/min   GFR calc Af Amer 53 (L) >60 mL/min    Comment: (NOTE) The eGFR has been calculated using the CKD EPI equation. This calculation has not been validated in all clinical situations. eGFR's persistently <60 mL/min signify possible Chronic Kidney Disease.    Anion gap 6 5 - 15  Clostridium Difficile by PCR (not at [facility])     Status: None   Collection Time: 10/16/14  5:34 PM  Result Value Ref Range   C difficile by pcr NEGATIVE NEGATIVE  Glucose, capillary     Status: Abnormal   Collection Time: 10/16/14  7:29 PM  Result Value Ref Range   Glucose-Capillary 128 (H) 65 - 99 mg/dL  Glucose, capillary     Status: Abnormal   Collection Time: 10/17/14 12:21 AM  Result Value Ref Range   Glucose-Capillary 115 (H) 65 - 99 mg/dL  Basic metabolic panel     Status: Abnormal   Collection Time: 10/17/14  2:38 AM  Result Value Ref Range   Sodium 144 135 - 145 mmol/L   Potassium 3.5 3.5 - 5.1 mmol/L   Chloride 110 101 - 111 mmol/L   CO2 27 22 - 32 mmol/L   Glucose, Bld 130 (H) 65 - 99 mg/dL   BUN 52 (H) 6 - 20 mg/dL   Creatinine, Ser 1.31 (H) 0.61 - 1.24 mg/dL   Calcium 7.9 (L) 8.9 - 10.3 mg/dL   GFR calc non Af Amer 45 (L) >60 mL/min   GFR calc Af Amer 52 (L) >60 mL/min    Comment:  (NOTE) The eGFR has been calculated using the CKD EPI equation. This calculation has not been validated in all clinical situations. eGFR's persistently <60 mL/min signify possible Chronic Kidney Disease.    Anion gap 7 5 - 15  CBC     Status: Abnormal   Collection Time: 10/17/14  2:38 AM  Result Value Ref Range   WBC 13.4 (H) 4.0 - 10.5 K/uL   RBC 3.01 (L) 4.22 - 5.81 MIL/uL   Hemoglobin 10.0 (L) 13.0 - 17.0 g/dL   HCT 96.8 (L) 33.8 - 95.1 %   MCV 105.3 (H) 78.0 - 100.0 fL   MCH 33.2 26.0 - 34.0 pg   MCHC 31.5 30.0 - 36.0 g/dL   RDW 23.2 26.5 - 77.6 %   Platelets 256 150 - 400 K/uL  Phosphorus     Status: None   Collection Time: 10/17/14  2:38 AM  Result Value Ref Range   Phosphorus 2.9 2.5 - 4.6 mg/dL  Glucose, capillary     Status: Abnormal   Collection Time: 10/17/14  4:11 AM  Result Value Ref Range   Glucose-Capillary 125 (H) 65 - 99 mg/dL  Glucose, capillary     Status: Abnormal   Collection Time: 10/17/14  8:35 AM  Result Value Ref Range   Glucose-Capillary 122 (H) 65 - 99 mg/dL  Glucose, capillary     Status: Abnormal   Collection Time: 10/17/14 12:26 PM  Result Value Ref Range   Glucose-Capillary 139 (H) 65 - 99 mg/dL  Basic metabolic panel     Status: Abnormal   Collection Time: 10/17/14  2:04 PM  Result Value Ref Range   Sodium 146 (H) 135 - 145 mmol/L   Potassium 4.0 3.5 - 5.1 mmol/L   Chloride 114 (H) 101 - 111 mmol/L   CO2 28 22 - 32 mmol/L  Glucose, Bld 132 (H) 65 - 99 mg/dL   BUN 49 (H) 6 - 20 mg/dL   Creatinine, Ser 1.25 (H) 0.61 - 1.24 mg/dL   Calcium 7.9 (L) 8.9 - 10.3 mg/dL   GFR calc non Af Amer 52 (L) >60 mL/min   GFR calc Af Amer >60 >60 mL/min    Comment: (NOTE) The eGFR has been calculated using the CKD EPI equation. This calculation has not been validated in all clinical situations. eGFR's persistently <60 mL/min signify possible Chronic Kidney Disease.    Anion gap 4 (L) 5 - 15  Glucose, capillary     Status: Abnormal   Collection  Time: 10/17/14  3:21 PM  Result Value Ref Range   Glucose-Capillary 116 (H) 65 - 99 mg/dL  Basic metabolic panel     Status: Abnormal   Collection Time: 10/17/14  7:00 PM  Result Value Ref Range   Sodium 144 135 - 145 mmol/L   Potassium 3.5 3.5 - 5.1 mmol/L   Chloride 107 101 - 111 mmol/L   CO2 27 22 - 32 mmol/L   Glucose, Bld 142 (H) 65 - 99 mg/dL   BUN 46 (H) 6 - 20 mg/dL   Creatinine, Ser 1.37 (H) 0.61 - 1.24 mg/dL   Calcium 8.2 (L) 8.9 - 10.3 mg/dL   GFR calc non Af Amer 46 (L) >60 mL/min   GFR calc Af Amer 54 (L) >60 mL/min    Comment: (NOTE) The eGFR has been calculated using the CKD EPI equation. This calculation has not been validated in all clinical situations. eGFR's persistently <60 mL/min signify possible Chronic Kidney Disease.    Anion gap 10 5 - 15  CBC     Status: Abnormal   Collection Time: 10/17/14  7:00 PM  Result Value Ref Range   WBC 9.9 4.0 - 10.5 K/uL   RBC 3.34 (L) 4.22 - 5.81 MIL/uL   Hemoglobin 10.5 (L) 13.0 - 17.0 g/dL   HCT 34.8 (L) 39.0 - 52.0 %   MCV 104.2 (H) 78.0 - 100.0 fL   MCH 31.4 26.0 - 34.0 pg   MCHC 30.2 30.0 - 36.0 g/dL   RDW 14.9 11.5 - 15.5 %   Platelets 279 150 - 400 K/uL  Troponin I     Status: Abnormal   Collection Time: 10/17/14  7:00 PM  Result Value Ref Range   Troponin I 0.45 (H) <0.031 ng/mL    Comment:        PERSISTENTLY INCREASED TROPONIN VALUES IN THE RANGE OF 0.04-0.49 ng/mL CAN BE SEEN IN:       -UNSTABLE ANGINA       -CONGESTIVE HEART FAILURE       -MYOCARDITIS       -CHEST TRAUMA       -ARRYHTHMIAS       -LATE PRESENTING MYOCARDIAL INFARCTION       -COPD   CLINICAL FOLLOW-UP RECOMMENDED.   Glucose, capillary     Status: Abnormal   Collection Time: 10/17/14  8:35 PM  Result Value Ref Range   Glucose-Capillary 122 (H) 65 - 99 mg/dL  Glucose, capillary     Status: Abnormal   Collection Time: 10/17/14 11:30 PM  Result Value Ref Range   Glucose-Capillary 144 (H) 65 - 99 mg/dL  CBC     Status: Abnormal    Collection Time: 10/18/14  2:48 AM  Result Value Ref Range   WBC 11.5 (H) 4.0 - 10.5 K/uL   RBC 2.87 (L)  4.22 - 5.81 MIL/uL   Hemoglobin 9.0 (L) 13.0 - 17.0 g/dL   HCT 29.6 (L) 39.0 - 52.0 %   MCV 103.1 (H) 78.0 - 100.0 fL   MCH 31.4 26.0 - 34.0 pg   MCHC 30.4 30.0 - 36.0 g/dL   RDW 14.8 11.5 - 15.5 %   Platelets 220 150 - 400 K/uL  Magnesium     Status: None   Collection Time: 10/18/14  2:48 AM  Result Value Ref Range   Magnesium 2.0 1.7 - 2.4 mg/dL  Renal function panel     Status: Abnormal   Collection Time: 10/18/14  2:48 AM  Result Value Ref Range   Sodium 143 135 - 145 mmol/L   Potassium 3.7 3.5 - 5.1 mmol/L   Chloride 108 101 - 111 mmol/L   CO2 29 22 - 32 mmol/L   Glucose, Bld 112 (H) 65 - 99 mg/dL   BUN 50 (H) 6 - 20 mg/dL   Creatinine, Ser 1.45 (H) 0.61 - 1.24 mg/dL   Calcium 7.8 (L) 8.9 - 10.3 mg/dL   Phosphorus 3.6 2.5 - 4.6 mg/dL   Albumin 1.8 (L) 3.5 - 5.0 g/dL   GFR calc non Af Amer 43 (L) >60 mL/min   GFR calc Af Amer 50 (L) >60 mL/min    Comment: (NOTE) The eGFR has been calculated using the CKD EPI equation. This calculation has not been validated in all clinical situations. eGFR's persistently <60 mL/min signify possible Chronic Kidney Disease.    Anion gap 6 5 - 15  Glucose, capillary     Status: Abnormal   Collection Time: 10/18/14  5:00 AM  Result Value Ref Range   Glucose-Capillary 105 (H) 65 - 99 mg/dL  Glucose, capillary     Status: Abnormal   Collection Time: 10/18/14  7:22 AM  Result Value Ref Range   Glucose-Capillary 110 (H) 65 - 99 mg/dL  Troponin I     Status: Abnormal   Collection Time: 10/18/14  9:30 AM  Result Value Ref Range   Troponin I 0.40 (H) <0.031 ng/mL    Comment:        PERSISTENTLY INCREASED TROPONIN VALUES IN THE RANGE OF 0.04-0.49 ng/mL CAN BE SEEN IN:       -UNSTABLE ANGINA       -CONGESTIVE HEART FAILURE       -MYOCARDITIS       -CHEST TRAUMA       -ARRYHTHMIAS       -LATE PRESENTING MYOCARDIAL INFARCTION        -COPD   CLINICAL FOLLOW-UP RECOMMENDED.   Phosphorus     Status: None   Collection Time: 10/18/14  9:30 AM  Result Value Ref Range   Phosphorus 3.0 2.5 - 4.6 mg/dL  Glucose, capillary     Status: Abnormal   Collection Time: 10/18/14 12:52 PM  Result Value Ref Range   Glucose-Capillary 117 (H) 65 - 99 mg/dL   Dg Chest Port 1 View  10/18/2014   CLINICAL DATA:  Hypoxia  EXAM: PORTABLE CHEST - 1 VIEW  COMPARISON:  October 14, 2014 pain October 13, 2014  FINDINGS: Endotracheal tube tip is 4.2 cm above the carina. Nasogastric tube tip and side port are in the stomach. Pacemaker leads are attached to the right atrium, right ventricle, and left ventricle, stable. No pneumothorax. On the current examination, the pacemaker is obscuring the nodular lesion seen recently in the left upper lobe. There is underlying emphysematous change. There is underlying  interstitial and patchy alveolar edema. Heart is mildly enlarged with a degree of pulmonary venous hypertension. No adenopathy apparent.  IMPRESSION: Tube positions as described without pneumothorax. Evidence of congestive heart failure superimposed on emphysematous change. Nodular lesion left upper lobe currently obscured by pacemaker. Based on recent radiographs, recommendation for noncontrast enhanced chest CT to further evaluate this nodular lesion remains in effect.   Electronically Signed   By: Lowella Grip III M.D.   On: 10/18/2014 07:14    ROS:    Unable to obtain from patient as he is intubated. He does deny any chest pain now or as part of illness that brought him to the ED. Per admit note pt had a one day history of not feeling well and dyspnea.  CV:see HPI PUL:see HPI   Blood pressure 130/80, pulse 80, temperature 98.6 F (37 C), temperature source Oral, resp. rate 25, height $RemoveBe'5\' 11"'oZOJtMoDi$  (1.803 m), weight 190 lb 0.6 oz (86.2 kg), SpO2 99 %.  Wt Readings from Last 3 Encounters:  10/18/14 190 lb 0.6 oz (86.2 kg)  09/09/14 187 lb 9.6 oz (85.095  kg)  06/14/14 183 lb 3.2 oz (83.099 kg)    PE:  General: Calm, intubated, appropriately responsive to simple yes/no questiosns, NAD Skin:Warm and dry, brisk capillary refill HEENT:normocephalic, sclera clear, mucus membranes moist Neck:supple, no JVD, no bruits  Heart:S1S2 with occ irregular beat, without murmur, gallup, rub or click Lungs:clear without rales, rhonchi, or wheezes anteriorly JPE:TKKO, non tender, + BS Ext: 1+ lower ext and left arm edema, 2+ pedal pulses, 2+ radial pulses Neuro:alert and oriented as much as can be assessed, follows commands   Assessment/Plan Active Problems:   S/P biventricular cardiac pacemaker-[employer]tronic   Acute on chronic systolic CHF (congestive heart failure)   CKD (chronic kidney disease) stage 3, GFR 30-59 ml/min   Acute respiratory failure   Acute respiratory failure with hypercapnia   Acute kidney injury     #1  Ischemic cardiomyopathy with v fib arrest this admit. Defibrillated after 3 minutes. Amiodarone IV drip. K+ 3.5 at arrest with Mg+ of 2.0. Post arrest troponins X2 mildly elevated but flat.  Pacer interrogation with 3 min v fib and shock.  Now AV pacing and sensing appropriately. optivol shows increase of fluid today.   Hx of VT in past and did not tolerate amiodarone due to elevated LFTs.    #2 acute on chronic heart failure. Historically EF has been as low as 15% in 2012. Echo 2015 with EF 30-35%/  On admit day 10/09/2014 echo showed Inferior wall hypokinesis, abnormal septal motion, moderate LVH and EF 45-50%. IV diuresis per critical care. On lasix 40 mg BID for today so far -118 and overall since admit +8,932.   Wt in March 2016 of 187 and 190 now, with pk wt this admit 194.   Increase lasix- MD to see and eval.  BP stable.   #3 Acute respiratory failure/CAP. Intubated and managed by critical care.  #4 CAD with hx multiple RCA stents now mild increase of troponin post arrest with CPR, due to demand ischemia, doubt MI.  No chest  pain.    #5  Pt with CRT-P device.  implanted 2013.     Daune Perch  Nurse Practitioner Karen Kays, [facility] Documentation and patient exam completed in the presence of and supervised by Cecilie Kicks, FNP  I have seen the pt along with Ms. Hammond, NP-Student and have examined the pt.independently.  We discussed exam and plan.  I  agree with orders and plan.    Cecilie Kicks, FNP-C [facility].     [place] Pager [phone] or after 5pm or weekends call [phone] 10/18/2014, 2:08 PM    Attending Note:   The patient was seen and examined.  Agree with assessment and plan as noted above.  Changes made to the above note as needed.  1. Ventricular fibrillation :  Troponins do not suggest ACS as the etiology. Possibly due to worsening CHF and strain in LV Agree with diuresis.    Continue amio but may not bee a good long term solution as he did not tolerate it previously. Will ask Dr. Caryl Comes to comment on alternative therapies  2. Acute on chronic systolic CHF:  Agree with increasing diuretics.  He is + 9 liters through the hospitalization .  Consider a lasix drip if the Q 8 dosing does not work   Currently stable, awake , on the vent   Thayer Headings, Brooke Bonito., MD, [facility] 10/18/2014, 3:01 PM [address]  [place] Pager [phone]

## 2014-10-18 NOTE — Progress Notes (Signed)
PULMONARY / CRITICAL CARE MEDICINE   Name: Andrew [name] MRN: [id number] DOB: [birth year]    ADMISSION DATE:  10/20/2014  INITIAL PRESENTATION: 81 year old male [facility]07 with fever, SOB, hypoxemia, pulmonary infiltrates, respiratory acidosis. Failed BiPAP and intubated. PCCM admitted to ICU. PMH:  COPD, CHF, AF, CAD, prior MI  MAJOR EVENTS/TEST RESULTS: 7/07 TTE: Inferior wall hypokinesis. Abnormal septal motion The cavity size was normal. Wall thickness was increased in a pattern of moderate LVH. Systolic function was mildly reduced. The estimated ejection fraction was in the range of 45% to 50%. The left atrium was mildly dilated 7/15: Code Blue called; HR ~250, pulseless. Lasted ~4810min. Required 1 defibrillation, amio, epi.   INDWELLING DEVICES:: ETT 7/07 >>   MICRO DATA: Urine 7/07 >> NEG Resp 7/07 >> NOF Blood 7/07 >> 1/2 gram variable rod >> Bacillus species (unknown significance when isolation from only 1 vial).  ANTIMICROBIALS:  Vancomycin 7/07 >> 7/08 Azithro 7/07 >> 7/09 Ceftriaxone 7/07 >> 7/13  SUBJECTIVE:  Patient stable this AM. A Code Blue was called last night. defibrillation was needed x1. Patient is currently conscious and responding to yes/no questions appropriately.  VITAL SIGNS: Temp:  [97.8 F (36.6 C)-98.9 F (37.2 C)] 98.6 F (37 C) (07/15 0715) Pulse Rate:  [31-157] 80 (07/15 1115) Resp:  [16-31] 25 (07/15 1115) BP: (85-171)/(35-130) 130/80 mmHg (07/15 1115) SpO2:  [87 %-100 %] 99 % (07/15 0715) FiO2 (%):  [30 %-40 %] 40 % (07/15 1300) Weight:  [190 lb 0.6 oz (86.2 kg)] 190 lb 0.6 oz (86.2 kg) (07/15 0500) HEMODYNAMICS:   VENTILATOR SETTINGS: Vent Mode:  [-] PRVC FiO2 (%):  [30 %-40 %] 40 % Set Rate:  [14 bmp] 14 bmp Vt Set:  [600 mL] 600 mL PEEP:  [5 cmH20] 5 cmH20 Plateau Pressure:  [10 cmH20-25 cmH20] 24 cmH20 INTAKE / OUTPUT:  Intake/Output Summary (Last 24 hours) at 10/18/14 1432 Last data filed at 10/18/14 0700  Gross  per 24 hour  Intake 1399.18 ml  Output   1550 ml  Net -150.82 ml    PHYSICAL EXAMINATION: General: NAD, awake, alert.  Neuro: no focal deficits, RASS 0 HEENT:  [place]/AT, MMM, ETT in place Cardiovascular:  RRR, no murmur Lungs: bibasilar crackles noted, no wheezes Abdomen:  Soft, NT, +BS Ext: no edema, pulses present bilaterally  LABS:  CBC  Recent Labs Lab 10/17/14 0238 10/17/14 1900 10/18/14 0248  WBC 13.4* 9.9 11.5*  HGB 10.0* 10.5* 9.0*  HCT 31.7* 34.8* 29.6*  PLT 256 279 220   Coag's No results for input(s): APTT, INR in the last 168 hours. BMET  Recent Labs Lab 10/17/14 1404 10/17/14 1900 10/18/14 0248  NA 146* 144 143  K 4.0 3.5 3.7  CL 114* 107 108  CO2 28 27 29   BUN 49* 46* 50*  CREATININE 1.25* 1.37* 1.45*  GLUCOSE 132* 142* 112*   Electrolytes  Recent Labs Lab 10/16/14 0227  10/17/14 0238 10/17/14 1404 10/17/14 1900 10/18/14 0248 10/18/14 0930  CALCIUM 8.1*  < > 7.9* 7.9* 8.2* 7.8*  --   MG 2.4  --   --   --   --  2.0  --   PHOS 1.8*  --  2.9  --   --  3.6 3.0  < > = values in this interval not displayed. Sepsis Markers  Recent Labs Lab 10/12/14 0200  PROCALCITON 2.14   ABG No results for input(s): PHART, PCO2ART, PO2ART in the last 168 hours.  Liver Enzymes  Recent Labs Lab 10/16/14 0227 10/18/14 0248  ALBUMIN 1.8* 1.8*   Cardiac Enzymes  Recent Labs Lab 10/17/14 1900 10/18/14 0930  TROPONINI 0.45* 0.40*   Glucose  Recent Labs Lab 10/17/14 1521 10/17/14 2035 10/17/14 2330 10/18/14 0500 10/18/14 0722 10/18/14 1252  GLUCAP 116* 122* 144* 105* 110* 117*    CXR: diffuse interstitial prominence, LUL nodular density   ASSESSMENT / PLAN: PULMONARY ETT 7/7 >>   - hope to extubate when adequate correction of fluid overload established A: Acute on chronic respiratory failure COPD - likely severe @ baseline Possible PNA - s/p full abx course LUL density P:   Cont vent support - PEEP 5; 40% Cont vent  bundle CXR 7/15: worsening CHF and emphysema SBT Cont nebulized steroids/BDs Consider eval of possible lung nodule as necessary  CARDIOVASCULAR A:  H/o CAD (MI 1994), AFib, HTN, HLD Pacemaker in-situ Mild ischemic CM - LVEF 45-50% Positive troponins; likely 2/2 to demand P:  Cont monitoring Coreg 12.5mg  Amiodarone drip Labetalol PRN for tachy IV Lasix as below Holding ARB   Echo ordered Cardiology consulted, will see  RENAL A:   AKI, nonoliguric - improving/resolved CKD III - baseline Cr 1.61 Hypernatremia Hypophosphatemia - resolved P:   Monitor BMP Cr (1.45) IV Lasix  BID Monitor I/Os - +9L since admission  - Neg goal: - yesterday Cont correcting electrolytes as indicated  GASTROINTESTINAL A:   GERD, chronic PPI Diarrhea; CDiff neg P:   SUP: enteral pantoprazole Cont TFs  - May be cause for diarrhea Reglan for nausea  HEMATOLOGIC A:   Chronic anticoagulation (AF) Mild anemia without acute blood loss P:  Hold home Xarelto Restart SQ hep 7/13 Tranfuse per ICU guidelines  INFECTIOUS A:   CAP, NOS - abx course completed Severe Sepsis, resolved P:   Micro as above CTX 7/8>>7/13  ENDOCRINE A:   Hyperglycemia - controlled P:   Cont CBG monitoring and SSI  NEUROLOGIC A:   Acute metabolic encephalopathy P:   RASS goal: -1 Precedex drip PRN fentanyl Daily WUA  FAMILY  - Updates: Discussed treatment plan and criteria for extubation w/ daughter at bedside 7/14 - Inter-disciplinary family meet or Palliative Care meeting due by:  10/16/2014    Kathee Delton, MD,MS,  PGY2 10/18/2014 2:32 PM

## 2014-10-19 ENCOUNTER — Inpatient Hospital Stay (HOSPITAL_COMMUNITY): Payer: Medicare Other

## 2014-10-19 DIAGNOSIS — Z950 Presence of cardiac pacemaker: Secondary | ICD-10-CM

## 2014-10-19 DIAGNOSIS — I469 Cardiac arrest, cause unspecified: Secondary | ICD-10-CM

## 2014-10-19 DIAGNOSIS — I4901 Ventricular fibrillation: Secondary | ICD-10-CM

## 2014-10-19 DIAGNOSIS — J9601 Acute respiratory failure with hypoxia: Secondary | ICD-10-CM

## 2014-10-19 DIAGNOSIS — I255 Ischemic cardiomyopathy: Secondary | ICD-10-CM

## 2014-10-19 DIAGNOSIS — J9602 Acute respiratory failure with hypercapnia: Secondary | ICD-10-CM

## 2014-10-19 LAB — COMPREHENSIVE METABOLIC PANEL
ALT: 39 U/L (ref 17–63)
AST: 27 U/L (ref 15–41)
Albumin: 1.9 g/dL — ABNORMAL LOW (ref 3.5–5.0)
Alkaline Phosphatase: 60 U/L (ref 38–126)
Anion gap: 6 (ref 5–15)
BUN: 49 mg/dL — ABNORMAL HIGH (ref 6–20)
CO2: 29 mmol/L (ref 22–32)
Calcium: 7.9 mg/dL — ABNORMAL LOW (ref 8.9–10.3)
Chloride: 109 mmol/L (ref 101–111)
Creatinine, Ser: 1.4 mg/dL — ABNORMAL HIGH (ref 0.61–1.24)
GFR calc Af Amer: 52 mL/min — ABNORMAL LOW (ref >60)
GFR calc non Af Amer: 45 mL/min — ABNORMAL LOW (ref >60)
Glucose, Bld: 120 mg/dL — ABNORMAL HIGH (ref 65–99)
Potassium: 3.7 mmol/L (ref 3.5–5.1)
Sodium: 144 mmol/L (ref 135–145)
Total Bilirubin: 0.3 mg/dL (ref 0.3–1.2)
Total Protein: 5.9 g/dL — ABNORMAL LOW (ref 6.5–8.1)

## 2014-10-19 LAB — GLUCOSE, CAPILLARY
GLUCOSE-CAPILLARY: 124 mg/dL — AB (ref 65–99)
GLUCOSE-CAPILLARY: 112 mg/dL — AB (ref 65–99)
GLUCOSE-CAPILLARY: 126 mg/dL — AB (ref 65–99)
GLUCOSE-CAPILLARY: 128 mg/dL — AB (ref 65–99)
Glucose-Capillary: 119 mg/dL — ABNORMAL HIGH (ref 65–99)

## 2014-10-19 LAB — CBC
HCT: 29.8 % — ABNORMAL LOW (ref 39.0–52.0)
Hemoglobin: 9.1 g/dL — ABNORMAL LOW (ref 13.0–17.0)
MCH: 31.2 pg (ref 26.0–34.0)
MCHC: 30.5 g/dL (ref 30.0–36.0)
MCV: 102.1 fL — ABNORMAL HIGH (ref 78.0–100.0)
Platelets: 219 10*3/uL (ref 150–400)
RBC: 2.92 MIL/uL — AB (ref 4.22–5.81)
RDW: 14.6 % (ref 11.5–15.5)
WBC: 8.6 10*3/uL (ref 4.0–10.5)

## 2014-10-19 LAB — MAGNESIUM: MAGNESIUM: 2.1 mg/dL (ref 1.7–2.4)

## 2014-10-19 LAB — PHOSPHORUS: Phosphorus: 3.2 mg/dL (ref 2.5–4.6)

## 2014-10-19 MED ORDER — BUDESONIDE 0.25 MG/2ML IN SUSP
0.2500 mg | Freq: Two times a day (BID) | RESPIRATORY_TRACT | Status: DC
  Administered 2014-10-20: 0.25 mg via RESPIRATORY_TRACT
  Filled 2014-10-19 (×5): qty 2

## 2014-10-19 MED ORDER — POTASSIUM CHLORIDE 20 MEQ/15ML (10%) PO SOLN
20.0000 meq | ORAL | Status: AC
  Administered 2014-10-19 (×2): 20 meq
  Filled 2014-10-19 (×2): qty 15

## 2014-10-19 MED ORDER — IPRATROPIUM-ALBUTEROL 0.5-2.5 (3) MG/3ML IN SOLN
3.0000 mL | Freq: Four times a day (QID) | RESPIRATORY_TRACT | Status: DC
  Administered 2014-10-19 – 2014-10-20 (×4): 3 mL via RESPIRATORY_TRACT
  Filled 2014-10-19 (×6): qty 3

## 2014-10-19 NOTE — Progress Notes (Signed)
[facility] FOR [facility] REPLACEMENT ONLY  The patient does [facility] based on the criteria listed below:   1. Is GFR >/= 40 ml/min? Yes.    Patient's GFR today is 45 2. Is urine output >/= 0.5 ml/kg/hr for the last 6 hours? Yes.   Patient's UOP is 1.3 ml/kg/hr 3. Is BUN < 60 mg/dL? Yes.    Patient's BUN today is 49 4. Abnormal electrolyte(s):potassium 5. Ordered repletion with: Potassium  Per protocol  Clark Cuff P 10/19/2014 4:11 AM

## 2014-10-19 NOTE — Progress Notes (Signed)
PULMONARY / CRITICAL CARE MEDICINE   Name: Andrew [name] MRN: [id number] DOB: 21-Aug-1930    ADMISSION DATE:  10/31/2014  INITIAL PRESENTATION: 81 [facility] with fever, SOB, hypoxemia, pulmonary infiltrates, respiratory acidosis. Failed BiPAP and intubated. PCCM admitted to ICU. PMH:  COPD, CHF, AF, CAD, prior MI  MAJOR EVENTS/TEST RESULTS: 7/07 TTE: Inferior wall hypokinesis. Abnormal septal motion The cavity size was normal. Wall thickness was increased in a pattern of moderate LVH. Systolic function was mildly reduced. The estimated ejection fraction was in the range of 45% to 50%. The left atrium was mildly dilated 7/15: Code Blue called; HR ~250, pulseless. Lasted ~7210min. Required 1 defibrillation, amio, epi.   INDWELLING DEVICES:: ETT 7/07 >>   MICRO DATA: Urine 7/07 >> NEG Resp 7/07 >> NOF Blood 7/07 >> 1/2 gram variable rod >> Bacillus species (unknown significance when isolation from only 1 vial).  ANTIMICROBIALS:  Vancomycin 7/07 >> 7/08 Azithro 7/07 >> 7/09 Ceftriaxone 7/07 >> 7/13  SUBJECTIVE:  Patient awake and alert today. Very interactive. Communicated via pen/paper. Is very frustrated w/ continued intubation.  VITAL SIGNS: Temp:  [97.2 F (36.2 C)-98.6 F (37 C)] 97.2 F (36.2 C) (07/16 0829) Pulse Rate:  [61-80] 74 (07/16 0800) Resp:  [15-27] 22 (07/16 0800) BP: (89-141)/(41-111) 106/55 mmHg (07/16 0800) SpO2:  [98 %-100 %] 100 % (07/16 0857) FiO2 (%):  [30 %-40 %] 40 % (07/16 0916) Weight:  [192 lb 3.9 oz (87.2 kg)] 192 lb 3.9 oz (87.2 kg) (07/16 0329) HEMODYNAMICS:   VENTILATOR SETTINGS: Vent Mode:  [-] PRVC FiO2 (%):  [30 %-40 %] 40 % Set Rate:  [14 bmp] 14 bmp Vt Set:  [600 mL] 600 mL PEEP:  [5 cmH20] 5 cmH20 Pressure Support:  [8 cmH20] 8 cmH20 Plateau Pressure:  [18 cmH20-24 cmH20] 18 cmH20 INTAKE / OUTPUT:  Intake/Output Summary (Last 24 hours) at 10/19/14 0942 Last data filed at 10/19/14 0800  Gross per 24  hour  Intake 2004.44 ml  Output   1200 ml  Net 804.44 ml    PHYSICAL EXAMINATION: General: NAD, awake, alert Neuro: no focal deficits, RASS 0 HEENT:  [place]/AT, MMM, ETT in place Cardiovascular:  RRR, no murmur Lungs: bibasilar crackles noted (improved), no wheezes Abdomen:  Soft, NT, +BS Ext: no edema, pulses present bilaterally  LABS:  CBC  Recent Labs Lab 10/17/14 1900 10/18/14 0248 10/19/14 0222  WBC 9.9 11.5* 8.6  HGB 10.5* 9.0* 9.1*  HCT 34.8* 29.6* 29.8*  PLT 279 220 219   Coag's No results for input(s): APTT, INR in the last 168 hours. BMET  Recent Labs Lab 10/17/14 1900 10/18/14 0248 10/19/14 0222  NA 144 143 144  K 3.5 3.7 3.7  CL 107 108 109  CO2 27 29 29   BUN 46* 50* 49*  CREATININE 1.37* 1.45* 1.40*  GLUCOSE 142* 112* 120*   Electrolytes  Recent Labs Lab 10/16/14 0227  10/17/14 1900 10/18/14 0248 10/18/14 0930 10/19/14 0222  CALCIUM 8.1*  < > 8.2* 7.8*  --  7.9*  MG 2.4  --   --  2.0  --  2.1  PHOS 1.8*  < >  --  3.6 3.0 3.2  < > = values in this interval not displayed. Sepsis Markers No results for input(s): LATICACIDVEN, PROCALCITON, O2SATVEN in the last 168 hours. ABG No results for input(s): PHART, PCO2ART, PO2ART in the last 168 hours. Liver Enzymes  Recent Labs Lab 10/16/14 0227 10/18/14 0248 10/19/14 0222  AST  --   --  27  ALT  --   --  39  ALKPHOS  --   --  60  BILITOT  --   --  0.3  ALBUMIN 1.8* 1.8* 1.9*   Cardiac Enzymes  Recent Labs Lab 10/17/14 1900 10/18/14 0930  TROPONINI 0.45* 0.40*   Glucose  Recent Labs Lab 10/18/14 1252 10/18/14 1629 10/18/14 1939 10/18/14 2346 10/19/14 0325 10/19/14 0827  GLUCAP 117* 116* 121* 100* 124* 112*    CXR: diffuse interstitial prominence, LUL nodular density   ASSESSMENT / PLAN: PULMONARY ETT 7/7 >>   - extubate when adequate correction of fluid overload established A: Acute on chronic respiratory failure COPD - likely severe @ baseline Possible PNA - s/p  full abx course LUL density P:   Cont vent support - PEEP 5; 40% Cont vent bundle CXR 7/15: worsening CHF and emphysema SBT Cont nebulized steroids/BDs Consider eval of possible lung nodule as necessary  CARDIOVASCULAR A:  H/o CAD (MI 1994), AFib, HTN, HLD AoCHF, systolic Pacemaker in-situ Mild ischemic CM - LVEF 45-50% Positive troponins; likely 2/2 to demand P:  Cont monitoring Coreg 12.5mg  Amiodarone drip Labetalol PRN for tachy IV Lasix as below Holding ARB   Echo ordered Cardiology consulted, following  RENAL A:   AKI, nonoliguric - improving/resolved CKD III - baseline Cr 1.61 Hypernatremia Hypophosphatemia - resolved P:   Monitor BMP Cr (1.40) IV Lasix  TID Monitor I/Os - only 1L output recorded; likely a lapse in documentation.  - Negative goal Cont correcting electrolytes as indicated  GASTROINTESTINAL A:   GERD, chronic PPI Diarrhea; CDiff neg P:   SUP: enteral pantoprazole Cont TFs  - May be cause for diarrhea Reglan for nausea  HEMATOLOGIC A:   Chronic anticoagulation (AF) Mild anemia without acute blood loss P:  Hold home Xarelto Restart SQ hep 7/13 Tranfuse per ICU guidelines  INFECTIOUS A:   CAP, NOS - abx course completed Severe Sepsis, resolved P:   Micro as above CTX 7/8>>7/13  ENDOCRINE A:   Hyperglycemia - controlled P:   Cont CBG monitoring and SSI  NEUROLOGIC A:   Acute metabolic encephalopathy P:   RASS goal: -1 Precedex drip PRN fentanyl Daily WUA  FAMILY  - Updates: Discussed treatment plan and criteria for extubation w/ daughter at bedside 7/14 - Inter-disciplinary family meet or Palliative Care meeting due by:  10/16/2014    Kathee Delton, MD,MS,  PGY2 10/19/2014 9:42 AM    Reviewed above, examined.  He is tolerating pressure support.  Denies chest pain.  Alert, follows commands, heart rate irregular, no wheeze, abd soft.  Labs reviewed >> mild anemia, creatinine stable.  If he tolerates  pressure support will try to extubate.  He is DNR.  Continue amiodarone per cardiology.  Coralyn Helling, MD [facility] Pulmonary/Critical Care 10/19/2014, 2:14 PM Pager:  [phone] After 3pm call: [phone]

## 2014-10-19 NOTE — Progress Notes (Signed)
  Echocardiogram 2D Echocardiogram has been performed.  Delcie RochENNINGTON, Anea Fodera 10/19/2014, 6:05 PM

## 2014-10-19 NOTE — Progress Notes (Signed)
Terminated wean due to inc WOB.  RR 35.

## 2014-10-19 NOTE — Progress Notes (Addendum)
SUBJECTIVE:  Intubated and sedated  OBJECTIVE:   Vitals:   Filed Vitals:   10/19/14 0700 10/19/14 0800 10/19/14 0829 10/19/14 0857  BP: 123/53 106/55    Pulse: 69 74    Temp:   97.2 F (36.2 C)   TempSrc:   Oral   Resp: 19 22    Height:      Weight:      SpO2: 100% 100%  100%   I&O's:   Intake/Output Summary (Last 24 hours) at 10/19/14 1024 Last data filed at 10/19/14 0800  Gross per 24 hour  Intake 1922.74 ml  Output   1200 ml  Net 722.74 ml     PHYSICAL EXAM General: intubated  Lungs:   Clear bilaterally to auscultation anteriorly Heart:   HRRR S1 S2 Pulses are 2+ & equal. Abdomen: Bowel sounds are positive, abdomen soft and non-tender without masses Extremities:   No clubbing, cyanosis or edema.  DP +1     LABS: Basic Metabolic Panel:  Recent Labs  16/01/9606/15/16 0248 10/18/14 0930 10/19/14 0222  NA 143  --  144  K 3.7  --  3.7  CL 108  --  109  CO2 29  --  29  GLUCOSE 112*  --  120*  BUN 50*  --  49*  CREATININE 1.45*  --  1.40*  CALCIUM 7.8*  --  7.9*  MG 2.0  --  2.1  PHOS 3.6 3.0 3.2   Liver Function Tests:  Recent Labs  10/18/14 0248 10/19/14 0222  AST  --  27  ALT  --  39  ALKPHOS  --  60  BILITOT  --  0.3  PROT  --  5.9*  ALBUMIN 1.8* 1.9*   No results for input(s): LIPASE, AMYLASE in the last 72 hours. CBC:  Recent Labs  10/18/14 0248 10/19/14 0222  WBC 11.5* 8.6  HGB 9.0* 9.1*  HCT 29.6* 29.8*  MCV 103.1* 102.1*  PLT 220 219   Cardiac Enzymes:  Recent Labs  10/17/14 1900 10/18/14 0930  TROPONINI 0.45* 0.40*   BNP: Invalid input(s): POCBNP D-Dimer: No results for input(s): DDIMER in the last 72 hours. Hemoglobin A1C: No results for input(s): HGBA1C in the last 72 hours. Fasting Lipid Panel: No results for input(s): CHOL, HDL, LDLCALC, TRIG, CHOLHDL, LDLDIRECT in the last 72 hours. Thyroid Function Tests: No results for input(s): TSH, T4TOTAL, T3FREE, THYROIDAB in the last 72 hours.  Invalid input(s):  FREET3 Anemia Panel: No results for input(s): VITAMINB12, FOLATE, FERRITIN, TIBC, IRON, RETICCTPCT in the last 72 hours. Coag Panel:   Lab Results  Component Value Date   INR 2.28* 10/08/2014   INR 1.77* 05/03/2012   INR 1.68* 03/10/2012    RADIOLOGY: Dg Chest Port 1 View  10/18/2014   CLINICAL DATA:  Hypoxia  EXAM: PORTABLE CHEST - 1 VIEW  COMPARISON:  October 14, 2014 pain October 13, 2014  FINDINGS: Endotracheal tube tip is 4.2 cm above the carina. Nasogastric tube tip and side port are in the stomach. Pacemaker leads are attached to the right atrium, right ventricle, and left ventricle, stable. No pneumothorax. On the current examination, the pacemaker is obscuring the nodular lesion seen recently in the left upper lobe. There is underlying emphysematous change. There is underlying interstitial and patchy alveolar edema. Heart is mildly enlarged with a degree of pulmonary venous hypertension. No adenopathy apparent.  IMPRESSION: Tube positions as described without pneumothorax. Evidence of congestive heart failure superimposed on emphysematous change. Nodular lesion  left upper lobe currently obscured by pacemaker. Based on recent radiographs, recommendation for noncontrast enhanced chest CT to further evaluate this nodular lesion remains in effect.   Electronically Signed   By: Bretta Bang III M.D.   On: 10/18/2014 07:14   Dg Chest Port 1 View  10/14/2014   CLINICAL DATA:  Respiratory failure.  EXAM: PORTABLE CHEST - 1 VIEW  COMPARISON:  10/13/2014, 10/12/2014, 10/11/2014.  FINDINGS: Endotracheal tube in stable position. In advancement of NG tube, its tip is below left hemidiaphragm. Cardiac pacer in stable position. Stable cardiomegaly. Persistent rounded density in the left upper lobe. Nonenhanced chest CT is again recommended for further evaluation diffuse interstitial infiltrates are unchanged. No definite pleural effusion. No pneumothorax.  IMPRESSION: 1. Interim advancement of NG tube, its  tip is now below the left hemidiaphragm. Endotracheal tube in good anatomic position. 2. Persistent density in the left upper lobe. As previously noted nonenhanced chest CT is suggested for further evaluation as malignancy cannot be excluded. 3. Persistent diffuse bilateral interstitial prominence. 4. Cardiac pacer in stable position.  Stable cardiomegaly.   Electronically Signed   By: Maisie Fus  Register   On: 10/14/2014 07:27   Dg Chest Port 1 View  10/13/2014   CLINICAL DATA:  Acute respiratory failure with hypercapnia. COPD. Acute kidney injury. On ventilator.  EXAM: PORTABLE CHEST - 1 VIEW  COMPARISON:  10/12/2014  FINDINGS: Endotracheal tube remains in appropriate position. Nasogastric tube has been pulled back with tip now on the proximal thoracic esophagus.  Heart size remains within normal limits. Pacemaker remains in place. Changes of COPD again noted. Diffuse pulmonary interstitial infiltrates show no significant change. No pneumothorax or pleural effusion visualized. A persistent focal nodular opacity measuring approximately 2.5 cm is seen in the left upper lung field, and bronchogenic carcinoma cannot be excluded.  IMPRESSION: Persistent 2.5 cm left upper lung nodular opacity. Chest CT without contrast recommended to exclude bronchogenic carcinoma.  COPD.  Stable diffuse pulmonary interstitial infiltrates.  Nasogastric tube tip now seen in the proximal thoracic esophagus. These results were called by telephone at the time of interpretation on 10/13/2014 at 8:17 am to the patient's floor nurse LaTanya, who verbally acknowledged these results.   Electronically Signed   By: Myles Rosenthal M.D.   On: 10/13/2014 08:18   Dg Chest Port 1 View  10/12/2014   CLINICAL DATA:  Patient with history of respiratory failure.  EXAM: PORTABLE CHEST - 1 VIEW  COMPARISON:  Chest radiograph 10/11/2014  FINDINGS: Multi lead pacer apparatus overlies the left hemi thorax leads are stable in position. ET tube terminates in the  mid trachea. Enteric tube courses inferior to the diaphragm. Stable enlarged cardiac and mediastinal contours. Tortuosity of the thoracic aorta. Re- demonstrated focal masslike area of consolidation within the left upper lung. Biapical pleural parenchymal thickening. Right basilar heterogeneous opacities. Probable small right pleural effusion.  IMPRESSION: Focal masslike opacification within the left upper hemi thorax may represent infection, infarct or underlying mass. Continued radiographic follow-up or chest CT is recommended.  Small right pleural effusion with heterogeneous opacities right lung base potentially representing atelectasis.  ET tube in the mid trachea.  These results will be called to the ordering clinician or representative by the Radiologist Assistant, and communication documented in the PACS or zVision Dashboard.   Electronically Signed   By: Annia Belt M.D.   On: 10/12/2014 10:01   Dg Chest Port 1 View  10/11/2014   CLINICAL DATA:  Respiratory failure.  EXAM:  PORTABLE CHEST - 1 VIEW  COMPARISON:  10/05/2014.  11/10/2013.  03/16/2013.  FINDINGS: Endotracheal tube and NG tube in stable position. AICD in stable position. Stable cardiomegaly. Pulmonary vascularity normal. A density is noted in the periphery of the left upper lobe. This could represent a focal infiltrate, a mass lesion, or an infarct. Follow-up chest x-rays suggested demonstrate clearing. Interim clearing of pulmonary interstitial edema. No pleural effusion or pneumothorax.  IMPRESSION: 1. Lines and tubes in stable position. 2. Peripheral density in the left upper lung. This could represent a focal infiltrate/ pneumonia, a mass lesion, or a pulmonary infarct. 3. AICD in good anatomic position. Cardiomegaly with normal pulmonary vascularity. Interim clearing of pulmonary interstitial edema.   Electronically Signed   By: Maisie Fus  Register   On: 10/11/2014 07:29   Dg Chest Port 1 View  10/24/2014   CLINICAL DATA:  Emergent  intubation  EXAM: PORTABLE CHEST - 1 VIEW  COMPARISON:  10/29/2014 at 1:53 a.m.  FINDINGS: Interval intubation with endotracheal tube tip just below the clavicular heads.  Biventricular ICD/ pacer from the left, visible portions of the leads in stable position.  Stable mild cardiomegaly.  Negative aortic and hilar contours.  Unchanged reticular nodular lung opacities bilaterally. No pleural effusion or air leak.  IMPRESSION: 1. The new endotracheal tube is in good position. 2. Stable bilateral lung opacity which could reflect pneumonia or pulmonary edema (patchy in the setting of COPD).   Electronically Signed   By: Marnee Spring M.D.   On: 10/19/2014 05:54   Dg Chest Port 1 View  11/03/2014   CLINICAL DATA:  Acute onset of shortness of breath. Initial encounter.  EXAM: PORTABLE CHEST - 1 VIEW  COMPARISON:  Chest radiograph performed 05/13/2014  FINDINGS: The lungs are well-aerated. Vascular congestion is noted. Patchy bibasilar airspace opacities raise concern for pneumonia. An atypical infection cannot be excluded. Mild pulmonary edema cannot be excluded. There is no evidence of pleural effusion or pneumothorax.  The cardiomediastinal silhouette is borderline normal in size. A pacemaker is noted overlying the left chest wall, with leads ending overlying the right atrium, right ventricle and coronary sinus. No acute osseous abnormalities are seen.  IMPRESSION: Vascular congestion noted. Patchy bibasilar airspace opacities raise concern for pneumonia; an atypical infection could have such an appearance. Mild pulmonary edema cannot be excluded.   Electronically Signed   By: Roanna Raider M.D.   On: 10/18/2014 02:18   Dg Abd Portable 1v  10/13/2014   CLINICAL DATA:  Patient status post NG tube placement.  EXAM: PORTABLE ABDOMEN - 1 VIEW  COMPARISON:  Chest radiograph earlier same day.  FINDINGS: NG tube tip and side-port project over the stomach. Previously described left upper lung mass is incompletely  visualized. Heterogeneous opacities right lung base. Nonobstructed bowel gas pattern.  IMPRESSION: NG tube tip and side-port project over the stomach.   Electronically Signed   By: Annia Belt M.D.   On: 10/13/2014 10:42     Assessment/Plan:  Active Problems:  S/P biventricular cardiac pacemaker-[employer]  Acute on chronic systolic CHF (congestive heart failure)  CKD (chronic kidney disease) stage 3, GFR 30-59 ml/min  Acute respiratory failure  Acute respiratory failure with hypercapnia  Acute kidney injury   1. Ventricular fibrillation in setting of ischemic DCM: Defibrillated after 3 minutes. Amiodarone IV drip. K+ 3.5 at arrest with Mg+ of 2.0. Post arrest troponins X2 mildly elevated but  do not suggest ACS as the etiology. Pacer interrogation with 3 min v  fib and shock. Now AV pacing and sensing appropriately. optivol shows increase of fluid. Hx of VT in past and did not tolerate amiodarone due to elevated LFTs. Possibly due to worsening CHF and strain in LV. -Agree with diuresis.  -Continue amio but may not be a good long term solution as he did not tolerate it previously. -Will ask Dr. Ladona Ridgel to comment on alternative therapies -Continue BB -keep K+>4  2. Acute on chronic systolic CHF: Agree with increasing diuretics.  He is + 9 liters through the hospitalization .  Consider a lasix drip if the Q 8 dosing does not work   Quintella Reichert, MD  10/19/2014  10:24 AM

## 2014-10-20 LAB — CBC
HEMATOCRIT: 29.4 % — AB (ref 39.0–52.0)
HEMOGLOBIN: 9.2 g/dL — AB (ref 13.0–17.0)
MCH: 31.5 pg (ref 26.0–34.0)
MCHC: 31.3 g/dL (ref 30.0–36.0)
MCV: 100.7 fL — ABNORMAL HIGH (ref 78.0–100.0)
Platelets: 235 10*3/uL (ref 150–400)
RBC: 2.92 MIL/uL — AB (ref 4.22–5.81)
RDW: 14.4 % (ref 11.5–15.5)
WBC: 10.7 10*3/uL — ABNORMAL HIGH (ref 4.0–10.5)

## 2014-10-20 LAB — BASIC METABOLIC PANEL
Anion gap: 7 (ref 5–15)
BUN: 46 mg/dL — ABNORMAL HIGH (ref 6–20)
CHLORIDE: 105 mmol/L (ref 101–111)
CO2: 30 mmol/L (ref 22–32)
Calcium: 8 mg/dL — ABNORMAL LOW (ref 8.9–10.3)
Creatinine, Ser: 1.29 mg/dL — ABNORMAL HIGH (ref 0.61–1.24)
GFR calc non Af Amer: 50 mL/min — ABNORMAL LOW (ref >60)
GFR, EST AFRICAN AMERICAN: 58 mL/min — AB (ref >60)
Glucose, Bld: 124 mg/dL — ABNORMAL HIGH (ref 65–99)
Potassium: 3.8 mmol/L (ref 3.5–5.1)
Sodium: 142 mmol/L (ref 135–145)

## 2014-10-20 LAB — GLUCOSE, CAPILLARY
GLUCOSE-CAPILLARY: 113 mg/dL — AB (ref 65–99)
GLUCOSE-CAPILLARY: 114 mg/dL — AB (ref 65–99)
Glucose-Capillary: 114 mg/dL — ABNORMAL HIGH (ref 65–99)
Glucose-Capillary: 122 mg/dL — ABNORMAL HIGH (ref 65–99)
Glucose-Capillary: 151 mg/dL — ABNORMAL HIGH (ref 65–99)

## 2014-10-20 LAB — MAGNESIUM: MAGNESIUM: 1.9 mg/dL (ref 1.7–2.4)

## 2014-10-20 MED ORDER — DEXTROSE 5 % IV SOLN
10.0000 mg/h | INTRAVENOUS | Status: DC
  Administered 2014-10-20: 10 mg/h via INTRAVENOUS
  Filled 2014-10-20: qty 10

## 2014-10-20 MED ORDER — FENTANYL CITRATE (PF) 100 MCG/2ML IJ SOLN
50.0000 ug | INTRAMUSCULAR | Status: DC | PRN
  Administered 2014-10-20: 50 ug via INTRAVENOUS
  Filled 2014-10-20: qty 2

## 2014-10-20 MED ORDER — PANTOPRAZOLE SODIUM 40 MG IV SOLR
40.0000 mg | INTRAVENOUS | Status: DC
  Administered 2014-10-20: 40 mg via INTRAVENOUS
  Filled 2014-10-20: qty 40

## 2014-10-20 MED ORDER — MORPHINE BOLUS VIA INFUSION
5.0000 mg | INTRAVENOUS | Status: DC | PRN
  Administered 2014-10-20: 10 mg via INTRAVENOUS
  Filled 2014-10-20 (×2): qty 20

## 2014-10-20 MED ORDER — FREE WATER: 200.0000 mL | Freq: Three times a day (TID) | Status: DC

## 2014-10-20 NOTE — Progress Notes (Signed)
PULMONARY / CRITICAL CARE MEDICINE   Name: Andrew [name] MRN: [id number] DOB: July 06, 1930    ADMISSION DATE:  10/29/2014  INITIAL PRESENTATION: 81 [facility] with fever, SOB, hypoxemia, pulmonary infiltrates, respiratory acidosis. Failed BiPAP and intubated. PCCM admitted to ICU. PMH:  COPD, CHF, AF, CAD, prior MI  MAJOR EVENTS/TEST RESULTS: 7/07 TTE: Inferior wall hypokinesis. Abnormal septal motion The cavity size was normal. Wall thickness was increased in a pattern of moderate LVH. Systolic function was mildly reduced. The estimated ejection fraction was in the range of 45% to 50%. The left atrium was mildly dilated 7/15: Code Blue called; HR ~250, pulseless. Lasted ~5610min. Required 1 defibrillation, amio, epi.   INDWELLING DEVICES:: ETT 7/07 >>   MICRO DATA: Urine 7/07 >> NEG Resp 7/07 >> NOF Blood 7/07 >> 1/2 gram variable rod >> Bacillus species (unknown significance when isolation from only 1 vial).  ANTIMICROBIALS:  Vancomycin 7/07 >> 7/08 Azithro 7/07 >> 7/09 Ceftriaxone 7/07 >> 7/13  SUBJECTIVE:  Tolerating SBT.  VITAL SIGNS: Temp:  [98.6 F (37 C)-99.1 F (37.3 C)] 99.1 F (37.3 C) (07/17 0857) Pulse Rate:  [59-92] 69 (07/17 1000) Resp:  [18-24] 24 (07/17 1000) BP: (117-145)/(38-113) 134/50 mmHg (07/17 1000) SpO2:  [97 %-100 %] 99 % (07/17 1000) FiO2 (%):  [30 %-40 %] 30 % (07/17 1000) Weight:  [190 lb 11.2 oz (86.5 kg)] 190 lb 11.2 oz (86.5 kg) (07/17 0419) VENTILATOR SETTINGS: Vent Mode:  [-] PRVC FiO2 (%):  [30 %-40 %] 30 % Set Rate:  [14 bmp] 14 bmp Vt Set:  [600 mL] 600 mL PEEP:  [5 cmH20] 5 cmH20 Pressure Support:  [10 cmH20] 10 cmH20 Plateau Pressure:  [22 cmH20-27 cmH20] 26 cmH20 INTAKE / OUTPUT:  Intake/Output Summary (Last 24 hours) at 10/20/14 1040 Last data filed at 10/20/14 1000  Gross per 24 hour  Intake   2940 ml  Output   3570 ml  Net   -630 ml    PHYSICAL EXAMINATION: General: pleasant Neuro: follows  commands HEENT:  ETT in place Cardiovascular: regular Lungs: no wheeze Abdomen: soft, non tender Ext: no edema  LABS:  CBC  Recent Labs Lab 10/18/14 0248 10/19/14 0222 10/20/14 0253  WBC 11.5* 8.6 10.7*  HGB 9.0* 9.1* 9.2*  HCT 29.6* 29.8* 29.4*  PLT 220 219 235   BMET  Recent Labs Lab 10/18/14 0248 10/19/14 0222 10/20/14 0253  NA 143 144 142  K 3.7 3.7 3.8  CL 108 109 105  CO2 29 29 30   BUN 50* 49* 46*  CREATININE 1.45* 1.40* 1.29*  GLUCOSE 112* 120* 124*   Electrolytes  Recent Labs Lab 10/18/14 0248 10/18/14 0930 10/19/14 0222 10/20/14 0253  CALCIUM 7.8*  --  7.9* 8.0*  MG 2.0  --  2.1 1.9  PHOS 3.6 3.0 3.2  --    Liver Enzymes  Recent Labs Lab 10/16/14 0227 10/18/14 0248 10/19/14 0222  AST  --   --  27  ALT  --   --  39  ALKPHOS  --   --  60  BILITOT  --   --  0.3  ALBUMIN 1.8* 1.8* 1.9*   Cardiac Enzymes  Recent Labs Lab 10/17/14 1900 10/18/14 0930  TROPONINI 0.45* 0.40*   Glucose  Recent Labs Lab 10/19/14 1236 10/19/14 1641 10/19/14 1934 10/19/14 2342 10/20/14 0418 10/20/14 0858  GLUCAP 119* 126* 128* 114* 113* 114*    CXR: No results found.    ASSESSMENT /  PLAN: PULMONARY ETT 7/7 >>  A: Acute on chronic respiratory failure. Hx of COPD. P:   Pressure support wean as tolerated >> might be ready for extubation trial soon F/u CXR intermittently Continue scheduled BDs with pulmicort, duoneb  CARDIOVASCULAR A:  VF arrest. Hx of CAD, A fib, HTN, HLD, acute on chronic systolic heart failure. P:  Coreg, amiodarone per cardiology Defer decision for anti-coagulation to cardiology Continue lasix  RENAL A:   AKI >> resolved. CKD III - baseline Cr 1.61. Hypernatremia >> resolved. Hypophosphatemia >> resolved. P:   Monitor renal fx, urine outpt  GASTROINTESTINAL A:   GERD. Diarrhea >> C diff negative. P:   Protonix for SUP  HEMATOLOGIC A:   Anemia of critical illness. P:  F/u CBC SQ heparin for DVT  prevention  INFECTIOUS A:   Severe sepsis, CAP >> completed Abx 7/13. P:   Monitor clinically  ENDOCRINE A:   Hyperglycemia - controlled P:   SSI  NEUROLOGIC A:   Acute metabolic encephalopathy >> much improved. P:   Wean off precedex as tolerated  Updated pt's family at bedside.  CC time 35 minutes.  Coralyn Helling, MD [facility] Pulmonary/Critical Care 10/20/2014, 10:40 AM Pager:  [phone] After 3pm call: [phone]

## 2014-10-20 NOTE — Progress Notes (Signed)
   10/20/14 1900  Clinical Encounter Type  Visited With Patient and family together;Family;Health care provider  Visit Type Patient actively dying;Psychological support;Spiritual support  Referral From Nurse  Consult/Referral To Chaplain  Spiritual Encounters  Spiritual Needs Emotional;Prayer  Stress Factors  Family Stress Factors Loss  Ch called to see PT and family; PT transitioned to comfort care; PT was awake and responsive; Family emotionally preparing; Family considering talking with Palliative care for comfort care; PT was awake during visit and shook CH hand with strong grip; PT is aware of what is going on.  Family is very supportive; [facility]

## 2014-10-20 NOTE — Consult Note (Signed)
Reason for Consult:VF arrest  Referring Physician: Dr. Precious Gildingurner  Gryffin L Buick is an 81 y.o. male.   HPI: The patient is an 81 yo man who has a long h/o chronic systolic heart failure, an ischemic CM, COPD, atrial flutter and chronic renal insufficiency. He was admitted for an acute febrile illness superimposed on CHF and expereinced VDRF and underwent intubation. The patient sustained a VF arrest. I do not have the interogation of his device with me. He has a BiV PPM, not an ICD as was previously thought. He was successfully defibrillated. He has had a long h/o PVC's and was on amiodarone in the past but this was stopped due to elevated LFT's.   PMH: Past Medical History  Diagnosis Date  . Chronic systolic heart failure     a. Chronic LV systolic failure. b. Echo in Dec 2011 showed EF of 25% - f/u echo 02/2011: Mild LVH, EF 15%, posterior lateral akinesis, inferior akinesis, grade 1 diastolic dysfunction, mild LAE. c. s/p BiV pacemaker 06/2011.  [name]. Atrial flutter     a. Off amiodarone because of increased LFTs  . COPD (chronic obstructive pulmonary disease)     Chronic SOB, O2 dependent  . Myocardial infarction 1994  . Gastro - esophageal reflux   . Claudication   . BPH (benign prostatic hyperplasia)   . Coronary artery disease     a. Myocardial infarction in 1984,  99 and 07. b. H/o multiple RCA stents. c.  LHC 04/2011: without obstructive disease. oLM 25%, mLAD 30%, pCFX 25%, prox to mid RCA stent ok, dRCA 30% and 70%, pPDA 25%, mPL 25-30%, EF 25%.  [name]. Hypertension   . Atrial fibrillation     a. On Xarelto.  . Hemorrhoids   . Hyperlipidemia     statin intolerant 2/2 myalgias  . Hx of adenomatous colonic polyps   . Hx of colonoscopy   . Chronic renal insufficiency   . Biventricular cardiac pacemaker in situ 3.2013  . NSVT (nonsustained ventricular tachycardia)     a. 04/2012- 6 beats on tele.  [name]. Shortness of breath   . Arthritis     hips ,back ,hands  . Iron deficiency anemia,  unspecified 03/20/2013    PSHX: Past Surgical History  Procedure Laterality Date  . Cardiac catheterization  2008  . Cataract extraction    . Salivary gland surgery    . Coronary angioplasty with stent placement    . Insert / replace / remove pacemaker  06/16/2011  . Tee without cardioversion  05/08/2012    Procedure: TRANSESOPHAGEAL ECHOCARDIOGRAM (TEE);  Surgeon: Vesta MixerPhilip J Nahser, MD;  [facility];  Service: Cardiovascular;  Laterality: N/A;  . Bi-ventricular pacemaker insertion N/A 06/16/2011    Procedure: BI-VENTRICULAR PACEMAKER INSERTION (CRT-P);  Surgeon: Duke SalviaSteven C Klein, MD;  [facility];  Service: Cardiovascular;  Laterality: N/A;    FAMHX: Family History  Problem Relation Age of Onset  . Coronary artery disease Father   . Heart disease Mother   . Colon cancer Neg Hx   . Heart disease Brother     Social History:  reports that he quit smoking about 5 years ago. His smoking use included Cigarettes. He started smoking about 60 years ago. He has a 82.5 pack-year smoking history. He has never used smokeless tobacco. He reports that he does not drink alcohol or use illicit drugs.  Allergies:  Allergies  Allergen Reactions  . Codeine Nausea And Vomiting  . Inspra [Eplerenone] Other (See  Comments)    Stomach problems  . Spironolactone Other (See Comments)    Gynecomastia  . Atorvastatin Other (See Comments)    Arthritis/pain  . Crestor [Rosuvastatin] Other (See Comments)    Arthritis/pain ("couldn't hardly walk")  . Doxycycline Itching and Rash  . Simvastatin Other (See Comments)    Arthritis/pain  . Sulfonamide Derivatives Itching and Rash    Medications: I have reviewed the patient's current medications.  No results found.  ROS  As stated in the HPI and negative for all other systems.  Physical Exam  Vitals:Blood pressure 145/46, pulse 69, temperature 98.6 F (37 C), temperature source Oral, resp. rate 24, height  (1.803 m), weight 190  lb 11.2 oz (86.5 kg), SpO2 98 %.  Intubated but awake and no obvious distress HEENT: Endotracheal tube is in place Neck:  6 cm JVD, no thyromegally Back:  No CVA tenderness Lungs:  Clear, with no wheezes, rales, or rhonchi HEART:  Regular rate rhythm, no murmurs, no rubs, no clicks Abd:  Soft, positive bowel sounds, no organomegally, no rebound, no guarding Ext:  2 plus pulses, no edema, no cyanosis, no clubbing Skin:  No rashes no nodules Neuro:  CN II through XII intact, motor grossly intact  ECG - normal sinus rhythm with AV sequential pacing  Assessment/Plan: 1. Ventricular fibrillation cardiac arrest, status post defibrillation 2. Acute on chronic systolic heart failure 3. Ventilatory dependent respiratory failure 4. Chronic class IV renal insufficiency 5. Status post biventricular pacemaker Discussion: The patient has an indwelling biventricular pacemaker, not a biventricular ICD. He appears to be improving from a respiratory failure perspective. Hopefully he can be extubated. The patient is at risk for recurrent ventricular arrhythmias. However he has multiple comorbidities. Ideally we would prescribe him amiodarone and continue biventricular pacing. However upgrade to a biventricular ICD would be a consideration. I will defer the ultimate decision on this to his primary electrophysiologist Dr. Graciela Husbands. We do not have very good antiarrhythmic drug therapy to offer this patient in the setting of renal insufficiency and previous intolerance to amiodarone. Unfortunately, upgrade to a biventricular ICD would have significant risk.  Sharlot Gowda TaylorMD 10/20/2014, 8:22 AM

## 2014-10-20 NOTE — Progress Notes (Signed)
Utilization review completed.  

## 2014-10-20 NOTE — Progress Notes (Signed)
Spoke with pt's wife at bedside.  Explained events of the day with extubation, and now with need for BiPAP.  Explained that much of this is related to his chronic medical problems (severe COPD/emphysema, systolic heart failure).  She understands this, and confirmed that he would not want re-intubation and maintain DNR status.  Also, if he does not improve with BiPAP, then she would want to shift focus of care to ensure that he is comfortable.  Coralyn HellingVineet Arth Nicastro, MD [facility] 10/20/2014, 5:25 PM Pager:  [phone] After 3pm call: [phone]

## 2014-10-20 NOTE — Procedures (Signed)
Extubation Procedure Note  Patient Details:   Name: Candace Gallusdward L Swaney DOB: 05/01/1930 MRN: [id number]   Airway Documentation:     Evaluation  O2 sats: 92 Complications: none Patient tolerated procedure well. Bilateral Breath Sounds: Clear Suctioning: Airway Pt able to speak  Per CCM order, pt extubated and placed on nasal cannula.  Pt tolerated well, no complications.   Revonda HumphreyDePue, Zafar Debrosse S 10/20/2014, 11:43 AM

## 2014-10-20 NOTE — Progress Notes (Addendum)
eLink Physician-Brief Progress Note Patient Name: Andrew [name] DOB: 1930/05/17 MRN: [id number]   Date of Service  10/20/2014  HPI/Events of Note  I have spoken with the patient's wife, Andrew [name], who relates that her husband has severe chronic medical conditions related to his COPD and Systolic Heart Failure. She state's that the patient is "tired" and now desires comfort care only. She states that she and her daughters are here at the hospital and are in agreement with the patient's wishes. She states that some local family members are on the way to the hospital and that she would like the hospital chaplain paged.   eICU Interventions  Will withdraw BiPAP and other medical support when the family is ready.     Intervention Category Intermediate Interventions: Other:  Andrew [name],Andrew [name] 10/20/2014, 7:12 PM

## 2014-10-21 ENCOUNTER — Encounter: Payer: Self-pay | Admitting: Gastroenterology

## 2014-10-21 MED ORDER — ATROPINE SULFATE 1 % OP SOLN
2.0000 [drp] | Freq: Three times a day (TID) | OPHTHALMIC | Status: DC
  Filled 2014-10-21: qty 2

## 2014-10-22 ENCOUNTER — Telehealth: Payer: Self-pay

## 2014-10-22 NOTE — Telephone Encounter (Signed)
On 10/22/2014 I received a death certificate from [employer] [facility] [employer]. The death certificate is for cremation. The patient is a patient of Doctor Sood. The death certificate will be taken to Pulmonary Unit this pm for Doctor Craige CottaSood to sign the death certificate. On 10/23/2014 I received the death certificate back from Doctor AronaSood. I got the death certificate ready for pickup and called the funeral home to let them know it was ready for pickup. I also faxed them a copy per their request.

## 2014-11-04 NOTE — Progress Notes (Signed)
Asystole noted on monitor. Pt without respirations or pulse. Verified with RN Janice Coffinyesha Harvey. Cardiac time of death 10:25 am May 25, 2014. Pt not suitable for organ donation. Family present at time of death. Funeral home arrangements provided by wife. Bed control notified. Pt transported to morgue. No autopsy requested.

## 2014-11-04 NOTE — Progress Notes (Signed)
Patient/family desire for comfort care noted. Cardiology will sign off. Please call if needed.   Andrew Weyland SwazilandJordan MD, [facility]

## 2014-11-04 NOTE — Progress Notes (Signed)
Chaplain visited pt and family per referral from daytime chaplain. Pt's wife, brother, daughters, and grandson present. Pt alert and responsive; pt shook chaplain's hand. Pt's wife states she is at peace with pt's decision to move to comfort care. Per RN family has been sharing memories/stories with pt. Family seems to be in a good place. Pt's wife thanked Orthoptistchaplain for coming.

## 2014-11-04 NOTE — Discharge Summary (Signed)
Family Medicine Teaching [facility] Discharge Summary  Patient name: Andrew [name] Medical record number: [id number] Date of birth: 08/10/1930 Age: 81 y.o. Gender: male Date of Admission: 10/11/2014  Date of Discharge: 2014/11/05 Admitting Physician: Kalman Shan, MD  Primary Care Provider: Lucilla Edin, MD Consultants: CCM  Indication for Hospitalization: Fever, SOB, hypoxia  Discharge Diagnoses/Problem List:  AoC resp failure COPD CAD, AF AoC systolic HF AoCKD III Anemia of chronic disease  Disposition: Morgue  Discharge Condition: Deceased   Discharge Exam: (performed prior to his passing) General: pleasant Neuro: follows commands HEENT:ETT no longer in place, [name], MM tacky Cardiovascular: regular Lungs: no wheeze Abdomen: soft, non tender Ext: no edema  Brief Hospital Course:  Patient presented to ED w/ fever, SOB, and hypoxia in resp failure. Patient failed BiPAP and was intubated on 7/7. Patient was started on empiric abx (vanc/CTX/Azithro). Echo on day of intubation showed inf wall hypokinesis, LVH, and EF 45-50%. He was also noted to be hypernatremic. Patient's respiratory status responded well to the antibiotics and intubation. Serial CXRs began to show worsening pulmonary edema likely due to fluid overload. Diuresis was initiated. Patient's respiratory status was stable on vent w/ FiO2 40% and PEEP 5 for multiple days. Fluid overload caused stress on the heart which eventually led to patient flipping into VT. A code blue was called on 7/15. Patient received chest compressions, 1 defibrillation, amio, and epinephrine. Patient was later extubated on 7/17. Patient's status continued to deteriorate. Family did not want patient reintubated. DNR status was ordered, and eventually transition to comfort care. Patient passed away :25am on 2014-11-05. Family was present at the time.   Significant Labs and Imaging:   Recent Labs Lab 10/18/14 0248 10/19/14 0222  10/20/14 0253  WBC 11.5* 8.6 10.7*  HGB 9.0* 9.1* 9.2*  HCT 29.6* 29.8* 29.4*  PLT 220 219 235    Recent Labs Lab 10/16/14 0227  10/17/14 0238 10/17/14 1404 10/17/14 1900 10/18/14 0248 10/18/14 0930 10/19/14 0222 10/20/14 0253  NA 151*  < > 144 146* 144 143  --  144 142  K 4.2  < > 3.5 4.0 3.5 3.7  --  3.7 3.8  CL 118*  < > 110 114* 107 108  --  109 105  CO2 27  < > --  29 30  GLUCOSE 134*  < > 130* 132* 142* 112*  --  120* 124*  BUN 65*  < > 52* 49* 46* 50*  --  49* 46*  CREATININE 1.42*  < > 1.41* 1.25* 1.37* 1.45*  --  1.40* 1.29*  CALCIUM 8.1*  < > 7.9* 7.9* 8.2* 7.8*  --  7.9* 8.0*  MG 2.4  --   --   --   --  2.0  --  2.1 1.9  PHOS 1.8*  --  2.9  --   --  3.6 3.0 3.2  --   ALKPHOS  --   --   --   --   --   --   --  60  --   AST  --   --   --   --   --   --   --  27  --   ALT  --   --   --   --   --   --   --  39  --   ALBUMIN 1.8*  --   --   --   --  1.8*  --  1.9*  --   < > = values in this interval not displayed.  Medications:    Medication List    ASK your doctor about these medications        albuterol 108 (90 BASE) MCG/ACT inhaler  Commonly known as:  PROAIR HFA  Inhale 1 to 2 puffs everys 6 hours as needed     albuterol (5 MG/ML) 0.5% nebulizer solution  Commonly known as:  PROVENTIL  Take 0.5 mLs (2.5 mg total) by nebulization every 6 (six) hours as needed for wheezing or shortness of breath.     ALPRAZolam 0.25 MG tablet  Commonly known as:  XANAX  Take 0.25 mg by mouth daily as needed for anxiety.     azelastine 0.1 % nasal spray  Commonly known as:  ASTELIN  Place 1 spray into both nostrils 2 (two) times daily. Use in each nostril as directed     carvedilol 25 MG tablet  Commonly known as:  COREG  Take 1/2  tablet(12.5 mg) twice a day     esomeprazole 40 MG capsule  Commonly known as:  NEXIUM  TAKE 1 CAPSULE TWICE A DAY     Fluticasone-Salmeterol 250-50 MCG/DOSE Aepb  Commonly known as:  ADVAIR  Inhale 1 puff into the lungs  2 (two) times daily.     furosemide 80 MG tablet  Commonly known as:  LASIX  Take 1 tab by mouth  Daily (80 mg)  in AM     furosemide 40 MG tablet  Commonly known as:  LASIX  Take 40 mg by mouth in the pm.Take 1 extra tablet for weight greater than 180lb.     guaiFENesin 600 MG 12 hr tablet  Commonly known as:  MUCINEX  Take 1,200 mg by mouth 2 (two) times daily as needed for cough or to loosen phlegm.     HYDROcodone-acetaminophen 5-325 MG per tablet  Commonly known as:  NORCO/VICODIN  Take 1 tablet by mouth 2 (two) times daily. Arthritis pain     ipratropium-albuterol 0.5-2.5 (3) MG/3ML Soln  Commonly known as:  DUONEB  Take 3 mLs by nebulization 3 (three) times daily.     nitroGLYCERIN 0.4 MG SL tablet  Commonly known as:  NITROSTAT  Place 1 tablet (0.4 mg total) under the tongue every 5 (five) minutes as needed for chest pain.     OXYGEN  Inhale 2 L into the lungs as needed (for shrotness of breath).     potassium chloride SA 20 MEQ tablet  Commonly known as:  K-DUR,KLOR-CON  Take 1 tablet (20 mEq total) by mouth daily.     sacubitril-valsartan 24-26 MG  Commonly known as:  ENTRESTO  Take 1 tablet by mouth 2 (two) times daily.     tamsulosin 0.4 MG Caps capsule  Commonly known as:  FLOMAX  Take 0.4 mg by mouth 2 (two) times daily.     XARELTO 20 MG Tabs tablet  Generic drug:  rivaroxaban  TAKE 1 TABLET DAILY WITH SUPPER         Kathee DeltonIan D Rogers[place] Ducre, MD 2014/07/10, 3:30 PM PGY-2, [facility]

## 2014-11-04 DEATH — deceased

## 2014-12-29 ENCOUNTER — Other Ambulatory Visit: Payer: Self-pay | Admitting: Cardiology

## 2015-09-24 ENCOUNTER — Other Ambulatory Visit: Payer: Self-pay | Admitting: Nurse Practitioner

## 2016-07-11 IMAGING — CR DG CHEST 1V PORT
1 series · 1 of 1 positions shown · non-contrast
Comparison: Chest x-ray 09/21/2013.

CLINICAL DATA: Shortness of breath worsening over the past 3 days.
History of congestive heart failure and COPD.

EXAM:
PORTABLE CHEST - 1 VIEW

[AP]
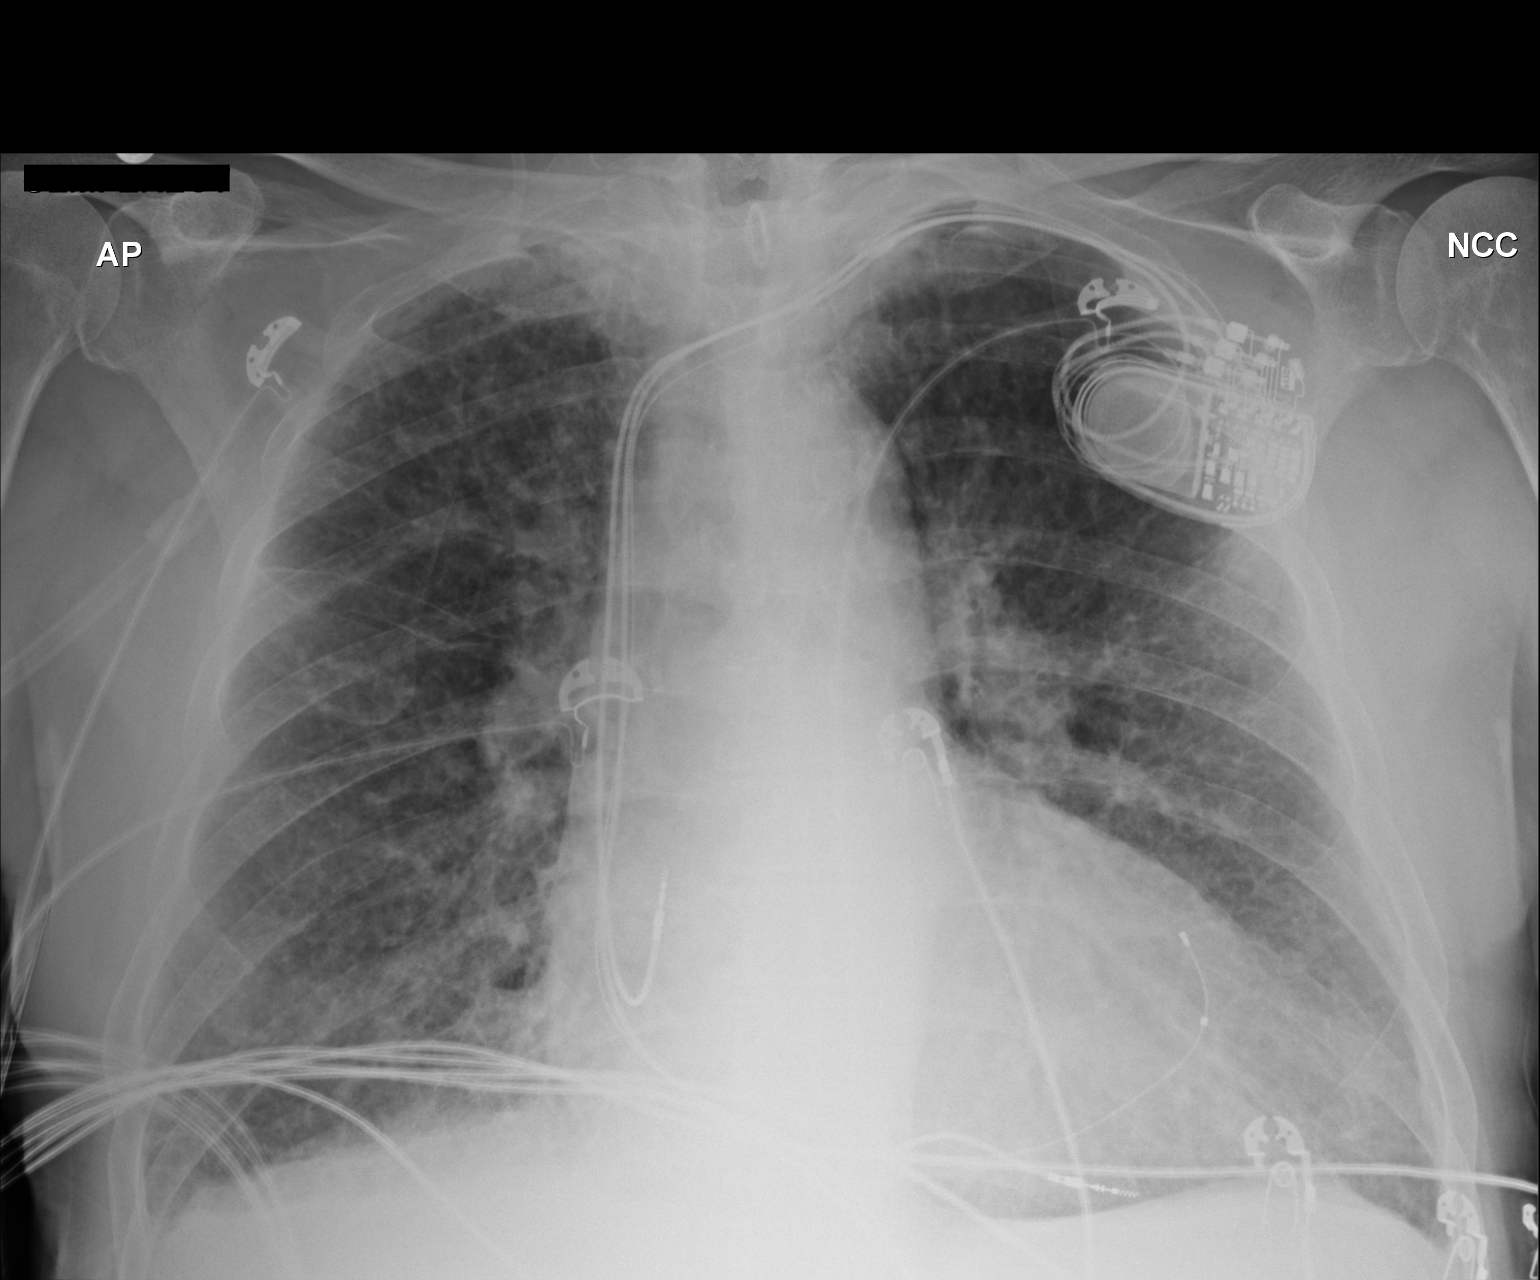

[1 of 1 positions shown; findings below may reference images not displayed]

FINDINGS: Diffuse patchy interstitial prominence and patchy areas of
peribronchial cuffing, most severe in the perihilar aspects of the
lungs and in the medial aspect of the right lung base. No confluent
consolidative airspace disease on this single view examination. No
pleural effusions. No evidence of pulmonary edema. Heart size is
borderline enlarged. Upper mediastinal contours are within normal
limits. Left-sided biventricular pacemaker device in position with
lead tips projecting over the expected location of the right atrium,
right ventricular apex and overlying the lateral wall of the left
ventricle via the coronary sinus and coronary veins.
IMPRESSION: 1. The appearance of the chest suggests severe bronchitis,
potentially with developing multilobar bronchopneumonia, as detailed
above.

## 2016-07-13 IMAGING — CR DG CHEST 2V
2 series · 2 of 2 positions shown · non-contrast
Comparison: Portable chest x-ray October 22, 2013 and September 21, 2013.

CLINICAL DATA: COPD with history of CHF ; pneumonia versus CHF now

EXAM:
CHEST  2 VIEW

[w chest pa]
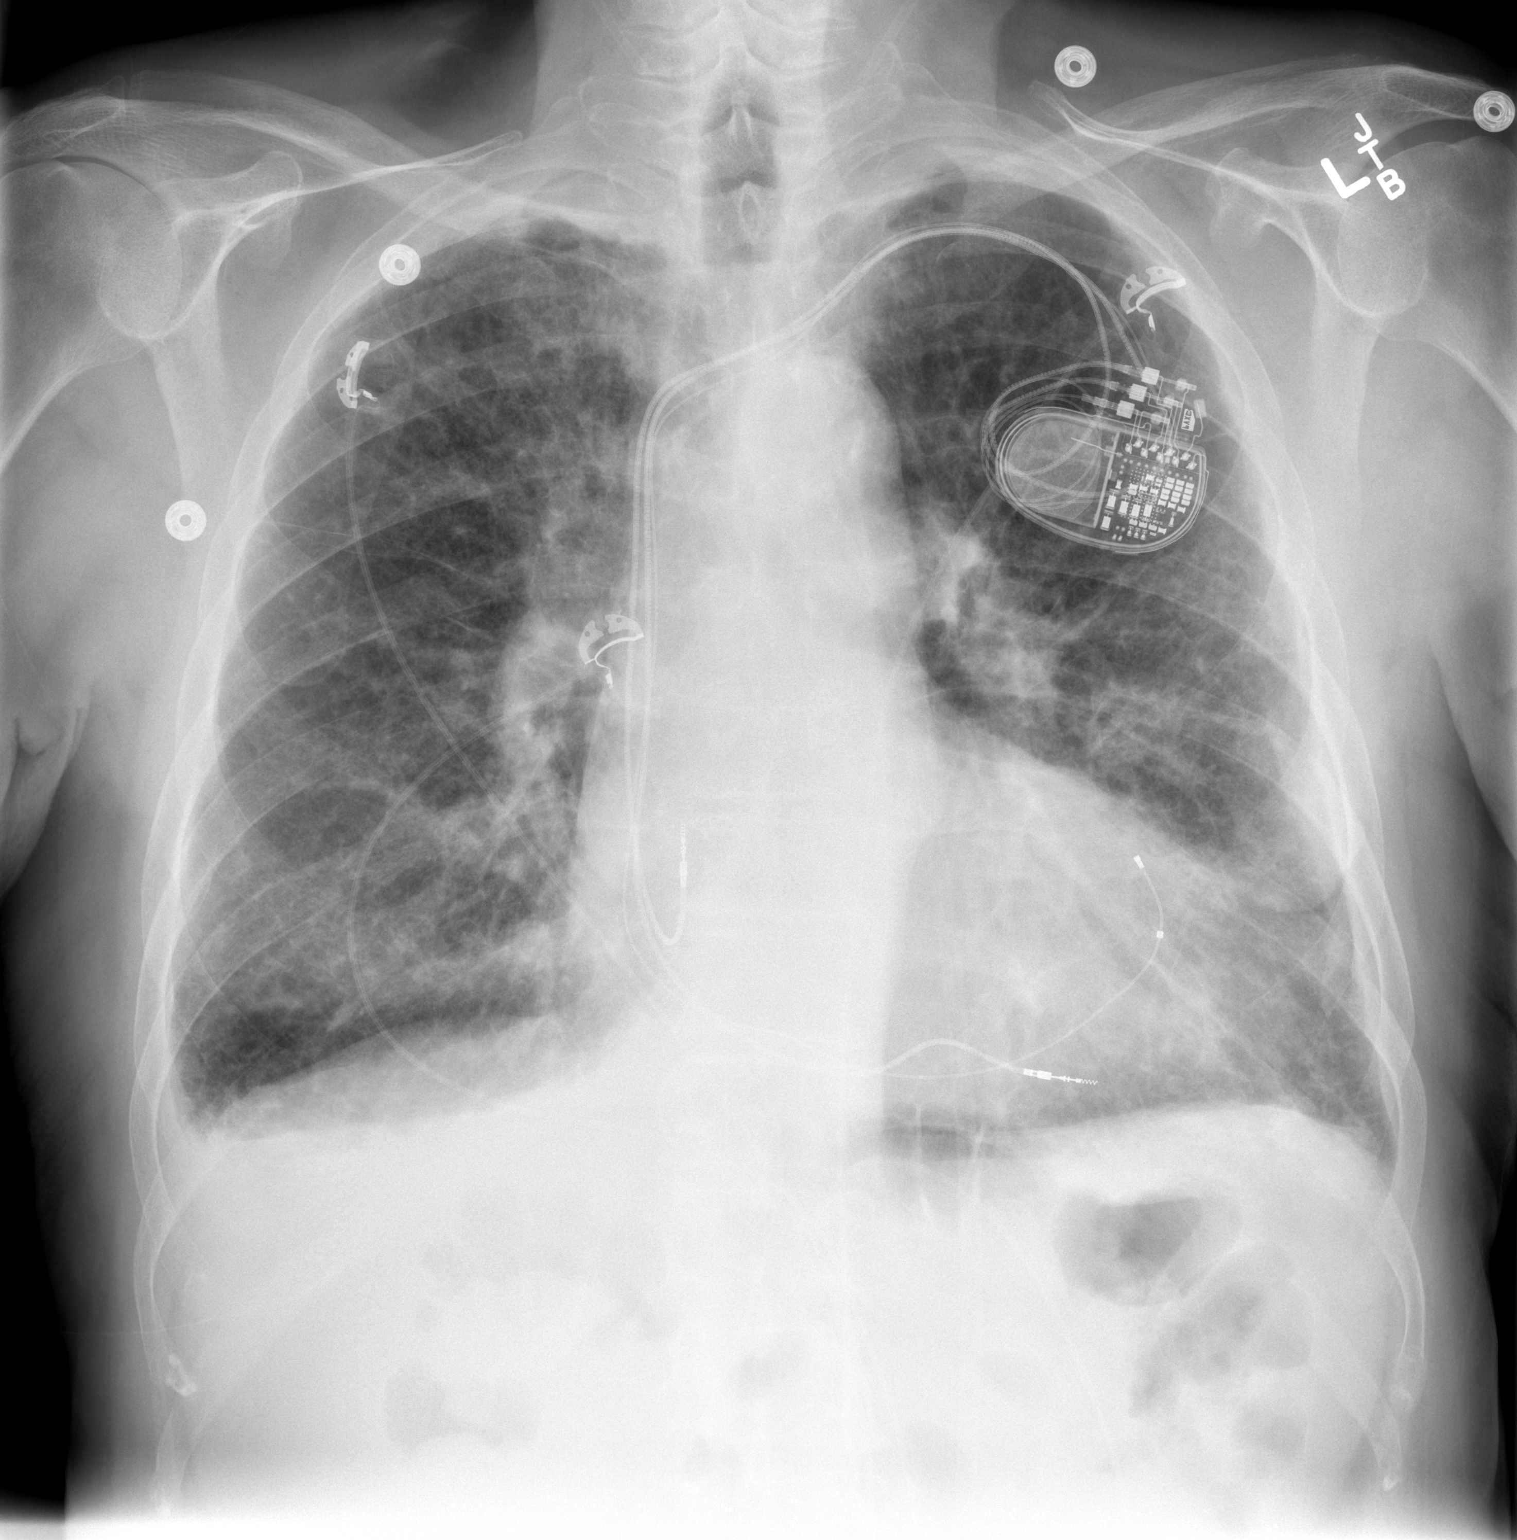

[w chest lat]
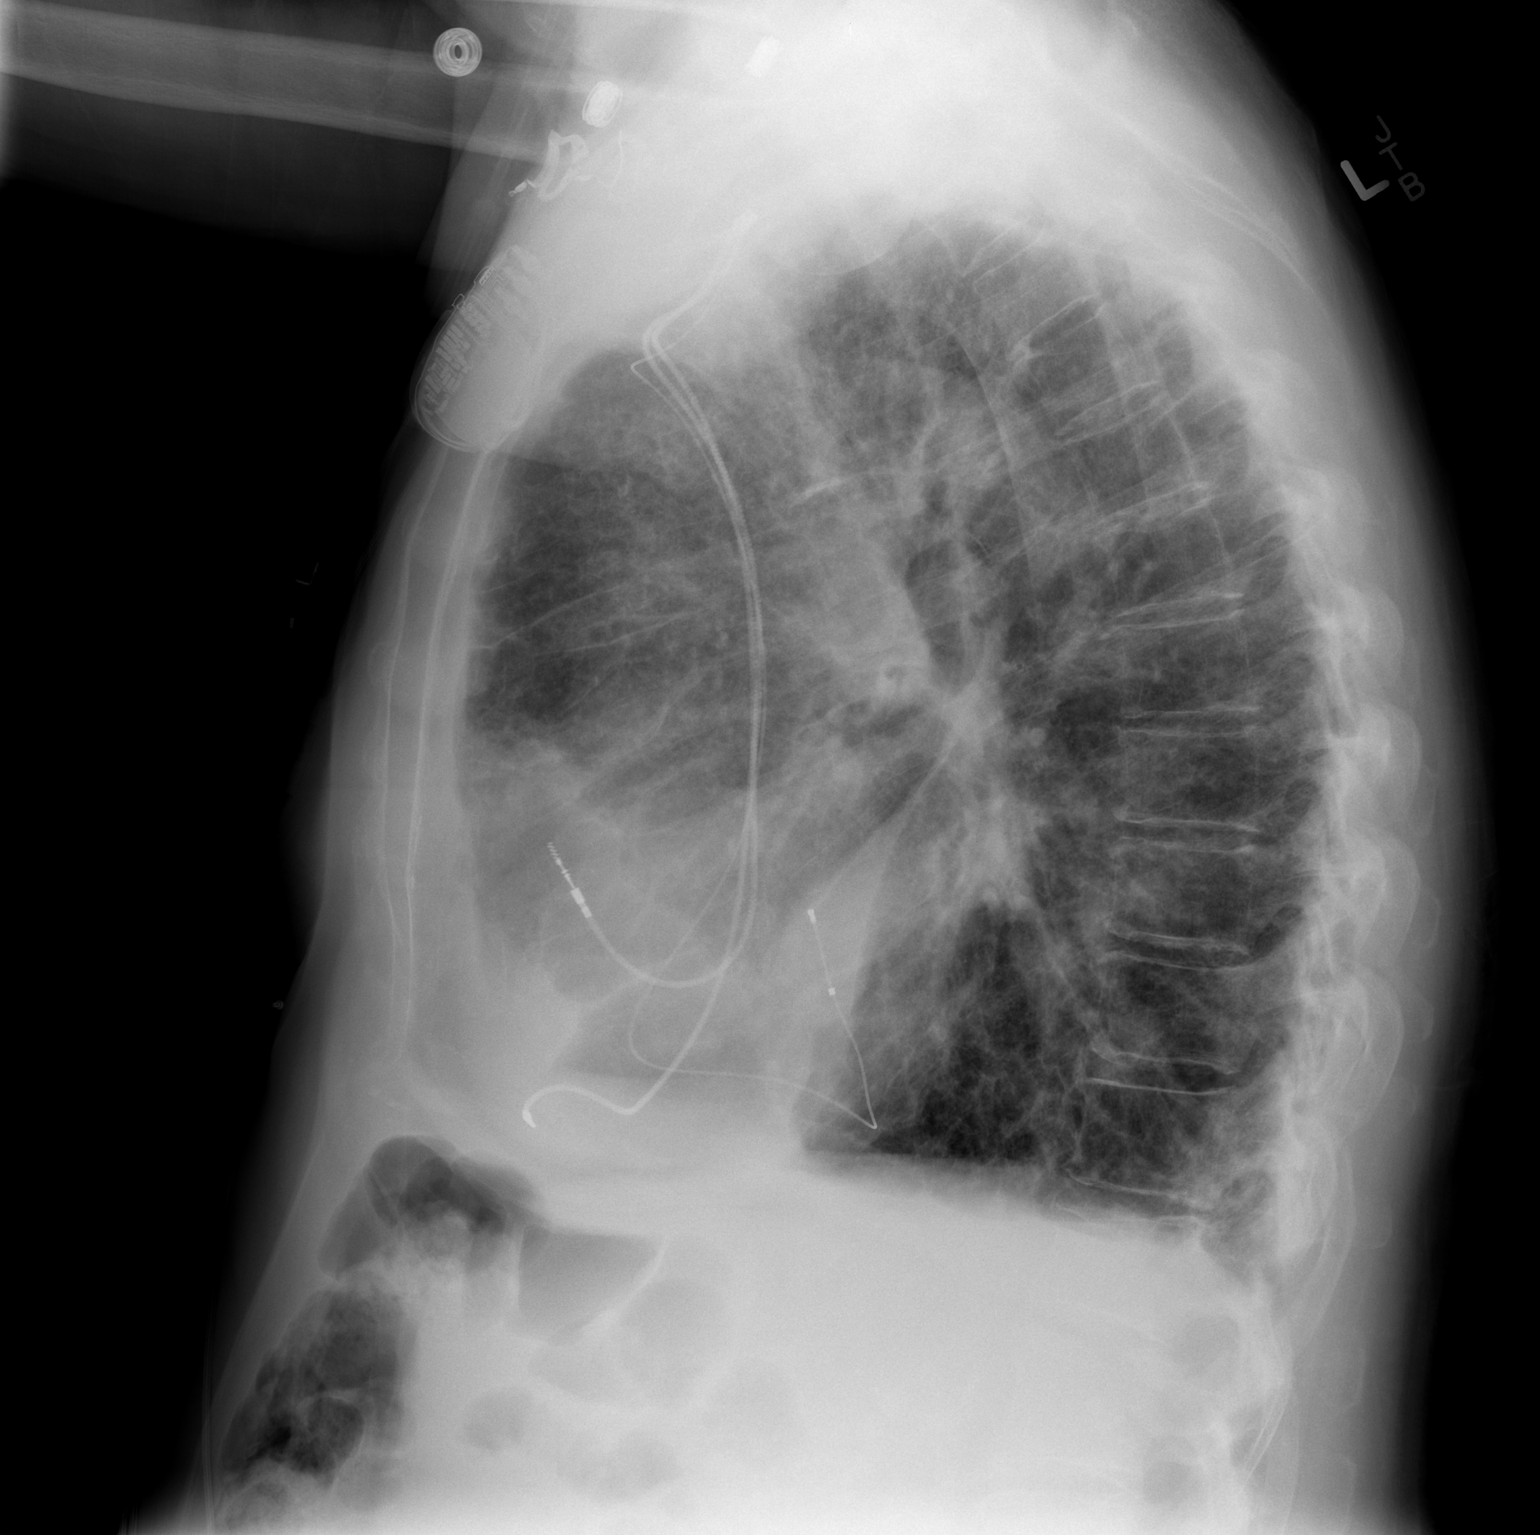

[2 of 2 positions shown; findings below may reference images not displayed]

FINDINGS: The lungs remain hyperinflated. When compared to the film [REDACTED] the interstitial markings are slightly less conspicuous today
on the right but fairly stable on the left. They remain increased as
compared to the [DATE] study. A small right pleural effusion
is present. The cardiac silhouette remains enlarged. The central
pulmonary vascularity is prominent. The permanent pacemaker is
unchanged.
IMPRESSION: COPD and parenchymal scarring with superimposed mild CHF.
